# Patient Record
Sex: Male | Born: 1945
Health system: Southern US, Community
[De-identification: ages and names within clinical notes are randomized; demographics above are authoritative.]

## PROBLEM LIST (undated history)

## (undated) DIAGNOSIS — I219 Acute myocardial infarction, unspecified: Secondary | ICD-10-CM

## (undated) DIAGNOSIS — E785 Hyperlipidemia, unspecified: Secondary | ICD-10-CM

## (undated) DIAGNOSIS — E119 Type 2 diabetes mellitus without complications: Secondary | ICD-10-CM

## (undated) DIAGNOSIS — I10 Essential (primary) hypertension: Secondary | ICD-10-CM

## (undated) DIAGNOSIS — M1990 Unspecified osteoarthritis, unspecified site: Secondary | ICD-10-CM

## (undated) DIAGNOSIS — F32A Depression, unspecified: Secondary | ICD-10-CM

## (undated) DIAGNOSIS — I2510 Atherosclerotic heart disease of native coronary artery without angina pectoris: Secondary | ICD-10-CM

## (undated) DIAGNOSIS — I213 ST elevation (STEMI) myocardial infarction of unspecified site: Secondary | ICD-10-CM

## (undated) HISTORY — PX: SQUAMOUS CELL CARCINOMA EXCISION: SHX2433

## (undated) HISTORY — PX: CARDIAC CATHETERIZATION: SHX172

## (undated) HISTORY — DX: ST elevation (STEMI) myocardial infarction of unspecified site: I21.3

## (undated) HISTORY — DX: Acute myocardial infarction, unspecified: I21.9

---

## 2010-01-04 ENCOUNTER — Ambulatory Visit: Payer: Self-pay | Admitting: Cardiology

## 2011-12-22 ENCOUNTER — Ambulatory Visit: Payer: Self-pay | Admitting: Cardiology

## 2011-12-22 LAB — CBC WITH DIFFERENTIAL/PLATELET
Basophil #: 0 10*3/uL (ref 0.0–0.1)
Basophil %: 0.2 %
Eosinophil %: 3.8 %
Lymphocyte #: 1.9 10*3/uL (ref 1.0–3.6)
Lymphocyte %: 40 %
MCH: 29.2 pg (ref 26.0–34.0)
MCHC: 32.8 g/dL (ref 32.0–36.0)
MCV: 89 fL (ref 80–100)
Neutrophil %: 47.4 %
Platelet: 184 10*3/uL (ref 150–440)
RBC: 4.51 10*6/uL (ref 4.40–5.90)
WBC: 4.6 10*3/uL (ref 3.8–10.6)

## 2011-12-22 LAB — BASIC METABOLIC PANEL
Anion Gap: 4 — ABNORMAL LOW (ref 7–16)
BUN: 16 mg/dL (ref 7–18)
Calcium, Total: 8.3 mg/dL — ABNORMAL LOW (ref 8.5–10.1)
Co2: 29 mmol/L (ref 21–32)
EGFR (African American): >60
EGFR (Non-African Amer.): >60
Potassium: 4.4 mmol/L (ref 3.5–5.1)
Sodium: 142 mmol/L (ref 136–145)

## 2014-04-10 DIAGNOSIS — E782 Mixed hyperlipidemia: Secondary | ICD-10-CM | POA: Insufficient documentation

## 2014-07-22 DIAGNOSIS — L4050 Arthropathic psoriasis, unspecified: Secondary | ICD-10-CM | POA: Diagnosis not present

## 2014-07-22 DIAGNOSIS — Z79899 Other long term (current) drug therapy: Secondary | ICD-10-CM | POA: Diagnosis not present

## 2014-10-09 DIAGNOSIS — I10 Essential (primary) hypertension: Secondary | ICD-10-CM | POA: Diagnosis not present

## 2014-10-09 DIAGNOSIS — E782 Mixed hyperlipidemia: Secondary | ICD-10-CM | POA: Diagnosis not present

## 2014-10-09 DIAGNOSIS — E119 Type 2 diabetes mellitus without complications: Secondary | ICD-10-CM | POA: Diagnosis not present

## 2014-10-09 DIAGNOSIS — Z125 Encounter for screening for malignant neoplasm of prostate: Secondary | ICD-10-CM | POA: Diagnosis not present

## 2014-10-09 DIAGNOSIS — I2510 Atherosclerotic heart disease of native coronary artery without angina pectoris: Secondary | ICD-10-CM | POA: Diagnosis not present

## 2014-10-14 DIAGNOSIS — L4050 Arthropathic psoriasis, unspecified: Secondary | ICD-10-CM | POA: Diagnosis not present

## 2014-10-14 DIAGNOSIS — Z79899 Other long term (current) drug therapy: Secondary | ICD-10-CM | POA: Diagnosis not present

## 2014-10-15 DIAGNOSIS — Z0000 Encounter for general adult medical examination without abnormal findings: Secondary | ICD-10-CM | POA: Diagnosis not present

## 2014-10-15 DIAGNOSIS — E119 Type 2 diabetes mellitus without complications: Secondary | ICD-10-CM | POA: Diagnosis not present

## 2014-10-15 DIAGNOSIS — E782 Mixed hyperlipidemia: Secondary | ICD-10-CM | POA: Diagnosis not present

## 2014-10-21 DIAGNOSIS — L4050 Arthropathic psoriasis, unspecified: Secondary | ICD-10-CM | POA: Diagnosis not present

## 2014-10-21 DIAGNOSIS — Z79899 Other long term (current) drug therapy: Secondary | ICD-10-CM | POA: Diagnosis not present

## 2014-12-02 DIAGNOSIS — D2262 Melanocytic nevi of left upper limb, including shoulder: Secondary | ICD-10-CM | POA: Diagnosis not present

## 2014-12-02 DIAGNOSIS — D225 Melanocytic nevi of trunk: Secondary | ICD-10-CM | POA: Diagnosis not present

## 2014-12-02 DIAGNOSIS — L570 Actinic keratosis: Secondary | ICD-10-CM | POA: Diagnosis not present

## 2014-12-02 DIAGNOSIS — B3742 Candidal balanitis: Secondary | ICD-10-CM | POA: Diagnosis not present

## 2014-12-02 DIAGNOSIS — X32XXXA Exposure to sunlight, initial encounter: Secondary | ICD-10-CM | POA: Diagnosis not present

## 2014-12-02 DIAGNOSIS — L408 Other psoriasis: Secondary | ICD-10-CM | POA: Diagnosis not present

## 2015-02-10 DIAGNOSIS — L4050 Arthropathic psoriasis, unspecified: Secondary | ICD-10-CM | POA: Diagnosis not present

## 2015-02-10 DIAGNOSIS — M545 Low back pain: Secondary | ICD-10-CM | POA: Diagnosis not present

## 2015-03-26 DIAGNOSIS — H524 Presbyopia: Secondary | ICD-10-CM | POA: Diagnosis not present

## 2015-03-26 DIAGNOSIS — E11329 Type 2 diabetes mellitus with mild nonproliferative diabetic retinopathy without macular edema: Secondary | ICD-10-CM | POA: Diagnosis not present

## 2015-03-26 DIAGNOSIS — H5210 Myopia, unspecified eye: Secondary | ICD-10-CM | POA: Diagnosis not present

## 2015-04-03 DIAGNOSIS — Z23 Encounter for immunization: Secondary | ICD-10-CM | POA: Diagnosis not present

## 2015-04-10 DIAGNOSIS — E119 Type 2 diabetes mellitus without complications: Secondary | ICD-10-CM | POA: Diagnosis not present

## 2015-04-10 DIAGNOSIS — Z0000 Encounter for general adult medical examination without abnormal findings: Secondary | ICD-10-CM | POA: Diagnosis not present

## 2015-04-10 DIAGNOSIS — E782 Mixed hyperlipidemia: Secondary | ICD-10-CM | POA: Diagnosis not present

## 2015-04-17 DIAGNOSIS — E119 Type 2 diabetes mellitus without complications: Secondary | ICD-10-CM | POA: Diagnosis not present

## 2015-04-17 DIAGNOSIS — I2510 Atherosclerotic heart disease of native coronary artery without angina pectoris: Secondary | ICD-10-CM | POA: Diagnosis not present

## 2015-04-17 DIAGNOSIS — E782 Mixed hyperlipidemia: Secondary | ICD-10-CM | POA: Diagnosis not present

## 2015-04-17 DIAGNOSIS — R5382 Chronic fatigue, unspecified: Secondary | ICD-10-CM | POA: Diagnosis not present

## 2015-04-17 DIAGNOSIS — E538 Deficiency of other specified B group vitamins: Secondary | ICD-10-CM | POA: Diagnosis not present

## 2015-06-15 DIAGNOSIS — E538 Deficiency of other specified B group vitamins: Secondary | ICD-10-CM | POA: Diagnosis not present

## 2015-06-15 DIAGNOSIS — Z8639 Personal history of other endocrine, nutritional and metabolic disease: Secondary | ICD-10-CM | POA: Diagnosis not present

## 2015-07-23 DIAGNOSIS — Z79899 Other long term (current) drug therapy: Secondary | ICD-10-CM | POA: Diagnosis not present

## 2015-07-23 DIAGNOSIS — M25531 Pain in right wrist: Secondary | ICD-10-CM | POA: Diagnosis not present

## 2015-07-23 DIAGNOSIS — M19031 Primary osteoarthritis, right wrist: Secondary | ICD-10-CM | POA: Diagnosis not present

## 2015-07-23 DIAGNOSIS — L4050 Arthropathic psoriasis, unspecified: Secondary | ICD-10-CM | POA: Diagnosis not present

## 2015-08-20 DIAGNOSIS — Z79899 Other long term (current) drug therapy: Secondary | ICD-10-CM | POA: Diagnosis not present

## 2015-08-20 DIAGNOSIS — L4050 Arthropathic psoriasis, unspecified: Secondary | ICD-10-CM | POA: Diagnosis not present

## 2015-08-24 DIAGNOSIS — G8929 Other chronic pain: Secondary | ICD-10-CM | POA: Diagnosis not present

## 2015-08-24 DIAGNOSIS — L4050 Arthropathic psoriasis, unspecified: Secondary | ICD-10-CM | POA: Diagnosis not present

## 2015-08-24 DIAGNOSIS — M25512 Pain in left shoulder: Secondary | ICD-10-CM | POA: Diagnosis not present

## 2015-08-24 DIAGNOSIS — M79641 Pain in right hand: Secondary | ICD-10-CM | POA: Diagnosis not present

## 2015-08-24 DIAGNOSIS — Z79899 Other long term (current) drug therapy: Secondary | ICD-10-CM | POA: Diagnosis not present

## 2015-10-12 DIAGNOSIS — Z125 Encounter for screening for malignant neoplasm of prostate: Secondary | ICD-10-CM | POA: Diagnosis not present

## 2015-10-12 DIAGNOSIS — E119 Type 2 diabetes mellitus without complications: Secondary | ICD-10-CM | POA: Diagnosis not present

## 2015-10-12 DIAGNOSIS — E538 Deficiency of other specified B group vitamins: Secondary | ICD-10-CM | POA: Diagnosis not present

## 2015-10-12 DIAGNOSIS — E782 Mixed hyperlipidemia: Secondary | ICD-10-CM | POA: Diagnosis not present

## 2015-10-19 DIAGNOSIS — F3341 Major depressive disorder, recurrent, in partial remission: Secondary | ICD-10-CM | POA: Diagnosis not present

## 2015-10-19 DIAGNOSIS — E538 Deficiency of other specified B group vitamins: Secondary | ICD-10-CM | POA: Insufficient documentation

## 2015-10-19 DIAGNOSIS — E782 Mixed hyperlipidemia: Secondary | ICD-10-CM | POA: Diagnosis not present

## 2015-10-19 DIAGNOSIS — E119 Type 2 diabetes mellitus without complications: Secondary | ICD-10-CM | POA: Diagnosis not present

## 2015-10-19 DIAGNOSIS — Z0000 Encounter for general adult medical examination without abnormal findings: Secondary | ICD-10-CM | POA: Diagnosis not present

## 2015-11-16 DIAGNOSIS — Z79899 Other long term (current) drug therapy: Secondary | ICD-10-CM | POA: Diagnosis not present

## 2015-11-16 DIAGNOSIS — L4050 Arthropathic psoriasis, unspecified: Secondary | ICD-10-CM | POA: Diagnosis not present

## 2015-12-01 DIAGNOSIS — D485 Neoplasm of uncertain behavior of skin: Secondary | ICD-10-CM | POA: Diagnosis not present

## 2015-12-01 DIAGNOSIS — D225 Melanocytic nevi of trunk: Secondary | ICD-10-CM | POA: Diagnosis not present

## 2015-12-01 DIAGNOSIS — D2262 Melanocytic nevi of left upper limb, including shoulder: Secondary | ICD-10-CM | POA: Diagnosis not present

## 2015-12-01 DIAGNOSIS — D2271 Melanocytic nevi of right lower limb, including hip: Secondary | ICD-10-CM | POA: Diagnosis not present

## 2015-12-01 DIAGNOSIS — D2272 Melanocytic nevi of left lower limb, including hip: Secondary | ICD-10-CM | POA: Diagnosis not present

## 2015-12-01 DIAGNOSIS — X32XXXA Exposure to sunlight, initial encounter: Secondary | ICD-10-CM | POA: Diagnosis not present

## 2015-12-01 DIAGNOSIS — L570 Actinic keratosis: Secondary | ICD-10-CM | POA: Diagnosis not present

## 2015-12-01 DIAGNOSIS — D0421 Carcinoma in situ of skin of right ear and external auricular canal: Secondary | ICD-10-CM | POA: Diagnosis not present

## 2015-12-14 DIAGNOSIS — L570 Actinic keratosis: Secondary | ICD-10-CM | POA: Diagnosis not present

## 2015-12-14 DIAGNOSIS — D0421 Carcinoma in situ of skin of right ear and external auricular canal: Secondary | ICD-10-CM | POA: Diagnosis not present

## 2016-02-15 DIAGNOSIS — L4050 Arthropathic psoriasis, unspecified: Secondary | ICD-10-CM | POA: Diagnosis not present

## 2016-02-15 DIAGNOSIS — Z79899 Other long term (current) drug therapy: Secondary | ICD-10-CM | POA: Diagnosis not present

## 2016-02-22 DIAGNOSIS — G8929 Other chronic pain: Secondary | ICD-10-CM | POA: Diagnosis not present

## 2016-02-22 DIAGNOSIS — M545 Low back pain: Secondary | ICD-10-CM | POA: Diagnosis not present

## 2016-02-22 DIAGNOSIS — L4050 Arthropathic psoriasis, unspecified: Secondary | ICD-10-CM | POA: Diagnosis not present

## 2016-04-12 DIAGNOSIS — E119 Type 2 diabetes mellitus without complications: Secondary | ICD-10-CM | POA: Diagnosis not present

## 2016-04-12 DIAGNOSIS — E782 Mixed hyperlipidemia: Secondary | ICD-10-CM | POA: Diagnosis not present

## 2016-04-12 DIAGNOSIS — Z0000 Encounter for general adult medical examination without abnormal findings: Secondary | ICD-10-CM | POA: Diagnosis not present

## 2016-04-12 DIAGNOSIS — E538 Deficiency of other specified B group vitamins: Secondary | ICD-10-CM | POA: Diagnosis not present

## 2016-04-14 DIAGNOSIS — X32XXXA Exposure to sunlight, initial encounter: Secondary | ICD-10-CM | POA: Diagnosis not present

## 2016-04-14 DIAGNOSIS — D225 Melanocytic nevi of trunk: Secondary | ICD-10-CM | POA: Diagnosis not present

## 2016-04-14 DIAGNOSIS — L923 Foreign body granuloma of the skin and subcutaneous tissue: Secondary | ICD-10-CM | POA: Diagnosis not present

## 2016-04-14 DIAGNOSIS — Z85828 Personal history of other malignant neoplasm of skin: Secondary | ICD-10-CM | POA: Diagnosis not present

## 2016-04-14 DIAGNOSIS — D2261 Melanocytic nevi of right upper limb, including shoulder: Secondary | ICD-10-CM | POA: Diagnosis not present

## 2016-04-14 DIAGNOSIS — L570 Actinic keratosis: Secondary | ICD-10-CM | POA: Diagnosis not present

## 2016-04-19 DIAGNOSIS — E119 Type 2 diabetes mellitus without complications: Secondary | ICD-10-CM | POA: Diagnosis not present

## 2016-04-19 DIAGNOSIS — Z23 Encounter for immunization: Secondary | ICD-10-CM | POA: Diagnosis not present

## 2016-05-10 DIAGNOSIS — L4050 Arthropathic psoriasis, unspecified: Secondary | ICD-10-CM | POA: Diagnosis not present

## 2016-08-05 DIAGNOSIS — J40 Bronchitis, not specified as acute or chronic: Secondary | ICD-10-CM | POA: Diagnosis not present

## 2016-08-09 DIAGNOSIS — L4050 Arthropathic psoriasis, unspecified: Secondary | ICD-10-CM | POA: Diagnosis not present

## 2016-08-24 DIAGNOSIS — Z79899 Other long term (current) drug therapy: Secondary | ICD-10-CM | POA: Diagnosis not present

## 2016-08-24 DIAGNOSIS — G8929 Other chronic pain: Secondary | ICD-10-CM | POA: Diagnosis not present

## 2016-08-24 DIAGNOSIS — L4050 Arthropathic psoriasis, unspecified: Secondary | ICD-10-CM | POA: Diagnosis not present

## 2016-08-24 DIAGNOSIS — M545 Low back pain: Secondary | ICD-10-CM | POA: Diagnosis not present

## 2016-10-20 DIAGNOSIS — E119 Type 2 diabetes mellitus without complications: Secondary | ICD-10-CM | POA: Diagnosis not present

## 2016-10-20 DIAGNOSIS — Z125 Encounter for screening for malignant neoplasm of prostate: Secondary | ICD-10-CM | POA: Diagnosis not present

## 2016-10-21 DIAGNOSIS — D2261 Melanocytic nevi of right upper limb, including shoulder: Secondary | ICD-10-CM | POA: Diagnosis not present

## 2016-10-21 DIAGNOSIS — Z85828 Personal history of other malignant neoplasm of skin: Secondary | ICD-10-CM | POA: Diagnosis not present

## 2016-10-21 DIAGNOSIS — B079 Viral wart, unspecified: Secondary | ICD-10-CM | POA: Diagnosis not present

## 2016-10-21 DIAGNOSIS — D2272 Melanocytic nevi of left lower limb, including hip: Secondary | ICD-10-CM | POA: Diagnosis not present

## 2016-10-21 DIAGNOSIS — X32XXXA Exposure to sunlight, initial encounter: Secondary | ICD-10-CM | POA: Diagnosis not present

## 2016-10-21 DIAGNOSIS — L570 Actinic keratosis: Secondary | ICD-10-CM | POA: Diagnosis not present

## 2016-10-21 DIAGNOSIS — D485 Neoplasm of uncertain behavior of skin: Secondary | ICD-10-CM | POA: Diagnosis not present

## 2016-10-21 DIAGNOSIS — D225 Melanocytic nevi of trunk: Secondary | ICD-10-CM | POA: Diagnosis not present

## 2016-10-27 DIAGNOSIS — E538 Deficiency of other specified B group vitamins: Secondary | ICD-10-CM | POA: Diagnosis not present

## 2016-10-27 DIAGNOSIS — E782 Mixed hyperlipidemia: Secondary | ICD-10-CM | POA: Diagnosis not present

## 2016-10-27 DIAGNOSIS — F3341 Major depressive disorder, recurrent, in partial remission: Secondary | ICD-10-CM | POA: Diagnosis not present

## 2016-10-27 DIAGNOSIS — E119 Type 2 diabetes mellitus without complications: Secondary | ICD-10-CM | POA: Diagnosis not present

## 2016-10-27 DIAGNOSIS — Z0000 Encounter for general adult medical examination without abnormal findings: Secondary | ICD-10-CM | POA: Diagnosis not present

## 2016-11-30 DIAGNOSIS — Z79899 Other long term (current) drug therapy: Secondary | ICD-10-CM | POA: Diagnosis not present

## 2016-11-30 DIAGNOSIS — L4050 Arthropathic psoriasis, unspecified: Secondary | ICD-10-CM | POA: Diagnosis not present

## 2017-02-23 DIAGNOSIS — Z79899 Other long term (current) drug therapy: Secondary | ICD-10-CM | POA: Diagnosis not present

## 2017-02-23 DIAGNOSIS — L4050 Arthropathic psoriasis, unspecified: Secondary | ICD-10-CM | POA: Diagnosis not present

## 2017-04-25 DIAGNOSIS — E119 Type 2 diabetes mellitus without complications: Secondary | ICD-10-CM | POA: Diagnosis not present

## 2017-04-25 DIAGNOSIS — E538 Deficiency of other specified B group vitamins: Secondary | ICD-10-CM | POA: Diagnosis not present

## 2017-04-25 DIAGNOSIS — E782 Mixed hyperlipidemia: Secondary | ICD-10-CM | POA: Diagnosis not present

## 2017-05-02 DIAGNOSIS — E538 Deficiency of other specified B group vitamins: Secondary | ICD-10-CM | POA: Diagnosis not present

## 2017-05-02 DIAGNOSIS — Z23 Encounter for immunization: Secondary | ICD-10-CM | POA: Diagnosis not present

## 2017-05-02 DIAGNOSIS — E782 Mixed hyperlipidemia: Secondary | ICD-10-CM | POA: Diagnosis not present

## 2017-05-02 DIAGNOSIS — E113393 Type 2 diabetes mellitus with moderate nonproliferative diabetic retinopathy without macular edema, bilateral: Secondary | ICD-10-CM | POA: Diagnosis not present

## 2017-05-02 DIAGNOSIS — H524 Presbyopia: Secondary | ICD-10-CM | POA: Diagnosis not present

## 2017-05-02 DIAGNOSIS — Z125 Encounter for screening for malignant neoplasm of prostate: Secondary | ICD-10-CM | POA: Diagnosis not present

## 2017-05-02 DIAGNOSIS — E1169 Type 2 diabetes mellitus with other specified complication: Secondary | ICD-10-CM | POA: Diagnosis not present

## 2017-05-23 DIAGNOSIS — L4050 Arthropathic psoriasis, unspecified: Secondary | ICD-10-CM | POA: Diagnosis not present

## 2017-05-23 DIAGNOSIS — Z79899 Other long term (current) drug therapy: Secondary | ICD-10-CM | POA: Diagnosis not present

## 2017-05-31 DIAGNOSIS — M150 Primary generalized (osteo)arthritis: Secondary | ICD-10-CM | POA: Diagnosis not present

## 2017-05-31 DIAGNOSIS — Z79899 Other long term (current) drug therapy: Secondary | ICD-10-CM | POA: Diagnosis not present

## 2017-05-31 DIAGNOSIS — L4050 Arthropathic psoriasis, unspecified: Secondary | ICD-10-CM | POA: Diagnosis not present

## 2017-07-21 DIAGNOSIS — Z85828 Personal history of other malignant neoplasm of skin: Secondary | ICD-10-CM | POA: Diagnosis not present

## 2017-07-21 DIAGNOSIS — Z08 Encounter for follow-up examination after completed treatment for malignant neoplasm: Secondary | ICD-10-CM | POA: Diagnosis not present

## 2017-07-21 DIAGNOSIS — X32XXXA Exposure to sunlight, initial encounter: Secondary | ICD-10-CM | POA: Diagnosis not present

## 2017-07-21 DIAGNOSIS — L570 Actinic keratosis: Secondary | ICD-10-CM | POA: Diagnosis not present

## 2017-09-04 DIAGNOSIS — M150 Primary generalized (osteo)arthritis: Secondary | ICD-10-CM | POA: Diagnosis not present

## 2017-09-04 DIAGNOSIS — L4050 Arthropathic psoriasis, unspecified: Secondary | ICD-10-CM | POA: Diagnosis not present

## 2017-09-04 DIAGNOSIS — Z79899 Other long term (current) drug therapy: Secondary | ICD-10-CM | POA: Diagnosis not present

## 2017-10-24 DIAGNOSIS — E782 Mixed hyperlipidemia: Secondary | ICD-10-CM | POA: Diagnosis not present

## 2017-10-24 DIAGNOSIS — Z125 Encounter for screening for malignant neoplasm of prostate: Secondary | ICD-10-CM | POA: Diagnosis not present

## 2017-10-24 DIAGNOSIS — E538 Deficiency of other specified B group vitamins: Secondary | ICD-10-CM | POA: Diagnosis not present

## 2017-10-24 DIAGNOSIS — E1169 Type 2 diabetes mellitus with other specified complication: Secondary | ICD-10-CM | POA: Diagnosis not present

## 2017-10-31 DIAGNOSIS — F3341 Major depressive disorder, recurrent, in partial remission: Secondary | ICD-10-CM | POA: Diagnosis not present

## 2017-10-31 DIAGNOSIS — E538 Deficiency of other specified B group vitamins: Secondary | ICD-10-CM | POA: Diagnosis not present

## 2017-10-31 DIAGNOSIS — E782 Mixed hyperlipidemia: Secondary | ICD-10-CM | POA: Diagnosis not present

## 2017-10-31 DIAGNOSIS — Z0000 Encounter for general adult medical examination without abnormal findings: Secondary | ICD-10-CM | POA: Diagnosis not present

## 2017-10-31 DIAGNOSIS — L4050 Arthropathic psoriasis, unspecified: Secondary | ICD-10-CM | POA: Diagnosis not present

## 2017-10-31 DIAGNOSIS — E1169 Type 2 diabetes mellitus with other specified complication: Secondary | ICD-10-CM | POA: Diagnosis not present

## 2017-11-22 DIAGNOSIS — M150 Primary generalized (osteo)arthritis: Secondary | ICD-10-CM | POA: Diagnosis not present

## 2017-11-22 DIAGNOSIS — Z79899 Other long term (current) drug therapy: Secondary | ICD-10-CM | POA: Diagnosis not present

## 2017-11-22 DIAGNOSIS — L4050 Arthropathic psoriasis, unspecified: Secondary | ICD-10-CM | POA: Diagnosis not present

## 2017-11-29 DIAGNOSIS — M150 Primary generalized (osteo)arthritis: Secondary | ICD-10-CM | POA: Diagnosis not present

## 2017-11-29 DIAGNOSIS — L4050 Arthropathic psoriasis, unspecified: Secondary | ICD-10-CM | POA: Diagnosis not present

## 2017-11-29 DIAGNOSIS — Z79899 Other long term (current) drug therapy: Secondary | ICD-10-CM | POA: Diagnosis not present

## 2018-02-03 DIAGNOSIS — M1711 Unilateral primary osteoarthritis, right knee: Secondary | ICD-10-CM | POA: Diagnosis not present

## 2018-03-01 DIAGNOSIS — Z79899 Other long term (current) drug therapy: Secondary | ICD-10-CM | POA: Diagnosis not present

## 2018-03-01 DIAGNOSIS — L4050 Arthropathic psoriasis, unspecified: Secondary | ICD-10-CM | POA: Diagnosis not present

## 2018-04-26 DIAGNOSIS — Z23 Encounter for immunization: Secondary | ICD-10-CM | POA: Diagnosis not present

## 2018-04-26 DIAGNOSIS — E1169 Type 2 diabetes mellitus with other specified complication: Secondary | ICD-10-CM | POA: Diagnosis not present

## 2018-04-26 DIAGNOSIS — E538 Deficiency of other specified B group vitamins: Secondary | ICD-10-CM | POA: Diagnosis not present

## 2018-04-26 DIAGNOSIS — E782 Mixed hyperlipidemia: Secondary | ICD-10-CM | POA: Diagnosis not present

## 2018-05-03 DIAGNOSIS — E1151 Type 2 diabetes mellitus with diabetic peripheral angiopathy without gangrene: Secondary | ICD-10-CM | POA: Insufficient documentation

## 2018-05-03 DIAGNOSIS — Z125 Encounter for screening for malignant neoplasm of prostate: Secondary | ICD-10-CM | POA: Diagnosis not present

## 2018-05-03 DIAGNOSIS — E538 Deficiency of other specified B group vitamins: Secondary | ICD-10-CM | POA: Diagnosis not present

## 2018-05-03 DIAGNOSIS — E782 Mixed hyperlipidemia: Secondary | ICD-10-CM | POA: Diagnosis not present

## 2018-05-31 DIAGNOSIS — Z79899 Other long term (current) drug therapy: Secondary | ICD-10-CM | POA: Diagnosis not present

## 2018-05-31 DIAGNOSIS — L4050 Arthropathic psoriasis, unspecified: Secondary | ICD-10-CM | POA: Diagnosis not present

## 2018-07-20 DIAGNOSIS — D2262 Melanocytic nevi of left upper limb, including shoulder: Secondary | ICD-10-CM | POA: Diagnosis not present

## 2018-07-20 DIAGNOSIS — D485 Neoplasm of uncertain behavior of skin: Secondary | ICD-10-CM | POA: Diagnosis not present

## 2018-07-20 DIAGNOSIS — D225 Melanocytic nevi of trunk: Secondary | ICD-10-CM | POA: Diagnosis not present

## 2018-07-20 DIAGNOSIS — D2271 Melanocytic nevi of right lower limb, including hip: Secondary | ICD-10-CM | POA: Diagnosis not present

## 2018-07-20 DIAGNOSIS — Z85828 Personal history of other malignant neoplasm of skin: Secondary | ICD-10-CM | POA: Diagnosis not present

## 2018-07-20 DIAGNOSIS — D0439 Carcinoma in situ of skin of other parts of face: Secondary | ICD-10-CM | POA: Diagnosis not present

## 2018-07-20 DIAGNOSIS — Z08 Encounter for follow-up examination after completed treatment for malignant neoplasm: Secondary | ICD-10-CM | POA: Diagnosis not present

## 2018-07-20 DIAGNOSIS — D2261 Melanocytic nevi of right upper limb, including shoulder: Secondary | ICD-10-CM | POA: Diagnosis not present

## 2018-07-20 DIAGNOSIS — D2272 Melanocytic nevi of left lower limb, including hip: Secondary | ICD-10-CM | POA: Diagnosis not present

## 2018-07-20 DIAGNOSIS — L570 Actinic keratosis: Secondary | ICD-10-CM | POA: Diagnosis not present

## 2018-07-30 DIAGNOSIS — D0439 Carcinoma in situ of skin of other parts of face: Secondary | ICD-10-CM | POA: Diagnosis not present

## 2018-07-30 DIAGNOSIS — C4442 Squamous cell carcinoma of skin of scalp and neck: Secondary | ICD-10-CM | POA: Diagnosis not present

## 2018-08-30 DIAGNOSIS — Z79899 Other long term (current) drug therapy: Secondary | ICD-10-CM | POA: Diagnosis not present

## 2018-08-30 DIAGNOSIS — L4050 Arthropathic psoriasis, unspecified: Secondary | ICD-10-CM | POA: Diagnosis not present

## 2018-09-06 DIAGNOSIS — Z79899 Other long term (current) drug therapy: Secondary | ICD-10-CM | POA: Diagnosis not present

## 2018-09-06 DIAGNOSIS — M150 Primary generalized (osteo)arthritis: Secondary | ICD-10-CM | POA: Diagnosis not present

## 2018-09-06 DIAGNOSIS — L4050 Arthropathic psoriasis, unspecified: Secondary | ICD-10-CM | POA: Diagnosis not present

## 2018-10-29 DIAGNOSIS — E538 Deficiency of other specified B group vitamins: Secondary | ICD-10-CM | POA: Diagnosis not present

## 2018-10-29 DIAGNOSIS — Z125 Encounter for screening for malignant neoplasm of prostate: Secondary | ICD-10-CM | POA: Diagnosis not present

## 2018-10-29 DIAGNOSIS — E1151 Type 2 diabetes mellitus with diabetic peripheral angiopathy without gangrene: Secondary | ICD-10-CM | POA: Diagnosis not present

## 2018-10-29 DIAGNOSIS — E782 Mixed hyperlipidemia: Secondary | ICD-10-CM | POA: Diagnosis not present

## 2018-11-05 DIAGNOSIS — E538 Deficiency of other specified B group vitamins: Secondary | ICD-10-CM | POA: Diagnosis not present

## 2018-11-05 DIAGNOSIS — L4050 Arthropathic psoriasis, unspecified: Secondary | ICD-10-CM | POA: Diagnosis not present

## 2018-11-05 DIAGNOSIS — F3341 Major depressive disorder, recurrent, in partial remission: Secondary | ICD-10-CM | POA: Diagnosis not present

## 2018-11-05 DIAGNOSIS — Z0000 Encounter for general adult medical examination without abnormal findings: Secondary | ICD-10-CM | POA: Diagnosis not present

## 2018-11-05 DIAGNOSIS — E1151 Type 2 diabetes mellitus with diabetic peripheral angiopathy without gangrene: Secondary | ICD-10-CM | POA: Diagnosis not present

## 2018-11-29 DIAGNOSIS — D2261 Melanocytic nevi of right upper limb, including shoulder: Secondary | ICD-10-CM | POA: Diagnosis not present

## 2018-11-29 DIAGNOSIS — M8949 Other hypertrophic osteoarthropathy, multiple sites: Secondary | ICD-10-CM | POA: Diagnosis not present

## 2018-11-29 DIAGNOSIS — D2272 Melanocytic nevi of left lower limb, including hip: Secondary | ICD-10-CM | POA: Diagnosis not present

## 2018-11-29 DIAGNOSIS — L570 Actinic keratosis: Secondary | ICD-10-CM | POA: Diagnosis not present

## 2018-11-29 DIAGNOSIS — D485 Neoplasm of uncertain behavior of skin: Secondary | ICD-10-CM | POA: Diagnosis not present

## 2018-11-29 DIAGNOSIS — L820 Inflamed seborrheic keratosis: Secondary | ICD-10-CM | POA: Diagnosis not present

## 2018-11-29 DIAGNOSIS — L4050 Arthropathic psoriasis, unspecified: Secondary | ICD-10-CM | POA: Diagnosis not present

## 2018-11-29 DIAGNOSIS — Z79899 Other long term (current) drug therapy: Secondary | ICD-10-CM | POA: Diagnosis not present

## 2018-11-29 DIAGNOSIS — Z08 Encounter for follow-up examination after completed treatment for malignant neoplasm: Secondary | ICD-10-CM | POA: Diagnosis not present

## 2018-11-29 DIAGNOSIS — Z85828 Personal history of other malignant neoplasm of skin: Secondary | ICD-10-CM | POA: Diagnosis not present

## 2018-11-29 DIAGNOSIS — D2262 Melanocytic nevi of left upper limb, including shoulder: Secondary | ICD-10-CM | POA: Diagnosis not present

## 2019-01-10 DIAGNOSIS — L570 Actinic keratosis: Secondary | ICD-10-CM | POA: Diagnosis not present

## 2019-02-28 DIAGNOSIS — L4050 Arthropathic psoriasis, unspecified: Secondary | ICD-10-CM | POA: Diagnosis not present

## 2019-02-28 DIAGNOSIS — Z79899 Other long term (current) drug therapy: Secondary | ICD-10-CM | POA: Diagnosis not present

## 2019-02-28 DIAGNOSIS — M8949 Other hypertrophic osteoarthropathy, multiple sites: Secondary | ICD-10-CM | POA: Diagnosis not present

## 2019-03-11 DIAGNOSIS — Z79899 Other long term (current) drug therapy: Secondary | ICD-10-CM | POA: Diagnosis not present

## 2019-03-11 DIAGNOSIS — L4050 Arthropathic psoriasis, unspecified: Secondary | ICD-10-CM | POA: Diagnosis not present

## 2019-03-11 DIAGNOSIS — M8949 Other hypertrophic osteoarthropathy, multiple sites: Secondary | ICD-10-CM | POA: Diagnosis not present

## 2019-03-26 DIAGNOSIS — Z23 Encounter for immunization: Secondary | ICD-10-CM | POA: Diagnosis not present

## 2019-05-03 DIAGNOSIS — E538 Deficiency of other specified B group vitamins: Secondary | ICD-10-CM | POA: Diagnosis not present

## 2019-05-03 DIAGNOSIS — E1151 Type 2 diabetes mellitus with diabetic peripheral angiopathy without gangrene: Secondary | ICD-10-CM | POA: Diagnosis not present

## 2019-05-10 DIAGNOSIS — L4050 Arthropathic psoriasis, unspecified: Secondary | ICD-10-CM | POA: Diagnosis not present

## 2019-05-10 DIAGNOSIS — E1165 Type 2 diabetes mellitus with hyperglycemia: Secondary | ICD-10-CM | POA: Diagnosis not present

## 2019-05-10 DIAGNOSIS — Z7984 Long term (current) use of oral hypoglycemic drugs: Secondary | ICD-10-CM | POA: Diagnosis not present

## 2019-05-10 DIAGNOSIS — I2510 Atherosclerotic heart disease of native coronary artery without angina pectoris: Secondary | ICD-10-CM | POA: Diagnosis not present

## 2019-05-10 DIAGNOSIS — I10 Essential (primary) hypertension: Secondary | ICD-10-CM | POA: Diagnosis not present

## 2019-06-10 DIAGNOSIS — M8949 Other hypertrophic osteoarthropathy, multiple sites: Secondary | ICD-10-CM | POA: Diagnosis not present

## 2019-06-10 DIAGNOSIS — Z79899 Other long term (current) drug therapy: Secondary | ICD-10-CM | POA: Diagnosis not present

## 2019-06-10 DIAGNOSIS — L4050 Arthropathic psoriasis, unspecified: Secondary | ICD-10-CM | POA: Diagnosis not present

## 2019-06-14 DIAGNOSIS — E1165 Type 2 diabetes mellitus with hyperglycemia: Secondary | ICD-10-CM | POA: Diagnosis not present

## 2019-06-28 HISTORY — PX: EYE SURGERY: SHX253

## 2019-07-24 ENCOUNTER — Ambulatory Visit: Payer: Self-pay

## 2019-08-02 ENCOUNTER — Ambulatory Visit: Payer: Medicare HMO | Attending: Internal Medicine

## 2019-08-02 DIAGNOSIS — Z23 Encounter for immunization: Secondary | ICD-10-CM | POA: Insufficient documentation

## 2019-08-02 NOTE — Progress Notes (Signed)
   Covid-19 Vaccination Clinic  Name:  Shane Ford    MRN: [id number] DOB: 04-01-1946  08/02/2019  [name] was observed post Covid-19 immunization for 15 minutes without incidence. He was provided with Vaccine Information Sheet and instruction to access the V-Safe system.   [name] was instructed to call 911 with any severe reactions post vaccine: Marland Kitchen Difficulty breathing  . Swelling of your face and throat  . A fast heartbeat  . A bad rash all over your body  . Dizziness and weakness    Immunizations Administered    Name Date Dose VIS Date Route   Pfizer COVID-19 Vaccine 08/02/2019  1:31 PM 0.3 mL 06/07/2019 Intramuscular   Manufacturer: [place]   Lot: [id number]   [place]: [id number]

## 2019-08-27 ENCOUNTER — Ambulatory Visit: Payer: Medicare HMO | Attending: Internal Medicine

## 2019-08-27 DIAGNOSIS — Z23 Encounter for immunization: Secondary | ICD-10-CM | POA: Insufficient documentation

## 2019-08-27 NOTE — Progress Notes (Signed)
   Covid-19 Vaccination Clinic  Name:  Rilen Jerauld    MRN: [id number] DOB: 05/05/1946  08/27/2019  Mr. Romain was observed post Covid-19 immunization for 15 minutes without incident. He was provided with Vaccine Information Sheet and instruction to access the V-Safe system.   Mr. Sayer was instructed to call 911 with any severe reactions post vaccine: Marland Kitchen Difficulty breathing  . Swelling of face and throat  . A fast heartbeat  . A bad rash all over body  . Dizziness and weakness   Immunizations Administered    Name Date Dose VIS Date Route   Pfizer COVID-19 Vaccine 08/27/2019  1:23 PM 0.3 mL 06/07/2019 Intramuscular   Manufacturer: [place]   Lot: [id number]   [place]: [id number]

## 2019-09-03 DIAGNOSIS — Z79899 Other long term (current) drug therapy: Secondary | ICD-10-CM | POA: Diagnosis not present

## 2019-09-03 DIAGNOSIS — M8949 Other hypertrophic osteoarthropathy, multiple sites: Secondary | ICD-10-CM | POA: Diagnosis not present

## 2019-09-03 DIAGNOSIS — L4050 Arthropathic psoriasis, unspecified: Secondary | ICD-10-CM | POA: Diagnosis not present

## 2019-09-11 DIAGNOSIS — D485 Neoplasm of uncertain behavior of skin: Secondary | ICD-10-CM | POA: Diagnosis not present

## 2019-09-11 DIAGNOSIS — L538 Other specified erythematous conditions: Secondary | ICD-10-CM | POA: Diagnosis not present

## 2019-09-11 DIAGNOSIS — D225 Melanocytic nevi of trunk: Secondary | ICD-10-CM | POA: Diagnosis not present

## 2019-09-11 DIAGNOSIS — Z85828 Personal history of other malignant neoplasm of skin: Secondary | ICD-10-CM | POA: Diagnosis not present

## 2019-09-11 DIAGNOSIS — L570 Actinic keratosis: Secondary | ICD-10-CM | POA: Diagnosis not present

## 2019-09-11 DIAGNOSIS — D2261 Melanocytic nevi of right upper limb, including shoulder: Secondary | ICD-10-CM | POA: Diagnosis not present

## 2019-09-11 DIAGNOSIS — X32XXXA Exposure to sunlight, initial encounter: Secondary | ICD-10-CM | POA: Diagnosis not present

## 2019-09-11 DIAGNOSIS — L820 Inflamed seborrheic keratosis: Secondary | ICD-10-CM | POA: Diagnosis not present

## 2019-09-11 DIAGNOSIS — D2271 Melanocytic nevi of right lower limb, including hip: Secondary | ICD-10-CM | POA: Diagnosis not present

## 2019-10-09 DIAGNOSIS — L570 Actinic keratosis: Secondary | ICD-10-CM | POA: Diagnosis not present

## 2019-11-01 DIAGNOSIS — Z125 Encounter for screening for malignant neoplasm of prostate: Secondary | ICD-10-CM | POA: Diagnosis not present

## 2019-11-01 DIAGNOSIS — E1151 Type 2 diabetes mellitus with diabetic peripheral angiopathy without gangrene: Secondary | ICD-10-CM | POA: Diagnosis not present

## 2019-11-07 DIAGNOSIS — I2510 Atherosclerotic heart disease of native coronary artery without angina pectoris: Secondary | ICD-10-CM | POA: Diagnosis not present

## 2019-11-07 DIAGNOSIS — E1151 Type 2 diabetes mellitus with diabetic peripheral angiopathy without gangrene: Secondary | ICD-10-CM | POA: Diagnosis not present

## 2019-11-07 DIAGNOSIS — E119 Type 2 diabetes mellitus without complications: Secondary | ICD-10-CM | POA: Diagnosis not present

## 2019-11-07 DIAGNOSIS — I10 Essential (primary) hypertension: Secondary | ICD-10-CM | POA: Diagnosis not present

## 2019-11-07 DIAGNOSIS — L4050 Arthropathic psoriasis, unspecified: Secondary | ICD-10-CM | POA: Diagnosis not present

## 2019-11-07 DIAGNOSIS — Z79899 Other long term (current) drug therapy: Secondary | ICD-10-CM | POA: Diagnosis not present

## 2019-11-07 DIAGNOSIS — Z0000 Encounter for general adult medical examination without abnormal findings: Secondary | ICD-10-CM | POA: Diagnosis not present

## 2019-11-20 DIAGNOSIS — E113393 Type 2 diabetes mellitus with moderate nonproliferative diabetic retinopathy without macular edema, bilateral: Secondary | ICD-10-CM | POA: Diagnosis not present

## 2019-12-02 DIAGNOSIS — Z79899 Other long term (current) drug therapy: Secondary | ICD-10-CM | POA: Diagnosis not present

## 2019-12-02 DIAGNOSIS — M8949 Other hypertrophic osteoarthropathy, multiple sites: Secondary | ICD-10-CM | POA: Diagnosis not present

## 2019-12-02 DIAGNOSIS — L4050 Arthropathic psoriasis, unspecified: Secondary | ICD-10-CM | POA: Diagnosis not present

## 2019-12-09 DIAGNOSIS — M8949 Other hypertrophic osteoarthropathy, multiple sites: Secondary | ICD-10-CM | POA: Diagnosis not present

## 2019-12-09 DIAGNOSIS — Z79899 Other long term (current) drug therapy: Secondary | ICD-10-CM | POA: Diagnosis not present

## 2019-12-09 DIAGNOSIS — M545 Low back pain: Secondary | ICD-10-CM | POA: Diagnosis not present

## 2019-12-09 DIAGNOSIS — M25512 Pain in left shoulder: Secondary | ICD-10-CM | POA: Diagnosis not present

## 2019-12-09 DIAGNOSIS — M79642 Pain in left hand: Secondary | ICD-10-CM | POA: Diagnosis not present

## 2019-12-09 DIAGNOSIS — L4050 Arthropathic psoriasis, unspecified: Secondary | ICD-10-CM | POA: Diagnosis not present

## 2019-12-09 DIAGNOSIS — G8929 Other chronic pain: Secondary | ICD-10-CM | POA: Diagnosis not present

## 2020-01-07 DIAGNOSIS — H2513 Age-related nuclear cataract, bilateral: Secondary | ICD-10-CM | POA: Diagnosis not present

## 2020-01-07 DIAGNOSIS — H2511 Age-related nuclear cataract, right eye: Secondary | ICD-10-CM | POA: Diagnosis not present

## 2020-01-07 DIAGNOSIS — H25013 Cortical age-related cataract, bilateral: Secondary | ICD-10-CM | POA: Diagnosis not present

## 2020-01-07 DIAGNOSIS — H18413 Arcus senilis, bilateral: Secondary | ICD-10-CM | POA: Diagnosis not present

## 2020-01-07 DIAGNOSIS — H25043 Posterior subcapsular polar age-related cataract, bilateral: Secondary | ICD-10-CM | POA: Diagnosis not present

## 2020-03-09 DIAGNOSIS — H2511 Age-related nuclear cataract, right eye: Secondary | ICD-10-CM | POA: Diagnosis not present

## 2020-03-10 DIAGNOSIS — H2512 Age-related nuclear cataract, left eye: Secondary | ICD-10-CM | POA: Diagnosis not present

## 2020-03-12 DIAGNOSIS — Z79899 Other long term (current) drug therapy: Secondary | ICD-10-CM | POA: Diagnosis not present

## 2020-03-12 DIAGNOSIS — M8949 Other hypertrophic osteoarthropathy, multiple sites: Secondary | ICD-10-CM | POA: Diagnosis not present

## 2020-03-23 DIAGNOSIS — H2512 Age-related nuclear cataract, left eye: Secondary | ICD-10-CM | POA: Diagnosis not present

## 2020-03-31 DIAGNOSIS — Z23 Encounter for immunization: Secondary | ICD-10-CM | POA: Diagnosis not present

## 2020-05-07 DIAGNOSIS — Z79899 Other long term (current) drug therapy: Secondary | ICD-10-CM | POA: Diagnosis not present

## 2020-05-07 DIAGNOSIS — E1151 Type 2 diabetes mellitus with diabetic peripheral angiopathy without gangrene: Secondary | ICD-10-CM | POA: Diagnosis not present

## 2020-05-07 DIAGNOSIS — E538 Deficiency of other specified B group vitamins: Secondary | ICD-10-CM | POA: Diagnosis not present

## 2020-05-11 DIAGNOSIS — Z961 Presence of intraocular lens: Secondary | ICD-10-CM | POA: Diagnosis not present

## 2020-05-11 DIAGNOSIS — E113393 Type 2 diabetes mellitus with moderate nonproliferative diabetic retinopathy without macular edema, bilateral: Secondary | ICD-10-CM | POA: Diagnosis not present

## 2020-05-12 DIAGNOSIS — I69393 Ataxia following cerebral infarction: Secondary | ICD-10-CM | POA: Diagnosis not present

## 2020-05-12 DIAGNOSIS — E119 Type 2 diabetes mellitus without complications: Secondary | ICD-10-CM | POA: Diagnosis not present

## 2020-05-12 DIAGNOSIS — G309 Alzheimer's disease, unspecified: Secondary | ICD-10-CM | POA: Diagnosis not present

## 2020-05-12 DIAGNOSIS — F0280 Dementia in other diseases classified elsewhere without behavioral disturbance: Secondary | ICD-10-CM | POA: Diagnosis not present

## 2020-05-12 DIAGNOSIS — Z125 Encounter for screening for malignant neoplasm of prostate: Secondary | ICD-10-CM | POA: Diagnosis not present

## 2020-05-13 DIAGNOSIS — L821 Other seborrheic keratosis: Secondary | ICD-10-CM | POA: Diagnosis not present

## 2020-05-13 DIAGNOSIS — D0439 Carcinoma in situ of skin of other parts of face: Secondary | ICD-10-CM | POA: Diagnosis not present

## 2020-05-13 DIAGNOSIS — D2262 Melanocytic nevi of left upper limb, including shoulder: Secondary | ICD-10-CM | POA: Diagnosis not present

## 2020-05-13 DIAGNOSIS — Z85828 Personal history of other malignant neoplasm of skin: Secondary | ICD-10-CM | POA: Diagnosis not present

## 2020-05-13 DIAGNOSIS — L570 Actinic keratosis: Secondary | ICD-10-CM | POA: Diagnosis not present

## 2020-05-13 DIAGNOSIS — D485 Neoplasm of uncertain behavior of skin: Secondary | ICD-10-CM | POA: Diagnosis not present

## 2020-05-13 DIAGNOSIS — D2271 Melanocytic nevi of right lower limb, including hip: Secondary | ICD-10-CM | POA: Diagnosis not present

## 2020-05-13 DIAGNOSIS — X32XXXA Exposure to sunlight, initial encounter: Secondary | ICD-10-CM | POA: Diagnosis not present

## 2020-05-13 DIAGNOSIS — D2261 Melanocytic nevi of right upper limb, including shoulder: Secondary | ICD-10-CM | POA: Diagnosis not present

## 2020-05-15 ENCOUNTER — Other Ambulatory Visit: Payer: Self-pay | Admitting: Internal Medicine

## 2020-05-15 DIAGNOSIS — I69393 Ataxia following cerebral infarction: Secondary | ICD-10-CM

## 2020-05-15 DIAGNOSIS — I6350 Cerebral infarction due to unspecified occlusion or stenosis of unspecified cerebral artery: Secondary | ICD-10-CM

## 2020-05-25 DIAGNOSIS — R2689 Other abnormalities of gait and mobility: Secondary | ICD-10-CM | POA: Diagnosis not present

## 2020-05-25 DIAGNOSIS — E519 Thiamine deficiency, unspecified: Secondary | ICD-10-CM | POA: Diagnosis not present

## 2020-05-25 DIAGNOSIS — F3341 Major depressive disorder, recurrent, in partial remission: Secondary | ICD-10-CM | POA: Diagnosis not present

## 2020-05-25 DIAGNOSIS — G3184 Mild cognitive impairment, so stated: Secondary | ICD-10-CM | POA: Diagnosis not present

## 2020-05-30 ENCOUNTER — Ambulatory Visit: Payer: Medicare HMO

## 2020-06-01 ENCOUNTER — Other Ambulatory Visit: Payer: Self-pay

## 2020-06-01 ENCOUNTER — Ambulatory Visit
Admission: RE | Admit: 2020-06-01 | Discharge: 2020-06-01 | Disposition: A | Discharge status: Home / Self Care | Payer: Medicare HMO | Source: Ambulatory Visit | Attending: Internal Medicine | Admitting: Internal Medicine

## 2020-06-01 DIAGNOSIS — I6350 Cerebral infarction due to unspecified occlusion or stenosis of unspecified cerebral artery: Secondary | ICD-10-CM | POA: Diagnosis not present

## 2020-06-01 DIAGNOSIS — Z8673 Personal history of transient ischemic attack (TIA), and cerebral infarction without residual deficits: Secondary | ICD-10-CM | POA: Diagnosis not present

## 2020-06-01 DIAGNOSIS — R270 Ataxia, unspecified: Secondary | ICD-10-CM | POA: Diagnosis not present

## 2020-06-01 DIAGNOSIS — I69393 Ataxia following cerebral infarction: Secondary | ICD-10-CM | POA: Insufficient documentation

## 2020-06-01 MED ORDER — GADOBUTROL 1 MMOL/ML IV SOLN
7.5000 mL | Freq: Once | INTRAVENOUS | Status: AC | PRN
  Administered 2020-06-01: 7.5 mL via INTRAVENOUS

## 2020-06-09 DIAGNOSIS — G3184 Mild cognitive impairment, so stated: Secondary | ICD-10-CM | POA: Diagnosis not present

## 2020-06-09 DIAGNOSIS — Z79899 Other long term (current) drug therapy: Secondary | ICD-10-CM | POA: Diagnosis not present

## 2020-06-09 DIAGNOSIS — E519 Thiamine deficiency, unspecified: Secondary | ICD-10-CM | POA: Diagnosis not present

## 2020-06-09 DIAGNOSIS — M8949 Other hypertrophic osteoarthropathy, multiple sites: Secondary | ICD-10-CM | POA: Diagnosis not present

## 2020-06-17 DIAGNOSIS — D0439 Carcinoma in situ of skin of other parts of face: Secondary | ICD-10-CM | POA: Diagnosis not present

## 2020-06-17 DIAGNOSIS — D2339 Other benign neoplasm of skin of other parts of face: Secondary | ICD-10-CM | POA: Diagnosis not present

## 2020-06-23 DIAGNOSIS — L4050 Arthropathic psoriasis, unspecified: Secondary | ICD-10-CM | POA: Diagnosis not present

## 2020-06-23 DIAGNOSIS — M8949 Other hypertrophic osteoarthropathy, multiple sites: Secondary | ICD-10-CM | POA: Diagnosis not present

## 2020-06-23 DIAGNOSIS — Z79899 Other long term (current) drug therapy: Secondary | ICD-10-CM | POA: Diagnosis not present

## 2020-06-29 ENCOUNTER — Ambulatory Visit: Payer: Medicare HMO | Attending: Neurology

## 2020-06-29 ENCOUNTER — Other Ambulatory Visit: Payer: Self-pay

## 2020-06-29 DIAGNOSIS — R262 Difficulty in walking, not elsewhere classified: Secondary | ICD-10-CM | POA: Diagnosis not present

## 2020-06-29 DIAGNOSIS — R2689 Other abnormalities of gait and mobility: Secondary | ICD-10-CM | POA: Insufficient documentation

## 2020-06-29 DIAGNOSIS — R296 Repeated falls: Secondary | ICD-10-CM | POA: Insufficient documentation

## 2020-06-29 DIAGNOSIS — R2681 Unsteadiness on feet: Secondary | ICD-10-CM | POA: Diagnosis not present

## 2020-06-29 DIAGNOSIS — M6281 Muscle weakness (generalized): Secondary | ICD-10-CM | POA: Diagnosis not present

## 2020-06-29 NOTE — Therapy (Signed)
Shane Ford [address] [place], [place], [address] Phone: [phone]   Fax:  [phone]  Physical Therapy Evaluation  Patient Details  Name: Shane Ford MRN: [id number] Date of Birth: 08-24-1945 Referring Provider (PT): Shane Books, MD   Encounter Date: 06/29/2020   PT End of Session - 06/29/20 0945    Visit Number 1    Number of Visits 13    Date for PT Re-Evaluation 08/28/20   POC for 6 weeks, Cert for 60 days   Authorization Type [employer]    Progress Note Due on Visit 10    PT Start Time 0910    PT Stop Time 0950    PT Time Calculation (min) 40 min    Equipment Utilized During Treatment Gait belt    Activity Tolerance Patient tolerated treatment well    Behavior During Therapy [facility] for tasks assessed/performed           History reviewed. No pertinent past medical history.  History reviewed. No pertinent surgical history.  There were no vitals filed for this visit.    Subjective Assessment - 06/29/20 0914    Subjective Patient reports that over hte last year he has started to have falls. Last fall occured in the summer. Reports that he had last big fall during the summer. Reports he has become more cautious. Seems to happen on unlevel surfaces per patient reports. Patient reports that he uses trekking pole when walking outdoors for long distance. Patient reports continues to exercise with elliptical or walking outdoors. Reports noticed cognitive changes along with the balance challenges.    Pertinent History Anxiety, Hyperlipidemia, Depression, DM Type 2, GERD, HTN, Cataracts, OA, Psoriatic Arhtropathy, Mild Cognitive Impairment    Limitations Walking    Patient Stated Goals Walk a straight line; feel more comfortable with the balance.    Currently in Pain? No/denies              [facility] PT Assessment - 06/29/20 0001      Assessment   Medical Diagnosis Gait Abnormality/Imbalance     Referring Provider (PT) Shane Books, MD    Onset Date/Surgical Date 06/11/20    Hand Dominance Right      Precautions   Precautions Fall    Precaution Comments Heart Stent      Restrictions   Weight Bearing Restrictions No      Balance Screen   Has the patient fallen in the past 6 months Yes    How many times? 1    Has the patient had a decrease in activity level because of a fear of falling?  No    Is the patient reluctant to leave their home because of a fear of falling?  No      Home Environment   Living Environment Private residence    Living Arrangements Spouse/significant other    Available Help at Discharge Family    Type of [place] to enter    Entrance Stairs-Number of Steps 1    [place] One level    Additional Comments reports miss steps freq on the step outdoors      Prior Function   Level of Independence Independent    Vocation Retired    Leisure exercise (elliptical), spend time outdoors in outdoors workshop, work on Radio producer; watch basketball      Cognition   Overall Cognitive Status History of cognitive impairments -  at baseline      Sensation   Light Touch Appears Intact    Additional Comments For BLE's. repots some intermittent numbness on bottom of feet.      ROM / Strength   AROM / PROM / Strength Strength;AROM      AROM   Overall AROM  Within functional limits for tasks performed    Overall AROM Comments for BLE's      Strength   Overall Strength Deficits    Strength Assessment Site Hip;Knee;Ankle    Right/Left Hip Right;Left    Right Hip Flexion 4/5    Right Hip ABduction 4-/5    Right Hip ADduction 4-/5    Left Hip Flexion 4/5    Left Hip ABduction 4-/5    Left Hip ADduction 4-/5    Right/Left Knee Right;Left    Right Knee Flexion 4+/5    Right Knee Extension 4+/5    Left Knee Flexion 4+/5    Left Knee Extension 4+/5    Right/Left Ankle Right;Left    Right Ankle Dorsiflexion  3+/5    Left Ankle Dorsiflexion 3+/5      Transfers   Transfers Sit to Stand;Stand to Sit    Sit to Stand 6: Modified independent (Device/Increase time)    Stand to Sit 6: Modified independent (Device/Increase time)      Ambulation/Gait   Ambulation/Gait Yes    Ambulation/Gait Assistance 5: Supervision    Ambulation/Gait Assistance Details supervision for safety, mild sway noted with ambulation. intemrittent decreased toe clearance or scuffing of shoe noted.    Ambulation Distance (Feet) 100 Feet    Assistive device None    Gait Pattern Step-through pattern;Poor foot clearance - left;Poor foot clearance - right    Ambulation Surface Level;Indoor    Gait velocity 10.79 secs = 3.03 secs    Stairs Yes    Stairs Assistance 5: Supervision    Stairs Assistance Details (indicate cue type and reason) compelted ascend/descend with alternating pattern and B rails.    Stair Management Technique Two rails;Alternating pattern;Forwards    Number of Stairs 8    Height of Stairs 6      High Level Balance   High Level Balance Comments Completed M-CTSIB: able to complete situation 1 + 2 for full 30 seconds, situation 3: 25 seconds, situation 4: < 5 seconds.      Functional Gait  Assessment   Gait assessed  Yes    Gait Level Surface Walks 20 ft in less than 7 sec but greater than 5.5 sec, uses assistive device, slower speed, mild gait deviations, or deviates 6-10 in outside of the 12 in walkway width.    Change in Gait Speed Able to change speed, demonstrates mild gait deviations, deviates 6-10 in outside of the 12 in walkway width, or no gait deviations, unable to achieve a major change in velocity, or uses a change in velocity, or uses an assistive device.    Gait with Horizontal Head Turns Performs head turns smoothly with slight change in gait velocity (eg, minor disruption to smooth gait path), deviates 6-10 in outside 12 in walkway width, or uses an assistive device.    Gait with Vertical Head  Turns Performs task with slight change in gait velocity (eg, minor disruption to smooth gait path), deviates 6 - 10 in outside 12 in walkway width or uses assistive device    Gait and Pivot Turn Pivot turns safely within 3 sec and stops quickly with no loss of balance.  Step Over Obstacle Is able to step over one shoe box (4.5 in total height) without changing gait speed. No evidence of imbalance.    Gait with Narrow Base of Support Ambulates less than 4 steps heel to toe or cannot perform without assistance.    Gait with Eyes Closed Walks 20 ft, slow speed, abnormal gait pattern, evidence for imbalance, deviates 10-15 in outside 12 in walkway width. Requires more than 9 sec to ambulate 20 ft.    Ambulating Backwards Walks 20 ft, uses assistive device, slower speed, mild gait deviations, deviates 6-10 in outside 12 in walkway width.    Steps Alternating feet, must use rail.    Total Score 18    FGA comment: 18/30 = High Fall risk                      Objective measurements completed on examination: See above findings.               PT Education - 06/29/20 0950    Education Details Educated on [employer]) Educated Patient            PT Short Term Goals - 06/29/20 1001      PT SHORT TERM GOAL #1   Title Patient will be independent with initial balance HEP (All STGS Due: 07/20/20)    Baseline no HEP established    Time 3    Period Weeks    Status New    Target Date 07/20/20      PT SHORT TERM GOAL #2   Title Patient will demonstrate ability to hold situation 3 of M-CTSIB for full 30 seconds to demo improved balance.    Baseline 25 secs    Time 3    Period Weeks    Status New      PT SHORT TERM GOAL #3   Title Patient will improve FGA to >/= 20 to demonstrate improved balance and reduced fall risk    Baseline 18/30    Time 3    Period Weeks    Status New             PT Long Term Goals - 06/29/20 1003      PT LONG TERM  GOAL #1   Title Patient wil be independent with final balance HEP (all LTGS due: 08/10/20)    Baseline no HEP established    Time 6    Period Weeks    Status New    Target Date 08/10/20      PT LONG TERM GOAL #2   Title Patient will be able to hold situation 4 of M-CTSIB for >/= 20 seconds to demonstrate improved balance    Baseline < 5 seconds    Time 6    Period Weeks    Status New      PT LONG TERM GOAL #3   Title Patient will improve FGA to >/= 23/30 to demonstrate improved balance and reduced fall risk    Baseline 18/30    Time 6    Period Weeks    Status New      PT LONG TERM GOAL #4   Title Patient will demo ability to ambulate > 500 ft on unlevel outdoors surfaces Mod I with LRAD vs. no AD to demonstrate improved community mobility    Baseline TBA    Time 6    Period Weeks    Status New  PT LONG TERM GOAL #5   Title Patient will be educated on fall prevention strategies within the home to promote improved safety and reduced fall risk.    Baseline dependent    Time 6    Period Weeks    Status New                  Plan - 06/29/20 0953    Clinical Impression Statement Patient is a 75 y.o. male that was referred to Neuro OPPT services for imbalance and gait abnormalitity. Patient's PMH is significant for the following: Anxiety, Hyperlipidemia, Depression, DM Type 2, GERD, HTN, Cataracts, OA, Psoriatic Arhtropathy, Mild Cognitive Impairment. Upon evaluation patient presents with the following impairments: impaired balance, decreased strength, abnormal gait, and increased risk for falls. With completion of FGA, patient scored 18/30 demosntrating increased risk for falls. Patient demosntrating decreased balance with M-CTSIB, specifically situation 4 iwth unable to hold 5 seconds showing decreased balance. patient will benefit from skilled PT services to maximize functional mobility, address impairments listed above, and reduce fall risk.    Personal Factors and  Comorbidities Age;Comorbidity 3+    Comorbidities Anxiety, Hyperlipidemia, Depression, DM Type 2, GERD, HTN, Cataracts, OA, Psoriatic Arhtropathy, Mild Cognitive Impairment    Examination-Activity Limitations Stairs;Locomotion Level;Transfers    Examination-Participation Restrictions Yard Work;Community Activity;Driving    Stability/Clinical Decision Making Evolving/Moderate complexity    Clinical Decision Making Moderate    Rehab Potential Good    PT Frequency 2x / week    PT Duration 6 weeks    PT Treatment/Interventions ADLs/Self Care Home Management;Electrical Stimulation;Cryotherapy;Moist Heat;DME Instruction;Stair training;Functional mobility training;Therapeutic exercise;Gait training;Balance training;Neuromuscular re-education;Therapeutic activities;Patient/family education;Orthotic Fit/Training;Vestibular;Passive range of motion;Manual techniques    PT Next Visit Plan Initiate Balance HEP    Consulted and Agree with Plan of Care Patient           Patient will benefit from skilled therapeutic intervention in order to improve the following deficits and impairments:  Decreased balance,Difficulty walking,Abnormal gait,Impaired sensation,Decreased strength,Decreased activity tolerance,Dizziness  Visit Diagnosis: Other abnormalities of gait and mobility  Unsteadiness on feet  Repeated falls  Muscle weakness (generalized)     Problem List There are no problems to display for this patient.   Tempie Donning, PT, DPT 06/29/2020, 10:15 AM  [facility] [address] [place], [place], [address] Phone: [phone]   Fax:  [phone]  Name: Shane Ford MRN: [id number] Date of Birth: 1945/07/06

## 2020-07-02 ENCOUNTER — Ambulatory Visit: Payer: Medicare HMO

## 2020-07-02 ENCOUNTER — Other Ambulatory Visit: Payer: Self-pay

## 2020-07-02 DIAGNOSIS — R262 Difficulty in walking, not elsewhere classified: Secondary | ICD-10-CM | POA: Diagnosis not present

## 2020-07-02 DIAGNOSIS — M6281 Muscle weakness (generalized): Secondary | ICD-10-CM | POA: Diagnosis not present

## 2020-07-02 DIAGNOSIS — R296 Repeated falls: Secondary | ICD-10-CM

## 2020-07-02 DIAGNOSIS — R2689 Other abnormalities of gait and mobility: Secondary | ICD-10-CM

## 2020-07-02 DIAGNOSIS — R2681 Unsteadiness on feet: Secondary | ICD-10-CM | POA: Diagnosis not present

## 2020-07-02 NOTE — Therapy (Signed)
[place] [address] [place], [place], [address] Phone: [phone]   Fax:  [phone]  Physical Therapy Treatment  Patient Details  Name: Shane Ford MRN: [id number] Date of Birth: Feb 18, 1946 Referring Provider (PT): Jennings Books, MD   Encounter Date: 07/02/2020   PT End of Session - 07/02/20 0932    Visit Number 2    Number of Visits 13    Date for PT Re-Evaluation 08/28/20   POC for 6 weeks, Cert for 60 days   Authorization Type [employer]    Progress Note Due on Visit 10    PT Start Time 0932    PT Stop Time 1014    PT Time Calculation (min) 42 min    Equipment Utilized During Treatment Gait belt    Activity Tolerance Patient tolerated treatment well    Behavior During Therapy [facility] for tasks assessed/performed           History reviewed. No pertinent past medical history.  History reviewed. No pertinent surgical history.  There were no vitals filed for this visit.   Subjective Assessment - 07/02/20 0934    Subjective No changes since initial visit. No falls or pain to report.    Pertinent History Anxiety, Hyperlipidemia, Depression, DM Type 2, GERD, HTN, Cataracts, OA, Psoriatic Arhtropathy, Mild Cognitive Impairment    Limitations Walking    Patient Stated Goals Walk a straight line; feel more comfortable with the balance.    Currently in Pain? No/denies              [facility] Adult PT Treatment/Exercise - 07/02/20 0001      Ambulation/Gait   Ambulation/Gait Yes    Ambulation/Gait Assistance 5: Supervision    Ambulation/Gait Assistance Details throughout therapy gym with activities    Assistive device None    Gait Pattern Step-through pattern;Poor foot clearance - left;Poor foot clearance - right    Ambulation Surface Level;Indoor               Balance Exercises - 07/02/20 0001      Balance Exercises: Standing   Standing Eyes Opened Narrow base of support (BOS);Head  turns;Foam/compliant surface;Limitations    Standing Eyes Opened Limitations horizontal/vertical x 10 reps with narrow BOS    Standing Eyes Closed Wide (BOA);Foam/compliant surface;3 reps;30 secs    Tandem Stance Eyes open;Intermittent upper extremity support;2 reps;30 secs;Limitations    Tandem Stance Time alternating foot position, 3/4th tandem    SLS Eyes open;Solid surface;Upper extremity support 1;3 reps;15 secs   10-15 secs   Rockerboard Anterior/posterior;Lateral;Head turns;EO;Limitations    Rockerboard Limitations standing on rockerboard A/P completed vertical head turns x 10 reps and anterior /posteriro weight shift x 10 reps. With board positioned laterally completed horizontal head turns x 10 reps, and then lateral weight shift to R/L x 10 reps. Increased balance challenge once with positioned horiozntla requiring Min A from PT to maintain balance.    Tandem Gait Forward;Intermittent upper extremity support;3 reps;Limitations    Tandem Gait Limitations completed x 3 laps down and back at countertop with light UE support          Established initial balance HEP in session, completing the following exercises. Educated to use countertop and complete balance exercises in corner as well for improved safety.   Access Code: [email or web address] URL: [email or web address] Date: 07/02/2020 Prepared by: Baldomero Lamy  Exercises Standing Balance with Eyes Closed on Foam - 1 x daily - 5 x weekly - 1 sets -  3 reps - 30 hold Romberg Stance on Foam Pad with Head Rotation - 1 x daily - 5 x weekly - 3 sets - 10 reps Romberg Stance with Head Nods on Foam Pad - 1 x daily - 5 x weekly - 2 sets - 10 reps Standing Romberg to 3/4 Tandem Stance - 1 x daily - 5 x weekly - 1 sets - 3 reps - 30 hold Standing Single Leg Stance with Counter Support - 1 x daily - 7 x weekly - 3 sets - 10 reps Tandem Walking with Counter Support - 1 x daily - 5 x weekly - 1 sets - 5 reps    PT Education - 07/02/20 1002     Education Details educated on initial HEP    Person(s) Educated Patient    Methods Explanation;Demonstration;Handout    Comprehension Verbalized understanding;Returned demonstration            PT Short Term Goals - 06/29/20 1001      PT SHORT TERM GOAL #1   Title Patient will be independent with initial balance HEP (All STGS Due: 07/20/20)    Baseline no HEP established    Time 3    Period Weeks    Status New    Target Date 07/20/20      PT SHORT TERM GOAL #2   Title Patient will demonstrate ability to hold situation 3 of M-CTSIB for full 30 seconds to demo improved balance.    Baseline 25 secs    Time 3    Period Weeks    Status New      PT SHORT TERM GOAL #3   Title Patient will improve FGA to >/= 20 to demonstrate improved balance and reduced fall risk    Baseline 18/30    Time 3    Period Weeks    Status New             PT Long Term Goals - 06/29/20 1003      PT LONG TERM GOAL #1   Title Patient wil be independent with final balance HEP (all LTGS due: 08/10/20)    Baseline no HEP established    Time 6    Period Weeks    Status New    Target Date 08/10/20      PT LONG TERM GOAL #2   Title Patient will be able to hold situation 4 of M-CTSIB for >/= 20 seconds to demonstrate improved balance    Baseline < 5 seconds    Time 6    Period Weeks    Status New      PT LONG TERM GOAL #3   Title Patient will improve FGA to >/= 23/30 to demonstrate improved balance and reduced fall risk    Baseline 18/30    Time 6    Period Weeks    Status New      PT LONG TERM GOAL #4   Title Patient will demo ability to ambulate > 500 ft on unlevel outdoors surfaces Mod I with LRAD vs. no AD to demonstrate improved community mobility    Baseline TBA    Time 6    Period Weeks    Status New      PT LONG TERM GOAL #5   Title Patient will be educated on fall prevention strategies within the home to promote improved safety and reduced fall risk.    Baseline dependent     Time 6    Period Weeks  Status New                 Plan - 07/02/20 1016    Clinical Impression Statement Today's skilled PT session focused on establishing initial HEP focused on balance exercises. Increased challenge with vision removed, horizontal head turns, and on complaint surface. Patient tolerating all exercises well. Will continue to progress toward all LTGs.    Personal Factors and Comorbidities Age;Comorbidity 3+    Comorbidities Anxiety, Hyperlipidemia, Depression, DM Type 2, GERD, HTN, Cataracts, OA, Psoriatic Arhtropathy, Mild Cognitive Impairment    Examination-Activity Limitations Stairs;Locomotion Level;Transfers    Examination-Participation Restrictions Yard Work;Community Activity;Driving    Stability/Clinical Decision Making Evolving/Moderate complexity    Rehab Potential Good    PT Frequency 2x / week    PT Duration 6 weeks    PT Treatment/Interventions ADLs/Self Care Home Management;Electrical Stimulation;Cryotherapy;Moist Heat;DME Instruction;Stair training;Functional mobility training;Therapeutic exercise;Gait training;Balance training;Neuromuscular re-education;Therapeutic activities;Patient/family education;Orthotic Fit/Training;Vestibular;Passive range of motion;Manual techniques    PT Next Visit Plan how are exercises going? continue to progress balance HEP. continue high level balance. working on improved negotiation over obstacles.    Consulted and Agree with Plan of Care Patient           Patient will benefit from skilled therapeutic intervention in order to improve the following deficits and impairments:  Decreased balance,Difficulty walking,Abnormal gait,Impaired sensation,Decreased strength,Decreased activity tolerance,Dizziness  Visit Diagnosis: Other abnormalities of gait and mobility  Unsteadiness on feet  Repeated falls  Muscle weakness (generalized)     Problem List There are no problems to display for this patient.   Tempie Donning, PT, DPT 07/02/2020, 10:18 AM  [facility] [address] [place], [place], [address] Phone: [phone]   Fax:  [phone]  Name: Logon Uttech MRN: [id number] Date of Birth: [birth year]

## 2020-07-02 NOTE — Patient Instructions (Signed)
Access Code: [email or web address] URL: [email or web address] Date: 07/02/2020 Prepared by: Jethro Bastos  Exercises Standing Balance with Eyes Closed on Foam - 1 x daily - 5 x weekly - 1 sets - 3 reps - 30 hold Romberg Stance on Foam Pad with Head Rotation - 1 x daily - 5 x weekly - 3 sets - 10 reps Romberg Stance with Head Nods on Foam Pad - 1 x daily - 5 x weekly - 2 sets - 10 reps Standing Romberg to 3/4 Tandem Stance - 1 x daily - 5 x weekly - 1 sets - 3 reps - 30 hold Standing Single Leg Stance with Counter Support - 1 x daily - 7 x weekly - 3 sets - 10 reps Tandem Walking with Counter Support - 1 x daily - 5 x weekly - 1 sets - 5 reps

## 2020-07-06 ENCOUNTER — Encounter: Payer: Self-pay | Admitting: Physical Therapy

## 2020-07-06 ENCOUNTER — Ambulatory Visit: Payer: Medicare HMO | Admitting: Physical Therapy

## 2020-07-06 ENCOUNTER — Other Ambulatory Visit: Payer: Self-pay

## 2020-07-06 DIAGNOSIS — R296 Repeated falls: Secondary | ICD-10-CM

## 2020-07-06 DIAGNOSIS — R2689 Other abnormalities of gait and mobility: Secondary | ICD-10-CM | POA: Diagnosis not present

## 2020-07-06 DIAGNOSIS — M6281 Muscle weakness (generalized): Secondary | ICD-10-CM

## 2020-07-06 DIAGNOSIS — R2681 Unsteadiness on feet: Secondary | ICD-10-CM | POA: Diagnosis not present

## 2020-07-06 DIAGNOSIS — R262 Difficulty in walking, not elsewhere classified: Secondary | ICD-10-CM | POA: Diagnosis not present

## 2020-07-06 NOTE — Therapy (Signed)
[place] [address] [place], [place], [address] Phone: [phone]   Fax:  [phone]  Physical Therapy Treatment  Patient Details  Name: Shane Ford MRN: [id number] Date of Birth: 11/02/1945 Referring Provider (PT): Jennings Books, MD   Encounter Date: 07/06/2020   PT End of Session - 07/06/20 1151    Visit Number 3    Number of Visits 13    Date for PT Re-Evaluation 08/28/20   POC for 6 weeks, Cert for 60 days   Authorization Type [employer]    Progress Note Due on Visit 10    PT Start Time 1018    PT Stop Time 1058    PT Time Calculation (min) 40 min    Equipment Utilized During Treatment Gait belt    Activity Tolerance Patient tolerated treatment well    Behavior During Therapy [facility] for tasks assessed/performed           History reviewed. No pertinent past medical history.  History reviewed. No pertinent surgical history.  There were no vitals filed for this visit.   Subjective Assessment - 07/06/20 1021    Subjective Has tried the exercises at home, his foam pad didn't work like he was expecting it to. No falls.    Pertinent History Anxiety, Hyperlipidemia, Depression, DM Type 2, GERD, HTN, Cataracts, OA, Psoriatic Arhtropathy, Mild Cognitive Impairment    Limitations Walking    Patient Stated Goals Walk a straight line; feel more comfortable with the balance.    Currently in Pain? No/denies                             [facility] Adult PT Treatment/Exercise - 07/06/20 0001      High Level Balance   High Level Balance Comments push and release test: anterior 1 solid step, posterior 2 steps to regain balance               Balance Exercises - 07/06/20 0001      Balance Exercises: Standing   Standing Eyes Opened Narrow base of support (BOS);Head turns;Foam/compliant surface;Limitations    Standing Eyes Opened Limitations on air ex static 3 x 30 seconds, with feet hip  width distance 2 x 10 reps head turns, 2 x  10 reps head nods    Stepping Strategy Anterior;Posterior;Foam/compliant surface;Limitations    Stepping Strategy Limitations on blue foam beam, intermittent UE support for balance x10 reps each direction, pt at times with difficulty with foot clearance   Tandem Gait Forward;4 reps    Tandem Gait Limitations on blue mat, intermittent UE support    Partial Tandem Stance Eyes open;2 reps;Limitations    Partial Tandem Stance Limitations 2 x 5 reps head turns on blue air ex, incr difficulty with LLE posteriorly    Step Over Hurdles / Cones on blue mat: stepping over 2 smaller orange hurdles and 1 larger hurdle down and back x3 reps, intermittent UE support, cues for incr hip/knee flexion to clear    Other Standing Exercises heel toe raises on air ex x10 reps intermittent UE support   Other Standing Exercises Comments on blue mat: marching forwards x4 reps and retro gait x4 reps (intermittent cues for incr step length) on blue mat               PT Short Term Goals - 06/29/20 1001      PT SHORT TERM GOAL #1   Title  Patient will be independent with initial balance HEP (All STGS Due: 07/20/20)    Baseline no HEP established    Time 3    Period Weeks    Status New    Target Date 07/20/20      PT SHORT TERM GOAL #2   Title Patient will demonstrate ability to hold situation 3 of M-CTSIB for full 30 seconds to demo improved balance.    Baseline 25 secs    Time 3    Period Weeks    Status New      PT SHORT TERM GOAL #3   Title Patient will improve FGA to >/= 20 to demonstrate improved balance and reduced fall risk    Baseline 18/30    Time 3    Period Weeks    Status New             PT Long Term Goals - 06/29/20 1003      PT LONG TERM GOAL #1   Title Patient wil be independent with final balance HEP (all LTGS due: 08/10/20)    Baseline no HEP established    Time 6    Period Weeks    Status New    Target Date 08/10/20      PT LONG  TERM GOAL #2   Title Patient will be able to hold situation 4 of M-CTSIB for >/= 20 seconds to demonstrate improved balance    Baseline < 5 seconds    Time 6    Period Weeks    Status New      PT LONG TERM GOAL #3   Title Patient will improve FGA to >/= 23/30 to demonstrate improved balance and reduced fall risk    Baseline 18/30    Time 6    Period Weeks    Status New      PT LONG TERM GOAL #4   Title Patient will demo ability to ambulate > 500 ft on unlevel outdoors surfaces Mod I with LRAD vs. no AD to demonstrate improved community mobility    Baseline TBA    Time 6    Period Weeks    Status New      PT LONG TERM GOAL #5   Title Patient will be educated on fall prevention strategies within the home to promote improved safety and reduced fall risk.    Baseline dependent    Time 6    Period Weeks    Status New                 Plan - 07/06/20 1153    Clinical Impression Statement Today's skilled session continued to focus on balance strategies on compliant surfaces with vision removed, head motions, narrow BOS, and SLS. Pt tolerated session well. Pt with incr difficulty at times with foot clearance while performing stepping strategy on and off balance beam. Will continue to progress towards LTGs.    Personal Factors and Comorbidities Age;Comorbidity 3+    Comorbidities Anxiety, Hyperlipidemia, Depression, DM Type 2, GERD, HTN, Cataracts, OA, Psoriatic Arhtropathy, Mild Cognitive Impairment    Examination-Activity Limitations Stairs;Locomotion Level;Transfers    Examination-Participation Restrictions Yard Work;Community Activity;Driving    Stability/Clinical Decision Making Evolving/Moderate complexity    Rehab Potential Good    PT Frequency 2x / week    PT Duration 6 weeks    PT Treatment/Interventions ADLs/Self Care Home Management;Electrical Stimulation;Cryotherapy;Moist Heat;DME Instruction;Stair training;Functional mobility training;Therapeutic exercise;Gait  training;Balance training;Neuromuscular re-education;Therapeutic activities;Patient/family education;Orthotic Fit/Training;Vestibular;Passive range of motion;Manual techniques  PT Next Visit Plan how are exercises going? continue to progress balance HEP. continue high level balance. working on improved negotiation over obstacles.    Consulted and Agree with Plan of Care Patient           Patient will benefit from skilled therapeutic intervention in order to improve the following deficits and impairments:  Decreased balance,Difficulty walking,Abnormal gait,Impaired sensation,Decreased strength,Decreased activity tolerance,Dizziness  Visit Diagnosis: Unsteadiness on feet  Other abnormalities of gait and mobility  Repeated falls  Muscle weakness (generalized)     Problem List There are no problems to display for this patient.   Lillia Pauls, DPT  07/06/2020, 11:54 AM  [place] [address] [place], [place], [address] Phone: [phone]   Fax:  [phone]  Name: Moses Odoherty MRN: [id number] Date of Birth: Aug 15, 1945

## 2020-07-08 ENCOUNTER — Other Ambulatory Visit: Payer: Self-pay

## 2020-07-08 ENCOUNTER — Ambulatory Visit: Payer: Medicare HMO

## 2020-07-08 DIAGNOSIS — R262 Difficulty in walking, not elsewhere classified: Secondary | ICD-10-CM | POA: Diagnosis not present

## 2020-07-08 DIAGNOSIS — R2681 Unsteadiness on feet: Secondary | ICD-10-CM

## 2020-07-08 DIAGNOSIS — R296 Repeated falls: Secondary | ICD-10-CM | POA: Diagnosis not present

## 2020-07-08 DIAGNOSIS — R2689 Other abnormalities of gait and mobility: Secondary | ICD-10-CM | POA: Diagnosis not present

## 2020-07-08 DIAGNOSIS — M6281 Muscle weakness (generalized): Secondary | ICD-10-CM | POA: Diagnosis not present

## 2020-07-08 NOTE — Therapy (Signed)
[place] [address] [place], [place], [address] Phone: [phone]   Fax:  [phone]  Physical Therapy Treatment  Patient Details  Name: Shane Ford MRN: [id number] Date of Birth: March 16, 1946 Referring Provider (PT): Jennings Books, MD   Encounter Date: 07/08/2020   PT End of Session - 07/08/20 0959    Visit Number 4    Number of Visits 13    Date for PT Re-Evaluation 08/28/20    Authorization Type [employer]    Progress Note Due on Visit 10    PT Start Time 0845    PT Stop Time 0930    PT Time Calculation (min) 45 min    Equipment Utilized During Treatment Gait belt    Activity Tolerance Patient tolerated treatment well    Behavior During Therapy [facility] for tasks assessed/performed           History reviewed. No pertinent past medical history.  History reviewed. No pertinent surgical history.  There were no vitals filed for this visit.   Subjective Assessment - 07/08/20 1326    Subjective Patient reports no med changes or pain, has been compliant with HEP and feels PT iterventions are helpful in hm achieving his goal of walkig in a sraight line    Pertinent History Anxiety, Hyperlipidemia, Depression, DM Type 2, GERD, HTN, Cataracts, OA, Psoriatic Arhtropathy, Mild Cognitive Impairment    Limitations Walking    Patient Stated Goals Walk a straight line; feel more comfortable with the balance.    Currently in Pain? No/denies    Multiple Pain Sites No                             [facility] Adult PT Treatment/Exercise - 07/08/20 0001      Ambulation/Gait   Pre-Gait Activities 10 minutes on sci-fit. level 2-2.5   performed as warm up prior to activities              Balance Exercises - 07/08/20 0001      Balance Exercises: Standing   Standing Eyes Opened Narrow base of support (BOS);Foam/compliant surface;30 secs   30sx3 narrow BOS on airex   Standing Eyes Opened  Limitations CGA for safety    Standing Eyes Closed Narrow base of support (BOS);Head turns   10 reps of c-rot/sb/nodding 2   Stepping Strategy Anterior;Posterior;Foam/compliant surface;10 reps   6' step used for unilateral step on/off from foam to step and back usng UE uspport as needed   Stepping Strategy Limitations --    Rockerboard Lateral;EO;EC;10 reps    Rockerboard Limitations active weight shifting on rocker board, 10 reps ea.  EO/EC using UE support as needed    Tandem Gait Limitations tandem walking for 5 trips in bars UE support as needed encouraging patient to use minimal support of bars. utilized blue foam beam               PT Short Term Goals - 06/29/20 1001      PT SHORT TERM GOAL #1   Title Patient will be independent with initial balance HEP (All STGS Due: 07/20/20)    Baseline no HEP established    Time 3    Period Weeks    Status New    Target Date 07/20/20      PT SHORT TERM GOAL #2   Title Patient will demonstrate ability to hold situation 3 of M-CTSIB for full 30 seconds to  demo improved balance.    Baseline 25 secs    Time 3    Period Weeks    Status New      PT SHORT TERM GOAL #3   Title Patient will improve FGA to >/= 20 to demonstrate improved balance and reduced fall risk    Baseline 18/30    Time 3    Period Weeks    Status New             PT Long Term Goals - 06/29/20 1003      PT LONG TERM GOAL #1   Title Patient wil be independent with final balance HEP (all LTGS due: 08/10/20)    Baseline no HEP established    Time 6    Period Weeks    Status New    Target Date 08/10/20      PT LONG TERM GOAL #2   Title Patient will be able to hold situation 4 of M-CTSIB for >/= 20 seconds to demonstrate improved balance    Baseline < 5 seconds    Time 6    Period Weeks    Status New      PT LONG TERM GOAL #3   Title Patient will improve FGA to >/= 23/30 to demonstrate improved balance and reduced fall risk    Baseline 18/30    Time 6     Period Weeks    Status New      PT LONG TERM GOAL #4   Title Patient will demo ability to ambulate > 500 ft on unlevel outdoors surfaces Mod I with LRAD vs. no AD to demonstrate improved community mobility    Baseline TBA    Time 6    Period Weeks    Status New      PT LONG TERM GOAL #5   Title Patient will be educated on fall prevention strategies within the home to promote improved safety and reduced fall risk.    Baseline dependent    Time 6    Period Weeks    Status New                 Plan - 07/08/20 1050    Clinical Impression Statement patien tperformed high levele balance tasks in order to assist him in achieving his goal of walking in a staight line.  He cotinues to require varied level of asssit form SBA to CGA, little to no cuing needed for proper form and safety awareness during activity, minor LOB noted during performance of activites due to level of complexity with patient able to recognize and correct appropriately.  Patien reports therapy is helping and is compliant with appointments    Personal Factors and Comorbidities Age;Comorbidity 1    Comorbidities Anxiety, Hyperlipidemia, Depression, DM Type 2, GERD, HTN, Cataracts, OA, Psoriatic Arhtropathy, Mild Cognitive Impairment    Examination-Activity Limitations Stairs;Locomotion Level;Transfers    Examination-Participation Restrictions Yard Work;Community Activity;Driving    Stability/Clinical Decision Making Evolving/Moderate complexity    Clinical Decision Making Moderate    Rehab Potential Good    PT Frequency 2x / week    PT Duration 6 weeks    PT Treatment/Interventions ADLs/Self Care Home Management;Electrical Stimulation;Cryotherapy;Moist Heat;DME Instruction;Stair training;Functional mobility training;Therapeutic exercise;Gait training;Balance training;Neuromuscular re-education;Therapeutic activities;Patient/family education;Orthotic Fit/Training;Vestibular;Passive range of motion;Manual techniques    PT  Next Visit Plan Continue to self assess balance and progress towards personal goals and relate questions and concerns to therapist    Consulted and Agree with Plan of Care  Patient           Patient will benefit from skilled therapeutic intervention in order to improve the following deficits and impairments:  Decreased balance,Difficulty walking,Abnormal gait,Impaired sensation,Decreased strength,Decreased activity tolerance,Dizziness  Visit Diagnosis: Unsteadiness on feet  Other abnormalities of gait and mobility  Repeated falls  Muscle weakness (generalized)     Problem List There are no problems to display for this patient.   Lanice Shirts PT 07/08/2020, 1:31 PM  [facility] [address] [place], [place], [address] Phone: [phone]   Fax:  [phone]  Name: Joven Smeby MRN: [id number] Date of Birth: 05-24-1946

## 2020-07-13 ENCOUNTER — Ambulatory Visit: Payer: Medicare HMO | Admitting: Physical Therapy

## 2020-07-15 ENCOUNTER — Ambulatory Visit: Payer: Medicare HMO

## 2020-07-15 ENCOUNTER — Other Ambulatory Visit: Payer: Self-pay

## 2020-07-15 DIAGNOSIS — M6281 Muscle weakness (generalized): Secondary | ICD-10-CM | POA: Diagnosis not present

## 2020-07-15 DIAGNOSIS — R296 Repeated falls: Secondary | ICD-10-CM | POA: Diagnosis not present

## 2020-07-15 DIAGNOSIS — R262 Difficulty in walking, not elsewhere classified: Secondary | ICD-10-CM | POA: Diagnosis not present

## 2020-07-15 DIAGNOSIS — R2681 Unsteadiness on feet: Secondary | ICD-10-CM

## 2020-07-15 DIAGNOSIS — R2689 Other abnormalities of gait and mobility: Secondary | ICD-10-CM

## 2020-07-15 NOTE — Therapy (Signed)
[facility] [address] [place], [place], [address] Phone: [phone]   Fax:  [phone]  Physical Therapy Treatment  Patient Details  Name: Shane Ford MRN: [id number] Date of Birth: Nov 23, 1945 Referring Provider (PT): Jennings Books, MD   Encounter Date: 07/15/2020   PT End of Session - 07/15/20 0928    Visit Number 5    Number of Visits 13    Date for PT Re-Evaluation 08/28/20    Authorization Type [employer]    Progress Note Due on Visit 10    PT Start Time 0930    PT Stop Time 1015    PT Time Calculation (min) 45 min    Equipment Utilized During Treatment Gait belt    Activity Tolerance Patient tolerated treatment well    Behavior During Therapy [facility] for tasks assessed/performed           History reviewed. No pertinent past medical history.  History reviewed. No pertinent surgical history.  There were no vitals filed for this visit.   Subjective Assessment - 07/15/20 0928    Subjective Patient reports no med changes or pain.  Reports minor fall on ice and L wrist is sore but not limiting function    Pertinent History Anxiety, Hyperlipidemia, Depression, DM Type 2, GERD, HTN, Cataracts, OA, Psoriatic Arhtropathy, Mild Cognitive Impairment    Limitations Walking    Patient Stated Goals Walk a straight line; feel more comfortable with the balance.    Currently in Pain? Yes    Pain Score 2     Pain Location Wrist    Pain Orientation Left    Pain Descriptors / Indicators Sore    Pain Type Acute pain    Pain Onset Yesterday    Multiple Pain Sites No                             [facility] Adult PT Treatment/Exercise - 07/15/20 0001      Ambulation/Gait   Pre-Gait Activities 10 minutes on sci-fit. level 2-2.5   warm up              Balance Exercises - 07/15/20 0001      Balance Exercises: Standing   Standing Eyes Opened Narrow base of support (BOS);Foam/compliant  surface;3 reps;30 secs    Standing Eyes Opened Limitations CGA for safety   cervical rotation, nods and lateral flexion x 10 reps,   Standing Eyes Closed Narrow base of support (BOS);Head turns    Tandem Stance Eyes closed;3 reps;30 secs    Stepping Strategy Anterior;Posterior;Foam/compliant surface;10 reps    Stepping Strategy Limitations on blue foam beam using 8" step    Rockerboard Anterior/posterior;Lateral;EO;UE support   30 reps each of A/P and lateral weight shift.   Tandem Gait Limitations tandem walking for 5 trips in bars UE support as needed encouraging patient to use minimal support of bars. utilized blue foam beam          transitioned to taller foam during session to challenge patient as he was becoming proficient with airex     PT Short Term Goals - 06/29/20 1001      PT SHORT TERM GOAL #1   Title Patient will be independent with initial balance HEP (All STGS Due: 07/20/20)    Baseline no HEP established    Time 3    Period Weeks    Status New    Target Date 07/20/20  PT SHORT TERM GOAL #2   Title Patient will demonstrate ability to hold situation 3 of M-CTSIB for full 30 seconds to demo improved balance.    Baseline 25 secs    Time 3    Period Weeks    Status New      PT SHORT TERM GOAL #3   Title Patient will improve FGA to >/= 20 to demonstrate improved balance and reduced fall risk    Baseline 18/30    Time 3    Period Weeks    Status New             PT Long Term Goals - 06/29/20 1003      PT LONG TERM GOAL #1   Title Patient wil be independent with final balance HEP (all LTGS due: 08/10/20)    Baseline no HEP established    Time 6    Period Weeks    Status New    Target Date 08/10/20      PT LONG TERM GOAL #2   Title Patient will be able to hold situation 4 of M-CTSIB for >/= 20 seconds to demonstrate improved balance    Baseline < 5 seconds    Time 6    Period Weeks    Status New      PT LONG TERM GOAL #3   Title Patient will  improve FGA to >/= 23/30 to demonstrate improved balance and reduced fall risk    Baseline 18/30    Time 6    Period Weeks    Status New      PT LONG TERM GOAL #4   Title Patient will demo ability to ambulate > 500 ft on unlevel outdoors surfaces Mod I with LRAD vs. no AD to demonstrate improved community mobility    Baseline TBA    Time 6    Period Weeks    Status New      PT LONG TERM GOAL #5   Title Patient will be educated on fall prevention strategies within the home to promote improved safety and reduced fall risk.    Baseline dependent    Time 6    Period Weeks    Status New                 Plan - 07/15/20 0929    Clinical Impression Statement patient reports he is 75% improved regarding ambulation and balance but loses balance when distarcted.  He sustained a fall yesterday with some mild L wrist soreness reported, not enough to limitfunction.  He was progressed to more difficult balance tasks as noted by increased reps and more compliant foam surfaces.  Several LOB episodes noted during session with fatigue also playing a factor in his overall peformance    Personal Factors and Comorbidities Age;Comorbidity 1    Comorbidities Anxiety, Hyperlipidemia, Depression, DM Type 2, GERD, HTN, Cataracts, OA, Psoriatic Arhtropathy, Mild Cognitive Impairment    Examination-Activity Limitations Stairs;Locomotion Level;Transfers    Examination-Participation Restrictions Yard Work;Community Activity;Driving    Stability/Clinical Decision Making Evolving/Moderate complexity    Rehab Potential Good    PT Frequency 2x / week    PT Duration 6 weeks    PT Treatment/Interventions ADLs/Self Care Home Management;Electrical Stimulation;Cryotherapy;Moist Heat;DME Instruction;Stair training;Functional mobility training;Therapeutic exercise;Gait training;Balance training;Neuromuscular re-education;Therapeutic activities;Patient/family education;Orthotic Fit/Training;Vestibular;Passive range of  motion;Manual techniques    PT Next Visit Plan Check STGs. Advance balance tasks and incorporate rest breaks to prevent fatigue, add cog challenges to distract patient so as to  assist him in regaining ability to function while disracted    Consulted and Agree with Plan of Care Patient           Patient will benefit from skilled therapeutic intervention in order to improve the following deficits and impairments:  Decreased balance,Difficulty walking,Abnormal gait,Impaired sensation,Decreased strength,Decreased activity tolerance,Dizziness  Visit Diagnosis: Unsteadiness on feet  Other abnormalities of gait and mobility  Repeated falls     Problem List There are no problems to display for this patient.   Lanice Shirts  PT 07/15/2020, 10:53 AM  [place] [address] [place], [place], [address] Phone: [phone]   Fax:  [phone]  Name: Shane Ford MRN: [id number] Date of Birth: 08/07/1945

## 2020-07-20 ENCOUNTER — Other Ambulatory Visit: Payer: Self-pay

## 2020-07-20 ENCOUNTER — Ambulatory Visit: Payer: Medicare HMO

## 2020-07-20 DIAGNOSIS — R2681 Unsteadiness on feet: Secondary | ICD-10-CM

## 2020-07-20 DIAGNOSIS — R296 Repeated falls: Secondary | ICD-10-CM | POA: Diagnosis not present

## 2020-07-20 DIAGNOSIS — R262 Difficulty in walking, not elsewhere classified: Secondary | ICD-10-CM | POA: Diagnosis not present

## 2020-07-20 DIAGNOSIS — M6281 Muscle weakness (generalized): Secondary | ICD-10-CM

## 2020-07-20 DIAGNOSIS — R2689 Other abnormalities of gait and mobility: Secondary | ICD-10-CM | POA: Diagnosis not present

## 2020-07-20 NOTE — Therapy (Signed)
[facility] [address] [place], [place], [address] Phone: [phone]   Fax:  [phone]  Physical Therapy Treatment  Patient Details  Name: Shane Ford MRN: [id number] Date of Birth: 04-28-1946 Referring Provider (PT): Jennings Books, MD   Encounter Date: 07/20/2020   PT End of Session - 07/20/20 0937    Visit Number 6    Number of Visits 13    Date for PT Re-Evaluation 08/28/20    Authorization Type [employer]    Progress Note Due on Visit 10    PT Start Time [id number]   patient arriving late   PT Stop Time 1015    PT Time Calculation (min) 41 min    Equipment Utilized During Treatment Gait belt    Activity Tolerance Patient tolerated treatment well    Behavior During Therapy [facility] for tasks assessed/performed           History reviewed. No pertinent past medical history.  History reviewed. No pertinent surgical history.  There were no vitals filed for this visit.   Subjective Assessment - 07/20/20 0938    Subjective Patient reports no changes since last visit. Patient reports that wrist is feeling better. No new falls.    Pertinent History Anxiety, Hyperlipidemia, Depression, DM Type 2, GERD, HTN, Cataracts, OA, Psoriatic Arhtropathy, Mild Cognitive Impairment    Limitations Walking    Patient Stated Goals Walk a straight line; feel more comfortable with the balance.    Currently in Pain? No/denies    Pain Onset Efrain Sella PT Assessment - 07/20/20 0001      Functional Gait  Assessment   Gait assessed  Yes    Gait Level Surface Walks 20 ft in less than 7 sec but greater than 5.5 sec, uses assistive device, slower speed, mild gait deviations, or deviates 6-10 in outside of the 12 in walkway width.    Change in Gait Speed Able to smoothly change walking speed without loss of balance or gait deviation. Deviate no more than 6 in outside of the 12 in walkway width.    Gait with  Horizontal Head Turns Performs head turns smoothly with slight change in gait velocity (eg, minor disruption to smooth gait path), deviates 6-10 in outside 12 in walkway width, or uses an assistive device.    Gait with Vertical Head Turns Performs task with slight change in gait velocity (eg, minor disruption to smooth gait path), deviates 6 - 10 in outside 12 in walkway width or uses assistive device    Gait and Pivot Turn Pivot turns safely within 3 sec and stops quickly with no loss of balance.    Step Over Obstacle Is able to step over one shoe box (4.5 in total height) without changing gait speed. No evidence of imbalance.    Gait with Narrow Base of Support Ambulates 7-9 steps.    Gait with Eyes Closed Walks 20 ft, uses assistive device, slower speed, mild gait deviations, deviates 6-10 in outside 12 in walkway width. Ambulates 20 ft in less than 9 sec but greater than 7 sec.    Ambulating Backwards Walks 20 ft, uses assistive device, slower speed, mild gait deviations, deviates 6-10 in outside 12 in walkway width.    Steps Alternating feet, no rail.    Total Score 23    FGA comment: 23/30  [facility] Adult PT Treatment/Exercise - 07/20/20 0001      Neuro Re-ed    Neuro Re-ed Details  Completed M-CTSIB: patien able to hold siutation 1-3 for dull 30 seconds, situation 4: avg 26 secs.      Exercises   Exercises Knee/Hip      Knee/Hip Exercises: Aerobic   Other Aerobic Completed SciFit on level 2.5 x 8 minutes with BLE/BUE for improved strengthening/endurance as warm up prior to balance activites.               Balance Exercises - 07/20/20 0001      Balance Exercises: Standing   Standing Eyes Closed Narrow base of support (BOS);Head turns;Foam/compliant surface;3 reps;30 secs;Limitations    Standing Eyes Closed Limitations completed 3 x 30 seconds with EC, completed 1 x 10 reps with horizontal/vertical head turns with EC    Other Standing Exercises standing on incline:  completed alteranting toe taps to pebbles with eyes open x 15 reps forward, then x 15 reps cross over. no UE support. Completed marching 2 x 30 secs with eyes open, 2 x 30 seconds with eyes closed. Completed staggered stance 2 x 30 seconds each with horizontal/vertical head turns. CGA required throughout.             PT Education - 07/20/20 [phone]    Education Details progress toward STGs.    Person(s) Educated Patient    Methods Explanation    Comprehension Verbalized understanding            PT Short Term Goals - 07/20/20 0950      PT SHORT TERM GOAL #1   Title Patient will be independent with initial balance HEP (All STGS Due: 07/20/20)    Baseline reports independence and complaince    Time 3    Period Weeks    Status Achieved    Target Date 07/20/20      PT SHORT TERM GOAL #2   Title Patient will demonstrate ability to hold situation 3 of M-CTSIB for full 30 seconds to demo improved balance.    Baseline 25 secs; 30 secs    Time 3    Period Weeks    Status Achieved      PT SHORT TERM GOAL #3   Title Patient will improve FGA to >/= 20 to demonstrate improved balance and reduced fall risk    Baseline 18/30; 23/30    Time 3    Period Weeks    Status Achieved             PT Long Term Goals - 06/29/20 1003      PT LONG TERM GOAL #1   Title Patient wil be independent with final balance HEP (all LTGS due: 08/10/20)    Baseline no HEP established    Time 6    Period Weeks    Status New    Target Date 08/10/20      PT LONG TERM GOAL #2   Title Patient will be able to hold situation 4 of M-CTSIB for >/= 20 seconds to demonstrate improved balance    Baseline < 5 seconds    Time 6    Period Weeks    Status New      PT LONG TERM GOAL #3   Title Patient will improve FGA to >/= 23/30 to demonstrate improved balance and reduced fall risk    Baseline 18/30    Time 6    Period Weeks    Status New  PT LONG TERM GOAL #4   Title Patient will demo ability to  ambulate > 500 ft on unlevel outdoors surfaces Mod I with LRAD vs. no AD to demonstrate improved community mobility    Baseline TBA    Time 6    Period Weeks    Status New      PT LONG TERM GOAL #5   Title Patient will be educated on fall prevention strategies within the home to promote improved safety and reduced fall risk.    Baseline dependent    Time 6    Period Weeks    Status New                 Plan - 07/20/20 [id number]    Clinical Impression Statement Today's skilled PT session focused on assessment of patient's progress toward STGs. Patient able to meet all STGs today during session, demosntrating improved balance. Patient improved FGA to 23/30 demonstrating reduced fall risk. Patient also demonstrating improved balance with M-CTSIB able to hold situation 3 for 30 seconds, situation 4 for 26 seconds. Continued high level balance as tolerated. Will continue to progress toward all LTGs.    Personal Factors and Comorbidities Age;Comorbidity 1    Comorbidities Anxiety, Hyperlipidemia, Depression, DM Type 2, GERD, HTN, Cataracts, OA, Psoriatic Arhtropathy, Mild Cognitive Impairment    Examination-Activity Limitations Stairs;Locomotion Level;Transfers    Examination-Participation Restrictions Yard Work;Community Activity;Driving    Stability/Clinical Decision Making Evolving/Moderate complexity    Rehab Potential Good    PT Frequency 2x / week    PT Duration 6 weeks    PT Treatment/Interventions ADLs/Self Care Home Management;Electrical Stimulation;Cryotherapy;Moist Heat;DME Instruction;Stair training;Functional mobility training;Therapeutic exercise;Gait training;Balance training;Neuromuscular re-education;Therapeutic activities;Patient/family education;Orthotic Fit/Training;Vestibular;Passive range of motion;Manual techniques    PT Next Visit Plan Update HEP. Add in Ankle Strengthing (heel/toe raises). Continued high level balance (with cognitive challenge)    Consulted and Agree  with Plan of Care Patient           Patient will benefit from skilled therapeutic intervention in order to improve the following deficits and impairments:  Decreased balance,Difficulty walking,Abnormal gait,Impaired sensation,Decreased strength,Decreased activity tolerance,Dizziness  Visit Diagnosis: Unsteadiness on feet  Other abnormalities of gait and mobility  Repeated falls  Muscle weakness (generalized)     Problem List There are no problems to display for this patient.   Jones Bales, PT, DPT 07/20/2020, 11:11 AM  [facility] [address] [place], [place], [address] Phone: [phone]   Fax:  [phone]  Name: Shane Ford MRN: [id number] Date of Birth: [birth year]

## 2020-07-21 DIAGNOSIS — R413 Other amnesia: Secondary | ICD-10-CM | POA: Diagnosis not present

## 2020-07-21 DIAGNOSIS — F32A Depression, unspecified: Secondary | ICD-10-CM | POA: Diagnosis not present

## 2020-07-21 DIAGNOSIS — L4050 Arthropathic psoriasis, unspecified: Secondary | ICD-10-CM | POA: Diagnosis not present

## 2020-07-21 DIAGNOSIS — E119 Type 2 diabetes mellitus without complications: Secondary | ICD-10-CM | POA: Diagnosis not present

## 2020-07-22 ENCOUNTER — Other Ambulatory Visit: Payer: Self-pay

## 2020-07-22 ENCOUNTER — Ambulatory Visit: Payer: Medicare HMO

## 2020-07-22 DIAGNOSIS — M6281 Muscle weakness (generalized): Secondary | ICD-10-CM | POA: Diagnosis not present

## 2020-07-22 DIAGNOSIS — R296 Repeated falls: Secondary | ICD-10-CM | POA: Diagnosis not present

## 2020-07-22 DIAGNOSIS — R2689 Other abnormalities of gait and mobility: Secondary | ICD-10-CM

## 2020-07-22 DIAGNOSIS — R2681 Unsteadiness on feet: Secondary | ICD-10-CM | POA: Diagnosis not present

## 2020-07-22 DIAGNOSIS — R262 Difficulty in walking, not elsewhere classified: Secondary | ICD-10-CM | POA: Diagnosis not present

## 2020-07-22 NOTE — Patient Instructions (Signed)
Access Code: [email or web address] URL: [email or web address] Date: 07/22/2020 Prepared by: Baldomero Lamy  Exercises Standing Balance with Eyes Closed on Foam - 1 x daily - 5 x weekly - 1 sets - 3 reps - 30 hold Romberg Stance on Foam Pad with Head Rotation - 1 x daily - 5 x weekly - 3 sets - 10 reps Romberg Stance with Head Nods on Foam Pad - 1 x daily - 5 x weekly - 2 sets - 10 reps Standing Romberg to 3/4 Tandem Stance - 1 x daily - 5 x weekly - 1 sets - 3 reps - 30 hold Standing Single Leg Stance with Counter Support - 1 x daily - 7 x weekly - 3 sets - 10 reps Tandem Walking with Counter Support - 1 x daily - 5 x weekly - 1 sets - 5 reps Heel Toe Raises with Counter Support - 1 x daily - 5 x weekly - 2 sets - 10 reps Heel Walking with Countertop Support - 1 x daily - 5 x weekly - 1 sets - 4 reps Standing Toe Taps at Counter - 1 x daily - 5 x weekly - 2 sets - 10 reps

## 2020-07-22 NOTE — Therapy (Signed)
[facility] [address] [place], [place], [address] Phone: [phone]   Fax:  [phone]  Physical Therapy Treatment  Patient Details  Name: Shane Ford MRN: [id number] Date of Birth: 1946-01-19 Referring Provider (PT): Jennings Books, MD   Encounter Date: 07/22/2020   PT End of Session - 07/22/20 0934    Visit Number 7    Number of Visits 13    Date for PT Re-Evaluation 08/28/20    Authorization Type [employer]    Progress Note Due on Visit 10    PT Start Time 0932    PT Stop Time 1012    PT Time Calculation (min) 40 min    Equipment Utilized During Treatment Gait belt    Activity Tolerance Patient tolerated treatment well    Behavior During Therapy [facility] for tasks assessed/performed           History reviewed. No pertinent past medical history.  History reviewed. No pertinent surgical history.  There were no vitals filed for this visit.   Subjective Assessment - 07/22/20 0934    Subjective No falls or new changes to report.    Pertinent History Anxiety, Hyperlipidemia, Depression, DM Type 2, GERD, HTN, Cataracts, OA, Psoriatic Arhtropathy, Mild Cognitive Impairment    Limitations Walking    Patient Stated Goals Walk a straight line; feel more comfortable with the balance.    Currently in Pain? No/denies    Pain Onset Efrain Sella Adult PT Treatment/Exercise - 07/22/20 0001      Ambulation/Gait   Ambulation/Gait Yes    Ambulation/Gait Assistance 5: Supervision    Ambulation/Gait Assistance Details throughut therapy gym with activities and high level balance    Assistive device None    Gait Pattern Step-through pattern;Poor foot clearance - left;Poor foot clearance - right    Ambulation Surface Level;Indoor      High Level Balance   High Level Balance Activities Side stepping;Backward walking;Marching forwards;Negotiating over obstacles    High Level Balance Comments  In hallway: completed side stepping 2 x 30', close supervision. Completed marching forward 2 x 30', then completed marching with dual task of counting backwards by 2's and 3's. increased cahllenge with dual task. Completed walking forward/backwards with manual task of self ball toss, increased challenge withbackwards completed 2 x 25' each direction. Completed obstalce course including stepping over tall orange hurdles and alternating side tap to cone, completed x 4 laps down and back.      Exercises   Exercises Knee/Hip      Knee/Hip Exercises: Aerobic   Other Aerobic Completed SciFit on level 3.0  x 8 minutes with BLE/BUE for improved strengthening/endurance as warm up prior to balance activites.      Knee/Hip Exercises: Standing   Other Standing Knee Exercises Completed Heel/Toe Raises, x 10 reps. verbal cues for form. Added to HEP.               Balance Exercises - 07/22/20 0001      Balance Exercises: Standing   SLS with Vectors Solid surface;Foam/compliant surface;Intermittent upper extremity assist;Limitations    SLS with Vectors Limitations completed alternating toe taps to 4" step, x 10 reps on firm surface. then proressed to single pillow x 10 reps. educated on completion at home.    Other Standing Exercises completed heel walking x 3 laps down and back in // bars, light UE support  HEP Update:    Access Code: [email or web address] URL: [email or web address] Date: 07/22/2020 Prepared by: Baldomero Lamy  Exercises Standing Balance with Eyes Closed on Foam - 1 x daily - 5 x weekly - 1 sets - 3 reps - 30 hold Romberg Stance on Foam Pad with Head Rotation - 1 x daily - 5 x weekly - 3 sets - 10 reps Romberg Stance with Head Nods on Foam Pad - 1 x daily - 5 x weekly - 2 sets - 10 reps Standing Romberg to 3/4 Tandem Stance - 1 x daily - 5 x weekly - 1 sets - 3 reps - 30 hold Standing Single Leg Stance with Counter Support - 1 x daily - 7 x weekly - 3 sets - 10  reps Tandem Walking with Counter Support - 1 x daily - 5 x weekly - 1 sets - 5 reps Heel Toe Raises with Counter Support - 1 x daily - 5 x weekly - 2 sets - 10 reps Heel Walking with Countertop Support - 1 x daily - 5 x weekly - 1 sets - 4 reps Standing Toe Taps at Counter - 1 x daily - 5 x weekly - 2 sets - 10 reps    PT Education - 07/22/20 0937    Education Details HEP Update    Person(s) Educated Patient    Methods Explanation;Handout    Comprehension Verbalized understanding            PT Short Term Goals - 07/20/20 0950      PT SHORT TERM GOAL #1   Title Patient will be independent with initial balance HEP (All STGS Due: 07/20/20)    Baseline reports independence and complaince    Time 3    Period Weeks    Status Achieved    Target Date 07/20/20      PT SHORT TERM GOAL #2   Title Patient will demonstrate ability to hold situation 3 of M-CTSIB for full 30 seconds to demo improved balance.    Baseline 25 secs; 30 secs    Time 3    Period Weeks    Status Achieved      PT SHORT TERM GOAL #3   Title Patient will improve FGA to >/= 20 to demonstrate improved balance and reduced fall risk    Baseline 18/30; 23/30    Time 3    Period Weeks    Status Achieved             PT Long Term Goals - 06/29/20 1003      PT LONG TERM GOAL #1   Title Patient wil be independent with final balance HEP (all LTGS due: 08/10/20)    Baseline no HEP established    Time 6    Period Weeks    Status New    Target Date 08/10/20      PT LONG TERM GOAL #2   Title Patient will be able to hold situation 4 of M-CTSIB for >/= 20 seconds to demonstrate improved balance    Baseline < 5 seconds    Time 6    Period Weeks    Status New      PT LONG TERM GOAL #3   Title Patient will improve FGA to >/= 23/30 to demonstrate improved balance and reduced fall risk    Baseline 18/30    Time 6    Period Weeks    Status New      PT LONG TERM GOAL #4  Title Patient will demo ability to  ambulate > 500 ft on unlevel outdoors surfaces Mod I with LRAD vs. no AD to demonstrate improved community mobility    Baseline TBA    Time 6    Period Weeks    Status New      PT LONG TERM GOAL #5   Title Patient will be educated on fall prevention strategies within the home to promote improved safety and reduced fall risk.    Baseline dependent    Time 6    Period Weeks    Status New                 Plan - 07/22/20 0936    Clinical Impression Statement Today's skilled sesion focused on continued high level balance activites working on improved dual tasking. Increased challenge noted with dual task activites. Updated HEP to further include ankle strengthening to promote improved ankle strategies for balance. Will continue to progress toward all LTGs.    Personal Factors and Comorbidities Age;Comorbidity 1    Comorbidities Anxiety, Hyperlipidemia, Depression, DM Type 2, GERD, HTN, Cataracts, OA, Psoriatic Arhtropathy, Mild Cognitive Impairment    Examination-Activity Limitations Stairs;Locomotion Level;Transfers    Examination-Participation Restrictions Yard Work;Community Activity;Driving    Stability/Clinical Decision Making Evolving/Moderate complexity    Rehab Potential Good    PT Frequency 2x / week    PT Duration 6 weeks    PT Treatment/Interventions ADLs/Self Care Home Management;Electrical Stimulation;Cryotherapy;Moist Heat;DME Instruction;Stair training;Functional mobility training;Therapeutic exercise;Gait training;Balance training;Neuromuscular re-education;Therapeutic activities;Patient/family education;Orthotic Fit/Training;Vestibular;Passive range of motion;Manual techniques    PT Next Visit Plan Update HEP. Add in Ankle Strengthing (heel/toe raises). Continued high level balance (with cognitive challenge)    Consulted and Agree with Plan of Care Patient           Patient will benefit from skilled therapeutic intervention in order to improve the following  deficits and impairments:  Decreased balance,Difficulty walking,Abnormal gait,Impaired sensation,Decreased strength,Decreased activity tolerance,Dizziness  Visit Diagnosis: Unsteadiness on feet  Other abnormalities of gait and mobility  Repeated falls  Muscle weakness (generalized)     Problem List There are no problems to display for this patient.   Jones Bales, PT, DPT 07/22/2020, 10:16 AM  [facility] [address] [place], [place], [address] Phone: [phone]   Fax:  [phone]  Name: Jonas Goh MRN: [id number] Date of Birth: 1945-10-23

## 2020-07-27 ENCOUNTER — Other Ambulatory Visit: Payer: Self-pay

## 2020-07-27 ENCOUNTER — Ambulatory Visit: Payer: Medicare HMO

## 2020-07-27 DIAGNOSIS — R2681 Unsteadiness on feet: Secondary | ICD-10-CM | POA: Diagnosis not present

## 2020-07-27 DIAGNOSIS — M6281 Muscle weakness (generalized): Secondary | ICD-10-CM | POA: Diagnosis not present

## 2020-07-27 DIAGNOSIS — R296 Repeated falls: Secondary | ICD-10-CM | POA: Diagnosis not present

## 2020-07-27 DIAGNOSIS — R262 Difficulty in walking, not elsewhere classified: Secondary | ICD-10-CM | POA: Diagnosis not present

## 2020-07-27 DIAGNOSIS — R2689 Other abnormalities of gait and mobility: Secondary | ICD-10-CM | POA: Diagnosis not present

## 2020-07-27 NOTE — Therapy (Signed)
[facility] [address] [place], [place], [address] Phone: [phone]   Fax:  [phone]  Physical Therapy Treatment  Patient Details  Name: Shane Ford MRN: [id number] Date of Birth: 1946-05-04 Referring Provider (PT): Jennings Books, MD   Encounter Date: 07/27/2020   PT End of Session - 07/27/20 1025    Visit Number 8    Number of Visits 13    Date for PT Re-Evaluation 08/28/20    Authorization Type [employer]    Progress Note Due on Visit 10    PT Start Time 0930    PT Stop Time 1015    PT Time Calculation (min) 45 min    Equipment Utilized During Treatment Gait belt    Activity Tolerance Patient tolerated treatment well;Patient limited by fatigue    Behavior During Therapy [facility] for tasks assessed/performed           History reviewed. No pertinent past medical history.  History reviewed. No pertinent surgical history.  There were no vitals filed for this visit.   Subjective Assessment - 07/27/20 0939    Subjective No falls or new changes to report.  Some mild arhtritis soreness with the cold weather    Pertinent History Anxiety, Hyperlipidemia, Depression, DM Type 2, GERD, HTN, Cataracts, OA, Psoriatic Arhtropathy, Mild Cognitive Impairment    Limitations Walking    Patient Stated Goals Walk a straight line; feel more comfortable with the balance.    Currently in Pain? No/denies                             [facility] Adult PT Treatment/Exercise - 07/27/20 0001      Ambulation/Gait   Ambulation/Gait Yes    Ambulation/Gait Assistance 5: Supervision    Ambulation/Gait Assistance Details 230' w/o AD under S w/o LOB and able to keep straight line    Assistive device None    Gait Pattern Step-through pattern    Ambulation Surface Level;Indoor      High Level Balance   High Level Balance Activities Side stepping    High Level Balance Comments perfromed sidestepping from airex to  4" step 2x15, sidestep as well as front step.  CGA assist needed due to fatigue.      Knee/Hip Exercises: Aerobic   Other Aerobic Completed SciFit on level 3.0  x 10 minutes with BLE/BUE for improved strengthening/endurance as warm up prior to balance activites.      Knee/Hip Exercises: Standing   Other Standing Knee Exercises Completed Heel/Toe Raises, 2 x 15 reps. verbal cues for form. Added to HEP.               Balance Exercises - 07/27/20 0001      Balance Exercises: Standing   SLS with Vectors Solid surface   15 reps   SLS with Vectors Limitations completed alternating toe taps to ball x 15 reps on firm surface A/P and sidestep using counter for UE spport    Other Standing Exercises completed heel walking x 5 laps down and back in // bars, light UE support, added crossovers for 5 trips in // bars               PT Short Term Goals - 07/20/20 0950      PT SHORT TERM GOAL #1   Title Patient will be independent with initial balance HEP (All STGS Due: 07/20/20)    Baseline reports independence and  complaince    Time 3    Period Weeks    Status Achieved    Target Date 07/20/20      PT SHORT TERM GOAL #2   Title Patient will demonstrate ability to hold situation 3 of M-CTSIB for full 30 seconds to demo improved balance.    Baseline 25 secs; 30 secs    Time 3    Period Weeks    Status Achieved      PT SHORT TERM GOAL #3   Title Patient will improve FGA to >/= 20 to demonstrate improved balance and reduced fall risk    Baseline 18/30; 23/30    Time 3    Period Weeks    Status Achieved             PT Long Term Goals - 06/29/20 1003      PT LONG TERM GOAL #1   Title Patient wil be independent with final balance HEP (all LTGS due: 08/10/20)    Baseline no HEP established    Time 6    Period Weeks    Status New    Target Date 08/10/20      PT LONG TERM GOAL #2   Title Patient will be able to hold situation 4 of M-CTSIB for >/= 20 seconds to demonstrate  improved balance    Baseline < 5 seconds    Time 6    Period Weeks    Status New      PT LONG TERM GOAL #3   Title Patient will improve FGA to >/= 23/30 to demonstrate improved balance and reduced fall risk    Baseline 18/30    Time 6    Period Weeks    Status New      PT LONG TERM GOAL #4   Title Patient will demo ability to ambulate > 500 ft on unlevel outdoors surfaces Mod I with LRAD vs. no AD to demonstrate improved community mobility    Baseline TBA    Time 6    Period Weeks    Status New      PT LONG TERM GOAL #5   Title Patient will be educated on fall prevention strategies within the home to promote improved safety and reduced fall risk.    Baseline dependent    Time 6    Period Weeks    Status New                 Plan - 07/27/20 1025    Clinical Impression Statement Advanced to high level balance tasks with patient showing fatigue and mild loss of coordination as treatment wore on despite no c/o fatigue.  Patient would benefit from rest breaks and acknowledgement of fatigue to not allow performance to degrade.  He was able to advance tasks to no need of UE support but SBA/CGA needed for safety    Personal Factors and Comorbidities Age;Comorbidity 1    Comorbidities Anxiety, Hyperlipidemia, Depression, DM Type 2, GERD, HTN, Cataracts, OA, Psoriatic Arhtropathy, Mild Cognitive Impairment    Examination-Activity Limitations Stairs;Locomotion Level;Transfers    Examination-Participation Restrictions Yard Work;Community Activity;Driving    Stability/Clinical Decision Making Evolving/Moderate complexity    Clinical Decision Making Low    Rehab Potential Good    PT Frequency 2x / week    PT Duration 6 weeks    PT Treatment/Interventions ADLs/Self Care Home Management;Electrical Stimulation;Cryotherapy;Moist Heat;DME Instruction;Stair training;Functional mobility training;Therapeutic exercise;Gait training;Balance training;Neuromuscular re-education;Therapeutic  activities;Patient/family education;Orthotic Fit/Training;Vestibular;Passive range of motion;Manual techniques  PT Next Visit Plan Continue high level strength and balance training both staic and dynamic, crossover and braiding activity encouraged as well as incorporating cognitive challenges with balance tasks    Consulted and Agree with Plan of Care Patient           Patient will benefit from skilled therapeutic intervention in order to improve the following deficits and impairments:  Decreased balance,Difficulty walking,Abnormal gait,Impaired sensation,Decreased strength,Decreased activity tolerance,Dizziness  Visit Diagnosis: Unsteadiness on feet  Muscle weakness (generalized)     Problem List There are no problems to display for this patient.   Lanice Shirts PT 07/27/2020, 11:04 AM  [facility] [address] [place], [place], [address] Phone: [phone]   Fax:  [phone]  Name: Stony Stegmann MRN: [id number] Date of Birth: 01-Nov-1945

## 2020-07-29 ENCOUNTER — Ambulatory Visit: Payer: Medicare HMO | Attending: Neurology

## 2020-07-29 ENCOUNTER — Other Ambulatory Visit: Payer: Self-pay

## 2020-07-29 DIAGNOSIS — R2689 Other abnormalities of gait and mobility: Secondary | ICD-10-CM | POA: Diagnosis not present

## 2020-07-29 DIAGNOSIS — R2681 Unsteadiness on feet: Secondary | ICD-10-CM | POA: Diagnosis not present

## 2020-07-29 DIAGNOSIS — M6281 Muscle weakness (generalized): Secondary | ICD-10-CM | POA: Insufficient documentation

## 2020-07-29 NOTE — Therapy (Signed)
[facility] [address] [place], [place], [address] Phone: [phone]   Fax:  [phone]  Physical Therapy Treatment  Patient Details  Name: Shane Ford MRN: [id number] Date of Birth: 02-26-1946 Referring Provider (PT): Jennings Books, MD   Encounter Date: 07/29/2020   PT End of Session - 07/29/20 0936    Visit Number 9    Number of Visits 13    Date for PT Re-Evaluation 08/28/20    Authorization Type [employer]    Progress Note Due on Visit 10    PT Start Time 0930    PT Stop Time 1015    PT Time Calculation (min) 45 min    Equipment Utilized During Treatment Gait belt    Activity Tolerance Patient tolerated treatment well;Patient limited by fatigue    Behavior During Therapy [facility] for tasks assessed/performed           History reviewed. No pertinent past medical history.  History reviewed. No pertinent surgical history.  There were no vitals filed for this visit.   Subjective Assessment - 07/29/20 0933    Subjective No falls or new changes to report. Feels he isprogressing at home but feels he needs more strength and overall balance training    Pertinent History Anxiety, Hyperlipidemia, Depression, DM Type 2, GERD, HTN, Cataracts, OA, Psoriatic Arhtropathy, Mild Cognitive Impairment    Limitations Walking    Patient Stated Goals Walk a straight line; feel more comfortable with the balance. Get stronger    Currently in Pain? No/denies                             [facility] Adult PT Treatment/Exercise - 07/29/20 0001      High Level Balance   High Level Balance Activities Braiding    High Level Balance Comments peformed 5 trips in // bars light CGA as well as cuing for pacing and proper form      Knee/Hip Exercises: Aerobic   Other Aerobic Completed SciFit on level 3.0  x 10 minutes with BLE/BUE for improved strengthening/endurance as warm up prior to balance activites.       Knee/Hip Exercises: Standing   Forward Step Up 2 sets;15 reps;Hand Hold: 1    Forward Step Up Limitations performed from airex to second step on clinic steps, alternating pattern.    Functional Squat 2 sets;15 reps    Functional Squat Limitations perfromed on airex w/o need of UE support    Other Standing Knee Exercises Completed Heel/Toe Raises, 2 x 15 reps. verbal cues for form.  Performed in corner over single pillow, patient to begin HEP   advanced difficulty by having patient perform on single pillow in corner.              Balance Exercises - 07/29/20 0001      Balance Exercises: Standing   SLS with Vectors Foam/compliant surface;Upper extremity assist 1;30 secs;2 reps    SLS with Vectors Limitations completed alternating toe taps to ball x 15 reps on airex surface A/P and sidestep using counter for UE spport    Tandem Gait Forward;Retro;5 reps    Tandem Gait Limitations tandem walking for 5 trips in bars UE support as needed encouraging patient to use minimal support of bars. utilized blue foam beam    Other Standing Exercises completed heel walking x 5 laps down and back in // bars, light UE support, added crossovers for 5  trips in // bars    Other Standing Exercises Comments on blue mat: marching forwards and retro gait on blue mat               PT Short Term Goals - 07/20/20 0950      PT SHORT TERM GOAL #1   Title Patient will be independent with initial balance HEP (All STGS Due: 07/20/20)    Baseline reports independence and complaince    Time 3    Period Weeks    Status Achieved    Target Date 07/20/20      PT SHORT TERM GOAL #2   Title Patient will demonstrate ability to hold situation 3 of M-CTSIB for full 30 seconds to demo improved balance.    Baseline 25 secs; 30 secs    Time 3    Period Weeks    Status Achieved      PT SHORT TERM GOAL #3   Title Patient will improve FGA to >/= 20 to demonstrate improved balance and reduced fall risk    Baseline  18/30; 23/30    Time 3    Period Weeks    Status Achieved             PT Long Term Goals - 06/29/20 1003      PT LONG TERM GOAL #1   Title Patient wil be independent with final balance HEP (all LTGS due: 08/10/20)    Baseline no HEP established    Time 6    Period Weeks    Status New    Target Date 08/10/20      PT LONG TERM GOAL #2   Title Patient will be able to hold situation 4 of M-CTSIB for >/= 20 seconds to demonstrate improved balance    Baseline < 5 seconds    Time 6    Period Weeks    Status New      PT LONG TERM GOAL #3   Title Patient will improve FGA to >/= 23/30 to demonstrate improved balance and reduced fall risk    Baseline 18/30    Time 6    Period Weeks    Status New      PT LONG TERM GOAL #4   Title Patient will demo ability to ambulate > 500 ft on unlevel outdoors surfaces Mod I with LRAD vs. no AD to demonstrate improved community mobility    Baseline TBA    Time 6    Period Weeks    Status New      PT LONG TERM GOAL #5   Title Patient will be educated on fall prevention strategies within the home to promote improved safety and reduced fall risk.    Baseline dependent    Time 6    Period Weeks    Status New                 Plan - 07/29/20 0937    Clinical Impression Statement Incorporated more frequent rest breaks to accomodate fatigue and breakdown in performance, increased difficulty of high level balance actvities w/o any dicomfort or gross LOB, requiring less support from UEs or PT to maintain balance.  Would benefit from dual tasking activities and continue with braiding and crossover tasks as this is where patient demos most deficits    Personal Factors and Comorbidities Age;Comorbidity 1    Comorbidities Anxiety, Hyperlipidemia, Depression, DM Type 2, GERD, HTN, Cataracts, OA, Psoriatic Arhtropathy, Mild Cognitive Impairment    Examination-Activity Limitations  Stairs;Locomotion Level;Transfers    Examination-Participation  Restrictions Yard Work;Community Activity;Driving    Stability/Clinical Decision Making Evolving/Moderate complexity    Rehab Potential Good    PT Frequency 2x / week    PT Duration 6 weeks    PT Treatment/Interventions ADLs/Self Care Home Management;Electrical Stimulation;Cryotherapy;Moist Heat;DME Instruction;Stair training;Functional mobility training;Therapeutic exercise;Gait training;Balance training;Neuromuscular re-education;Therapeutic activities;Patient/family education;Orthotic Fit/Training;Vestibular;Passive range of motion;Manual techniques    PT Next Visit Plan Increase difficulty of exercises to facilitate strength, increase resstance on Scifit, assess goals?    Consulted and Agree with Plan of Care Patient           Patient will benefit from skilled therapeutic intervention in order to improve the following deficits and impairments:  Decreased balance,Difficulty walking,Abnormal gait,Impaired sensation,Decreased strength,Decreased activity tolerance,Dizziness  Visit Diagnosis: Unsteadiness on feet  Muscle weakness (generalized)     Problem List There are no problems to display for this patient.   Lanice Shirts PT 07/29/2020, 10:40 AM  [facility] [address] [place], [place], [address] Phone: [phone]   Fax:  [phone]  Name: Shane Ford MRN: [id number] Date of Birth: Sep 09, 1945

## 2020-08-03 ENCOUNTER — Ambulatory Visit: Payer: Medicare HMO

## 2020-08-05 ENCOUNTER — Other Ambulatory Visit: Payer: Self-pay

## 2020-08-05 ENCOUNTER — Ambulatory Visit: Payer: Medicare HMO

## 2020-08-05 DIAGNOSIS — M6281 Muscle weakness (generalized): Secondary | ICD-10-CM | POA: Diagnosis not present

## 2020-08-05 DIAGNOSIS — R2689 Other abnormalities of gait and mobility: Secondary | ICD-10-CM | POA: Diagnosis not present

## 2020-08-05 DIAGNOSIS — R2681 Unsteadiness on feet: Secondary | ICD-10-CM

## 2020-08-06 NOTE — Therapy (Signed)
[facility] [address] [place], [place], [address] Phone: [phone]   Fax:  [phone]  Physical Therapy Treatment/ Discharge Summary  Patient Details  Name: Shane Ford MRN: [id number] Date of Birth: 31-Jan-1946 Referring Provider (PT): Jennings Books, MD PHYSICAL THERAPY DISCHARGE SUMMARY  Visits from Start of Care: 10  Current functional level related to goals / functional outcomes: All goals met, patient has regained functional mobility and independence   Remaining deficits: None verbalized   Education / Equipment: HEP and fall prevention Plan: Patient agrees to discharge.  Patient goals were met. Patient is being discharged due to meeting the stated rehab goals.  ?????      Encounter Date: 08/05/2020   PT End of Session - 08/05/20 1345    Visit Number 10    Number of Visits 13    Date for PT Re-Evaluation 08/05/20    Authorization Type [employer]    Progress Note Due on Visit 10    PT Start Time 0930    PT Stop Time 1015    PT Time Calculation (min) 45 min    Equipment Utilized During Treatment Gait belt    Activity Tolerance Patient tolerated treatment well;Patient limited by fatigue    Behavior During Therapy WFL for tasks assessed/performed           No past medical history on file.  No past surgical history on file.  There were no vitals filed for this visit.   Subjective Assessment - 08/05/20 1344    Subjective Patient reportshe has returned to his normal routine w/o and limitations regarding strength and balance deficits, he is in agreement to DC to HEP    Pertinent History Anxiety, Hyperlipidemia, Depression, DM Type 2, GERD, HTN, Cataracts, OA, Psoriatic Arhtropathy, Mild Cognitive Impairment    Limitations Walking    Patient Stated Goals Walk a straight line; feel more comfortable with the balance. Get stronger    Currently in Pain? No/denies             08/05/20  0001  Ambulation/Gait  Ambulation/Gait Yes  Ambulation/Gait Assistance 5: Supervision  Ambulation/Gait Assistance Details no LOB noted across all surfaces with outside ambulation  Ambulation Distance (Feet) 500 Feet  Assistive device None  Gait Pattern WFL  Ambulation Surface Level;Unlevel;Outdoor;Indoor;Grass;Paved  High Level Balance  High Level Balance Comments patient able to hold all positions of CTSIB for30s w/o LOB  High Level Balance  High Level Balance Comments modified CTSIB  Functional Gait  Assessment  Gait assessed  Yes  Gait Level Surface 3  Change in Gait Speed 3  Gait with Horizontal Head Turns 3  Gait with Vertical Head Turns 2  Gait and Pivot Turn 2  Step Over Obstacle 3  Gait with Narrow Base of Support 2  Gait with Eyes Closed 2  Ambulating Backwards 2  Steps 2  Total Score 24  FGA comment: 24/30        PT Short Term Goals - 08/05/20 1348      PT SHORT TERM GOAL #1   Title Patient will be independent with initial balance HEP (All STGS Due: 07/20/20)    Baseline reports independence and complaince    Time 3    Period Weeks    Status Achieved    Target Date 07/20/20      PT SHORT TERM GOAL #2   Title Patient will demonstrate ability to hold situation 3 of M-CTSIB for full 30 seconds to demo  improved balance.    Baseline 25 secs; 30 secs    Time 3    Period Weeks    Status Achieved      PT SHORT TERM GOAL #3   Title Patient will improve FGA to >/= 20 to demonstrate improved balance and reduced fall risk    Baseline 18/30; 23/30    Time 3    Period Weeks    Status Achieved             PT Long Term Goals - 08/05/20 1349      PT LONG TERM GOAL #1   Title Patient wil be independent with final balance HEP (all LTGS due: 08/10/20)    Baseline no HEP established    Time 6    Period Weeks    Status Achieved      PT LONG TERM GOAL #2   Title Patient will be able to hold situation 4 of M-CTSIB for >/= 20 seconds to demonstrate improved  balance    Baseline < 5 seconds: able to maintain all 4 positions for 30s w/o LOB at DC    Time 6    Period Weeks    Status Achieved      PT LONG TERM GOAL #3   Title Patient will improve FGA to >/= 23/30 to demonstrate improved balance and reduced fall risk    Baseline 18/30: 24/30 at DC    Time 6    Period Weeks    Status Achieved      PT LONG TERM GOAL #4   Title Patient will demo ability to ambulate > 500 ft on unlevel outdoors surfaces Mod I with LRAD vs. no AD to demonstrate improved community mobility    Baseline TBA: able to safely ambulate 534f+ w/o AD across outdoor surfaces I    Time 6    Period Weeks    Status Achieved      PT LONG TERM GOAL #5   Title Patient will be educated on fall prevention strategies within the home to promote improved safety and reduced fall risk.    Baseline dependent; patient able to recall fall prevention strategies at DC    Time 6    Period Weeks    Status Achieved                 Plan - 08/05/20 1346    Clinical Impression Statement Goals reviewed as noted, all STG/LTGs met, patient in agareement with DC to HEP as he does not note any strength or balance deficits limiting his activities    Personal Factors and Comorbidities Age;Comorbidity 1    Comorbidities Anxiety, Hyperlipidemia, Depression, DM Type 2, GERD, HTN, Cataracts, OA, Psoriatic Arhtropathy, Mild Cognitive Impairment    Examination-Activity Limitations Stairs;Locomotion Level;Transfers    Examination-Participation Restrictions Yard Work;Community Activity;Driving    Stability/Clinical Decision Making Evolving/Moderate complexity    Rehab Potential Good    PT Frequency 2x / week    PT Duration 6 weeks    PT Treatment/Interventions ADLs/Self Care Home Management;Electrical Stimulation;Cryotherapy;Moist Heat;DME Instruction;Stair training;Functional mobility training;Therapeutic exercise;Gait training;Balance training;Neuromuscular re-education;Therapeutic  activities;Patient/family education;Orthotic Fit/Training;Vestibular;Passive range of motion;Manual techniques    PT Next Visit Plan Patient DCed to HEP    Consulted and Agree with Plan of Care Patient           Patient will benefit from skilled therapeutic intervention in order to improve the following deficits and impairments:  Decreased balance,Difficulty walking,Abnormal gait,Impaired sensation,Decreased strength,Decreased activity tolerance,Dizziness  Visit Diagnosis: Unsteadiness  on feet  Muscle weakness (generalized)  Other abnormalities of gait and mobility     Problem List There are no problems to display for this patient.   Lanice Shirts 08/06/2020, 1:46 PM  [facility] [address] [place], [place], [address] Phone: [phone]   Fax:  [phone]  Name: Shane Ford MRN: [id number] Date of Birth: 07-31-1945

## 2020-08-19 ENCOUNTER — Ambulatory Visit: Payer: Medicare HMO | Admitting: Diagnostic Neuroimaging

## 2020-09-22 DIAGNOSIS — G3184 Mild cognitive impairment, so stated: Secondary | ICD-10-CM | POA: Diagnosis not present

## 2020-09-22 DIAGNOSIS — F3341 Major depressive disorder, recurrent, in partial remission: Secondary | ICD-10-CM | POA: Diagnosis not present

## 2020-10-26 DIAGNOSIS — Z85828 Personal history of other malignant neoplasm of skin: Secondary | ICD-10-CM | POA: Diagnosis not present

## 2020-10-26 DIAGNOSIS — D2271 Melanocytic nevi of right lower limb, including hip: Secondary | ICD-10-CM | POA: Diagnosis not present

## 2020-10-26 DIAGNOSIS — D2262 Melanocytic nevi of left upper limb, including shoulder: Secondary | ICD-10-CM | POA: Diagnosis not present

## 2020-10-26 DIAGNOSIS — X32XXXA Exposure to sunlight, initial encounter: Secondary | ICD-10-CM | POA: Diagnosis not present

## 2020-10-26 DIAGNOSIS — L570 Actinic keratosis: Secondary | ICD-10-CM | POA: Diagnosis not present

## 2020-10-26 DIAGNOSIS — D2261 Melanocytic nevi of right upper limb, including shoulder: Secondary | ICD-10-CM | POA: Diagnosis not present

## 2020-11-03 DIAGNOSIS — E538 Deficiency of other specified B group vitamins: Secondary | ICD-10-CM | POA: Diagnosis not present

## 2020-11-03 DIAGNOSIS — Z125 Encounter for screening for malignant neoplasm of prostate: Secondary | ICD-10-CM | POA: Diagnosis not present

## 2020-11-03 DIAGNOSIS — E1151 Type 2 diabetes mellitus with diabetic peripheral angiopathy without gangrene: Secondary | ICD-10-CM | POA: Diagnosis not present

## 2020-11-10 DIAGNOSIS — E119 Type 2 diabetes mellitus without complications: Secondary | ICD-10-CM | POA: Diagnosis not present

## 2020-11-10 DIAGNOSIS — E1151 Type 2 diabetes mellitus with diabetic peripheral angiopathy without gangrene: Secondary | ICD-10-CM | POA: Diagnosis not present

## 2020-11-10 DIAGNOSIS — L4050 Arthropathic psoriasis, unspecified: Secondary | ICD-10-CM | POA: Diagnosis not present

## 2020-11-10 DIAGNOSIS — F329 Major depressive disorder, single episode, unspecified: Secondary | ICD-10-CM | POA: Diagnosis not present

## 2020-11-10 DIAGNOSIS — R972 Elevated prostate specific antigen [PSA]: Secondary | ICD-10-CM | POA: Diagnosis not present

## 2020-11-10 DIAGNOSIS — Z0000 Encounter for general adult medical examination without abnormal findings: Secondary | ICD-10-CM | POA: Diagnosis not present

## 2020-11-10 DIAGNOSIS — E538 Deficiency of other specified B group vitamins: Secondary | ICD-10-CM | POA: Diagnosis not present

## 2020-12-23 DIAGNOSIS — R251 Tremor, unspecified: Secondary | ICD-10-CM | POA: Diagnosis not present

## 2020-12-23 DIAGNOSIS — G3184 Mild cognitive impairment, so stated: Secondary | ICD-10-CM | POA: Diagnosis not present

## 2020-12-23 DIAGNOSIS — W19XXXA Unspecified fall, initial encounter: Secondary | ICD-10-CM | POA: Diagnosis not present

## 2020-12-23 DIAGNOSIS — I10 Essential (primary) hypertension: Secondary | ICD-10-CM | POA: Diagnosis not present

## 2020-12-23 DIAGNOSIS — E538 Deficiency of other specified B group vitamins: Secondary | ICD-10-CM | POA: Diagnosis not present

## 2020-12-25 DIAGNOSIS — M4722 Other spondylosis with radiculopathy, cervical region: Secondary | ICD-10-CM | POA: Diagnosis not present

## 2020-12-31 DIAGNOSIS — L4050 Arthropathic psoriasis, unspecified: Secondary | ICD-10-CM | POA: Diagnosis not present

## 2020-12-31 DIAGNOSIS — Z79899 Other long term (current) drug therapy: Secondary | ICD-10-CM | POA: Diagnosis not present

## 2021-01-05 DIAGNOSIS — M5412 Radiculopathy, cervical region: Secondary | ICD-10-CM | POA: Diagnosis not present

## 2021-01-05 DIAGNOSIS — M6281 Muscle weakness (generalized): Secondary | ICD-10-CM | POA: Diagnosis not present

## 2021-01-06 ENCOUNTER — Emergency Department (HOSPITAL_COMMUNITY)
Admission: EM | Admit: 2021-01-06 | Discharge: 2021-01-06 | Disposition: A | Discharge status: Home / Self Care | Payer: Medicare HMO | Attending: Emergency Medicine | Admitting: Emergency Medicine

## 2021-01-06 ENCOUNTER — Encounter (HOSPITAL_COMMUNITY): Payer: Self-pay

## 2021-01-06 ENCOUNTER — Emergency Department (HOSPITAL_COMMUNITY): Payer: Medicare HMO

## 2021-01-06 ENCOUNTER — Other Ambulatory Visit: Payer: Self-pay

## 2021-01-06 DIAGNOSIS — R0789 Other chest pain: Secondary | ICD-10-CM | POA: Diagnosis not present

## 2021-01-06 DIAGNOSIS — M25512 Pain in left shoulder: Secondary | ICD-10-CM | POA: Insufficient documentation

## 2021-01-06 DIAGNOSIS — R079 Chest pain, unspecified: Secondary | ICD-10-CM | POA: Diagnosis not present

## 2021-01-06 LAB — BASIC METABOLIC PANEL
Anion gap: 7 (ref 5–15)
BUN: 13 mg/dL (ref 8–23)
CO2: 28 mmol/L (ref 22–32)
Calcium: 9.9 mg/dL (ref 8.9–10.3)
Chloride: 104 mmol/L (ref 98–111)
Creatinine, Ser: 1.11 mg/dL (ref 0.61–1.24)
GFR, Estimated: >60 mL/min (ref >60)
Glucose, Bld: 181 mg/dL — ABNORMAL HIGH (ref 70–99)
Potassium: 4.2 mmol/L (ref 3.5–5.1)
Sodium: 139 mmol/L (ref 135–145)

## 2021-01-06 LAB — CBC
HCT: 44.3 % (ref 39.0–52.0)
Hemoglobin: 14.7 g/dL (ref 13.0–17.0)
MCH: 30.3 pg (ref 26.0–34.0)
MCHC: 33.2 g/dL (ref 30.0–36.0)
MCV: 91.3 fL (ref 80.0–100.0)
Platelets: 211 10*3/uL (ref 150–400)
RBC: 4.85 MIL/uL (ref 4.22–5.81)
RDW: 13.1 % (ref 11.5–15.5)
WBC: 6.8 10*3/uL (ref 4.0–10.5)
nRBC: 0 % (ref 0.0–0.2)

## 2021-01-06 LAB — TROPONIN I (HIGH SENSITIVITY)
Troponin I (High Sensitivity): 11 ng/L (ref <18)
Troponin I (High Sensitivity): 13 ng/L (ref <18)

## 2021-01-06 MED ORDER — ACETAMINOPHEN 325 MG PO TABS
650.0000 mg | ORAL_TABLET | Freq: Once | ORAL | Status: AC
  Administered 2021-01-06: 650 mg via ORAL
  Filled 2021-01-06: qty 2

## 2021-01-06 MED ORDER — LISINOPRIL 10 MG PO TABS
5.0000 mg | ORAL_TABLET | Freq: Once | ORAL | Status: AC
  Administered 2021-01-06: 5 mg via ORAL
  Filled 2021-01-06: qty 1

## 2021-01-06 MED ORDER — METHOCARBAMOL 500 MG PO TABS
500.0000 mg | ORAL_TABLET | Freq: Once | ORAL | Status: AC
  Administered 2021-01-06: 500 mg via ORAL
  Filled 2021-01-06: qty 1

## 2021-01-06 MED ORDER — FENTANYL CITRATE (PF) 100 MCG/2ML IJ SOLN
25.0000 ug | Freq: Once | INTRAMUSCULAR | Status: AC
Start: 2021-01-06 — End: 2021-01-06
  Administered 2021-01-06: 25 ug via INTRAVENOUS
  Filled 2021-01-06: qty 2

## 2021-01-06 NOTE — ED Provider Notes (Signed)
Emergency Medicine Provider Triage Evaluation Note  Jefry Lesinski [facility] , a 75 y.o. male  was evaluated in triage.  Pt complains of presents with left-sided chest/shoulder pain.  Been going on for last 2 weeks, worsening over the last couple days, states she has pain that radiates down from his left shoulder down to his left arm, he has occasional paresthesias in use that arm, pain is worsened with arm movement, does not have associated shortness of breath, becoming diaphoretic, nausea or vomiting, denies dyspnea on exertion.  He does have a cardiac stent placed in 1998, has no other significant history..  Review of Systems  Positive: Left upper chest and left arm pain Negative: Shortness of breath diaphoretic  Physical Exam  BP (!) 171/89 (BP Location: Right Arm)   Pulse 76   Temp 97.7 F (36.5 C) (Oral)   Resp 15   SpO2 97%  Gen:   Awake, no distress   Resp:  Normal effort  MSK:   Moves extremities without difficulty  Other:    Medical Decision Making  Medically screening exam initiated at 4:11 PM.  Appropriate orders placed.  Reinhart Saulters [place] was informed that the remainder of the evaluation will be completed by another provider, this initial triage assessment does not replace that evaluation, and the importance of remaining in the ED until their evaluation is complete.  Patient presents with left upper chest and arm pain, labwork imaging have been ordered, patient need further work-up.   Marcello Fennel, PA-C 01/06/21 [place], Ankit, MD 01/06/21 1724

## 2021-01-06 NOTE — ED Provider Notes (Signed)
[facility] EMERGENCY DEPARTMENT Provider Note   CSN: [id number] Arrival date & time: 01/06/21  1537     History No chief complaint on file.   Shane Ford is a 75 y.o. male with a past medical history of diabetes, hypertension presenting to the ED with a chief complaint of left shoulder pain.  For the past 2 weeks has noticed pain in his left shoulder radiating down his left arm.  He reports associated paresthesias in his left hand.  He went to the orthopedist to have this evaluated with negative x-rays.  He has been prescribed gabapentin, prednisone, hydrocodone but continues to have pain.  Pain was worse last night and he was unable to sleep.  He has noticed intermittent left-sided chest pain for the past 2 days that he is unsure is related to the shoulder pain.  No shortness of breath.  No injuries or falls or any trigger for the shoulder pain 2 weeks ago.  Denies any neck pain, fever, shortness of breath, cough, vomiting.  HPI     History reviewed. No pertinent past medical history.  There are no problems to display for this patient.   History reviewed. No pertinent surgical history.     No family history on file.  Social History   Tobacco Use   Smoking status: Unknown    Home Medications Prior to Admission medications   Medication Sig Start Date End Date Taking? Authorizing Provider  aspirin EC 81 MG tablet Take 81 mg by mouth daily. Swallow whole.    [provider]  donepezil (ARICEPT) 5 MG tablet Take 5 mg by mouth at bedtime.    [provider]  fenofibrate 160 MG tablet Take 160 mg by mouth daily.    [provider]  folic acid (FOLVITE) 1 MG tablet Take 1 mg by mouth daily.    [provider]  glimepiride (AMARYL) 1 MG tablet Take 1 mg by mouth daily with breakfast.    [provider]  lisinopril (ZESTRIL) 5 MG tablet Take 5 mg by mouth daily.    [provider]  magnesium oxide  (MAG-OX) 400 MG tablet Take 400 mg by mouth daily.    [provider]  metFORMIN (GLUCOPHAGE) 500 MG tablet Take by mouth 2 (two) times daily with a meal.    [provider]  methotrexate (RHEUMATREX) 2.5 MG tablet Take 2.5 mg by mouth once a week. Caution:Chemotherapy. Protect from light.    [provider]  omeprazole (PRILOSEC OTC) 20 MG tablet Take 20 mg by mouth daily.    [provider]  simvastatin (ZOCOR) 40 MG tablet Take 40 mg by mouth daily.    [provider]  venlafaxine XR (EFFEXOR-XR) 37.5 MG 24 hr capsule Take 37.5 mg by mouth daily with breakfast.    [provider]  vitamin B-12 (CYANOCOBALAMIN) 1000 MCG tablet Take 1,000 mcg by mouth daily.    [provider]    Allergies    Patient has no allergy information on record.  Review of Systems   Review of Systems  Constitutional:  Negative for appetite change, chills and fever.  HENT:  Negative for ear pain, rhinorrhea, sneezing and sore throat.   Eyes:  Negative for photophobia and visual disturbance.  Respiratory:  Negative for cough, chest tightness, shortness of breath and wheezing.   Cardiovascular:  Positive for chest pain. Negative for palpitations.  Gastrointestinal:  Negative for abdominal pain, blood in stool, constipation, diarrhea, nausea  and vomiting.  Genitourinary:  Negative for dysuria, hematuria and urgency.  Musculoskeletal:  Positive for arthralgias. Negative for myalgias.  Skin:  Negative for rash.  Neurological:  Negative for dizziness, weakness and light-headedness.   Physical Exam Updated Vital Signs BP (!) 192/72   Pulse 61   Temp 97.7 F (36.5 C) (Oral)   Resp 13   SpO2 98%   Physical Exam Vitals and nursing note reviewed.  Constitutional:      General: He is not in acute distress.    Appearance: He is well-developed.  HENT:     Head: Normocephalic and atraumatic.     Nose: Nose normal.  Eyes:     General: No scleral  icterus.       Left eye: No discharge.     Conjunctiva/sclera: Conjunctivae normal.  Cardiovascular:     Rate and Rhythm: Normal rate and regular rhythm.     Heart sounds: Normal heart sounds. No murmur heard.   No friction rub. No gallop.  Pulmonary:     Effort: Pulmonary effort is normal. No respiratory distress.     Breath sounds: Normal breath sounds.  Abdominal:     General: Bowel sounds are normal. There is no distension.     Palpations: Abdomen is soft.     Tenderness: There is no abdominal tenderness. There is no guarding.  Musculoskeletal:        General: Normal range of motion.     Cervical back: Normal range of motion and neck supple.     Right lower leg: No edema.     Left lower leg: No edema.     Comments: No deformities noted of the left shoulder.  Normal range of motion.  No overlying skin changes.  2+ radial pulse palpated bilaterally.  Normal sensation to light touch of bilateral upper extremities.  Equal grip strength.  Skin:    General: Skin is warm and dry.     Findings: No rash.  Neurological:     Mental Status: He is alert.     Motor: No abnormal muscle tone.     Coordination: Coordination normal.    ED Results / Procedures / Treatments   Labs (all labs ordered are listed, but only abnormal results are displayed) Labs Reviewed  BASIC METABOLIC PANEL - Abnormal; Notable for the following components:      Result Value   Glucose, Bld 181 (*)    All other components within normal limits  CBC  TROPONIN I (HIGH SENSITIVITY)  TROPONIN I (HIGH SENSITIVITY)    EKG EKG Interpretation  Date/Time:  Wednesday January 06 2021 16:02:39 EDT Ventricular Rate:  76 PR Interval:  224 QRS Duration: 92 QT Interval:  370 QTC Calculation: 416 R Axis:   -13 Text Interpretation: Sinus rhythm with 1st degree A-V block Incomplete right bundle branch block Inferior infarct , age undetermined Possible Anterior infarct , age undetermined Abnormal ECG Confirmed by Madalyn Rob [phone]) on 01/06/2021 10:14:58 PM  Radiology DG Chest 2 View  Result Date: 01/06/2021 CLINICAL DATA:  Chest pain for 2 weeks. EXAM: CHEST - 2 VIEW COMPARISON:  None. FINDINGS: Normal heart size. No pleural effusion or edema. No airspace opacities identified. Visualized osseous structures are unremarkable. IMPRESSION: No acute cardiopulmonary abnormalities. Electronically Signed   By: Kerby Moors M.D.   On: 01/06/2021 16:47    Procedures Procedures   Medications Ordered in ED Medications  fentaNYL (SUBLIMAZE) injection 25 mcg (25 mcg Intravenous Given 01/06/21 2210)  lisinopril (ZESTRIL)  tablet 5 mg (5 mg Oral Given 01/06/21 2211)  methocarbamol (ROBAXIN) tablet 500 mg (500 mg Oral Given 01/06/21 2239)  acetaminophen (TYLENOL) tablet 650 mg (650 mg Oral Given 01/06/21 2239)    ED Course  I have reviewed the triage vital signs and the nursing notes.  Pertinent labs & imaging results that were available during my care of the patient were reviewed by me and considered in my medical decision making (see chart for details).    MDM Rules/Calculators/A&P                          75yo M with a past medical history of diabetes, hypertension presenting to the ED with a chief complaint of left shoulder pain.  Symptoms began about 2 weeks ago.  Noticed pain that is radiating down his left arm with paresthesias in his left hand.  He has seen an orthopedist for this last week, has been prescribed gabapentin and then prednisone and then hydrocodone but continues to have pain.  He is also had intermittent left-sided chest pain for the past 2 days.  No specific aggravating or alleviating factor.  No shortness of breath.  No neck pain, fever, injury or trauma.  On exam patient without any deformities of the shoulder.  No objective signs of numbness present noted.  No weakness noted.  Bilateral upper extremities are neurovascularly intact there are some tenderness of the chest wall on the left side.  He  is not hypoxic or tachycardic.  No lower extremity edema here.  He is hypertensive but I feel that this is due to not taking his antihypertensive tonight. Suspect that his symptoms are musculoskeletal in nature.  I feel that he will benefit from spine specialist follow-up as well as orthopedic follow-up.  In the meantime we will have him continue his medications.  His symptoms controlled here with muscle relaxer, fentanyl, Tylenol.  Doubt infectious or vascular cause of his symptoms.  Will give strict return precautions for any worsening symptoms   Patient is hemodynamically stable, in NAD, and able to ambulate in the ED. Evaluation does not show pathology that would require ongoing emergent intervention or inpatient treatment. I explained the diagnosis to the patient. Pain has been managed and has no complaints prior to discharge. Patient is comfortable with above plan and is stable for discharge at this time. All questions were answered prior to disposition. Strict return precautions for returning to the ED were discussed. Encouraged follow up with PCP.   An After Visit Summary was printed and given to the patient.   Portions of this note were generated with [employer]. Dictation errors may occur despite best attempts at proofreading.  Final Clinical Impression(s) / ED Diagnoses Final diagnoses:  Chest wall pain  Acute pain of left shoulder    Rx / DC Orders ED Discharge Orders     None        Delia Heady, PA-C 01/06/21 2323    Lucrezia Starch, MD 01/08/21 1136

## 2021-01-06 NOTE — Discharge Instructions (Addendum)
Call your orthopedist for follow up. Continue your medications as previously prescribed. Return to the ER if you start to experience worsening pain, trouble breathing, numbness or weakness

## 2021-01-06 NOTE — ED Triage Notes (Signed)
Patient complains of left shoulder pain radiating down left arm to 2 weeks. Has been taking gabapentin as prescribed and prednisone with no relief. Patient denies trauma. Tried hydrocodone with no relief.

## 2021-01-14 DIAGNOSIS — M542 Cervicalgia: Secondary | ICD-10-CM | POA: Diagnosis not present

## 2021-01-15 DIAGNOSIS — M5412 Radiculopathy, cervical region: Secondary | ICD-10-CM | POA: Diagnosis not present

## 2021-01-17 ENCOUNTER — Other Ambulatory Visit: Payer: Self-pay

## 2021-01-17 ENCOUNTER — Encounter (HOSPITAL_COMMUNITY)
Admission: EM | Disposition: A | Discharge status: Home / Self Care | Payer: Self-pay | Source: Home / Self Care | Attending: Thoracic Surgery (Cardiothoracic Vascular Surgery)

## 2021-01-17 ENCOUNTER — Emergency Department (HOSPITAL_COMMUNITY): Payer: Medicare HMO

## 2021-01-17 ENCOUNTER — Inpatient Hospital Stay (HOSPITAL_COMMUNITY)
Admission: EM | Admit: 2021-01-17 | Discharge: 2021-01-23 | DRG: 234 | Disposition: A | Discharge status: Home / Self Care | Payer: Medicare HMO | Attending: Thoracic Surgery (Cardiothoracic Vascular Surgery) | Admitting: Thoracic Surgery (Cardiothoracic Vascular Surgery)

## 2021-01-17 ENCOUNTER — Encounter (HOSPITAL_COMMUNITY): Payer: Self-pay | Admitting: Interventional Cardiology

## 2021-01-17 DIAGNOSIS — G8929 Other chronic pain: Secondary | ICD-10-CM | POA: Diagnosis present

## 2021-01-17 DIAGNOSIS — R739 Hyperglycemia, unspecified: Secondary | ICD-10-CM | POA: Diagnosis not present

## 2021-01-17 DIAGNOSIS — Z955 Presence of coronary angioplasty implant and graft: Secondary | ICD-10-CM | POA: Diagnosis not present

## 2021-01-17 DIAGNOSIS — Z79899 Other long term (current) drug therapy: Secondary | ICD-10-CM | POA: Diagnosis not present

## 2021-01-17 DIAGNOSIS — D62 Acute posthemorrhagic anemia: Secondary | ICD-10-CM | POA: Diagnosis not present

## 2021-01-17 DIAGNOSIS — Z7984 Long term (current) use of oral hypoglycemic drugs: Secondary | ICD-10-CM

## 2021-01-17 DIAGNOSIS — J90 Pleural effusion, not elsewhere classified: Secondary | ICD-10-CM | POA: Diagnosis not present

## 2021-01-17 DIAGNOSIS — Z7982 Long term (current) use of aspirin: Secondary | ICD-10-CM | POA: Diagnosis not present

## 2021-01-17 DIAGNOSIS — I517 Cardiomegaly: Secondary | ICD-10-CM | POA: Diagnosis not present

## 2021-01-17 DIAGNOSIS — M79602 Pain in left arm: Secondary | ICD-10-CM | POA: Diagnosis present

## 2021-01-17 DIAGNOSIS — I248 Other forms of acute ischemic heart disease: Secondary | ICD-10-CM | POA: Diagnosis not present

## 2021-01-17 DIAGNOSIS — E785 Hyperlipidemia, unspecified: Secondary | ICD-10-CM | POA: Diagnosis present

## 2021-01-17 DIAGNOSIS — Z01810 Encounter for preprocedural cardiovascular examination: Secondary | ICD-10-CM | POA: Diagnosis not present

## 2021-01-17 DIAGNOSIS — I2510 Atherosclerotic heart disease of native coronary artery without angina pectoris: Secondary | ICD-10-CM | POA: Diagnosis not present

## 2021-01-17 DIAGNOSIS — Z4682 Encounter for fitting and adjustment of non-vascular catheter: Secondary | ICD-10-CM | POA: Diagnosis not present

## 2021-01-17 DIAGNOSIS — E118 Type 2 diabetes mellitus with unspecified complications: Secondary | ICD-10-CM | POA: Diagnosis not present

## 2021-01-17 DIAGNOSIS — I2111 ST elevation (STEMI) myocardial infarction involving right coronary artery: Secondary | ICD-10-CM | POA: Diagnosis not present

## 2021-01-17 DIAGNOSIS — R079 Chest pain, unspecified: Secondary | ICD-10-CM

## 2021-01-17 DIAGNOSIS — I340 Nonrheumatic mitral (valve) insufficiency: Secondary | ICD-10-CM | POA: Diagnosis not present

## 2021-01-17 DIAGNOSIS — R0789 Other chest pain: Secondary | ICD-10-CM | POA: Diagnosis not present

## 2021-01-17 DIAGNOSIS — E119 Type 2 diabetes mellitus without complications: Secondary | ICD-10-CM | POA: Diagnosis not present

## 2021-01-17 DIAGNOSIS — I213 ST elevation (STEMI) myocardial infarction of unspecified site: Principal | ICD-10-CM

## 2021-01-17 DIAGNOSIS — Z20822 Contact with and (suspected) exposure to covid-19: Secondary | ICD-10-CM | POA: Diagnosis present

## 2021-01-17 DIAGNOSIS — Z951 Presence of aortocoronary bypass graft: Secondary | ICD-10-CM

## 2021-01-17 DIAGNOSIS — I10 Essential (primary) hypertension: Secondary | ICD-10-CM | POA: Diagnosis present

## 2021-01-17 DIAGNOSIS — R918 Other nonspecific abnormal finding of lung field: Secondary | ICD-10-CM | POA: Diagnosis not present

## 2021-01-17 DIAGNOSIS — M542 Cervicalgia: Secondary | ICD-10-CM | POA: Diagnosis present

## 2021-01-17 DIAGNOSIS — F0390 Unspecified dementia without behavioral disturbance: Secondary | ICD-10-CM | POA: Diagnosis not present

## 2021-01-17 DIAGNOSIS — K5900 Constipation, unspecified: Secondary | ICD-10-CM | POA: Diagnosis not present

## 2021-01-17 DIAGNOSIS — Z9889 Other specified postprocedural states: Secondary | ICD-10-CM | POA: Diagnosis not present

## 2021-01-17 DIAGNOSIS — J939 Pneumothorax, unspecified: Secondary | ICD-10-CM

## 2021-01-17 DIAGNOSIS — J9811 Atelectasis: Secondary | ICD-10-CM | POA: Diagnosis not present

## 2021-01-17 DIAGNOSIS — I441 Atrioventricular block, second degree: Secondary | ICD-10-CM | POA: Diagnosis present

## 2021-01-17 DIAGNOSIS — Z09 Encounter for follow-up examination after completed treatment for conditions other than malignant neoplasm: Secondary | ICD-10-CM

## 2021-01-17 DIAGNOSIS — Z794 Long term (current) use of insulin: Secondary | ICD-10-CM | POA: Diagnosis not present

## 2021-01-17 DIAGNOSIS — I4430 Unspecified atrioventricular block: Secondary | ICD-10-CM | POA: Diagnosis not present

## 2021-01-17 DIAGNOSIS — I499 Cardiac arrhythmia, unspecified: Secondary | ICD-10-CM

## 2021-01-17 DIAGNOSIS — I25110 Atherosclerotic heart disease of native coronary artery with unstable angina pectoris: Secondary | ICD-10-CM | POA: Diagnosis not present

## 2021-01-17 DIAGNOSIS — R0602 Shortness of breath: Secondary | ICD-10-CM | POA: Diagnosis not present

## 2021-01-17 DIAGNOSIS — I219 Acute myocardial infarction, unspecified: Secondary | ICD-10-CM

## 2021-01-17 HISTORY — DX: Chest pain, unspecified: R07.9

## 2021-01-17 HISTORY — DX: Hyperlipidemia, unspecified: E78.5

## 2021-01-17 HISTORY — PX: LEFT HEART CATH AND CORONARY ANGIOGRAPHY: CATH118249

## 2021-01-17 HISTORY — DX: Essential (primary) hypertension: I10

## 2021-01-17 HISTORY — DX: Type 2 diabetes mellitus without complications: E11.9

## 2021-01-17 HISTORY — DX: Cardiac arrhythmia, unspecified: I49.9

## 2021-01-17 LAB — CBC
HCT: 42.7 % (ref 39.0–52.0)
HCT: 39.7 % (ref 39.0–52.0)
Hemoglobin: 14.6 g/dL (ref 13.0–17.0)
Hemoglobin: 13.5 g/dL (ref 13.0–17.0)
MCH: 30.5 pg (ref 26.0–34.0)
MCH: 30.4 pg (ref 26.0–34.0)
MCHC: 34.2 g/dL (ref 30.0–36.0)
MCHC: 34 g/dL (ref 30.0–36.0)
MCV: 89.1 fL (ref 80.0–100.0)
MCV: 89.4 fL (ref 80.0–100.0)
Platelets: 230 10*3/uL (ref 150–400)
Platelets: 214 10*3/uL (ref 150–400)
RBC: 4.79 MIL/uL (ref 4.22–5.81)
RBC: 4.44 MIL/uL (ref 4.22–5.81)
RDW: 12.7 % (ref 11.5–15.5)
RDW: 12.6 % (ref 11.5–15.5)
WBC: 7.9 10*3/uL (ref 4.0–10.5)
WBC: 6.1 10*3/uL (ref 4.0–10.5)
nRBC: 0 % (ref 0.0–0.2)
nRBC: 0 % (ref 0.0–0.2)

## 2021-01-17 LAB — BASIC METABOLIC PANEL
Anion gap: 8 (ref 5–15)
BUN: 15 mg/dL (ref 8–23)
CO2: 23 mmol/L (ref 22–32)
Calcium: 9.3 mg/dL (ref 8.9–10.3)
Chloride: 101 mmol/L (ref 98–111)
Creatinine, Ser: 1.28 mg/dL — ABNORMAL HIGH (ref 0.61–1.24)
GFR, Estimated: 58 mL/min — ABNORMAL LOW (ref >60)
Glucose, Bld: 343 mg/dL — ABNORMAL HIGH (ref 70–99)
Potassium: 4.7 mmol/L (ref 3.5–5.1)
Sodium: 132 mmol/L — ABNORMAL LOW (ref 135–145)

## 2021-01-17 LAB — TROPONIN I (HIGH SENSITIVITY)
Troponin I (High Sensitivity): 394 ng/L (ref <18)
Troponin I (High Sensitivity): 381 ng/L (ref <18)

## 2021-01-17 LAB — GLUCOSE, CAPILLARY: Glucose-Capillary: 236 mg/dL — ABNORMAL HIGH (ref 70–99)

## 2021-01-17 LAB — SARS CORONAVIRUS 2 (TAT 6-24 HRS): SARS Coronavirus 2: NEGATIVE

## 2021-01-17 SURGERY — LEFT HEART CATH AND CORONARY ANGIOGRAPHY
Anesthesia: LOCAL

## 2021-01-17 MED ORDER — SODIUM CHLORIDE 0.9 % IV SOLN: 250.0000 mL | INTRAVENOUS | Status: DC | PRN

## 2021-01-17 MED ORDER — LABETALOL HCL 5 MG/ML IV SOLN: 10.0000 mg | INTRAVENOUS | Status: DC | PRN

## 2021-01-17 MED ORDER — IOHEXOL 350 MG/ML SOLN
INTRAVENOUS | Status: DC | PRN
  Administered 2021-01-17: 75 mL

## 2021-01-17 MED ORDER — OMEPRAZOLE 20 MG PO CPDR
20.0000 mg | DELAYED_RELEASE_CAPSULE | Freq: Every day | ORAL | Status: DC
  Administered 2021-01-18: 20 mg via ORAL
  Filled 2021-01-17 (×2): qty 1

## 2021-01-17 MED ORDER — LIDOCAINE HCL (PF) 1 % IJ SOLN
INTRAMUSCULAR | Status: AC
  Filled 2021-01-17: qty 30

## 2021-01-17 MED ORDER — HYDRALAZINE HCL 20 MG/ML IJ SOLN: 10.0000 mg | INTRAMUSCULAR | Status: AC | PRN

## 2021-01-17 MED ORDER — NITROGLYCERIN 0.4 MG SL SUBL
0.4000 mg | SUBLINGUAL_TABLET | SUBLINGUAL | Status: DC | PRN
Start: 2021-01-17 — End: 2021-01-19

## 2021-01-17 MED ORDER — VENLAFAXINE HCL ER 37.5 MG PO CP24
37.5000 mg | ORAL_CAPSULE | Freq: Every day | ORAL | Status: DC
  Administered 2021-01-18 – 2021-01-23 (×5): 37.5 mg via ORAL
  Filled 2021-01-17 (×6): qty 1

## 2021-01-17 MED ORDER — SODIUM CHLORIDE 0.9 % IV SOLN: INTRAVENOUS | Status: AC

## 2021-01-17 MED ORDER — FENOFIBRATE 160 MG PO TABS
160.0000 mg | ORAL_TABLET | Freq: Every day | ORAL | Status: DC
  Administered 2021-01-18: 160 mg via ORAL
  Filled 2021-01-17: qty 1

## 2021-01-17 MED ORDER — ACETAMINOPHEN 325 MG PO TABS: 650.0000 mg | ORAL_TABLET | ORAL | Status: DC | PRN

## 2021-01-17 MED ORDER — NITROGLYCERIN IN D5W 200-5 MCG/ML-% IV SOLN
5.0000 ug/min | INTRAVENOUS | Status: DC
  Administered 2021-01-17: 5 ug/min via INTRAVENOUS
  Administered 2021-01-19: 30 ug/min via INTRAVENOUS
  Filled 2021-01-17: qty 250

## 2021-01-17 MED ORDER — ACETAMINOPHEN 325 MG PO TABS
650.0000 mg | ORAL_TABLET | ORAL | Status: DC | PRN
Start: 2021-01-17 — End: 2021-01-19
  Administered 2021-01-18 (×2): 650 mg via ORAL
  Filled 2021-01-17 (×2): qty 2

## 2021-01-17 MED ORDER — HEPARIN (PORCINE) IN NACL 1000-0.9 UT/500ML-% IV SOLN
INTRAVENOUS | Status: DC | PRN
  Administered 2021-01-17 (×2): 500 mL

## 2021-01-17 MED ORDER — ASPIRIN 325 MG PO TABS
325.0000 mg | ORAL_TABLET | Freq: Every day | ORAL | Status: DC
  Administered 2021-01-17: 325 mg via ORAL

## 2021-01-17 MED ORDER — VERAPAMIL HCL 2.5 MG/ML IV SOLN
INTRAVENOUS | Status: AC
  Filled 2021-01-17: qty 2

## 2021-01-17 MED ORDER — LIDOCAINE HCL (PF) 1 % IJ SOLN
INTRAMUSCULAR | Status: DC | PRN
  Administered 2021-01-17: 2 mL
  Administered 2021-01-17: 10 mL

## 2021-01-17 MED ORDER — SODIUM CHLORIDE 0.9% FLUSH: 3.0000 mL | INTRAVENOUS | Status: DC | PRN

## 2021-01-17 MED ORDER — HEPARIN (PORCINE) IN NACL 1000-0.9 UT/500ML-% IV SOLN
INTRAVENOUS | Status: AC
  Filled 2021-01-17: qty 1000

## 2021-01-17 MED ORDER — MAGNESIUM OXIDE -MG SUPPLEMENT 400 (240 MG) MG PO TABS
400.0000 mg | ORAL_TABLET | Freq: Every day | ORAL | Status: DC
  Administered 2021-01-18: 400 mg via ORAL
  Filled 2021-01-17: qty 1

## 2021-01-17 MED ORDER — HEPARIN SODIUM (PORCINE) 5000 UNIT/ML IJ SOLN
4000.0000 [IU] | Freq: Once | INTRAMUSCULAR | Status: AC
  Administered 2021-01-17: 4000 [IU] via INTRAVENOUS

## 2021-01-17 MED ORDER — ATORVASTATIN CALCIUM 80 MG PO TABS
80.0000 mg | ORAL_TABLET | Freq: Every day | ORAL | Status: DC
  Administered 2021-01-18 – 2021-01-23 (×5): 80 mg via ORAL
  Filled 2021-01-17 (×5): qty 1

## 2021-01-17 MED ORDER — ASPIRIN EC 325 MG PO TBEC
325.0000 mg | DELAYED_RELEASE_TABLET | Freq: Every day | ORAL | Status: DC
  Administered 2021-01-18: 325 mg via ORAL
  Filled 2021-01-17: qty 1

## 2021-01-17 MED ORDER — ASPIRIN EC 81 MG PO TBEC: 81.0000 mg | DELAYED_RELEASE_TABLET | Freq: Every day | ORAL | Status: DC

## 2021-01-17 MED ORDER — SODIUM CHLORIDE 0.9% FLUSH
3.0000 mL | Freq: Two times a day (BID) | INTRAVENOUS | Status: DC
  Administered 2021-01-18: 3 mL via INTRAVENOUS

## 2021-01-17 MED ORDER — ONDANSETRON HCL 4 MG/2ML IJ SOLN
4.0000 mg | Freq: Four times a day (QID) | INTRAMUSCULAR | Status: DC | PRN
  Administered 2021-01-18: 4 mg via INTRAVENOUS
  Filled 2021-01-17: qty 2

## 2021-01-17 MED ORDER — DONEPEZIL HCL 5 MG PO TABS
5.0000 mg | ORAL_TABLET | Freq: Every day | ORAL | Status: DC
  Administered 2021-01-17 – 2021-01-22 (×6): 5 mg via ORAL
  Filled 2021-01-17 (×7): qty 1

## 2021-01-17 MED ORDER — ONDANSETRON HCL 4 MG/2ML IJ SOLN: 4.0000 mg | Freq: Four times a day (QID) | INTRAMUSCULAR | Status: DC | PRN

## 2021-01-17 MED ORDER — VERAPAMIL HCL 2.5 MG/ML IV SOLN
INTRAVENOUS | Status: DC | PRN
  Administered 2021-01-17: 10 mL via INTRA_ARTERIAL

## 2021-01-17 MED ORDER — HEPARIN (PORCINE) 25000 UT/250ML-% IV SOLN
1350.0000 [IU]/h | INTRAVENOUS | Status: DC
  Administered 2021-01-18: 1100 [IU]/h via INTRAVENOUS
  Administered 2021-01-19: 1350 [IU]/h via INTRAVENOUS
  Filled 2021-01-17 (×2): qty 250

## 2021-01-17 MED ORDER — SODIUM CHLORIDE 0.9 % IV SOLN
INTRAVENOUS | Status: AC | PRN
  Administered 2021-01-17: 89 mL/h via INTRAVENOUS

## 2021-01-17 MED ORDER — LISINOPRIL 5 MG PO TABS
5.0000 mg | ORAL_TABLET | Freq: Every day | ORAL | Status: DC
  Administered 2021-01-18: 5 mg via ORAL
  Filled 2021-01-17: qty 1

## 2021-01-17 SURGICAL SUPPLY — 15 items
CATH 5FR JL3.5 JR4 ANG PIG MP (CATHETERS) ×2 IMPLANT
CATH INFINITI 5FR JL4 (CATHETERS) ×2 IMPLANT
CLOSURE MYNX CONTROL 6F/7F (Vascular Products) ×2 IMPLANT
DEVICE RAD COMP TR BAND LRG (VASCULAR PRODUCTS) ×2 IMPLANT
GLIDESHEATH SLEND A-KIT 6F 22G (SHEATH) ×2 IMPLANT
GUIDEWIRE INQWIRE 1.5J.035X260 (WIRE) ×1 IMPLANT
INQWIRE 1.5J .035X260CM (WIRE) ×2
KIT HEART LEFT (KITS) ×2 IMPLANT
PACK CARDIAC CATHETERIZATION (CUSTOM PROCEDURE TRAY) ×2 IMPLANT
SHEATH PINNACLE 6F 10CM (SHEATH) ×2 IMPLANT
SHEATH PROBE COVER 6X72 (BAG) ×2 IMPLANT
TRANSDUCER W/STOPCOCK (MISCELLANEOUS) ×2 IMPLANT
TUBING CIL FLEX 10 FLL-RA (TUBING) ×2 IMPLANT
WIRE EMERALD 3MM-J .035X150CM (WIRE) ×2 IMPLANT
WIRE HI TORQ VERSACORE-J 145CM (WIRE) ×2 IMPLANT

## 2021-01-17 NOTE — H&P (Signed)
Interventional Cardiology Critical Note  The patient has been seen in conjunction with Billey Chang, MD. All aspects of care have been considered and discussed. The patient has been personally interviewed, examined, and all clinical data has been reviewed.  The patient was brought to the emergency room by EMS after having significant chest discomfort that started at 8:30 AM.  He arrived in the emergency room at around 6 PM after his son encouraged him to come to the ER.  His wife has been encouraging him since early in the morning but he refused to come.  The story was somewhat confusing when taken from the patient.  He did mention severe chest discomfort at 8:30 AM.  He also spoke of left shoulder and arm pain that would be aggravated by moving the arm.  He had an emergency room visit 9 days ago for similar symptoms.  According to the emergency room notes a single nitroglycerin tablet relieved his chest discomfort.  Upon direct questioning of the patient in the emergency room, at times he was he had no discomfort and other times he would say the chest pain was present. New on the patient's EKG is Mobitz 1 second-degree heart block.  He has inferior Q waves on the EKG which not significantly different than 9 days ago.  EKG changes present are not diagnostic for STEMI. The patient seem to have some difficulty with memory.  He has some difficulty with maintaining focus doing questioning.  He does not have any focal neurological deficits.  Vital signs include a normal blood pressure 136/70 and heart rate varying between 48 and 70 bpm.  Monitor demonstrates intermittent Mobitz 1 second-degree heart block.  No carotid bruits are heard.  Cardiac exam is normal with exception of heart rate variability.  Lungs are clear.  Radial and posterior tibial pulses are 2+ and symmetric bilaterally. IMPRESSION: Relatively late presenting probable inferior infarction although was difficult to tell based upon clinical  findings.  I am suspicious that Mobitz 1 second-degree heart block could be a function of inferior wall ischemia/injury.  After discussing with the patient and wife, I felt that urgent cardiac catheterization was indicated to determine the status of his coronary anatomy. PLAN: Urgent catheterization to define anatomy and help guide therapy.  This was discussed with the patient's wife and the patient.  They are in agreement with moving forward.  CRITICAL CARE TIME: 40 minutes

## 2021-01-17 NOTE — Progress Notes (Signed)
ANTICOAGULATION CONSULT NOTE - Initial Consult  Pharmacy Consult for Heparin Indication: 3vCAD, awaiting CVTS consult  No Known Allergies  Patient Measurements: Height: '6\' 3"'$  (190.5 cm) Weight: 88.9 kg (196 lb) IBW/kg (Calculated) : 84.5 Heparin Dosing Weight: 88 kg  Vital Signs: Temp: 97.5 F (36.4 C) (07/24 1751) Temp Source: Oral (07/24 1751) BP: 139/72 (07/24 1954) Pulse Rate: 89 (07/24 2004)  Labs: Recent Labs    01/17/21 1801  HGB 14.6  HCT 42.7  PLT 230  CREATININE 1.28*  TROPONINIHS 394*    Estimated Creatinine Clearance: 59.6 mL/min (A) (by C-G formula based on SCr of 1.28 mg/dL (H)).   Medical History: No past medical history on file.  Medications:  Infusions:   Assessment: 15 YOM who presented on 7/24 with CP/STEMI with emergent cath revealing 3vCAD. Pharmacy consulted to resume Heparin 8 hours after sheath removal.  Sheaths removed around ~2000. No AC PTA, Hep wt 88 kg  Goal of Therapy:  Heparin level 0.3-0.7 units/ml Monitor platelets by anticoagulation protocol: Yes   Plan:  - No bolus - Start Heparin at a rate of 1100 units/hr (11 ml/hr) - Will continue to monitor for any signs/symptoms of bleeding and will follow up with heparin level in 8 hours after starting  Thank you for allowing pharmacy to be a part of this patient's care.  Alycia Rossetti, PharmD, BCPS Clinical Pharmacist Clinical phone for 01/17/2021: [phone] 01/17/2021 8:13 PM   **Pharmacist phone directory can now be found on [email or web address] ([email or web address]).  Listed under [facility].

## 2021-01-17 NOTE — ED Notes (Signed)
Increase pain in left arm

## 2021-01-17 NOTE — H&P (Signed)
Cardiology Admission History and Physical:   Patient ID: Shane Ford MRN: [id number]; DOB: 31-Jan-1946   Admission date: 01/17/2021  PCP:  Rusty Aus, MD   [facility] Providers Cardiologist:  None        Chief Complaint:  chest pain / neck and arm pain  Patient Profile:   Shane Ford is a 75 y.o. male with HTN, HLD, T2DM, who is being seen 01/17/2021 for the evaluation of chest pain.  History of Present Illness:   Shane Ford is a 75 yo male with htn, HLD, t2dm who began having chest pain, neck pain, and left arm pain several days ago. This am, the pain was progressive with severe substernal chest pressure and diaphoresis. Came to the ED 9 days ago for similar symptoms however workup was unremarkable at the time. Has known CAD with a prior stent in 1999 to the mid RCA.    Past Medical History:  Diagnosis Date   Diabetes mellitus ([place])    Hyperlipidemia    Hypertension     History reviewed. No pertinent surgical history.   Medications Prior to Admission: Prior to Admission medications   Medication Sig Start Date End Date Taking? Authorizing Provider  aspirin EC 81 MG tablet Take 81 mg by mouth daily. Swallow whole.    [provider]  donepezil (ARICEPT) 5 MG tablet Take 5 mg by mouth at bedtime.    [provider]  fenofibrate 160 MG tablet Take 160 mg by mouth daily.    [provider]  folic acid (FOLVITE) 1 MG tablet Take 1 mg by mouth daily.    [provider]  glimepiride (AMARYL) 1 MG tablet Take 1 mg by mouth daily with breakfast.    [provider]  lisinopril (ZESTRIL) 5 MG tablet Take 5 mg by mouth daily.    [provider]  magnesium oxide (MAG-OX) 400 MG tablet Take 400 mg by mouth daily.    [provider]  metFORMIN (GLUCOPHAGE) 500 MG tablet Take by mouth 2 (two) times daily with a meal.    [provider]  methotrexate (RHEUMATREX) 2.5 MG tablet Take 2.5 mg by mouth  once a week. Caution:Chemotherapy. Protect from light.    [provider]  omeprazole (PRILOSEC OTC) 20 MG tablet Take 20 mg by mouth daily.    [provider]  simvastatin (ZOCOR) 40 MG tablet Take 40 mg by mouth daily.    [provider]  venlafaxine XR (EFFEXOR-XR) 37.5 MG 24 hr capsule Take 37.5 mg by mouth daily with breakfast.    [provider]  vitamin B-12 (CYANOCOBALAMIN) 1000 MCG tablet Take 1,000 mcg by mouth daily.    [provider]     Allergies:   No Known Allergies  Social History:   Social History   Socioeconomic History   Marital status: Married    Spouse name: Not on file   Number of children: Not on file   Years of education: Not on file   Highest education level: Not on file  Occupational History   Not on file  Tobacco Use   Smoking status: Unknown   Smokeless tobacco: Not on file  Substance and Sexual Activity   Alcohol use: Not on file   Drug use: Not on file   Sexual activity: Not on file  Other Topics Concern   Not on file  Social History Narrative   Not on file   Social Determinants of Health  Financial Resource Strain: Not on file  Food Insecurity: Not on file  Transportation Needs: Not on file  Physical Activity: Not on file  Stress: Not on file  Social Connections: Not on file  Intimate Partner Violence: Not on file    Family History:  noncontributory The patient's family history is not on file.    ROS:  Please see the history of present illness.  All other ROS reviewed and negative.     Physical Exam/Data:   Vitals:   01/17/21 1954 01/17/21 1959 01/17/21 2004 01/17/21 2017  BP: 139/72     Pulse: 63 67 89   Resp: 15 17 (!) 23   Temp:    97.9 F (36.6 C)  TempSrc:    Oral  SpO2: 96% 98% (!) 0%   Weight:      Height:        Intake/Output Summary (Last 24 hours) at 01/17/2021 2035 Last data filed at 01/17/2021 2000 Gross per 24 hour  Intake --  Output 150 ml  Net -150 ml    Last 3 Weights 01/17/2021  Weight (lbs) 196 lb  Weight (kg) 88.905 kg     Body mass index is 24.5 kg/m.  General:  Well nourished, well developed, in no acute distress HEENT: normal Lymph: no adenopathy Neck: no JVD Endocrine:  No thryomegaly Vascular: No carotid bruits; FA pulses 2+ bilaterally without bruits  Cardiac:  normal S1, S2; RRR; no murmur  Lungs:  clear to auscultation bilaterally, no wheezing, rhonchi or rales  Abd: soft, nontender, no hepatomegaly  Ext: no edema Musculoskeletal:  No deformities, BUE and BLE strength normal and equal Skin: warm and dry  Neuro:  CNs 2-12 intact, no focal abnormalities noted Psych:  Normal affect    EKG:  The ECG that was done in the emrgency department was personally reviewed and demonstrates inferior q waves with J point elevation in II,III, aVF  Relevant CV Studies: none  Laboratory Data:  High Sensitivity Troponin:   Recent Labs  Lab 01/06/21 1601 01/06/21 1801 01/17/21 1801  TROPONINIHS 11 13 394*      Chemistry Recent Labs  Lab 01/17/21 1801  NA 132*  K 4.7  CL 101  CO2 23  GLUCOSE 343*  BUN 15  CREATININE 1.28*  CALCIUM 9.3  GFRNONAA 58*  ANIONGAP 8    No results for input(s): PROT, ALBUMIN, AST, ALT, ALKPHOS, BILITOT in the last 168 hours. Hematology Recent Labs  Lab 01/17/21 1801  WBC 7.9  RBC 4.79  HGB 14.6  HCT 42.7  MCV 89.1  MCH 30.5  MCHC 34.2  RDW 12.7  PLT 230   BNPNo results for input(s): BNP, PROBNP in the last 168 hours.  DDimer No results for input(s): DDIMER in the last 168 hours.   Radiology/Studies:  CARDIAC CATHETERIZATION  Result Date: 01/17/2021 80% distal left main Ostial to proximal 85% LAD, 80% first diagonal, 80% second diagonal, and apical LAD 75%. 99% proximal circumflex.  Distal circumflex fills faintly by right to left collaterals. Dominant right coronary with patent distal RCA stent, 90% ostial PDA, total occlusion of the first LV branch, and 95% stenosis of the  second LV branch. Overall normal LV function with EF 55%.  Focal mid inferior wall akinesis.  LVEDP 8 mmHg. RECOMMENDATIONS: Anatomy suggest that surgical revascularization would be the best treatment for this patient assuming he has no contraindications to surgery.  He is relatively stable at this time.  Plan to consult T CTS in a.m.  IV heparin will be resumed in 6 to 8 hours IV nitroglycerin will be started With relatively low LV filling pressures, IV saline is started   [facility] Chest Port 1 View  Result Date: 01/17/2021 CLINICAL DATA:  Chest pain. EXAM: PORTABLE CHEST 1 VIEW COMPARISON:  01/06/2021. FINDINGS: Cardiac silhouette is normal in size. No mediastinal or hilar masses. Prominent markings noted in the lung bases, stable. Lungs otherwise clear. No pleural effusion or pneumothorax. Skeletal structures are grossly intact. IMPRESSION: No active disease. Electronically Signed   By: Lajean Manes M.D.   On: 01/17/2021 18:32     Assessment and Plan:   Severe 3vCAD, STEMI. Given abnormal ECG findings and an ongoing chest pain, with significant risk factors putting him at higher risk. So decision made to take to cath lab. Found to have severe 3vCAD involving LM as well. Will consult CT surgery tomorrow for evaluation.  Continue asa/statin, heparin for anticoagulation No BB due to mobitz type 1 bradycardia Echo ordered - function looked okay on Vgram in cath, but will get formal echo to assess Lipids, A1c Continue lisinopril for now Nitro PRN for pain  T2DM - hold orals for now. Sliding scale insulin ac/hs checks   Risk Assessment/Risk Scores:    TIMI Risk Score for ST  Elevation MI:   The patient's TIMI risk score is 4, which indicates a 7.3% risk of all cause mortality at 30 days.        Severity of Illness: The appropriate patient status for this patient is OBSERVATION. Observation status is judged to be reasonable and necessary in order to provide the required intensity of service to  ensure the patient's safety. The patient's presenting symptoms, physical exam findings, and initial radiographic and laboratory data in the context of their medical condition is felt to place them at decreased risk for further clinical deterioration. Furthermore, it is anticipated that the patient will be medically stable for discharge from the hospital within 2 midnights of admission. The following factors support the patient status of observation.   " The patient's presenting symptoms include chest pressure. " The physical exam findings include diaphoresis, chest pain. " The initial radiographic and laboratory data are elevated troponin, abnormal ECG .   For questions or updates, please contact [place] Please consult [email or web address] for contact info under     Signed, Doyne Keel, MD  01/17/2021 8:35 PM

## 2021-01-17 NOTE — ED Notes (Signed)
Cards attending at bedside

## 2021-01-17 NOTE — ED Provider Notes (Signed)
[facility] EMERGENCY DEPARTMENT Provider Note   CSN: [id number] Arrival date & time: 01/17/21  1742     History Chief Complaint  Patient presents with   Chest Pain    Shane Ford is a 75 y.o. male.  HPI Patient is a 75 year old male with a history of hypertension, diabetes mellitus, type I AV block, who presents to the emergency department due to left-sided chest pain that started around 8:30 AM this morning.  Patient states he was sitting and eating breakfast when his pain started.  Rates it as 2/10 and describes it as a squeezing pain.  Also reports left shoulder pain that radiates down the left arm and states this pain is chronic and is due to chronic neck pain.  Early this afternoon he was still experiencing a squeezing pain and went to have a bowel movement.  He has been constipated for the past 3 days.  He was straining more than normal and became short of breath and diaphoretic.  His pain, shortness of breath, diaphoresis persisted this afternoon and he called EMS.  He was given a single dose of NTG and denies any current pain or shortness of breath.  Does report some intermittent nausea today but no vomiting.    No past medical history on file.  There are no problems to display for this patient.   No past surgical history on file.     No family history on file.  Social History   Tobacco Use   Smoking status: Unknown    Home Medications Prior to Admission medications   Medication Sig Start Date End Date Taking? Authorizing Provider  aspirin EC 81 MG tablet Take 81 mg by mouth daily. Swallow whole.    [provider]  donepezil (ARICEPT) 5 MG tablet Take 5 mg by mouth at bedtime.    [provider]  fenofibrate 160 MG tablet Take 160 mg by mouth daily.    [provider]  folic acid (FOLVITE) 1 MG tablet Take 1 mg by mouth daily.    [provider]  glimepiride (AMARYL) 1 MG tablet Take 1 mg by mouth daily  with breakfast.    [provider]  lisinopril (ZESTRIL) 5 MG tablet Take 5 mg by mouth daily.    [provider]  magnesium oxide (MAG-OX) 400 MG tablet Take 400 mg by mouth daily.    [provider]  metFORMIN (GLUCOPHAGE) 500 MG tablet Take by mouth 2 (two) times daily with a meal.    [provider]  methotrexate (RHEUMATREX) 2.5 MG tablet Take 2.5 mg by mouth once a week. Caution:Chemotherapy. Protect from light.    [provider]  omeprazole (PRILOSEC OTC) 20 MG tablet Take 20 mg by mouth daily.    [provider]  simvastatin (ZOCOR) 40 MG tablet Take 40 mg by mouth daily.    [provider]  venlafaxine XR (EFFEXOR-XR) 37.5 MG 24 hr capsule Take 37.5 mg by mouth daily with breakfast.    [provider]  vitamin B-12 (CYANOCOBALAMIN) 1000 MCG tablet Take 1,000 mcg by mouth daily.    [provider]    Allergies    Patient has no known allergies.  Review of Systems   Review of Systems  All other systems reviewed and are negative. Ten systems reviewed and are negative for acute change, except as noted in the HPI.   Physical Exam Updated Vital Signs BP 126/69   Pulse 80  Temp (!) 97.5 F (36.4 C) (Oral)   Resp 14   Ht '6\' 3"'$  (1.905 m)   Wt 88.9 kg   SpO2 98%   BMI 24.50 kg/m   Physical Exam Vitals and nursing note reviewed.  Constitutional:      General: He is not in acute distress.    Appearance: Normal appearance. He is not ill-appearing, toxic-appearing or diaphoretic.  HENT:     Head: Normocephalic and atraumatic.     Right Ear: External ear normal.     Left Ear: External ear normal.     Nose: Nose normal.     Mouth/Throat:     Mouth: Mucous membranes are moist.     Pharynx: Oropharynx is clear. No oropharyngeal exudate or posterior oropharyngeal erythema.  Eyes:     Extraocular Movements: Extraocular movements intact.  Cardiovascular:     Rate and Rhythm: Regular rhythm.  Bradycardia present.     Pulses: Normal pulses.          Radial pulses are 2+ on the right side and 2+ on the left side.       Dorsalis pedis pulses are 2+ on the right side and 2+ on the left side.     Heart sounds: Normal heart sounds. No murmur heard.   No friction rub. No gallop.  Pulmonary:     Effort: Pulmonary effort is normal. No tachypnea, accessory muscle usage or respiratory distress.     Breath sounds: Normal breath sounds. No stridor. No decreased breath sounds, wheezing, rhonchi or rales.  Abdominal:     General: Abdomen is flat.     Palpations: Abdomen is soft.     Tenderness: There is no abdominal tenderness.     Comments: Protuberant abdomen that is soft and nontender.  Musculoskeletal:        General: Normal range of motion.     Cervical back: Normal range of motion and neck supple. No tenderness.     Right lower leg: No tenderness. No edema.     Left lower leg: No tenderness. No edema.     Comments: No pedal edema.  Skin:    General: Skin is warm and dry.  Neurological:     General: No focal deficit present.     Mental Status: He is alert and oriented to person, place, and time.  Psychiatric:        Mood and Affect: Mood normal.        Behavior: Behavior normal.   ED Results / Procedures / Treatments   Labs (all labs ordered are listed, but only abnormal results are displayed) Labs Reviewed  SARS CORONAVIRUS 2 (TAT 6-24 HRS)  CBC  BASIC METABOLIC PANEL  TROPONIN I (HIGH SENSITIVITY)   EKG None  Radiology DG Chest Port 1 View  Result Date: 01/17/2021 CLINICAL DATA:  Chest pain. EXAM: PORTABLE CHEST 1 VIEW COMPARISON:  01/06/2021. FINDINGS: Cardiac silhouette is normal in size. No mediastinal or hilar masses. Prominent markings noted in the lung bases, stable. Lungs otherwise clear. No pleural effusion or pneumothorax. Skeletal structures are grossly intact. IMPRESSION: No active disease. Electronically Signed   By: Lajean Manes M.D.   On: 01/17/2021  18:32    Procedures .Critical Care  Date/Time: 01/17/2021 6:48 PM Performed by: Rayna Sexton, PA-C Authorized by: Rayna Sexton, PA-C   Critical care provider statement:    Critical care time (minutes):  45   Critical care was necessary to treat or prevent imminent or life-threatening deterioration of the  following conditions: STEMI.   Critical care was time spent personally by me on the following activities:  Discussions with consultants, evaluation of patient's response to treatment, examination of patient, ordering and performing treatments and interventions, ordering and review of laboratory studies, ordering and review of radiographic studies, pulse oximetry, re-evaluation of patient's condition, obtaining history from patient or surrogate and review of old charts   Medications Ordered in ED Medications  aspirin tablet 325 mg (325 mg Oral Given 01/17/21 1826)  heparin injection 4,000 Units (4,000 Units Intravenous Given 01/17/21 1833)   ED Course  I have reviewed the triage vital signs and the nursing notes.  Pertinent labs & imaging results that were available during my care of the patient were reviewed by me and considered in my medical decision making (see chart for details).  Clinical Course as of 01/17/21 1906  Nancy Fetter Jan 17, 2021  1845 Cardiology evaluated the patient.  They will take him to the Cath Lab. [LJ]    Clinical Course User Index [LJ] Rayna Sexton, PA-C   MDM Rules/Calculators/A&P                          Pt is a 75 y.o. male who presents to the emergency department due to chest tightness that started this morning.  EMS was called and he was given 1 nitroglycerin with relief of his symptoms.  Code STEMI initiated upon arrival.  Confirmed with repeat ECG.  Troponin is currently pending.  Evaluated at bedside by cardiology staff who will be taking patient to the Cath Lab.  This was discussed with the patient and he was amenable.  Started on heparin as  well as aspirin.  Patient will require admission for further evaluation.  Note: Portions of this report may have been transcribed using voice recognition software. Every effort was made to ensure accuracy; however, inadvertent computerized transcription errors may be present.   Final Clinical Impression(s) / ED Diagnoses Final diagnoses:  ST elevation myocardial infarction (STEMI), unspecified artery [facility])    Rx / DC Orders ED Discharge Orders     None        Rayna Sexton, PA-C 01/17/21 1906    Isla Pence, MD 01/25/21 [phone]

## 2021-01-17 NOTE — Progress Notes (Signed)
Pt arrived from cath lab at about 2015. Placed on monitor in room.  Pt in mobitz I upon arrival. VSS. 2045- Dr. Tamala Julian at bedside to update patient and pt's wife about findings and plan of care. Around 2300, pt alarming bradycardia.  Rhythm changed noted. SBP dropped 30 points with change. Pt asymptomatic. RN paged cardiology fellow to update oh patient status at about 2340. Repeat 12 lead obtained. No new orders at this time.

## 2021-01-17 NOTE — ED Notes (Signed)
4000 heparin given per cards at bedside

## 2021-01-17 NOTE — ED Notes (Signed)
Cards at bedside

## 2021-01-17 NOTE — ED Triage Notes (Signed)
Pt with L sided chest pain onset 0830 while eating breakfast. Pt also endorses constipation, attempted to have a bowel movement and when bearing down, pt became shob and diaphoretic. Pt reports this has been going on all day. Recently dx with issues at c5-c6. Extensive cardiac hx. Type 1 av block. Given 1 nitro en route with resolution of his pain. Pt refused asa due to possible surgery soon.  148/101 HR 35-96 95% RA

## 2021-01-17 NOTE — CV Procedure (Addendum)
Acutely occluded first LV branch of RCA.  PDA 95% ostial and continuation of second LV branch with ostial 90%.  Distal right coronary collateralizes the distal circumflex. 75% distal left main 70 to 85% ostial to proximal LAD Ostial 80% followed by proximal 85% LAD proximal and distal to the first diagonal which contains proximal 70% narrowing.  The second diagonal also contains tight ostial disease. Proximal circumflex 99% with TIMI grade 2.5 flow and right to left collaterals LVEDP 8 mmHg.  EF 55%.  Severe three-vessel disease including left main with high-grade ostial and proximal LAD, proximal circumflex, and 90% ostial PDA, acute occlusion of first left ventricular branch, and 95% stenosis of the second left ventricular branch.  Mobitz 1 second-degree heart block.  Late presentation and if the first left ventricular branch is the culprit as I suspect, Mobitz 1 may be related.  He is stable with Mobitz 1 intermittent heart block.  I did not feel pacemaking was clinically indicated.  The patient will need to be considered for multivessel coronary artery bypass grafting.

## 2021-01-18 ENCOUNTER — Inpatient Hospital Stay (HOSPITAL_COMMUNITY): Payer: Medicare HMO

## 2021-01-18 ENCOUNTER — Encounter (HOSPITAL_COMMUNITY): Payer: Self-pay | Admitting: Interventional Cardiology

## 2021-01-18 DIAGNOSIS — I441 Atrioventricular block, second degree: Secondary | ICD-10-CM

## 2021-01-18 DIAGNOSIS — I2111 ST elevation (STEMI) myocardial infarction involving right coronary artery: Secondary | ICD-10-CM | POA: Diagnosis not present

## 2021-01-18 DIAGNOSIS — Z794 Long term (current) use of insulin: Secondary | ICD-10-CM

## 2021-01-18 DIAGNOSIS — I25110 Atherosclerotic heart disease of native coronary artery with unstable angina pectoris: Secondary | ICD-10-CM

## 2021-01-18 DIAGNOSIS — E785 Hyperlipidemia, unspecified: Secondary | ICD-10-CM

## 2021-01-18 DIAGNOSIS — F0390 Unspecified dementia without behavioral disturbance: Secondary | ICD-10-CM

## 2021-01-18 DIAGNOSIS — I10 Essential (primary) hypertension: Secondary | ICD-10-CM

## 2021-01-18 DIAGNOSIS — I213 ST elevation (STEMI) myocardial infarction of unspecified site: Secondary | ICD-10-CM

## 2021-01-18 DIAGNOSIS — Z01810 Encounter for preprocedural cardiovascular examination: Secondary | ICD-10-CM

## 2021-01-18 DIAGNOSIS — I248 Other forms of acute ischemic heart disease: Secondary | ICD-10-CM | POA: Diagnosis not present

## 2021-01-18 DIAGNOSIS — E118 Type 2 diabetes mellitus with unspecified complications: Secondary | ICD-10-CM | POA: Diagnosis not present

## 2021-01-18 LAB — ECHOCARDIOGRAM COMPLETE
Area-P 1/2: 3.58 cm2
Height: 75 in
S' Lateral: 2.9 cm
Weight: 3136 oz

## 2021-01-18 LAB — BASIC METABOLIC PANEL
Anion gap: 6 (ref 5–15)
BUN: 15 mg/dL (ref 8–23)
CO2: 25 mmol/L (ref 22–32)
Calcium: 8.5 mg/dL — ABNORMAL LOW (ref 8.9–10.3)
Chloride: 103 mmol/L (ref 98–111)
Creatinine, Ser: 1.09 mg/dL (ref 0.61–1.24)
GFR, Estimated: >60 mL/min (ref >60)
Glucose, Bld: 224 mg/dL — ABNORMAL HIGH (ref 70–99)
Potassium: 4.1 mmol/L (ref 3.5–5.1)
Sodium: 134 mmol/L — ABNORMAL LOW (ref 135–145)

## 2021-01-18 LAB — PREPARE RBC (CROSSMATCH)

## 2021-01-18 LAB — LIPID PANEL
Cholesterol: 156 mg/dL (ref 0–200)
HDL: 45 mg/dL (ref >40)
LDL Cholesterol: 71 mg/dL (ref 0–99)
Total CHOL/HDL Ratio: 3.5 RATIO
Triglycerides: 198 mg/dL — ABNORMAL HIGH (ref <150)
VLDL: 40 mg/dL (ref 0–40)

## 2021-01-18 LAB — SURGICAL PCR SCREEN
MRSA, PCR: NEGATIVE
Staphylococcus aureus: NEGATIVE

## 2021-01-18 LAB — MRSA NEXT GEN BY PCR, NASAL: MRSA by PCR Next Gen: NOT DETECTED

## 2021-01-18 LAB — TROPONIN I (HIGH SENSITIVITY): Troponin I (High Sensitivity): 2549 ng/L (ref <18)

## 2021-01-18 LAB — GLUCOSE, CAPILLARY
Glucose-Capillary: 175 mg/dL — ABNORMAL HIGH (ref 70–99)
Glucose-Capillary: 316 mg/dL — ABNORMAL HIGH (ref 70–99)
Glucose-Capillary: 206 mg/dL — ABNORMAL HIGH (ref 70–99)
Glucose-Capillary: 289 mg/dL — ABNORMAL HIGH (ref 70–99)

## 2021-01-18 LAB — PROTIME-INR
INR: 1 (ref 0.8–1.2)
Prothrombin Time: 13.2 seconds (ref 11.4–15.2)

## 2021-01-18 LAB — HEPARIN LEVEL (UNFRACTIONATED)
Heparin Unfractionated: 0.16 IU/mL — ABNORMAL LOW (ref 0.30–0.70)
Heparin Unfractionated: 0.42 IU/mL (ref 0.30–0.70)

## 2021-01-18 LAB — ABO/RH: ABO/RH(D): AB POS

## 2021-01-18 MED ORDER — INSULIN ASPART 100 UNIT/ML IJ SOLN
0.0000 [IU] | Freq: Three times a day (TID) | INTRAMUSCULAR | Status: DC
Start: 2021-01-18 — End: 2021-01-19
  Administered 2021-01-18: 11 [IU] via SUBCUTANEOUS
  Administered 2021-01-18: 5 [IU] via SUBCUTANEOUS

## 2021-01-18 MED ORDER — TRANEXAMIC ACID (OHS) BOLUS VIA INFUSION
15.0000 mg/kg | INTRAVENOUS | Status: AC
  Administered 2021-01-19: 1333.5 mg via INTRAVENOUS
  Filled 2021-01-18: qty 1334

## 2021-01-18 MED ORDER — SODIUM CHLORIDE 0.9 % IV SOLN
INTRAVENOUS | Status: DC
  Filled 2021-01-18: qty 30

## 2021-01-18 MED ORDER — NOREPINEPHRINE 4 MG/250ML-% IV SOLN
0.0000 ug/min | INTRAVENOUS | Status: DC
  Filled 2021-01-18: qty 250

## 2021-01-18 MED ORDER — PHENYLEPHRINE HCL-NACL 20-0.9 MG/250ML-% IV SOLN
30.0000 ug/min | INTRAVENOUS | Status: DC
  Filled 2021-01-18: qty 250

## 2021-01-18 MED ORDER — DEXMEDETOMIDINE HCL IN NACL 400 MCG/100ML IV SOLN
0.1000 ug/kg/h | INTRAVENOUS | Status: DC
  Filled 2021-01-18: qty 100

## 2021-01-18 MED ORDER — CHLORHEXIDINE GLUCONATE CLOTH 2 % EX PADS
6.0000 | MEDICATED_PAD | Freq: Every day | CUTANEOUS | Status: DC
  Administered 2021-01-18: 6 via TOPICAL

## 2021-01-18 MED ORDER — TEMAZEPAM 15 MG PO CAPS
15.0000 mg | ORAL_CAPSULE | Freq: Once | ORAL | Status: AC | PRN
  Administered 2021-01-18: 15 mg via ORAL
  Filled 2021-01-18: qty 1

## 2021-01-18 MED ORDER — EPINEPHRINE HCL 5 MG/250ML IV SOLN IN NS
0.0000 ug/min | INTRAVENOUS | Status: DC
Start: 2021-01-19 — End: 2021-01-19
  Filled 2021-01-18: qty 250

## 2021-01-18 MED ORDER — CEFAZOLIN SODIUM-DEXTROSE 2-4 GM/100ML-% IV SOLN
2.0000 g | INTRAVENOUS | Status: AC
  Administered 2021-01-19: 2 g via INTRAVENOUS
  Filled 2021-01-18 (×2): qty 100

## 2021-01-18 MED ORDER — MILRINONE LACTATE IN DEXTROSE 20-5 MG/100ML-% IV SOLN
0.3000 ug/kg/min | INTRAVENOUS | Status: DC
  Filled 2021-01-18: qty 100

## 2021-01-18 MED ORDER — CHLORHEXIDINE GLUCONATE CLOTH 2 % EX PADS
6.0000 | MEDICATED_PAD | Freq: Once | CUTANEOUS | Status: AC
  Administered 2021-01-19: 6 via TOPICAL

## 2021-01-18 MED ORDER — PLASMA-LYTE A IV SOLN
INTRAVENOUS | Status: DC
  Filled 2021-01-18: qty 5

## 2021-01-18 MED ORDER — POTASSIUM CHLORIDE 2 MEQ/ML IV SOLN
80.0000 meq | INTRAVENOUS | Status: DC
  Filled 2021-01-18: qty 40

## 2021-01-18 MED ORDER — METOPROLOL TARTRATE 12.5 MG HALF TABLET
12.5000 mg | ORAL_TABLET | Freq: Once | ORAL | Status: AC
  Administered 2021-01-19: 12.5 mg via ORAL
  Filled 2021-01-18: qty 1

## 2021-01-18 MED ORDER — TRANEXAMIC ACID 1000 MG/10ML IV SOLN
1.5000 mg/kg/h | INTRAVENOUS | Status: DC
  Filled 2021-01-18: qty 25

## 2021-01-18 MED ORDER — BISACODYL 5 MG PO TBEC
5.0000 mg | DELAYED_RELEASE_TABLET | Freq: Once | ORAL | Status: AC
  Administered 2021-01-18: 5 mg via ORAL
  Filled 2021-01-18: qty 1

## 2021-01-18 MED ORDER — NITROGLYCERIN IN D5W 200-5 MCG/ML-% IV SOLN
2.0000 ug/min | INTRAVENOUS | Status: AC
Start: 2021-01-19 — End: 2021-01-19
  Administered 2021-01-19: 20 ug/min via INTRAVENOUS
  Filled 2021-01-18 (×2): qty 250

## 2021-01-18 MED ORDER — CHLORHEXIDINE GLUCONATE CLOTH 2 % EX PADS
6.0000 | MEDICATED_PAD | Freq: Once | CUTANEOUS | Status: AC
  Administered 2021-01-18: 6 via TOPICAL

## 2021-01-18 MED ORDER — MUPIROCIN 2 % EX OINT
1.0000 | TOPICAL_OINTMENT | Freq: Two times a day (BID) | CUTANEOUS | Status: DC
Start: 2021-01-18 — End: 2021-01-19
  Administered 2021-01-18: 1 via NASAL
  Filled 2021-01-18: qty 22

## 2021-01-18 MED ORDER — VANCOMYCIN HCL 1500 MG/300ML IV SOLN
1500.0000 mg | INTRAVENOUS | Status: AC
  Administered 2021-01-19: 1500 mg via INTRAVENOUS
  Filled 2021-01-18 (×2): qty 300

## 2021-01-18 MED ORDER — CHLORHEXIDINE GLUCONATE 0.12 % MT SOLN
15.0000 mL | Freq: Once | OROMUCOSAL | Status: AC
  Administered 2021-01-19: 15 mL via OROMUCOSAL
  Filled 2021-01-18: qty 15

## 2021-01-18 MED ORDER — TRANEXAMIC ACID (OHS) PUMP PRIME SOLUTION
2.0000 mg/kg | INTRAVENOUS | Status: DC
  Filled 2021-01-18: qty 1.78

## 2021-01-18 MED ORDER — HYDROCODONE-ACETAMINOPHEN 5-325 MG PO TABS
1.0000 | ORAL_TABLET | Freq: Four times a day (QID) | ORAL | Status: DC | PRN
  Administered 2021-01-18: 1 via ORAL
  Filled 2021-01-18: qty 1

## 2021-01-18 MED ORDER — MANNITOL 20 % IV SOLN
Freq: Once | INTRAVENOUS | Status: DC
  Filled 2021-01-18: qty 13

## 2021-01-18 MED ORDER — GABAPENTIN 600 MG PO TABS
300.0000 mg | ORAL_TABLET | Freq: Three times a day (TID) | ORAL | Status: DC
  Administered 2021-01-18 (×2): 300 mg via ORAL
  Filled 2021-01-18 (×2): qty 1

## 2021-01-18 MED ORDER — INSULIN REGULAR(HUMAN) IN NACL 100-0.9 UT/100ML-% IV SOLN
INTRAVENOUS | Status: AC
  Administered 2021-01-19: 12 [IU]/h via INTRAVENOUS
  Filled 2021-01-18: qty 100

## 2021-01-18 NOTE — Progress Notes (Signed)
Inpatient Diabetes Program Recommendations  AACE/ADA: New Consensus Statement on Inpatient Glycemic Control   Target Ranges:  Prepandial:   less than 140 mg/dL      Peak postprandial:   less than 180 mg/dL (1-2 hours)      Critically ill patients:  140 - 180 mg/dL   Results for WIL, BLEE (MRN [id number]) as of 01/18/2021 11:21  Ref. Range 01/17/2021 21:41 01/18/2021 07:13 01/18/2021 11:10  Glucose-Capillary Latest Ref Range: 70 - 99 mg/dL 236 (H) 175 (H) 316 (H)    Review of Glycemic Control  Diabetes history: DM2 Outpatient Diabetes medications: Amaryl 1 mg QAM, Metformin 500 mg BID Current orders for Inpatient glycemic control: Novolog 0-15 units TID with meals  Inpatient Diabetes Program Recommendations:    Insulin: Please consider ordering Novolog 3 units TID with meals for meal coverage if patient eats at least 50% of meals.  Thanks, Barnie Alderman, RN, MSN, CDE Diabetes Coordinator Inpatient Diabetes Program [phone] (Team Pager from 8am to 5pm)

## 2021-01-18 NOTE — Progress Notes (Signed)
Pre-CABG Dopplers completed. Refer to "CV Proc" under chart review to view preliminary results.  01/18/2021 2:48 PM Kelby Aline., MHA, RVT, RDCS, RDMS

## 2021-01-18 NOTE — Progress Notes (Signed)
Paged by nurse regarding severe L shoulder pain radiating down the left arm not alleviated by current medication. Talking with the patient, his chest pain is gone in the past hour. He says he has significant cervical desk bulging disc that cause some nerve pain as well. The left arm pain is worse with head movement and arm movement. Suspect his current symptom is more associated with compressed cervical nerve rather than angina. He does have severe vessel CAD, pending CT surgery eval. Will restart home gabapentin and order PRN hydrocodone. Order EKG to make sure the patient does not have any ST elevation.

## 2021-01-18 NOTE — Consult Note (Addendum)
[address]       [place] [address]             [phone]        Bashir Lynn Guyton Melvin Medical Record [id number] Date of Birth: 10-May-1946  Referring: Tamala Julian Primary Care: Rusty Aus, MD Primary Cardiologist:Henry Carlye Grippe, MD  Chief Complaint:    Chief Complaint  Patient presents with   Chest Pain   History of Present Illness:      Mr. Beldin is a 75 yo male with known history of HTN, Hyperlipidemia, DM Type 2, Dementia, and known CAD with stent to the RCA placed in 1999.  He developed a several day complaint of chest, neck and, arm pain.  He initially presented to the ED about 9 days ago at which time workup was unremarkable.  However on 01/17/2021 the patients chest pain persisted and progressed and was substernal in nature.  He presented to the ED for evaluation with EKG which showed inferior q waves with elevation in II, III, and aVF.  Troponin level was elevated and he was ruled in for STEMI.  He was taken directly to the catheterization lab and found to have severe 3V CAD.  He was started on Heparin and NTG drip.  He was admitted and Cardiothoracic surgery consultation was requested for possible coronary bypass.  Currently the patient is chest pain free.  Overall he is active.  In regards to his Dementia it is in the early stages.  His wife and son state that he mainly has mild forgetfulness to this point.  His diabetes is well controlled with most recent A1c being 7.1.  He doesn't smoke.  He is agreeable to proceed with coronary bypass grafting.  Current Activity/ Functional Status: Patient is independent with mobility/ambulation, transfers, ADL's, IADL's.   Zubrod Score: At the time of surgery this patient's most appropriate activity status/level should be described as: '[]'$     0    Normal activity, no symptoms '[x]'$     1    Restricted in physical strenuous activity but ambulatory, able to do out light work '[]'$     2    Ambulatory and capable  of self care, unable to do work activities, up and about                 more than 50%  Of the time                            '[]'$     3    Only limited self care, in bed greater than 50% of waking hours '[]'$     4    Completely disabled, no self care, confined to bed or chair '[]'$     5    Moribund  Past Medical History:  Diagnosis Date   Diabetes mellitus ([place])    Hyperlipidemia    Hypertension     Past Surgical History:  Procedure Laterality Date   LEFT HEART CATH AND CORONARY ANGIOGRAPHY N/A 01/17/2021   Procedure: LEFT HEART CATH AND CORONARY ANGIOGRAPHY;  Surgeon: Belva Crome, MD;  Location: [facility];  Service: Cardiovascular;  Laterality: N/A;    Social History   Tobacco Use  Smoking Status Never  Smokeless Tobacco Never    Social History   Substance and Sexual Activity  Alcohol Use Never     No Known Allergies  Current Facility-Administered Medications  Medication Dose Route Frequency Provider Last Rate Last Admin   0.9 %  sodium chloride infusion  250 mL Intravenous PRN Belva Crome, MD       acetaminophen (TYLENOL) tablet 650 mg  650 mg Oral Q4H PRN Belva Crome, MD   650 mg at 01/18/21 [id number]   aspirin EC tablet 325 mg  325 mg Oral Daily Belva Crome, MD       atorvastatin (LIPITOR) tablet 80 mg  80 mg Oral Daily Doyne Keel., MD       Chlorhexidine Gluconate Cloth 2 % PADS 6 each  6 each Topical Daily Belva Crome, MD       donepezil (ARICEPT) tablet 5 mg  5 mg Oral QHS Doyne Keel., MD   5 mg at 01/17/21 2122   fenofibrate tablet 160 mg  160 mg Oral Daily Doyne Keel., MD       heparin ADULT infusion 100 units/mL (25000 units/286m)  1,100 Units/hr Intravenous Continuous MRolla Flatten RPH 11 mL/hr at 01/18/21 0900 1,100 Units/hr at 01/18/21 0900   lisinopril (ZESTRIL) tablet 5 mg  5 mg Oral Daily NDoyne Keel, MD       magnesium oxide (MAG-OX) tablet 400 mg  400 mg Oral Daily NDoyne Keel, MD        nitroGLYCERIN (NITROSTAT) SL tablet 0.4 mg  0.4 mg Sublingual Q5 Min x 3 PRN NDoyne Keel, MD       nitroGLYCERIN 50 mg in dextrose 5 % 250 mL (0.2 mg/mL) infusion  5-50 mcg/min Intravenous Titrated NDoyne Keel, MD 10.5 mL/hr at 01/18/21 0900 35 mcg/min at 01/18/21 0900   omeprazole (PRILOSEC) capsule 20 mg  20 mg Oral Daily NDoyne Keel, MD       ondansetron ([facility] injection 4 mg  4 mg Intravenous Q6H PRN SBelva Crome MD       sodium chloride flush (NS) 0.9 % injection 3 mL  3 mL Intravenous Q12H SBelva Crome MD       sodium chloride flush (NS) 0.9 % injection 3 mL  3 mL Intravenous PRN SBelva Crome MD       venlafaxine XR (EFFEXOR-XR) 24 hr capsule 37.5 mg  37.5 mg Oral Q breakfast NDoyne Keel, MD   37.5 mg at 01/18/21 0846    Medications Prior to Admission  Medication Sig Dispense Refill Last Dose   aspirin EC 81 MG tablet Take 81 mg by mouth daily. Swallow whole.   01/16/2021 at 2300   donepezil (ARICEPT) 5 MG tablet Take 5 mg by mouth at bedtime.   01/16/2021 at 2300   fenofibrate 160 MG tablet Take 160 mg by mouth daily.   [id number]  folic acid (FOLVITE) 1 MG tablet Take 1 mg by mouth daily.   01/17/2021 at 0800   glimepiride (AMARYL) 1 MG tablet Take 1 mg by mouth daily with breakfast.   01/17/2021 at 0800   lisinopril (ZESTRIL) 5 MG tablet Take 5 mg by mouth daily.   01/16/2021 at 2300   metFORMIN (GLUCOPHAGE) 500 MG tablet Take by mouth 2 (two) times daily with a meal.   01/17/2021 at 0800   methotrexate (RHEUMATREX) 2.5 MG tablet Take 2.5 mg by mouth once a week. Caution:Chemotherapy. Protect from light.   01/13/2021 at 0800   simvastatin (ZOCOR) 40 MG tablet Take 40 mg by mouth daily.   01/16/2021 at 2300   venlafaxine XR (EFFEXOR-XR) 37.5  MG 24 hr capsule Take 37.5 mg by mouth daily with breakfast.   01/17/2021 at 0800   vitamin B-12 (CYANOCOBALAMIN) 1000 MCG tablet Take 1,000 mcg by mouth daily.   01/17/2021 at 0800    HYDROcodone-acetaminophen (NORCO) 7.5-325 MG tablet Take 1 tablet by mouth every 4 (four) hours as needed for pain.      magnesium oxide (MAG-OX) 400 MG tablet Take 400 mg by mouth daily.   Unknown   omeprazole (PRILOSEC OTC) 20 MG tablet Take 20 mg by mouth daily.   Unknown    History reviewed. No pertinent family history.   Review of Systems:   ROS    Cardiac Review of Systems: Y or  [    ]= no  Chest Pain [ Y   ]  Resting SOB [   ] Exertional SOB  [ N ]  Orthopnea [  ]   Pedal Edema [   ]    Palpitations [  ] Syncope  [  ]   Presyncope [   ]  General Review of Systems: [Y] = yes [  ]=no Constitional: recent weight change [  ]; anorexia [  ]; fatigue [ Y ]; nausea [ N ]; night sweats [  ]; fever [  ]; or chills [  ]                                                               Dental: Last Dentist visit:   Eye : blurred vision [  ]; diplopia [   ]; vision changes [ N ];  Amaurosis fugax[  ]; Resp: cough [  ];  wheezing[  ];  hemoptysis[  ]; shortness of breath[  ]; paroxysmal nocturnal dyspnea[  ]; dyspnea on exertion[  ]; or orthopnea[  ];  GI:  gallstones[  ], vomiting[ N ];  dysphagia[  ]; melena[  ];  hematochezia [  ]; heartburn[  ];   Hx of  Colonoscopy[  ]; GU: kidney stones [  ]; hematuria[  ];   dysuria [  ];  nocturia[  ];  history of     obstruction [  ]; urinary frequency [  ]             Skin: rash, swelling[ N ];, hair loss[  ];  peripheral edema[N  ];  or itching[  ]; Musculosketetal: myalgias[  ];  joint swelling[  ];  joint erythema[  ];  joint pain[  ];  back pain[  ];  Heme/Lymph: bruising[  ];  bleeding[  ];  anemia[  ];  Neuro: TIA[  ];  headaches[  ];  stroke[ N ];  vertigo[  ];  seizures[  ];   paresthesias[  ];  difficulty walking[N  ]; + early dementia  Psych:depression[  ]; anxiety[  ];  Endocrine: diabetes[ x ];  thyroid dysfunction[  ];             Patient has routine dental cleanings every 6 months , needs to have crown for cracked tooth    Physical  Exam: BP 133/84   Pulse 80   Temp 97.8 F (36.6 C) (Oral)   Resp (!) 21   Ht '6\' 3"'$  (1.905 m)   Wt 88.9 kg   SpO2  92%   BMI 24.50 kg/m   General appearance: alert, cooperative, and no distress Head: Normocephalic, without obvious abnormality, atraumatic Neck: no adenopathy, no carotid bruit, no JVD, supple, symmetrical, trachea midline, and thyroid not enlarged, symmetric, no tenderness/mass/nodules Resp: clear to auscultation bilaterally Cardio: regular rate and rhythm GI: soft, non-tender; bowel sounds normal; no masses,  no organomegaly Extremities: extremities normal, atraumatic, no cyanosis or edema Neurologic: Grossly normal  Diagnostic Studies & Laboratory data:     Recent Radiology Findings:   CARDIAC CATHETERIZATION  Result Date: 01/17/2021 80% distal left main Ostial to proximal 85% LAD, 80% first diagonal, 80% second diagonal, and apical LAD 75%. 99% proximal circumflex.  Distal circumflex fills faintly by right to left collaterals. Dominant right coronary with patent distal RCA stent, 90% ostial PDA, total occlusion of the first LV branch, and 95% stenosis of the second LV branch. Overall normal LV function with EF 55%.  Focal mid inferior wall akinesis.  LVEDP 8 mmHg. RECOMMENDATIONS: Anatomy suggest that surgical revascularization would be the best treatment for this patient assuming he has no contraindications to surgery.  He is relatively stable at this time.  Plan to consult T CTS in a.m. IV heparin will be resumed in 6 to 8 hours IV nitroglycerin will be started With relatively low LV filling pressures, IV saline is started   [facility] Chest Port 1 View  Result Date: 01/17/2021 CLINICAL DATA:  Chest pain. EXAM: PORTABLE CHEST 1 VIEW COMPARISON:  01/06/2021. FINDINGS: Cardiac silhouette is normal in size. No mediastinal or hilar masses. Prominent markings noted in the lung bases, stable. Lungs otherwise clear. No pleural effusion or pneumothorax. Skeletal structures are grossly  intact. IMPRESSION: No active disease. Electronically Signed   By: Lajean Manes M.D.   On: 01/17/2021 18:32     I have independently reviewed the above radiologic studies and discussed with the patient   Recent Lab Findings: Lab Results  Component Value Date   WBC 6.1 01/17/2021   HGB 13.5 01/17/2021   HCT 39.7 01/17/2021   PLT 214 01/17/2021   GLUCOSE 224 (H) 01/17/2021   CHOL 156 01/17/2021   TRIG 198 (H) 01/17/2021   HDL 45 01/17/2021   LDLCALC 71 01/17/2021   NA 134 (L) 01/17/2021   K 4.1 01/17/2021   CL 103 01/17/2021   CREATININE 1.09 01/17/2021   BUN 15 01/17/2021   CO2 25 01/17/2021   INR 1.0 01/17/2021   Assessment / Plan:      CV- severe 3V CAD, requesting CABG consult- currently on Heparin and NTG drip with  HTN HLD DM- well controlled on oral medications, last A1c was 7.1 per patient's wife Early Dementia- on Aricept, patient with mild forgetfulness Dispo- patient currently chest pain free, patient appears to be good candidate for bypass surgery.  Dr. Kipp Brood will evaluate, for possible bypass surgery tomorrow morning.  I  spent 55 minutes counseling the patient face to face.  Ellwood Handler, PA-C 01/18/2021 9:41 AM  Agree with above. This is a 75 year old gentleman is admitted with left main/three-vessel coronary artery disease.  He originally presented with anginal symptoms and was ruled in for STEMI.  He does have a history of coronary artery disease and has had stent placement in the right systems in the past.  Review of his images he does have good distal targets in the LAD, and circumflex distribution.  The stent on the right coronary appears patent but he does have some distal disease in the PDA and posterolateral  branch.  His echocardiogram shows preserved RV and LV function and no significant valvular disease.  We will review of his EKG, he does have a new second-degree block.  We discussed the risks and benefits of surgical revascularization and he is  agreeable to proceed.  He is scheduled for 01/19/2021.  Braniyah Besse Bary Leriche

## 2021-01-18 NOTE — Progress Notes (Signed)
Progress Note  Patient Name: [facility] Date of Encounter: 01/18/2021  [facility] Cardiologist: Belva Crome III, MD   Subjective   No chest pain this morning.  Wife is not present.  Seems understand that he has severe coronary disease and needs surgery.  Wants to know when he will see the surgeon.  He was informed that it would be later today or tomorrow.  Inpatient Medications    Scheduled Meds:  aspirin EC  325 mg Oral Daily   atorvastatin  80 mg Oral Daily   Chlorhexidine Gluconate Cloth  6 each Topical Daily   donepezil  5 mg Oral QHS   fenofibrate  160 mg Oral Daily   insulin aspart  0-15 Units Subcutaneous TID WC   lisinopril  5 mg Oral Daily   magnesium oxide  400 mg Oral Daily   omeprazole  20 mg Oral Daily   sodium chloride flush  3 mL Intravenous Q12H   venlafaxine XR  37.5 mg Oral Q breakfast   Continuous Infusions:  sodium chloride     heparin 1,100 Units/hr (01/18/21 1000)   nitroGLYCERIN 35 mcg/min (01/18/21 1000)   PRN Meds: sodium chloride, acetaminophen, nitroGLYCERIN, ondansetron (ZOFRAN) IV, sodium chloride flush   Vital Signs    Vitals:   01/18/21 1000 01/18/21 1015 01/18/21 1017 01/18/21 1030  BP: 122/84 137/78 137/78 133/82  Pulse: 98 (!) 151  90  Resp: (!) 21 (!) 25  (!) 29  Temp:      TempSrc:      SpO2: 96% 95%  96%  Weight:      Height:        Intake/Output Summary (Last 24 hours) at 01/18/2021 1057 Last data filed at 01/18/2021 1000 Gross per 24 hour  Intake 945.71 ml  Output 975 ml  Net -29.29 ml   Last 3 Weights 01/17/2021  Weight (lbs) 196 lb  Weight (kg) 88.905 kg      Telemetry    Sinus rhythm with first-degree AV block and intermittent Mobitz 1 second-degree heart block- Personally Reviewed  ECG    Inferior Q waves, sinus rhythm with 2-1 second-degree heart block, likely Mobitz 1.- Personally Reviewed  Physical Exam  Lying flat, comfortable. GEN: No acute distress.   Neck: No JVD Cardiac: RRR, no  murmurs, rubs, or gallops.  Right femoral cath site is unremarkable.  No hematoma. Respiratory: Clear to auscultation bilaterally. GI: Soft, nontender, non-distended  MS: No edema; No deformity. Neuro:  Nonfocal.  Decreased memory. Psych: Normal affect   Labs    High Sensitivity Troponin:   Recent Labs  Lab 01/06/21 1601 01/06/21 1801 01/17/21 1801 01/17/21 2106 01/17/21 2325  TROPONINIHS 11 13 394* 381* 2,549*      Chemistry Recent Labs  Lab 01/17/21 1801 01/17/21 2325  NA 132* 134*  K 4.7 4.1  CL 101 103  CO2 23 25  GLUCOSE 343* 224*  BUN 15 15  CREATININE 1.28* 1.09  CALCIUM 9.3 8.5*  GFRNONAA 58* >60  ANIONGAP 8 6     Hematology Recent Labs  Lab 01/17/21 1801 01/17/21 2325  WBC 7.9 6.1  RBC 4.79 4.44  HGB 14.6 13.5  HCT 42.7 39.7  MCV 89.1 89.4  MCH 30.5 30.4  MCHC 34.2 34.0  RDW 12.7 12.6  PLT 230 214    BNPNo results for input(s): BNP, PROBNP in the last 168 hours.   DDimer No results for input(s): DDIMER in the last 168 hours.   Radiology  CARDIAC CATHETERIZATION  Result Date: 01/17/2021 80% distal left main Ostial to proximal 85% LAD, 80% first diagonal, 80% second diagonal, and apical LAD 75%. 99% proximal circumflex.  Distal circumflex fills faintly by right to left collaterals. Dominant right coronary with patent distal RCA stent, 90% ostial PDA, total occlusion of the first LV branch, and 95% stenosis of the second LV branch. Overall normal LV function with EF 55%.  Focal mid inferior wall akinesis.  LVEDP 8 mmHg. RECOMMENDATIONS: Anatomy suggest that surgical revascularization would be the best treatment for this patient assuming he has no contraindications to surgery.  He is relatively stable at this time.  Plan to consult T CTS in a.m. IV heparin will be resumed in 6 to 8 hours IV nitroglycerin will be started With relatively low LV filling pressures, IV saline is started   [facility] Chest Port 1 View  Result Date: 01/17/2021 CLINICAL DATA:   Chest pain. EXAM: PORTABLE CHEST 1 VIEW COMPARISON:  01/06/2021. FINDINGS: Cardiac silhouette is normal in size. No mediastinal or hilar masses. Prominent markings noted in the lung bases, stable. Lungs otherwise clear. No pleural effusion or pneumothorax. Skeletal structures are grossly intact. IMPRESSION: No active disease. Electronically Signed   By: Lajean Manes M.D.   On: 01/17/2021 18:32    Cardiac Studies   Echocardiogram preliminary shows mid inferior wall hypokinesis.  Overall EF greater than 55%. Coronary angiography demonstrated severe three-vessel CAD including left main. 01/17/2021 Diagnostic Dominance: Right   Patient Profile     75 y.o. male hypertension, early dementia, primary hypertension, hyperlipidemia, diabetes mellitus type 2, and ST elevation MI related to occlusion of LV branch of RCA that was too far distal and too small to treat interventionally.  Assessment & Plan    ST elevation MI: Total occlusion of first left ventricular branch which was too far distal and complex to intervene upon.  Presentation was late. Coronary artery disease: The patient has severe left main and three-vessel CAD most appropriately treated with coronary artery bypass grafting (survival benefits). Cognitive impairment: This may come into play relative to treatment options. Hyperlipidemia: Target LDL less than 70 Diabetes mellitus type 2: Check hemoglobin A1c Primary hypertension: Target less than 140/80 mmHg.  Continue lisinopril.  Avoiding beta-blocker therapy for now due to problem #7 below. AV node conduction system disease: Prior history of first-degree AV block.  Mobitz 1 second-degree heart block is intermittent.  Would be prudent to hold beta-blocker therapy.  For questions or updates, please contact [place] Please consult [email or web address] for contact info under        Signed, Sinclair Grooms, MD  01/18/2021, 10:57 AM

## 2021-01-18 NOTE — Anesthesia Preprocedure Evaluation (Addendum)
Anesthesia Evaluation  Patient identified by MRN, date of birth, ID band Patient awake    Reviewed: Allergy & Precautions, NPO status , Patient's Chart, lab work & pertinent test results  History of Anesthesia Complications Negative for: history of anesthetic complications  Airway Mallampati: II  TM Distance: >3 FB Neck ROM: Full    Dental  (+) Dental Advisory Given, Chipped, Caps,    Pulmonary neg pulmonary ROS,    Pulmonary exam normal        Cardiovascular hypertension, Pt. on medications + CAD and + Past MI   Rhythm:Regular Rate:Bradycardia   '22 TTE - EF 55 to 60%. Left ventricular endocardial border not optimally defined to evaluate regional wall motion. Moderate left ventricular hypertrophy. Grade I diastolic dysfunction (impaired relaxation). No valvulopathy.  '22 Cath - 80% distal left main Ostial to proximal 85% LAD, 80% first diagonal, 80% second diagonal, and apical LAD 75%. 99% proximal circumflex.  Distal circumflex fills faintly by right to left collaterals. Dominant right coronary with patent distal RCA stent, 90% ostial PDA, total occlusion of the first LV branch, and 95% stenosis of the second LV branch. Overall normal LV function with EF 55%.  Focal mid inferior wall akinesis.  LVEDP 8 mmHg.  '22 Carotid US - 1-39% b/l ICAS    Neuro/Psych negative neurological ROS  negative psych ROS   GI/Hepatic negative GI ROS, Neg liver ROS,   Endo/Other  diabetes, Type 2, Oral Hypoglycemic Agents  Renal/GU negative Renal ROS     Musculoskeletal negative musculoskeletal ROS (+)   Abdominal   Peds  Hematology negative hematology ROS (+)   Anesthesia Other Findings Covid test negative   Reproductive/Obstetrics                            Anesthesia Physical Anesthesia Plan  ASA: 4  Anesthesia Plan: General   Post-op Pain Management:    Induction: Intravenous  PONV Risk  Score and Plan: 2 and Treatment may vary due to age or medical condition  Airway Management Planned: Oral ETT  Additional Equipment: Arterial line, CVP, TEE and Ultrasound Guidance Line Placement  Intra-op Plan:   Post-operative Plan: Post-operative intubation/ventilation  Informed Consent: I have reviewed the patients History and Physical, chart, labs and discussed the procedure including the risks, benefits and alternatives for the proposed anesthesia with the patient or authorized representative who has indicated his/her understanding and acceptance.     Dental advisory given  Plan Discussed with: CRNA and Anesthesiologist  Anesthesia Plan Comments: (FloTrac per surgeon request)       Anesthesia Quick Evaluation

## 2021-01-18 NOTE — Progress Notes (Signed)
0210- Pt called RN to bedside. Complaining of chest pain 3-4/10, but stating that pain is now radiating down L arm. Nitro gtt infusing. 0220- paged Dr. Marcelle Smiling about new pain and for clarification of nitro gtt orders. OK to titrate gtt if pt tolerated per MD.

## 2021-01-18 NOTE — Progress Notes (Signed)
ANTICOAGULATION CONSULT NOTE   Pharmacy Consult for Heparin Indication: 3vCAD  No Known Allergies  Patient Measurements: Height: '6\' 3"'$  (190.5 cm) Weight: 88.9 kg (196 lb) IBW/kg (Calculated) : 84.5 Heparin Dosing Weight: 88 kg  Vital Signs: Temp: 98.2 F (36.8 C) (07/25 1112) Temp Source: Oral (07/25 1112) BP: 133/85 (07/25 1135) Pulse Rate: 93 (07/25 1135)  Labs: Recent Labs    01/17/21 1801 01/17/21 2106 01/17/21 2325 01/18/21 1147  HGB 14.6  --  13.5  --   HCT 42.7  --  39.7  --   PLT 230  --  214  --   LABPROT  --   --  13.2  --   INR  --   --  1.0  --   HEPARINUNFRC  --   --   --  0.16*  CREATININE 1.28*  --  1.09  --   TROPONINIHS 394* 381* 2,549*  --      Estimated Creatinine Clearance: 70 mL/min (by C-G formula based on SCr of 1.09 mg/dL).   Medical History: Past Medical History:  Diagnosis Date   Diabetes mellitus ([place])    Hyperlipidemia    Hypertension     Medications:  Infusions:   sodium chloride     heparin 1,100 Units/hr (01/18/21 1100)   nitroGLYCERIN 35 mcg/min (01/18/21 1100)    Assessment: 75 YOM who presented on 7/24 with CP/STEMI with emergent cath on 7/24 revealing 3vCAD. Pharmacy consulted to resume Heparin post cath. Possible CABG 7/26 -heparin level= 0.16 on 1100 units/hr   Goal of Therapy:  Heparin level 0.3-0.7 units/ml Monitor platelets by anticoagulation protocol: Yes   Plan:  -increase heparin to 1350 units/hr -Heparin level in 8 hours and daily wth CBC daily  Hildred Laser, PharmD Clinical Pharmacist **Pharmacist phone directory can now be found on [email or web address] ([email or web address]).  Listed under [place].

## 2021-01-18 NOTE — Progress Notes (Signed)
  Echocardiogram 2D Echocardiogram has been performed.  Darlina Sicilian M 01/18/2021, 9:12 AM

## 2021-01-18 NOTE — Progress Notes (Signed)
ANTICOAGULATION CONSULT NOTE - Follow Up Consult  Pharmacy Consult for heparin Indication:  CAD awaiting CABG   Labs: Recent Labs    01/17/21 1801 01/17/21 2106 01/17/21 2325 01/18/21 1147 01/18/21 2207  HGB 14.6  --  13.5  --   --   HCT 42.7  --  39.7  --   --   PLT 230  --  214  --   --   LABPROT  --   --  13.2  --   --   INR  --   --  1.0  --   --   HEPARINUNFRC  --   --   --  0.16* 0.42  CREATININE 1.28*  --  1.09  --   --   TROPONINIHS 394* 381* 2,549*  --   --     Assessment/Plan:  75yo male therapeutic on heparin after rate change. Will continue infusion at current rate of 1350 units/hr until off for OR.   Wynona Neat, PharmD, BCPS  01/18/2021,10:49 PM

## 2021-01-19 ENCOUNTER — Inpatient Hospital Stay (HOSPITAL_COMMUNITY): Payer: Medicare HMO

## 2021-01-19 ENCOUNTER — Inpatient Hospital Stay (HOSPITAL_COMMUNITY): Payer: Medicare HMO | Admitting: Registered Nurse

## 2021-01-19 ENCOUNTER — Inpatient Hospital Stay (HOSPITAL_COMMUNITY)
Admission: EM | Disposition: A | Discharge status: Home / Self Care | Payer: Self-pay | Source: Home / Self Care | Attending: Thoracic Surgery (Cardiothoracic Vascular Surgery)

## 2021-01-19 DIAGNOSIS — Z951 Presence of aortocoronary bypass graft: Secondary | ICD-10-CM

## 2021-01-19 DIAGNOSIS — I25110 Atherosclerotic heart disease of native coronary artery with unstable angina pectoris: Secondary | ICD-10-CM

## 2021-01-19 HISTORY — PX: ENDOVEIN HARVEST OF GREATER SAPHENOUS VEIN: SHX5059

## 2021-01-19 HISTORY — PX: CORONARY ARTERY BYPASS GRAFT: SHX141

## 2021-01-19 HISTORY — PX: TEE WITHOUT CARDIOVERSION: SHX5443

## 2021-01-19 LAB — CBC
HCT: 40.8 % (ref 39.0–52.0)
HCT: 31.9 % — ABNORMAL LOW (ref 39.0–52.0)
HCT: 31.9 % — ABNORMAL LOW (ref 39.0–52.0)
Hemoglobin: 13.6 g/dL (ref 13.0–17.0)
Hemoglobin: 11 g/dL — ABNORMAL LOW (ref 13.0–17.0)
Hemoglobin: 11 g/dL — ABNORMAL LOW (ref 13.0–17.0)
MCH: 30.1 pg (ref 26.0–34.0)
MCH: 30.8 pg (ref 26.0–34.0)
MCH: 30.9 pg (ref 26.0–34.0)
MCHC: 33.3 g/dL (ref 30.0–36.0)
MCHC: 34.5 g/dL (ref 30.0–36.0)
MCHC: 34.5 g/dL (ref 30.0–36.0)
MCV: 90.3 fL (ref 80.0–100.0)
MCV: 89.4 fL (ref 80.0–100.0)
MCV: 89.6 fL (ref 80.0–100.0)
Platelets: 213 10*3/uL (ref 150–400)
Platelets: 122 10*3/uL — ABNORMAL LOW (ref 150–400)
Platelets: 136 10*3/uL — ABNORMAL LOW (ref 150–400)
RBC: 4.52 MIL/uL (ref 4.22–5.81)
RBC: 3.57 MIL/uL — ABNORMAL LOW (ref 4.22–5.81)
RBC: 3.56 MIL/uL — ABNORMAL LOW (ref 4.22–5.81)
RDW: 12.9 % (ref 11.5–15.5)
RDW: 12.8 % (ref 11.5–15.5)
RDW: 13 % (ref 11.5–15.5)
WBC: 8.2 10*3/uL (ref 4.0–10.5)
WBC: 8.7 10*3/uL (ref 4.0–10.5)
WBC: 7.8 10*3/uL (ref 4.0–10.5)
nRBC: 0 % (ref 0.0–0.2)
nRBC: 0 % (ref 0.0–0.2)
nRBC: 0 % (ref 0.0–0.2)

## 2021-01-19 LAB — POCT I-STAT, CHEM 8
BUN: 12 mg/dL (ref 8–23)
BUN: 9 mg/dL (ref 8–23)
BUN: 12 mg/dL (ref 8–23)
BUN: 11 mg/dL (ref 8–23)
BUN: 9 mg/dL (ref 8–23)
Calcium, Ion: 1.32 mmol/L (ref 1.15–1.40)
Calcium, Ion: 1.12 mmol/L — ABNORMAL LOW (ref 1.15–1.40)
Calcium, Ion: 1.14 mmol/L — ABNORMAL LOW (ref 1.15–1.40)
Calcium, Ion: 1.15 mmol/L (ref 1.15–1.40)
Calcium, Ion: 1.13 mmol/L — ABNORMAL LOW (ref 1.15–1.40)
Chloride: 103 mmol/L (ref 98–111)
Chloride: 109 mmol/L (ref 98–111)
Chloride: 103 mmol/L (ref 98–111)
Chloride: 104 mmol/L (ref 98–111)
Chloride: 111 mmol/L (ref 98–111)
Creatinine, Ser: 0.7 mg/dL (ref 0.61–1.24)
Creatinine, Ser: 0.5 mg/dL — ABNORMAL LOW (ref 0.61–1.24)
Creatinine, Ser: 0.7 mg/dL (ref 0.61–1.24)
Creatinine, Ser: 0.7 mg/dL (ref 0.61–1.24)
Creatinine, Ser: 0.4 mg/dL — ABNORMAL LOW (ref 0.61–1.24)
Glucose, Bld: 241 mg/dL — ABNORMAL HIGH (ref 70–99)
Glucose, Bld: 160 mg/dL — ABNORMAL HIGH (ref 70–99)
Glucose, Bld: 142 mg/dL — ABNORMAL HIGH (ref 70–99)
Glucose, Bld: 132 mg/dL — ABNORMAL HIGH (ref 70–99)
Glucose, Bld: 118 mg/dL — ABNORMAL HIGH (ref 70–99)
HCT: 38 % — ABNORMAL LOW (ref 39.0–52.0)
HCT: 29 % — ABNORMAL LOW (ref 39.0–52.0)
HCT: 26 % — ABNORMAL LOW (ref 39.0–52.0)
HCT: 26 % — ABNORMAL LOW (ref 39.0–52.0)
HCT: 21 % — ABNORMAL LOW (ref 39.0–52.0)
Hemoglobin: 12.9 g/dL — ABNORMAL LOW (ref 13.0–17.0)
Hemoglobin: 9.9 g/dL — ABNORMAL LOW (ref 13.0–17.0)
Hemoglobin: 8.8 g/dL — ABNORMAL LOW (ref 13.0–17.0)
Hemoglobin: 8.8 g/dL — ABNORMAL LOW (ref 13.0–17.0)
Hemoglobin: 7.1 g/dL — ABNORMAL LOW (ref 13.0–17.0)
Potassium: 3.9 mmol/L (ref 3.5–5.1)
Potassium: 2.8 mmol/L — ABNORMAL LOW (ref 3.5–5.1)
Potassium: 4.3 mmol/L (ref 3.5–5.1)
Potassium: 4.1 mmol/L (ref 3.5–5.1)
Potassium: 3.2 mmol/L — ABNORMAL LOW (ref 3.5–5.1)
Sodium: 139 mmol/L (ref 135–145)
Sodium: 143 mmol/L (ref 135–145)
Sodium: 138 mmol/L (ref 135–145)
Sodium: 139 mmol/L (ref 135–145)
Sodium: 143 mmol/L (ref 135–145)
TCO2: 28 mmol/L (ref 22–32)
TCO2: 20 mmol/L — ABNORMAL LOW (ref 22–32)
TCO2: 26 mmol/L (ref 22–32)
TCO2: 27 mmol/L (ref 22–32)
TCO2: 21 mmol/L — ABNORMAL LOW (ref 22–32)

## 2021-01-19 LAB — POCT I-STAT 7, (LYTES, BLD GAS, ICA,H+H)
Acid-Base Excess: 1 mmol/L (ref 0.0–2.0)
Acid-Base Excess: 3 mmol/L — ABNORMAL HIGH (ref 0.0–2.0)
Acid-Base Excess: 3 mmol/L — ABNORMAL HIGH (ref 0.0–2.0)
Acid-base deficit: 3 mmol/L — ABNORMAL HIGH (ref 0.0–2.0)
Bicarbonate: 27 mmol/L (ref 20.0–28.0)
Bicarbonate: 28.3 mmol/L — ABNORMAL HIGH (ref 20.0–28.0)
Bicarbonate: 26.8 mmol/L (ref 20.0–28.0)
Bicarbonate: 22 mmol/L (ref 20.0–28.0)
Calcium, Ion: 1.3 mmol/L (ref 1.15–1.40)
Calcium, Ion: 1.1 mmol/L — ABNORMAL LOW (ref 1.15–1.40)
Calcium, Ion: 1.11 mmol/L — ABNORMAL LOW (ref 1.15–1.40)
Calcium, Ion: 1.11 mmol/L — ABNORMAL LOW (ref 1.15–1.40)
HCT: 39 % (ref 39.0–52.0)
HCT: 27 % — ABNORMAL LOW (ref 39.0–52.0)
HCT: 26 % — ABNORMAL LOW (ref 39.0–52.0)
HCT: 24 % — ABNORMAL LOW (ref 39.0–52.0)
Hemoglobin: 13.3 g/dL (ref 13.0–17.0)
Hemoglobin: 9.2 g/dL — ABNORMAL LOW (ref 13.0–17.0)
Hemoglobin: 8.8 g/dL — ABNORMAL LOW (ref 13.0–17.0)
Hemoglobin: 8.2 g/dL — ABNORMAL LOW (ref 13.0–17.0)
O2 Saturation: 100 %
O2 Saturation: 100 %
O2 Saturation: 100 %
O2 Saturation: 100 %
Patient temperature: 37
Patient temperature: 37
Potassium: 3.9 mmol/L (ref 3.5–5.1)
Potassium: 3.8 mmol/L (ref 3.5–5.1)
Potassium: 4.2 mmol/L (ref 3.5–5.1)
Potassium: 3 mmol/L — ABNORMAL LOW (ref 3.5–5.1)
Sodium: 140 mmol/L (ref 135–145)
Sodium: 139 mmol/L (ref 135–145)
Sodium: 139 mmol/L (ref 135–145)
Sodium: 145 mmol/L (ref 135–145)
TCO2: 28 mmol/L (ref 22–32)
TCO2: 30 mmol/L (ref 22–32)
TCO2: 28 mmol/L (ref 22–32)
TCO2: 23 mmol/L (ref 22–32)
pCO2 arterial: 48.8 mmHg — ABNORMAL HIGH (ref 32.0–48.0)
pCO2 arterial: 48.4 mmHg — ABNORMAL HIGH (ref 32.0–48.0)
pCO2 arterial: 38 mmHg (ref 32.0–48.0)
pCO2 arterial: 38.1 mmHg (ref 32.0–48.0)
pH, Arterial: 7.35 (ref 7.350–7.450)
pH, Arterial: 7.375 (ref 7.350–7.450)
pH, Arterial: 7.455 — ABNORMAL HIGH (ref 7.350–7.450)
pH, Arterial: 7.37 (ref 7.350–7.450)
pO2, Arterial: 400 mmHg — ABNORMAL HIGH (ref 83.0–108.0)
pO2, Arterial: 309 mmHg — ABNORMAL HIGH (ref 83.0–108.0)
pO2, Arterial: 288 mmHg — ABNORMAL HIGH (ref 83.0–108.0)
pO2, Arterial: 450 mmHg — ABNORMAL HIGH (ref 83.0–108.0)

## 2021-01-19 LAB — BASIC METABOLIC PANEL
Anion gap: 8 (ref 5–15)
BUN: 13 mg/dL (ref 8–23)
CO2: 24 mmol/L (ref 22–32)
Calcium: 9.2 mg/dL (ref 8.9–10.3)
Chloride: 105 mmol/L (ref 98–111)
Creatinine, Ser: 1.04 mg/dL (ref 0.61–1.24)
GFR, Estimated: >60 mL/min (ref >60)
Glucose, Bld: 226 mg/dL — ABNORMAL HIGH (ref 70–99)
Potassium: 4 mmol/L (ref 3.5–5.1)
Sodium: 137 mmol/L (ref 135–145)

## 2021-01-19 LAB — ECHO INTRAOPERATIVE TEE
Height: 75 in
Weight: 3125.24 oz

## 2021-01-19 LAB — COMPREHENSIVE METABOLIC PANEL
ALT: 19 U/L (ref 0–44)
AST: 57 U/L — ABNORMAL HIGH (ref 15–41)
Albumin: 3.4 g/dL — ABNORMAL LOW (ref 3.5–5.0)
Alkaline Phosphatase: 24 U/L — ABNORMAL LOW (ref 38–126)
Anion gap: 5 (ref 5–15)
BUN: 9 mg/dL (ref 8–23)
CO2: 22 mmol/L (ref 22–32)
Calcium: 8 mg/dL — ABNORMAL LOW (ref 8.9–10.3)
Chloride: 111 mmol/L (ref 98–111)
Creatinine, Ser: 0.87 mg/dL (ref 0.61–1.24)
GFR, Estimated: >60 mL/min (ref >60)
Glucose, Bld: 105 mg/dL — ABNORMAL HIGH (ref 70–99)
Potassium: 4.1 mmol/L (ref 3.5–5.1)
Sodium: 138 mmol/L (ref 135–145)
Total Bilirubin: 1.1 mg/dL (ref 0.3–1.2)
Total Protein: 4.9 g/dL — ABNORMAL LOW (ref 6.5–8.1)

## 2021-01-19 LAB — GLUCOSE, CAPILLARY
Glucose-Capillary: 110 mg/dL — ABNORMAL HIGH (ref 70–99)
Glucose-Capillary: 99 mg/dL (ref 70–99)
Glucose-Capillary: 114 mg/dL — ABNORMAL HIGH (ref 70–99)
Glucose-Capillary: 113 mg/dL — ABNORMAL HIGH (ref 70–99)
Glucose-Capillary: 116 mg/dL — ABNORMAL HIGH (ref 70–99)
Glucose-Capillary: 113 mg/dL — ABNORMAL HIGH (ref 70–99)
Glucose-Capillary: 112 mg/dL — ABNORMAL HIGH (ref 70–99)
Glucose-Capillary: 99 mg/dL (ref 70–99)
Glucose-Capillary: 160 mg/dL — ABNORMAL HIGH (ref 70–99)
Glucose-Capillary: 141 mg/dL — ABNORMAL HIGH (ref 70–99)
Glucose-Capillary: 138 mg/dL — ABNORMAL HIGH (ref 70–99)

## 2021-01-19 LAB — POCT I-STAT EG7
Acid-Base Excess: 0 mmol/L (ref 0.0–2.0)
Bicarbonate: 26.4 mmol/L (ref 20.0–28.0)
Calcium, Ion: 1.14 mmol/L — ABNORMAL LOW (ref 1.15–1.40)
HCT: 31 % — ABNORMAL LOW (ref 39.0–52.0)
Hemoglobin: 10.5 g/dL — ABNORMAL LOW (ref 13.0–17.0)
O2 Saturation: 85 %
Patient temperature: 37
Potassium: 3.7 mmol/L (ref 3.5–5.1)
Sodium: 141 mmol/L (ref 135–145)
TCO2: 28 mmol/L (ref 22–32)
pCO2, Ven: 48.9 mmHg (ref 44.0–60.0)
pH, Ven: 7.34 (ref 7.250–7.430)
pO2, Ven: 54 mmHg — ABNORMAL HIGH (ref 32.0–45.0)

## 2021-01-19 LAB — HEMOGLOBIN AND HEMATOCRIT, BLOOD
HCT: 28.1 % — ABNORMAL LOW (ref 39.0–52.0)
Hemoglobin: 9.5 g/dL — ABNORMAL LOW (ref 13.0–17.0)

## 2021-01-19 LAB — PROTIME-INR
INR: 1.4 — ABNORMAL HIGH (ref 0.8–1.2)
Prothrombin Time: 16.7 seconds — ABNORMAL HIGH (ref 11.4–15.2)

## 2021-01-19 LAB — APTT: aPTT: 40 seconds — ABNORMAL HIGH (ref 24–36)

## 2021-01-19 LAB — HEMOGLOBIN A1C
Hgb A1c MFr Bld: 8.5 % — ABNORMAL HIGH (ref 4.8–5.6)
Mean Plasma Glucose: 197 mg/dL

## 2021-01-19 LAB — PLATELET COUNT: Platelets: 163 10*3/uL (ref 150–400)

## 2021-01-19 LAB — PHOSPHORUS: Phosphorus: 1.9 mg/dL — ABNORMAL LOW (ref 2.5–4.6)

## 2021-01-19 LAB — HEPARIN LEVEL (UNFRACTIONATED): Heparin Unfractionated: 0.59 IU/mL (ref 0.30–0.70)

## 2021-01-19 LAB — MAGNESIUM: Magnesium: 2.9 mg/dL — ABNORMAL HIGH (ref 1.7–2.4)

## 2021-01-19 SURGERY — CORONARY ARTERY BYPASS GRAFTING (CABG)
Anesthesia: General | Site: Chest | Laterality: Right

## 2021-01-19 MED ORDER — ROCURONIUM BROMIDE 10 MG/ML (PF) SYRINGE
PREFILLED_SYRINGE | INTRAVENOUS | Status: AC
  Filled 2021-01-19: qty 10

## 2021-01-19 MED ORDER — PLASMA-LYTE A IV SOLN
INTRAVENOUS | Status: DC | PRN
  Administered 2021-01-19: 1000 mL via INTRAVASCULAR

## 2021-01-19 MED ORDER — MIDAZOLAM HCL 5 MG/5ML IJ SOLN
INTRAMUSCULAR | Status: DC | PRN
  Administered 2021-01-19: 4 mg via INTRAVENOUS
  Administered 2021-01-19: 2 mg via INTRAVENOUS
  Administered 2021-01-19: 4 mg via INTRAVENOUS

## 2021-01-19 MED ORDER — ALBUMIN HUMAN 5 % IV SOLN: INTRAVENOUS | Status: DC | PRN

## 2021-01-19 MED ORDER — 0.9 % SODIUM CHLORIDE (POUR BTL) OPTIME
TOPICAL | Status: DC | PRN
  Administered 2021-01-19: 5000 mL

## 2021-01-19 MED ORDER — VANCOMYCIN HCL IN DEXTROSE 1-5 GM/200ML-% IV SOLN
1000.0000 mg | Freq: Once | INTRAVENOUS | Status: AC
  Administered 2021-01-19: 1000 mg via INTRAVENOUS
  Filled 2021-01-19: qty 200

## 2021-01-19 MED ORDER — INSULIN REGULAR(HUMAN) IN NACL 100-0.9 UT/100ML-% IV SOLN: INTRAVENOUS | Status: DC

## 2021-01-19 MED ORDER — ARTIFICIAL TEARS OPHTHALMIC OINT
TOPICAL_OINTMENT | OPHTHALMIC | Status: DC | PRN
  Administered 2021-01-19: 1 via OPHTHALMIC

## 2021-01-19 MED ORDER — METOPROLOL TARTRATE 12.5 MG HALF TABLET: 12.5000 mg | ORAL_TABLET | Freq: Two times a day (BID) | ORAL | Status: DC

## 2021-01-19 MED ORDER — LACTATED RINGERS IV SOLN: 500.0000 mL | Freq: Once | INTRAVENOUS | Status: DC | PRN

## 2021-01-19 MED ORDER — LACTATED RINGERS IV SOLN: INTRAVENOUS | Status: DC

## 2021-01-19 MED ORDER — PROTAMINE SULFATE 10 MG/ML IV SOLN
INTRAVENOUS | Status: AC
  Filled 2021-01-19: qty 10

## 2021-01-19 MED ORDER — CHLORHEXIDINE GLUCONATE 0.12 % MT SOLN
15.0000 mL | OROMUCOSAL | Status: AC
  Administered 2021-01-19: 15 mL via OROMUCOSAL
  Filled 2021-01-19: qty 15

## 2021-01-19 MED ORDER — SUCCINYLCHOLINE CHLORIDE 200 MG/10ML IV SOSY
PREFILLED_SYRINGE | INTRAVENOUS | Status: AC
  Filled 2021-01-19: qty 10

## 2021-01-19 MED ORDER — FENTANYL CITRATE (PF) 100 MCG/2ML IJ SOLN
INTRAMUSCULAR | Status: DC | PRN
  Administered 2021-01-19 (×2): 100 ug via INTRAVENOUS
  Administered 2021-01-19: 50 ug via INTRAVENOUS
  Administered 2021-01-19: 150 ug via INTRAVENOUS
  Administered 2021-01-19 (×2): 25 ug via INTRAVENOUS
  Administered 2021-01-19: 200 ug via INTRAVENOUS
  Administered 2021-01-19 (×2): 50 ug via INTRAVENOUS

## 2021-01-19 MED ORDER — OXYCODONE HCL 5 MG PO TABS
5.0000 mg | ORAL_TABLET | ORAL | Status: DC | PRN
  Administered 2021-01-20 – 2021-01-22 (×10): 10 mg via ORAL
  Filled 2021-01-19 (×10): qty 2

## 2021-01-19 MED ORDER — SODIUM CHLORIDE (PF) 0.9 % IJ SOLN
INTRAMUSCULAR | Status: AC
  Filled 2021-01-19: qty 10

## 2021-01-19 MED ORDER — ACETAMINOPHEN 650 MG RE SUPP
650.0000 mg | Freq: Once | RECTAL | Status: AC
  Administered 2021-01-19: 650 mg via RECTAL

## 2021-01-19 MED ORDER — ACETAMINOPHEN 160 MG/5ML PO SOLN: 650.0000 mg | Freq: Once | ORAL | Status: AC

## 2021-01-19 MED ORDER — LIDOCAINE 2% (20 MG/ML) 5 ML SYRINGE
INTRAMUSCULAR | Status: AC
  Filled 2021-01-19: qty 5

## 2021-01-19 MED ORDER — POTASSIUM CHLORIDE 10 MEQ/50ML IV SOLN
10.0000 meq | INTRAVENOUS | Status: AC
  Administered 2021-01-19 (×3): 10 meq via INTRAVENOUS

## 2021-01-19 MED ORDER — DEXTROSE 50 % IV SOLN: 0.0000 mL | INTRAVENOUS | Status: DC | PRN

## 2021-01-19 MED ORDER — TRANEXAMIC ACID 1000 MG/10ML IV SOLN
INTRAVENOUS | Status: DC | PRN
  Administered 2021-01-19: 1.5 mg/kg/h via INTRAVENOUS

## 2021-01-19 MED ORDER — MORPHINE SULFATE (PF) 2 MG/ML IV SOLN
1.0000 mg | INTRAVENOUS | Status: DC | PRN
  Administered 2021-01-19: 4 mg via INTRAVENOUS
  Administered 2021-01-20: 2 mg via INTRAVENOUS
  Filled 2021-01-19: qty 1
  Filled 2021-01-19: qty 2

## 2021-01-19 MED ORDER — LACTATED RINGERS IV SOLN: INTRAVENOUS | Status: DC | PRN

## 2021-01-19 MED ORDER — ARTIFICIAL TEARS OPHTHALMIC OINT
TOPICAL_OINTMENT | OPHTHALMIC | Status: AC
  Filled 2021-01-19: qty 3.5

## 2021-01-19 MED ORDER — ASPIRIN 81 MG PO CHEW
324.0000 mg | CHEWABLE_TABLET | Freq: Every day | ORAL | Status: DC
  Filled 2021-01-19: qty 4

## 2021-01-19 MED ORDER — MIDAZOLAM HCL (PF) 10 MG/2ML IJ SOLN
INTRAMUSCULAR | Status: AC
  Filled 2021-01-19: qty 2

## 2021-01-19 MED ORDER — PROPOFOL 10 MG/ML IV BOLUS
INTRAVENOUS | Status: AC
  Filled 2021-01-19: qty 20

## 2021-01-19 MED ORDER — METOPROLOL TARTRATE 25 MG/10 ML ORAL SUSPENSION: 12.5000 mg | Freq: Two times a day (BID) | ORAL | Status: DC

## 2021-01-19 MED ORDER — DOCUSATE SODIUM 100 MG PO CAPS
200.0000 mg | ORAL_CAPSULE | Freq: Every day | ORAL | Status: DC
  Administered 2021-01-20 – 2021-01-23 (×4): 200 mg via ORAL
  Filled 2021-01-19 (×4): qty 2

## 2021-01-19 MED ORDER — SODIUM CHLORIDE 0.45 % IV SOLN: INTRAVENOUS | Status: DC | PRN

## 2021-01-19 MED ORDER — METOPROLOL TARTRATE 5 MG/5ML IV SOLN: 2.5000 mg | INTRAVENOUS | Status: DC | PRN

## 2021-01-19 MED ORDER — PROTAMINE SULFATE 10 MG/ML IV SOLN
INTRAVENOUS | Status: DC | PRN
  Administered 2021-01-19: 310 mg via INTRAVENOUS

## 2021-01-19 MED ORDER — TRAMADOL HCL 50 MG PO TABS
50.0000 mg | ORAL_TABLET | ORAL | Status: DC | PRN
  Administered 2021-01-20 (×2): 100 mg via ORAL
  Administered 2021-01-20: 50 mg via ORAL
  Administered 2021-01-21: 100 mg via ORAL
  Administered 2021-01-23: 50 mg via ORAL
  Filled 2021-01-19 (×5): qty 2

## 2021-01-19 MED ORDER — SODIUM CHLORIDE 0.9% FLUSH
3.0000 mL | Freq: Two times a day (BID) | INTRAVENOUS | Status: DC
  Administered 2021-01-20 (×2): 3 mL via INTRAVENOUS

## 2021-01-19 MED ORDER — DEXMEDETOMIDINE HCL IN NACL 400 MCG/100ML IV SOLN
0.0000 ug/kg/h | INTRAVENOUS | Status: DC
  Filled 2021-01-19 (×2): qty 100

## 2021-01-19 MED ORDER — PROPOFOL 10 MG/ML IV BOLUS
INTRAVENOUS | Status: DC | PRN
  Administered 2021-01-19: 40 mg via INTRAVENOUS

## 2021-01-19 MED ORDER — MIDAZOLAM HCL 2 MG/2ML IJ SOLN
2.0000 mg | INTRAMUSCULAR | Status: DC | PRN
  Administered 2021-01-19 (×3): 2 mg via INTRAVENOUS
  Filled 2021-01-19 (×4): qty 2

## 2021-01-19 MED ORDER — SODIUM CHLORIDE 0.9% FLUSH: 3.0000 mL | INTRAVENOUS | Status: DC | PRN

## 2021-01-19 MED ORDER — FENTANYL CITRATE (PF) 250 MCG/5ML IJ SOLN
INTRAMUSCULAR | Status: AC
  Filled 2021-01-19: qty 25

## 2021-01-19 MED ORDER — BISACODYL 5 MG PO TBEC
10.0000 mg | DELAYED_RELEASE_TABLET | Freq: Every day | ORAL | Status: DC
  Administered 2021-01-20 – 2021-01-23 (×4): 10 mg via ORAL
  Filled 2021-01-19 (×4): qty 2

## 2021-01-19 MED ORDER — NITROGLYCERIN IN D5W 200-5 MCG/ML-% IV SOLN: 0.0000 ug/min | INTRAVENOUS | Status: DC

## 2021-01-19 MED ORDER — ACETAMINOPHEN 160 MG/5ML PO SOLN: 1000.0000 mg | Freq: Four times a day (QID) | ORAL | Status: DC

## 2021-01-19 MED ORDER — SODIUM CHLORIDE (PF) 0.9 % IJ SOLN
OROMUCOSAL | Status: DC | PRN
  Administered 2021-01-19 (×2): 4 mL via TOPICAL

## 2021-01-19 MED ORDER — ALBUMIN HUMAN 5 % IV SOLN
250.0000 mL | INTRAVENOUS | Status: AC | PRN
  Administered 2021-01-19 – 2021-01-20 (×3): 12.5 g via INTRAVENOUS
  Filled 2021-01-19: qty 250

## 2021-01-19 MED ORDER — PROTAMINE SULFATE 10 MG/ML IV SOLN
INTRAVENOUS | Status: AC
  Filled 2021-01-19: qty 25

## 2021-01-19 MED ORDER — ORAL CARE MOUTH RINSE
15.0000 mL | OROMUCOSAL | Status: DC
  Administered 2021-01-19 – 2021-01-20 (×7): 15 mL via OROMUCOSAL

## 2021-01-19 MED ORDER — PHENYLEPHRINE HCL-NACL 20-0.9 MG/250ML-% IV SOLN
INTRAVENOUS | Status: DC | PRN
  Administered 2021-01-19: 50 ug/min via INTRAVENOUS

## 2021-01-19 MED ORDER — ROCURONIUM BROMIDE 10 MG/ML (PF) SYRINGE
PREFILLED_SYRINGE | INTRAVENOUS | Status: DC | PRN
  Administered 2021-01-19: 20 mg via INTRAVENOUS
  Administered 2021-01-19: 30 mg via INTRAVENOUS
  Administered 2021-01-19: 100 mg via INTRAVENOUS
  Administered 2021-01-19: 50 mg via INTRAVENOUS

## 2021-01-19 MED ORDER — LIDOCAINE 2% (20 MG/ML) 5 ML SYRINGE
INTRAMUSCULAR | Status: DC | PRN
  Administered 2021-01-19: 40 mg via INTRAVENOUS

## 2021-01-19 MED ORDER — FAMOTIDINE IN NACL 20-0.9 MG/50ML-% IV SOLN
20.0000 mg | Freq: Two times a day (BID) | INTRAVENOUS | Status: AC
  Administered 2021-01-19 (×2): 20 mg via INTRAVENOUS
  Filled 2021-01-19 (×2): qty 50

## 2021-01-19 MED ORDER — SODIUM CHLORIDE 0.9 % IV SOLN: 250.0000 mL | INTRAVENOUS | Status: DC

## 2021-01-19 MED ORDER — SODIUM CHLORIDE 0.9% FLUSH: 10.0000 mL | INTRAVENOUS | Status: DC | PRN

## 2021-01-19 MED ORDER — CEFAZOLIN SODIUM-DEXTROSE 2-4 GM/100ML-% IV SOLN
2.0000 g | Freq: Three times a day (TID) | INTRAVENOUS | Status: AC
  Administered 2021-01-19 – 2021-01-21 (×6): 2 g via INTRAVENOUS
  Filled 2021-01-19 (×7): qty 100

## 2021-01-19 MED ORDER — ONDANSETRON HCL 4 MG/2ML IJ SOLN
4.0000 mg | Freq: Four times a day (QID) | INTRAMUSCULAR | Status: DC | PRN
  Administered 2021-01-20: 4 mg via INTRAVENOUS
  Filled 2021-01-19: qty 2

## 2021-01-19 MED ORDER — NOREPINEPHRINE 4 MG/250ML-% IV SOLN
0.0000 ug/min | INTRAVENOUS | Status: DC
  Administered 2021-01-19: 5 ug/min via INTRAVENOUS
  Filled 2021-01-19: qty 250

## 2021-01-19 MED ORDER — ASPIRIN EC 325 MG PO TBEC
325.0000 mg | DELAYED_RELEASE_TABLET | Freq: Every day | ORAL | Status: DC
  Administered 2021-01-20 – 2021-01-23 (×4): 325 mg via ORAL
  Filled 2021-01-19 (×4): qty 1

## 2021-01-19 MED ORDER — ACETAMINOPHEN 500 MG PO TABS
1000.0000 mg | ORAL_TABLET | Freq: Four times a day (QID) | ORAL | Status: DC
  Administered 2021-01-20 – 2021-01-23 (×13): 1000 mg via ORAL
  Filled 2021-01-19 (×13): qty 2

## 2021-01-19 MED ORDER — CHLORHEXIDINE GLUCONATE CLOTH 2 % EX PADS
6.0000 | MEDICATED_PAD | Freq: Every day | CUTANEOUS | Status: DC
  Administered 2021-01-19 – 2021-01-21 (×3): 6 via TOPICAL

## 2021-01-19 MED ORDER — PANTOPRAZOLE SODIUM 40 MG PO TBEC
40.0000 mg | DELAYED_RELEASE_TABLET | Freq: Every day | ORAL | Status: DC
  Administered 2021-01-21 – 2021-01-23 (×3): 40 mg via ORAL
  Filled 2021-01-19 (×3): qty 1

## 2021-01-19 MED ORDER — NICARDIPINE HCL IN NACL 20-0.86 MG/200ML-% IV SOLN
0.0000 mg/h | INTRAVENOUS | Status: DC
Start: 2021-01-19 — End: 2021-01-20
  Administered 2021-01-19 (×2): 5 mg/h via INTRAVENOUS
  Filled 2021-01-19: qty 200

## 2021-01-19 MED ORDER — DOBUTAMINE IN D5W 4-5 MG/ML-% IV SOLN
0.0000 ug/kg/min | INTRAVENOUS | Status: DC
  Filled 2021-01-19: qty 250

## 2021-01-19 MED ORDER — MAGNESIUM SULFATE 4 GM/100ML IV SOLN
4.0000 g | Freq: Once | INTRAVENOUS | Status: AC
  Administered 2021-01-19: 4 g via INTRAVENOUS
  Filled 2021-01-19: qty 100

## 2021-01-19 MED ORDER — DEXMEDETOMIDINE HCL IN NACL 400 MCG/100ML IV SOLN
INTRAVENOUS | Status: DC | PRN
  Administered 2021-01-19: .4 ug/kg/h via INTRAVENOUS

## 2021-01-19 MED ORDER — HEPARIN SODIUM (PORCINE) 1000 UNIT/ML IJ SOLN
INTRAMUSCULAR | Status: DC | PRN
  Administered 2021-01-19: 31000 [IU] via INTRAVENOUS

## 2021-01-19 MED ORDER — SODIUM CHLORIDE 0.9 % IV SOLN: INTRAVENOUS | Status: DC

## 2021-01-19 MED ORDER — DOBUTAMINE IN D5W 4-5 MG/ML-% IV SOLN
2.5000 ug/kg/min | INTRAVENOUS | Status: DC
Start: 2021-01-19 — End: 2021-01-20
  Administered 2021-01-19: 2.5 ug/kg/min via INTRAVENOUS

## 2021-01-19 MED ORDER — BISACODYL 10 MG RE SUPP: 10.0000 mg | Freq: Every day | RECTAL | Status: DC

## 2021-01-19 MED ORDER — HEPARIN SODIUM (PORCINE) 1000 UNIT/ML IJ SOLN
INTRAMUSCULAR | Status: AC
  Filled 2021-01-19: qty 1

## 2021-01-19 SURGICAL SUPPLY — 89 items
BAG DECANTER FOR FLEXI CONT (MISCELLANEOUS) ×5 IMPLANT
BLADE CLIPPER SURG (BLADE) ×5 IMPLANT
BLADE STERNUM SYSTEM 6 (BLADE) ×5 IMPLANT
BLADE SURG 11 STRL SS (BLADE) ×5 IMPLANT
BNDG ELASTIC 4X5.8 VLCR STR LF (GAUZE/BANDAGES/DRESSINGS) ×5 IMPLANT
BNDG ELASTIC 6X5.8 VLCR STR LF (GAUZE/BANDAGES/DRESSINGS) ×5 IMPLANT
BNDG GAUZE ELAST 4 BULKY (GAUZE/BANDAGES/DRESSINGS) ×5 IMPLANT
CABLE SURGICAL S-101-97-12 (CABLE) IMPLANT
CANISTER SUCT 3000ML PPV (MISCELLANEOUS) ×5 IMPLANT
CANNULA MC2 2 STG 29/37 NON-V (CANNULA) ×4 IMPLANT
CANNULA MC2 TWO STAGE (CANNULA) ×1
CANNULA NON VENT 20FR 12 (CANNULA) ×5 IMPLANT
CATH ROBINSON RED A/P 18FR (CATHETERS) ×10 IMPLANT
CLIP FOGARTY SPRING 6M (CLIP) ×5 IMPLANT
CLIP RETRACTION 3.0MM CORONARY (MISCELLANEOUS) IMPLANT
CLIP VESOCCLUDE MED 24/CT (CLIP) IMPLANT
CLIP VESOCCLUDE SM WIDE 24/CT (CLIP) IMPLANT
CONN ST 1/2X1/2  BEN (MISCELLANEOUS) ×1
CONN ST 1/2X1/2 BEN (MISCELLANEOUS) ×4 IMPLANT
CONNECTOR BLAKE 2:1 CARIO BLK (MISCELLANEOUS) ×5 IMPLANT
CONTAINER PROTECT SURGISLUSH (MISCELLANEOUS) ×10 IMPLANT
DEFOGGER ANTIFOG KIT (MISCELLANEOUS) ×5 IMPLANT
DERMABOND ADVANCED (GAUZE/BANDAGES/DRESSINGS) ×1
DERMABOND ADVANCED .7 DNX12 (GAUZE/BANDAGES/DRESSINGS) ×4 IMPLANT
DRAIN CHANNEL 19F RND (DRAIN) ×10 IMPLANT
DRAIN CONNECTOR BLAKE 1:1 (MISCELLANEOUS) ×5 IMPLANT
DRAPE CARDIOVASCULAR INCISE (DRAPES) ×1
DRAPE INCISE IOBAN 66X45 STRL (DRAPES) IMPLANT
DRAPE SRG 135X102X78XABS (DRAPES) ×4 IMPLANT
DRAPE WARM FLUID 44X44 (DRAPES) ×5 IMPLANT
DRSG AQUACEL AG ADV 3.5X10 (GAUZE/BANDAGES/DRESSINGS) ×5 IMPLANT
DRSG COVADERM 4X14 (GAUZE/BANDAGES/DRESSINGS) ×5 IMPLANT
ELECT BLADE 4.0 EZ CLEAN MEGAD (MISCELLANEOUS) ×5
ELECT REM PT RETURN 9FT ADLT (ELECTROSURGICAL) ×10
ELECTRODE BLDE 4.0 EZ CLN MEGD (MISCELLANEOUS) ×4 IMPLANT
ELECTRODE REM PT RTRN 9FT ADLT (ELECTROSURGICAL) ×8 IMPLANT
FELT TEFLON 1X6 (MISCELLANEOUS) ×5 IMPLANT
GAUZE 4X4 16PLY ~~LOC~~+RFID DBL (SPONGE) ×5 IMPLANT
GAUZE SPONGE 4X4 12PLY STRL (GAUZE/BANDAGES/DRESSINGS) ×10 IMPLANT
GLOVE SURG ENC MOIS LTX SZ7 (GLOVE) ×10 IMPLANT
GLOVE SURG ENC TEXT LTX SZ7.5 (GLOVE) ×10 IMPLANT
GOWN STRL REUS W/ TWL LRG LVL3 (GOWN DISPOSABLE) ×16 IMPLANT
GOWN STRL REUS W/ TWL XL LVL3 (GOWN DISPOSABLE) ×8 IMPLANT
GOWN STRL REUS W/TWL LRG LVL3 (GOWN DISPOSABLE) ×4
GOWN STRL REUS W/TWL XL LVL3 (GOWN DISPOSABLE) ×2
HEMOSTAT POWDER SURGIFOAM 1G (HEMOSTASIS) ×10 IMPLANT
INSERT SUTURE HOLDER (MISCELLANEOUS) ×5 IMPLANT
K-WIRE .035X5.75  STT K (WIRE) ×5 IMPLANT
KIT BASIN OR (CUSTOM PROCEDURE TRAY) ×5 IMPLANT
KIT SUCTION CATH 14FR (SUCTIONS) ×5 IMPLANT
KIT TURNOVER KIT B (KITS) ×5 IMPLANT
KIT VASOVIEW HEMOPRO 2 VH 4000 (KITS) ×5 IMPLANT
LEAD PACING MYOCARDI (MISCELLANEOUS) ×5 IMPLANT
MARKER GRAFT CORONARY BYPASS (MISCELLANEOUS) ×15 IMPLANT
NS IRRIG 1000ML POUR BTL (IV SOLUTION) ×25 IMPLANT
PACK ACCESSORY CANNULA KIT (KITS) ×5 IMPLANT
PACK E OPEN HEART (SUTURE) ×5 IMPLANT
PACK OPEN HEART (CUSTOM PROCEDURE TRAY) ×5 IMPLANT
PAD ARMBOARD 7.5X6 YLW CONV (MISCELLANEOUS) ×10 IMPLANT
PAD ELECT DEFIB RADIOL ZOLL (MISCELLANEOUS) ×5 IMPLANT
PENCIL BUTTON HOLSTER BLD 10FT (ELECTRODE) ×5 IMPLANT
POSITIONER HEAD DONUT 9IN (MISCELLANEOUS) ×5 IMPLANT
PUNCH AORTIC ROTATE 4.0MM (MISCELLANEOUS) ×5 IMPLANT
SET MPS 3-ND DEL (MISCELLANEOUS) ×5 IMPLANT
SPONGE T-LAP 18X18 ~~LOC~~+RFID (SPONGE) ×20 IMPLANT
SPONGE T-LAP 4X18 ~~LOC~~+RFID (SPONGE) ×5 IMPLANT
SUPPORT HEART JANKE-BARRON (MISCELLANEOUS) ×5 IMPLANT
SUT BONE WAX W31G (SUTURE) ×5 IMPLANT
SUT ETHIBOND X763 2 0 SH 1 (SUTURE) ×10 IMPLANT
SUT MNCRL AB 3-0 PS2 18 (SUTURE) ×10 IMPLANT
SUT PDS AB 1 CTX 36 (SUTURE) ×10 IMPLANT
SUT PROLENE 4 0 SH DA (SUTURE) ×5 IMPLANT
SUT PROLENE 5 0 C 1 36 (SUTURE) ×20 IMPLANT
SUT PROLENE 7 0 BV1 MDA (SUTURE) ×10 IMPLANT
SUT STEEL 6MS V (SUTURE) ×10 IMPLANT
SUT VIC AB 1 CTX 36 (SUTURE) ×1
SUT VIC AB 1 CTX36XBRD ANBCTR (SUTURE) ×4 IMPLANT
SUT VIC AB 2-0 CT1 27 (SUTURE) ×1
SUT VIC AB 2-0 CT1 TAPERPNT 27 (SUTURE) ×4 IMPLANT
SUT VICRYL 3-0 27 CRC X-1 (SUTURE) ×5 IMPLANT
SYSTEM SAHARA CHEST DRAIN ATS (WOUND CARE) ×5 IMPLANT
TAPE CLOTH SURG 4X10 WHT LF (GAUZE/BANDAGES/DRESSINGS) ×5 IMPLANT
TAPE PAPER 2X10 WHT MICROPORE (GAUZE/BANDAGES/DRESSINGS) ×5 IMPLANT
TOWEL GREEN STERILE (TOWEL DISPOSABLE) ×5 IMPLANT
TOWEL GREEN STERILE FF (TOWEL DISPOSABLE) ×5 IMPLANT
TRAY FOLEY SLVR 16FR TEMP STAT (SET/KITS/TRAYS/PACK) ×5 IMPLANT
TUBING LAP HI FLOW INSUFFLATIO (TUBING) ×5 IMPLANT
UNDERPAD 30X36 HEAVY ABSORB (UNDERPADS AND DIAPERS) ×5 IMPLANT
WATER STERILE IRR 1000ML POUR (IV SOLUTION) ×10 IMPLANT

## 2021-01-19 NOTE — Brief Op Note (Signed)
01/17/2021 - 01/19/2021  2:20 PM  PATIENT:  Gerarda Gunther Heater  75 y.o. male  PRE-OPERATIVE DIAGNOSIS:  Coronary Artery Disease  POST-OPERATIVE DIAGNOSIS:  Coronary Artery Disease  PROCEDURE:  Procedure(s) with comments: CORONARY ARTERY BYPASS GRAFTING (CABG) TIMES FOUR, ON PUMP, USING LEFT INTERNAL MAMMARY ARTERY AND ENDOSCOPICALLY HARVESTED GREATER SAPHENOUS VEIN (N/A) - flow trac TRANSESOPHAGEAL ECHOCARDIOGRAM (TEE) (N/A) APPLICATION OF CELL SAVER ENDOVEIN HARVEST OF GREATER SAPHENOUS VEIN (Right) LIMA-LAD SVG-DIAG SVG-OM SVG-PD EVH 60 MINUTES  SURGEON:  Surgeon(s) and Role:    * [name], Lucile Crater, MD - Primary  PHYSICIAN ASSISTANT: Tina Temme PA-C  ASSISTANTS: STAFF RNFA   ANESTHESIA:   general  EBL:  530 mL   BLOOD ADMINISTERED:none  DRAINS:  LEFT PLEURAL AND MEDIASINAL CHEST TUBES    LOCAL MEDICATIONS USED:  NONE  SPECIMEN:  No Specimen  DISPOSITION OF SPECIMEN:  N/A  COUNTS:  YES  TOURNIQUET:  * No tourniquets in log *  DICTATION: .Dragon Dictation  PLAN OF CARE: Admit to inpatient   PATIENT DISPOSITION:  ICU - intubated and hemodynamically stable.   Delay start of Pharmacological VTE agent (>24hrs) due to surgical blood loss or risk of bleeding: yes  COMPLICATIONS: NO KNOWN

## 2021-01-19 NOTE — Anesthesia Postprocedure Evaluation (Signed)
Anesthesia Post Note  Patient: Shane Ford  Procedure(s) Performed: CORONARY ARTERY BYPASS GRAFTING (CABG) TIMES FOUR, ON PUMP, USING LEFT INTERNAL MAMMARY ARTERY AND ENDOSCOPICALLY HARVESTED GREATER SAPHENOUS VEIN (Chest) TRANSESOPHAGEAL ECHOCARDIOGRAM (TEE) APPLICATION OF CELL SAVER ENDOVEIN HARVEST OF GREATER SAPHENOUS VEIN (Right)     Patient location during evaluation: ICU Anesthesia Type: General Level of consciousness: sedated and patient remains intubated per anesthesia plan Pain management: pain level controlled Vital Signs Assessment: post-procedure vital signs reviewed and stable Respiratory status: patient remains intubated per anesthesia plan Cardiovascular status: stable Postop Assessment: no apparent nausea or vomiting Anesthetic complications: no   No notable events documented.  Last Vitals:  Vitals:   01/19/21 0600 01/19/21 1258  BP: 125/75   Pulse: (!) 59   Resp: 16   Temp:    SpO2: 96% 100%    Last Pain:  Vitals:   01/19/21 0400  TempSrc:   PainSc: Shane Ford

## 2021-01-19 NOTE — Progress Notes (Signed)
RT NOTE: Per Dr. Kipp Brood, wean patient straight to PS 10/CPAP 5. Due to copious oral secretions can bypass the 40%, RR 4 protocol. Vitals are stable. RT will continue to monitor.

## 2021-01-19 NOTE — Anesthesia Procedure Notes (Signed)
Central Venous Catheter Insertion Performed by: Audry Pili, MD, anesthesiologist Start/End7/26/2022 7:09 AM, 01/19/2021 7:18 AM Patient location: Pre-op. Preanesthetic checklist: patient identified, IV checked, risks and benefits discussed, surgical consent, monitors and equipment checked, pre-op evaluation, timeout performed and anesthesia consent Position: Trendelenburg Lidocaine 1% used for infiltration and patient sedated Hand hygiene performed , maximum sterile barriers used  and Seldinger technique used Catheter size: 8.5 Fr Central line was placed.Sheath introducer Procedure performed using ultrasound guided technique. Ultrasound Notes:anatomy identified, needle tip was noted to be adjacent to the nerve/plexus identified, no ultrasound evidence of intravascular and/or intraneural injection and image(s) printed for medical record Attempts: 1 Following insertion, line sutured, dressing applied and Biopatch. Post procedure assessment: blood return through all ports, free fluid flow and no air  Patient tolerated the procedure well with no immediate complications. Additional procedure comments: Triple Lumen SLIC placed via port .

## 2021-01-19 NOTE — Procedures (Signed)
Extubation Procedure Note  Patient Details:   Name: Shane Ford [facility] DOB: 1945/11/01 MRN: [id number]   Airway Documentation:    Vent end date: 01/19/21 Vent end time: 1810   Evaluation  O2 sats: stable throughout Complications: No apparent complications Patient did tolerate procedure well. Bilateral Breath Sounds: Clear, Diminished   Yes  Patient extubated per protocol to 4L [place] with no apparent complications. Positive cuff leak was noted prior to extubation. Patient achieved NIF of -26 and VC of .82L with moderate patient effort. Patient is able to weakly speak and has a strong cough. Vitals are stable. RT will continue to monitor.   [name] [name] 01/19/2021, 6:32 PM

## 2021-01-19 NOTE — Anesthesia Procedure Notes (Signed)
Procedure Name: Intubation Date/Time: 01/19/2021 8:42 AM Performed by: Kathryne Hitch, CRNA Pre-anesthesia Checklist: Patient identified, Emergency Drugs available, Suction available and Patient being monitored Patient Re-evaluated:Patient Re-evaluated prior to induction Oxygen Delivery Method: Circle system utilized Preoxygenation: Pre-oxygenation with 100% oxygen Induction Type: IV induction Ventilation: Mask ventilation without difficulty Laryngoscope Size: Miller and 3 Grade View: Grade I Tube type: Oral Number of attempts: 1 Airway Equipment and Method: Stylet and Oral airway Placement Confirmation: ETT inserted through vocal cords under direct vision, positive ETCO2 and breath sounds checked- equal and bilateral Secured at: 25 cm Tube secured with: Tape Dental Injury: Teeth and Oropharynx as per pre-operative assessment

## 2021-01-19 NOTE — Transfer of Care (Signed)
Immediate Anesthesia Transfer of Care Note  Patient: Shane Ford [facility]  Procedure(s) Performed: CORONARY ARTERY BYPASS GRAFTING (CABG) TIMES FOUR, ON PUMP, USING LEFT INTERNAL MAMMARY ARTERY AND ENDOSCOPICALLY HARVESTED GREATER SAPHENOUS VEIN (Chest) TRANSESOPHAGEAL ECHOCARDIOGRAM (TEE) APPLICATION OF CELL SAVER ENDOVEIN HARVEST OF GREATER SAPHENOUS VEIN (Right)  Patient Location: ICU  Anesthesia Type:General  Level of Consciousness: Patient remains intubated per anesthesia plan  Airway & Oxygen Therapy: Patient remains intubated per anesthesia plan and Patient placed on Ventilator (see vital sign flow sheet for setting)  Post-op Assessment: Report given to RN and Post -op Vital signs reviewed and stable  Post vital signs: Reviewed and stable  Last Vitals:  Vitals Value Taken Time  BP 142/71   Temp    Pulse 80 01/19/21 1305  Resp 15 01/19/21 1305  SpO2 100 % 01/19/21 1305  Vitals shown include unvalidated device data.  Last Pain:  Vitals:   01/19/21 0400  TempSrc:   PainSc: Asleep      Patients Stated Pain Goal: 0 ([id number])  Complications: No notable events documented.

## 2021-01-19 NOTE — Op Note (Signed)
[address]       ,[place] [address]             [phone]                                          01/19/2021 Patient:  Shane Ford [facility] Pre-Op Dx: Left main/three-vessel coronary artery disease.   NSTEMI   Diabetes mellitus   Hypertension Post-op Dx: Same Procedure: CABG X 4, LIMA LAD, reverse saphenous vein graft to posterior lateral branch, OM1, first diagonal. Endoscopic greater saphenous vein harvest on the right   Surgeon and Role:      * Voris Tigert, Lucile Crater, MD - Primary    Evonnie Pat, PA-C- assisting  Anesthesia  general EBL: 500 ml Blood Administration: None Xclamp Time: 64 min Pump Time: 116 min  Drains: 25F blake drain: L, mediastinal  Wires: Ventricular Counts: correct   Indications: This is a 75 year old gentleman is admitted with left main/three-vessel coronary artery disease.  He originally presented with anginal symptoms and was ruled in for STEMI.  He does have a history of coronary artery disease and has had stent placement in the right systems in the past.  Review of his images he does have good distal targets in the LAD, and circumflex distribution.  The stent on the right coronary appears patent but he does have some distal disease in the PDA and posterolateral branch.  His echocardiogram shows preserved RV and LV function and no significant valvular disease.  We will review of his EKG, he does have a new second-degree block.  We discussed the risks and benefits of surgical revascularization and he is agreeable to proceed.  He is scheduled for 01/19/2021.  Findings: Good LIMA, good vein conduit.  Heavily calcified LAD.  We implanted the LIMA in the midportion to cover the largest territory.  The apex had a tight stenosis that we were unable to probe, but this was a very small distribution.  The PLV was a larger vessel compared to the PDA.  OM1 was a good quality vessel.  First diagonal was also good quality vessel.  Operative  Technique: All invasive lines were placed in pre-op holding.  After the risks, benefits and alternatives were thoroughly discussed, the patient was brought to the operative theatre.  Anesthesia was induced, and the patient was prepped and draped in normal sterile fashion.  An appropriate surgical pause was performed, and pre-operative antibiotics were dosed accordingly.  We began with simultaneous incisions along the right leg for harvesting of the greater saphenous vein and the chest for the sternotomy.  In regards to the sternotomy, this was carried down with bovie cautery, and the sternum was divided with a reciprocating saw.  Meticulous hemostasis was obtained.  The left internal thoracic artery was exposed and harvested in in pedicled fashion.  The patient was systemically heparinized, and the artery was divided distally, and placed in a papaverine sponge.    The sternal elevator was removed, and a retractor was placed.  The pericardium was divided in the midline and fashioned into a cradle with pericardial stitches.   After we confirmed an appropriate ACT, the ascending aorta was cannulated in standard fashion.  The right atrial appendage was used for venous cannulation site.  Cardiopulmonary bypass was initiated, and the heart retractor was placed. The cross clamp was applied, and a  dose of anterograde cardioplegia was given with good arrest of the heart.  We moved to the posterior wall of the heart, and found a good target on the posterior lateral branch.  An arteriotomy was made, and the vein graft was anastomosed to it in an end to side fashion.  Next we exposed the lateral wall, and found a good target on the OM1.  An end to side anastomosis with the vein graft was then created.  Next, we exposed the anterior wall of the heart and identified a good target on first diagonal.   An arteriotomy was created.  The vein was anastomosed in an end to side fashion.  Finally, we exposed a good target on the   LAD, and fashioned an end to side anastomosis between it and the LITA.  We began to re-warm, and a re-animation dose of cardioplegia was given.  The heart was de-aired, and the cross clamp was removed.  Meticulous hemostasis was obtained.    A partial occludding clamp was then placed on the ascending aorta, and we created an end to side anastomosis between it and the proximal vein grafts.  The proximal sites were marked with rings.  Hemostasis was obtained, and we separated from cardiopulmonary bypass without event.  The heparin was reversed with protamine.  Chest tubes and wires were placed, and the sternum was re-approximated with sternal wires.  The soft tissue and skin were re-approximated wth absorbable suture.    The patient tolerated the procedure without any immediate complications, and was transferred to the ICU in guarded condition.  Emett Ford Shane Ford

## 2021-01-19 NOTE — Anesthesia Procedure Notes (Signed)
Central Venous Catheter Insertion Performed by: Audry Pili, MD, anesthesiologist Start/End7/26/2022 7:17 AM, 01/19/2021 7:18 AM Patient location: Pre-op. Preanesthetic checklist: patient identified, IV checked, risks and benefits discussed, surgical consent, monitors and equipment checked, pre-op evaluation, timeout performed and anesthesia consent Position: Trendelenburg Hand hygiene performed  and maximum sterile barriers used  Central line was placed.Triple lumen Procedure performed without using ultrasound guided technique. Patient tolerated the procedure well with no immediate complications. Additional procedure comments: Triple Lumen SLIC placed via CVC port.

## 2021-01-19 NOTE — Anesthesia Procedure Notes (Signed)
Arterial Line Insertion Performed by: Kathryne Hitch, CRNA, CRNA  Patient location: Pre-op. Preanesthetic checklist: patient identified, IV checked, site marked, risks and benefits discussed, surgical consent, monitors and equipment checked, pre-op evaluation, timeout performed and anesthesia consent Lidocaine 1% used for infiltration Left, radial was placed Catheter size: 20 Fr Hand hygiene performed  and maximum sterile barriers used   Attempts: 1 Procedure performed without using ultrasound guided technique. Following insertion, dressing applied. Post procedure assessment: normal and unchanged  Patient tolerated the procedure well with no immediate complications.

## 2021-01-19 NOTE — Progress Notes (Signed)
  Echocardiogram Echocardiogram Transesophageal has been performed.  Shane Ford 01/19/2021, 8:22 AM

## 2021-01-19 NOTE — Progress Notes (Signed)
     [address]       [place] [address]             [phone]       No events   Vitals:   01/19/21 0530 01/19/21 0600  BP: 124/78 125/75  Pulse: 91 (!) 59  Resp: 19 16  Temp:    SpO2: 95% 96%    Alert NAD AV block EWOB  OR today for CABG [place]

## 2021-01-20 ENCOUNTER — Encounter (HOSPITAL_COMMUNITY): Payer: Self-pay | Admitting: Thoracic Surgery (Cardiothoracic Vascular Surgery)

## 2021-01-20 ENCOUNTER — Inpatient Hospital Stay (HOSPITAL_COMMUNITY): Payer: Medicare HMO

## 2021-01-20 DIAGNOSIS — I2111 ST elevation (STEMI) myocardial infarction involving right coronary artery: Secondary | ICD-10-CM | POA: Diagnosis not present

## 2021-01-20 DIAGNOSIS — Z951 Presence of aortocoronary bypass graft: Secondary | ICD-10-CM

## 2021-01-20 DIAGNOSIS — I441 Atrioventricular block, second degree: Secondary | ICD-10-CM | POA: Diagnosis not present

## 2021-01-20 DIAGNOSIS — I2510 Atherosclerotic heart disease of native coronary artery without angina pectoris: Secondary | ICD-10-CM | POA: Diagnosis not present

## 2021-01-20 LAB — CBC
HCT: 30.6 % — ABNORMAL LOW (ref 39.0–52.0)
HCT: 33.2 % — ABNORMAL LOW (ref 39.0–52.0)
Hemoglobin: 10.2 g/dL — ABNORMAL LOW (ref 13.0–17.0)
Hemoglobin: 11.1 g/dL — ABNORMAL LOW (ref 13.0–17.0)
MCH: 30.3 pg (ref 26.0–34.0)
MCH: 30.7 pg (ref 26.0–34.0)
MCHC: 33.3 g/dL (ref 30.0–36.0)
MCHC: 33.4 g/dL (ref 30.0–36.0)
MCV: 90.8 fL (ref 80.0–100.0)
MCV: 91.7 fL (ref 80.0–100.0)
Platelets: 145 10*3/uL — ABNORMAL LOW (ref 150–400)
Platelets: 167 10*3/uL (ref 150–400)
RBC: 3.37 MIL/uL — ABNORMAL LOW (ref 4.22–5.81)
RBC: 3.62 MIL/uL — ABNORMAL LOW (ref 4.22–5.81)
RDW: 13.2 % (ref 11.5–15.5)
RDW: 13.5 % (ref 11.5–15.5)
WBC: 7.7 10*3/uL (ref 4.0–10.5)
WBC: 9.3 10*3/uL (ref 4.0–10.5)
nRBC: 0 % (ref 0.0–0.2)
nRBC: 0 % (ref 0.0–0.2)

## 2021-01-20 LAB — BASIC METABOLIC PANEL
Anion gap: 7 (ref 5–15)
Anion gap: 9 (ref 5–15)
BUN: 10 mg/dL (ref 8–23)
BUN: 12 mg/dL (ref 8–23)
CO2: 23 mmol/L (ref 22–32)
CO2: 22 mmol/L (ref 22–32)
Calcium: 8.2 mg/dL — ABNORMAL LOW (ref 8.9–10.3)
Calcium: 8.5 mg/dL — ABNORMAL LOW (ref 8.9–10.3)
Chloride: 107 mmol/L (ref 98–111)
Chloride: 104 mmol/L (ref 98–111)
Creatinine, Ser: 1 mg/dL (ref 0.61–1.24)
Creatinine, Ser: 1.13 mg/dL (ref 0.61–1.24)
GFR, Estimated: >60 mL/min (ref >60)
GFR, Estimated: >60 mL/min (ref >60)
Glucose, Bld: 123 mg/dL — ABNORMAL HIGH (ref 70–99)
Glucose, Bld: 283 mg/dL — ABNORMAL HIGH (ref 70–99)
Potassium: 3.7 mmol/L (ref 3.5–5.1)
Potassium: 4 mmol/L (ref 3.5–5.1)
Sodium: 137 mmol/L (ref 135–145)
Sodium: 135 mmol/L (ref 135–145)

## 2021-01-20 LAB — POCT I-STAT 7, (LYTES, BLD GAS, ICA,H+H)
Acid-base deficit: 3 mmol/L — ABNORMAL HIGH (ref 0.0–2.0)
Acid-base deficit: 3 mmol/L — ABNORMAL HIGH (ref 0.0–2.0)
Bicarbonate: 22.7 mmol/L (ref 20.0–28.0)
Bicarbonate: 21.9 mmol/L (ref 20.0–28.0)
Calcium, Ion: 1.27 mmol/L (ref 1.15–1.40)
Calcium, Ion: 1.19 mmol/L (ref 1.15–1.40)
HCT: 28 % — ABNORMAL LOW (ref 39.0–52.0)
HCT: 29 % — ABNORMAL LOW (ref 39.0–52.0)
Hemoglobin: 9.5 g/dL — ABNORMAL LOW (ref 13.0–17.0)
Hemoglobin: 9.9 g/dL — ABNORMAL LOW (ref 13.0–17.0)
O2 Saturation: 99 %
O2 Saturation: 98 %
Patient temperature: 36.9
Patient temperature: 37.7
Potassium: 3.8 mmol/L (ref 3.5–5.1)
Potassium: 4.2 mmol/L (ref 3.5–5.1)
Sodium: 140 mmol/L (ref 135–145)
Sodium: 142 mmol/L (ref 135–145)
TCO2: 24 mmol/L (ref 22–32)
TCO2: 23 mmol/L (ref 22–32)
pCO2 arterial: 42.6 mmHg (ref 32.0–48.0)
pCO2 arterial: 38.2 mmHg (ref 32.0–48.0)
pH, Arterial: 7.335 — ABNORMAL LOW (ref 7.350–7.450)
pH, Arterial: 7.369 (ref 7.350–7.450)
pO2, Arterial: 177 mmHg — ABNORMAL HIGH (ref 83.0–108.0)
pO2, Arterial: 117 mmHg — ABNORMAL HIGH (ref 83.0–108.0)

## 2021-01-20 LAB — GLUCOSE, CAPILLARY
Glucose-Capillary: 114 mg/dL — ABNORMAL HIGH (ref 70–99)
Glucose-Capillary: 121 mg/dL — ABNORMAL HIGH (ref 70–99)
Glucose-Capillary: 122 mg/dL — ABNORMAL HIGH (ref 70–99)
Glucose-Capillary: 133 mg/dL — ABNORMAL HIGH (ref 70–99)
Glucose-Capillary: 134 mg/dL — ABNORMAL HIGH (ref 70–99)
Glucose-Capillary: 137 mg/dL — ABNORMAL HIGH (ref 70–99)
Glucose-Capillary: 151 mg/dL — ABNORMAL HIGH (ref 70–99)
Glucose-Capillary: 236 mg/dL — ABNORMAL HIGH (ref 70–99)
Glucose-Capillary: 253 mg/dL — ABNORMAL HIGH (ref 70–99)
Glucose-Capillary: 277 mg/dL — ABNORMAL HIGH (ref 70–99)
Glucose-Capillary: 95 mg/dL (ref 70–99)

## 2021-01-20 LAB — MAGNESIUM
Magnesium: 2.5 mg/dL — ABNORMAL HIGH (ref 1.7–2.4)
Magnesium: 2.2 mg/dL (ref 1.7–2.4)

## 2021-01-20 MED ORDER — FUROSEMIDE 10 MG/ML IJ SOLN
40.0000 mg | Freq: Once | INTRAMUSCULAR | Status: AC
  Administered 2021-01-20: 40 mg via INTRAVENOUS
  Filled 2021-01-20: qty 4

## 2021-01-20 MED ORDER — POTASSIUM CHLORIDE CRYS ER 20 MEQ PO TBCR
20.0000 meq | EXTENDED_RELEASE_TABLET | ORAL | Status: AC
  Administered 2021-01-20 (×3): 20 meq via ORAL
  Filled 2021-01-20 (×3): qty 1

## 2021-01-20 MED ORDER — ENOXAPARIN SODIUM 30 MG/0.3ML IJ SOSY
30.0000 mg | PREFILLED_SYRINGE | Freq: Every day | INTRAMUSCULAR | Status: DC
  Administered 2021-01-20 – 2021-01-22 (×3): 30 mg via SUBCUTANEOUS
  Filled 2021-01-20 (×3): qty 0.3

## 2021-01-20 MED ORDER — ORAL CARE MOUTH RINSE
15.0000 mL | Freq: Two times a day (BID) | OROMUCOSAL | Status: DC
  Administered 2021-01-20 (×2): 15 mL via OROMUCOSAL

## 2021-01-20 MED ORDER — INSULIN ASPART 100 UNIT/ML IJ SOLN
0.0000 [IU] | INTRAMUSCULAR | Status: DC
  Administered 2021-01-20: 2 [IU] via SUBCUTANEOUS
  Administered 2021-01-20: 8 [IU] via SUBCUTANEOUS
  Administered 2021-01-20: 12 [IU] via SUBCUTANEOUS
  Administered 2021-01-20: 8 [IU] via SUBCUTANEOUS
  Administered 2021-01-21: 4 [IU] via SUBCUTANEOUS
  Administered 2021-01-21: 12 [IU] via SUBCUTANEOUS
  Administered 2021-01-21: 8 [IU] via SUBCUTANEOUS
  Administered 2021-01-21: 12 [IU] via SUBCUTANEOUS
  Administered 2021-01-21: 2 [IU] via SUBCUTANEOUS
  Administered 2021-01-22: 8 [IU] via SUBCUTANEOUS

## 2021-01-20 MED ORDER — CHLORHEXIDINE GLUCONATE 0.12 % MT SOLN
15.0000 mL | Freq: Two times a day (BID) | OROMUCOSAL | Status: DC
  Administered 2021-01-20: 15 mL via OROMUCOSAL
  Filled 2021-01-20: qty 15

## 2021-01-20 NOTE — Progress Notes (Signed)
Patient ID: Shane Ford, male   DOB: 04/09/1946, 75 y.o.   MRN: [id number] [facility] Evening Rounds:  Hemodynamically stable in sinus rhythm.  Good urine output  CT output low.  Ambulated today.  BMET    Component Value Date/Time   NA 135 01/20/2021 1716   NA 142 12/22/2011 1342   K 4.0 01/20/2021 1716   K 4.4 12/22/2011 1342   CL 104 01/20/2021 1716   CL 109 (H) 12/22/2011 1342   CO2 22 01/20/2021 1716   CO2 29 12/22/2011 1342   GLUCOSE 283 (H) 01/20/2021 1716   GLUCOSE 141 (H) 12/22/2011 1342   BUN 12 01/20/2021 1716   BUN 16 12/22/2011 1342   CREATININE 1.13 01/20/2021 1716   CREATININE 1.18 12/22/2011 1342   CALCIUM 8.5 (L) 01/20/2021 1716   CALCIUM 8.3 (L) 12/22/2011 1342   GFRNONAA >60 01/20/2021 1716   GFRNONAA >60 12/22/2011 1342   GFRAA >60 12/22/2011 1342   CBC    Component Value Date/Time   WBC 9.3 01/20/2021 1716   RBC 3.62 (L) 01/20/2021 1716   HGB 11.1 (L) 01/20/2021 1716   HGB 13.2 12/22/2011 1342   HCT 33.2 (L) 01/20/2021 1716   HCT 40.2 12/22/2011 1342   PLT 167 01/20/2021 1716   PLT 184 12/22/2011 1342   MCV 91.7 01/20/2021 1716   MCV 89 12/22/2011 1342   MCH 30.7 01/20/2021 1716   MCHC 33.4 01/20/2021 1716   RDW 13.5 01/20/2021 1716   RDW 13.6 12/22/2011 1342   LYMPHSABS 1.9 12/22/2011 1342   MONOABS 0.4 12/22/2011 1342   EOSABS 0.2 12/22/2011 1342   BASOSABS 0.0 12/22/2011 1342   Doing well. Continue present care.

## 2021-01-20 NOTE — Discharge Instructions (Addendum)

## 2021-01-20 NOTE — Progress Notes (Signed)
      [address]       [place] [address]             [phone]                 1 Day Post-Op Procedure(s) (LRB): CORONARY ARTERY BYPASS GRAFTING (CABG) TIMES FOUR, ON PUMP, USING LEFT INTERNAL MAMMARY ARTERY AND ENDOSCOPICALLY HARVESTED GREATER SAPHENOUS VEIN (N/A) TRANSESOPHAGEAL ECHOCARDIOGRAM (TEE) (N/A) APPLICATION OF CELL SAVER ENDOVEIN HARVEST OF GREATER SAPHENOUS VEIN (Right)   Events: No events extubated _______________________________________________________________ Vitals: BP 105/69   Pulse 82   Temp 98.2 F (36.8 C) (Oral)   Resp (!) 22   Ht '6\' 3"'$  (1.905 m)   Wt 93.5 kg   SpO2 97%   BMI 25.76 kg/m   - Neuro: alert NAD   - Cardiovascular: sinus.  2nd degee block  Drips: none.   CVP:  [5 mmHg-12 mmHg] 10 mmHg  - Pulm: EWOB   ABG    Component Value Date/Time   PHART 7.370 01/19/2021 1207   PCO2ART 38.1 01/19/2021 1207   PO2ART 450 (H) 01/19/2021 1207   HCO3 22.0 01/19/2021 1207   TCO2 23 01/19/2021 1207   ACIDBASEDEF 3.0 (H) 01/19/2021 1207   O2SAT 100.0 01/19/2021 1207    - Abd: soft - Extremity: warm  .Intake/Output      07/26 0701 07/27 0700 07/27 0701 07/28 0700   I.V. (mL/kg) 2233.3 (23.9) 41.7 (0.4)   Blood 340    IV Piggyback 1738.8    Total Intake(mL/kg) 4312.1 (46.1) 41.7 (0.4)   Urine (mL/kg/hr) 2435 (1.1)    Stool     Blood 530    Chest Tube 770    Total Output 3735    Net +577.1 +41.7           _______________________________________________________________ Labs: CBC Latest Ref Rng & Units 01/20/2021 01/19/2021 01/19/2021  WBC 4.0 - 10.5 K/uL 7.7 7.8 8.7  Hemoglobin 13.0 - 17.0 g/dL 10.2(L) 11.0(L) 11.0(L)  Hematocrit 39.0 - 52.0 % 30.6(L) 31.9(L) 31.9(L)  Platelets 150 - 400 K/uL 145(L) 136(L) 122(L)   CMP Latest Ref Rng & Units 01/20/2021 01/19/2021 01/19/2021  Glucose 70 - 99 mg/dL 123(H) 105(H) -  BUN 8 - 23 mg/dL 10 9 -  Creatinine 0.61 - 1.24 mg/dL 1.00 0.87 -  Sodium 135 - 145 mmol/L 137  138 145  Potassium 3.5 - 5.1 mmol/L 3.7 4.1 3.0(L)  Chloride 98 - 111 mmol/L 107 111 -  CO2 22 - 32 mmol/L 23 22 -  Calcium 8.9 - 10.3 mg/dL 8.2(L) 8.0(L) -  Total Protein 6.5 - 8.1 g/dL - 4.9(L) -  Total Bilirubin 0.3 - 1.2 mg/dL - 1.1 -  Alkaline Phos 38 - 126 U/L - 24(L) -  AST 15 - 41 U/L - 57(H) -  ALT 0 - 44 U/L - 19 -    CXR: PV COngestion  _______________________________________________________________  Assessment and Plan: POD 1 s/p CABG, 2nd AV block  Neuro: pain controlled CV: off gtts.  Will hold BB for now.  Will remove A line Pulm: pulm hygiene Renal: creat stable, diurese today GI: advancing diet Heme: stable ID: [email or web address] Endo: SSI  Dispo: continue ICU care   Shane Ford 01/20/2021 8:24 AM

## 2021-01-20 NOTE — Progress Notes (Signed)
Progress Note  Patient Name: Shane Ford [facility] Date of Encounter: 01/20/2021  [facility] Cardiologist: [place], MD   Subjective   Awake, alert, and only complaining of pain though he does not appear to be uncomfortable.  All very minimal intravenous therapy.  Inpatient Medications    Scheduled Meds:  acetaminophen  1,000 mg Oral Q6H   Or   acetaminophen (TYLENOL) oral liquid 160 mg/5 mL  1,000 mg Per Tube Q6H   aspirin EC  325 mg Oral Daily   Or   aspirin  324 mg Per Tube Daily   atorvastatin  80 mg Oral Daily   bisacodyl  10 mg Oral Daily   Or   bisacodyl  10 mg Rectal Daily   Chlorhexidine Gluconate Cloth  6 each Topical Daily   docusate sodium  200 mg Oral Daily   donepezil  5 mg Oral QHS   mouth rinse  15 mL Mouth Rinse 10 times per day   metoprolol tartrate  12.5 mg Oral BID   Or   metoprolol tartrate  12.5 mg Per Tube BID   [START ON 01/21/2021] pantoprazole  40 mg Oral Daily   potassium chloride  20 mEq Oral Q4H   sodium chloride flush  3 mL Intravenous Q12H   venlafaxine XR  37.5 mg Oral Q breakfast   Continuous Infusions:  sodium chloride 20 mL/hr at 01/20/21 0800   sodium chloride     sodium chloride Stopped (01/19/21 1351)   albumin human 12.5 g (01/20/21 0234)    ceFAZolin (ANCEF) IV Stopped (01/20/21 0550)   dexmedetomidine (PRECEDEX) IV infusion Stopped (01/20/21 0351)   DOBUTamine Stopped (01/20/21 0516)   insulin 0.9 Units/hr (01/20/21 0800)   lactated ringers     lactated ringers     lactated ringers 20 mL/hr at 01/20/21 0800   niCARDipine Stopped (01/19/21 1616)   nitroGLYCERIN     norepinephrine (LEVOPHED) Adult infusion Stopped (01/20/21 0339)   PRN Meds: sodium chloride, albumin human, dextrose, lactated ringers, metoprolol tartrate, midazolam, morphine injection, ondansetron (ZOFRAN) IV, oxyCODONE, sodium chloride flush, sodium chloride flush, traMADol   Vital Signs    Vitals:   01/20/21 0500 01/20/21 0600 01/20/21 0700  01/20/21 0748  BP: 117/63 123/68 105/69   Pulse: 87 80 82   Resp: 18 20 (!) 22   Temp:    98.2 F (36.8 C)  TempSrc:    Oral  SpO2: 98% 97% 97%   Weight:      Height:        Intake/Output Summary (Last 24 hours) at 01/20/2021 0812 Last data filed at 01/20/2021 0800 Gross per 24 hour  Intake 3953.74 ml  Output 3735 ml  Net 218.74 ml   Last 3 Weights 01/20/2021 01/19/2021 01/17/2021  Weight (lbs) 206 lb 2.1 oz 195 lb 5.2 oz 196 lb  Weight (kg) 93.5 kg 88.6 kg 88.905 kg      Telemetry    Normal sinus rhythm with occasional Mobitz 1.- Personally Reviewed  ECG    Immediate postop EKGs revealed second-degree heart block, Mobitz 1.  No acute ST-T wave changes (01/19/2021.  Time 1316).  This a.m. there is sinus rhythm with predominantly first-degree AV block and rare periods of second-degree AV block Mobitz 1.  No acute ST-T wave change.- Personally Reviewed  Physical Exam  Appears comfortable GEN: No acute distress.   Neck: No JVD Cardiac: Sternal incision appears dry.  RRR, no murmurs, rub, or gallops.  Respiratory: Clear to auscultation  bilaterally. GI: Soft, nontender, non-distended  MS: No edema; No deformity. Neuro:  Nonfocal  Psych: Normal affect   Labs    High Sensitivity Troponin:   Recent Labs  Lab 01/06/21 1601 01/06/21 1801 01/17/21 1801 01/17/21 2106 01/17/21 2325  TROPONINIHS 11 13 394* 381* 2,549*      Chemistry Recent Labs  Lab 01/19/21 0228 01/19/21 0810 01/19/21 1151 01/19/21 1207 01/19/21 1901 01/20/21 0321  NA 137   < > 143 145 138 137  K 4.0   < > 3.2* 3.0* 4.1 3.7  CL 105   < > 111  --  111 107  CO2 24  --   --   --  22 23  GLUCOSE 226*   < > 118*  --  105* 123*  BUN 13   < > 9  --  9 10  CREATININE 1.04   < > 0.40*  --  0.87 1.00  CALCIUM 9.2  --   --   --  8.0* 8.2*  PROT  --   --   --   --  4.9*  --   ALBUMIN  --   --   --   --  3.4*  --   AST  --   --   --   --  57*  --   ALT  --   --   --   --  19  --   ALKPHOS  --   --   --    --  24*  --   BILITOT  --   --   --   --  1.1  --   GFRNONAA >60  --   --   --  >60 >60  ANIONGAP 8  --   --   --  5 7   < > = values in this interval not displayed.     Hematology Recent Labs  Lab 01/19/21 1316 01/19/21 1901 01/20/21 0321  WBC 8.7 7.8 7.7  RBC 3.57* 3.56* 3.37*  HGB 11.0* 11.0* 10.2*  HCT 31.9* 31.9* 30.6*  MCV 89.4 89.6 90.8  MCH 30.8 30.9 30.3  MCHC 34.5 34.5 33.3  RDW 12.8 13.0 13.2  PLT 122* 136* 145*    BNPNo results for input(s): BNP, PROBNP in the last 168 hours.   DDimer No results for input(s): DDIMER in the last 168 hours.   Radiology    DG Chest Port 1 View  Result Date: 01/19/2021 CLINICAL DATA:  75 year old male status post CABG EXAM: PORTABLE CHEST 1 VIEW COMPARISON:  01/17/2021 FINDINGS: Interval surgical changes of median sternotomy and CABG. Endotracheal tube terminates 5.7 cm above the carina, suitable E position. Partially visualized right IJ sheath. Sheath terminates in the superior vena cava. Mediastinal and left-sided pleural drains in place. Epicardial pacing leads in place. Low lung volumes with linear opacities bilateral. No pneumothorax or pleural effusion. No significant confluent airspace disease. IMPRESSION: Early postsurgical changes of CABG, with no pneumothorax or pleural effusion. Surgical apparatus as above. Electronically Signed   By: Corrie Mckusick D.O.   On: 01/19/2021 13:55   ECHOCARDIOGRAM COMPLETE  Result Date: 01/18/2021    ECHOCARDIOGRAM REPORT   Patient Name:   Shane Ford [facility] Date of Exam: 01/18/2021 Medical Rec #:  [id number]          Height:       75.0 in Accession #:    [id number]         Weight:  196.0 lb Date of Birth:  [birth year]          BSA:          2.176 m Patient Age:    75 years           BP:           135/69 mmHg Patient Gender: M                  HR:           75 bpm. Exam Location:  Inpatient Procedure: 2D Echo, Color Doppler, Cardiac Doppler and Strain Analysis Indications:    Acute ischemic heart  disease, unspecified I24.9  History:        Patient has no prior history of Echocardiogram examinations.                 Acute MI and CAD, Arrythmias:Type I AV block,                 Signs/Symptoms:Chest discomfort; Risk Factors:Hypertension,                 Diabetes and Dyslipidemia.  Sonographer:    Darlina Sicilian RDCS Referring Phys: [id number] [place]  1. Left ventricular ejection fraction, by estimation, is 55 to 60%. The left ventricle has normal function. Left ventricular endocardial border not optimally defined to evaluate regional wall motion. There is moderate left ventricular hypertrophy. Left ventricular diastolic parameters are consistent with Grade I diastolic dysfunction (impaired relaxation).  2. Right ventricular systolic function is normal. The right ventricular size is normal. Tricuspid regurgitation signal is inadequate for assessing PA pressure.  3. The mitral valve is normal in structure. No evidence of mitral valve regurgitation. No evidence of mitral stenosis.  4. The aortic valve is tricuspid. Aortic valve regurgitation is not visualized. No aortic stenosis is present.  5. The inferior vena cava is normal in size with greater than 50% respiratory variability, suggesting right atrial pressure of 3 mmHg.  6. Technically difficult study with poor acoustic windows. FINDINGS  Left Ventricle: Left ventricular ejection fraction, by estimation, is 55 to 60%. The left ventricle has normal function. Left ventricular endocardial border not optimally defined to evaluate regional wall motion. The left ventricular internal cavity size was normal in size. There is moderate left ventricular hypertrophy. Left ventricular diastolic parameters are consistent with Grade I diastolic dysfunction (impaired relaxation). Right Ventricle: The right ventricular size is normal. No increase in right ventricular wall thickness. Right ventricular systolic function is normal. Tricuspid regurgitation  signal is inadequate for assessing PA pressure. Left Atrium: Left atrial size was normal in size. Right Atrium: Right atrial size was normal in size. Pericardium: There is no evidence of pericardial effusion. Mitral Valve: The mitral valve is normal in structure. No evidence of mitral valve regurgitation. No evidence of mitral valve stenosis. Tricuspid Valve: The tricuspid valve is normal in structure. Tricuspid valve regurgitation is not demonstrated. Aortic Valve: The aortic valve is tricuspid. Aortic valve regurgitation is not visualized. No aortic stenosis is present. Pulmonic Valve: The pulmonic valve was normal in structure. Pulmonic valve regurgitation is not visualized. Aorta: The aortic root is normal in size and structure. Venous: The inferior vena cava is normal in size with greater than 50% respiratory variability, suggesting right atrial pressure of 3 mmHg. IAS/Shunts: No atrial level shunt detected by color flow Doppler.  LEFT VENTRICLE PLAX 2D LVIDd:         3.90 cm  Diastology  LVIDs:         2.90 cm  LV e' medial:    3.79 cm/s LV PW:         1.30 cm  LV E/e' medial:  10.8 LV IVS:        1.60 cm  LV e' lateral:   8.28 cm/s LVOT diam:     1.90 cm  LV E/e' lateral: 5.0 LV SV:         27 LV SV Index:   12 LVOT Area:     2.84 cm  RIGHT VENTRICLE TAPSE (M-mode): 1.7 cm LEFT ATRIUM             Index LA diam:        2.80 cm 1.29 cm/m LA Vol (A2C):   29.1 ml 13.38 ml/m LA Vol (A4C):   36.2 ml 16.64 ml/m LA Biplane Vol: 33.9 ml 15.58 ml/m  AORTIC VALVE LVOT Vmax:   78.30 cm/s LVOT Vmean:  51.700 cm/s LVOT VTI:    0.096 m  AORTA Ao Root diam: 3.20 cm Ao Asc diam:  3.80 cm MITRAL VALVE MV Area (PHT): 3.58 cm    SHUNTS MV Decel Time: 212 msec    Systemic VTI:  0.10 m MV E velocity: 41.00 cm/s  Systemic Diam: 1.90 cm MV A velocity: 53.20 cm/s MV E/A ratio:  0.77 Loralie Champagne MD Electronically signed by Loralie Champagne MD Signature Date/Time: 01/18/2021/3:14:47 PM    Final    ECHO INTRAOPERATIVE TEE  Result  Date: 01/19/2021  *INTRAOPERATIVE TRANSESOPHAGEAL REPORT *  Patient Name:   Shane Ford [facility] Date of Exam: 01/19/2021 Medical Rec #:  [id number]          Height:       75.0 in Accession #:    [id number]         Weight:       195.3 lb Date of Birth:  [birth year]          BSA:          2.17 m Patient Age:    89 years           BP:           159/70 mmHg Patient Gender: M                  HR:           56 bpm. Exam Location:  Inpatient Transesophogeal exam was perform intraoperatively during surgical procedure. Patient was closely monitored under general anesthesia during the entirety of examination. Indications:     I25.110 Atherosclerotic heart disease of native coronary artery                  with unstable angina pectoris Performing Phys: Renold Don MD Diagnosing Phys: Renold Don MD Complications: No known complications during this procedure. POST-OP IMPRESSIONS - Left Ventricle: The left ventricle is unchanged from pre-bypass. - Right Ventricle: The right ventricle appears unchanged from pre-bypass. - Aorta: The aorta appears unchanged from pre-bypass. - Left Atrium: The left atrium appears unchanged from pre-bypass. - Left Atrial Appendage: The left atrial appendage appears unchanged from pre-bypass. - Aortic Valve: The aortic valve appears unchanged from pre-bypass. - Mitral Valve: The mitral valve appears unchanged from pre-bypass. - Tricuspid Valve: The tricuspid valve appears unchanged from pre-bypass. - Pulmonic Valve: The pulmonic valve appears unchanged from pre-bypass. - Interatrial Septum: The interatrial septum appears unchanged from pre-bypass. - Interventricular Septum: The interventricular septum appears unchanged from pre-bypass. -  Pericardium: The pericardium appears unchanged from pre-bypass. PRE-OP FINDINGS  Left Ventricle: The left ventricle has normal systolic function, with an ejection fraction of 55-60%. The cavity size was normal. There is moderate concentric left ventricular hypertrophy.  There is moderate concentric left ventricular hypertrophy. Right Ventricle: The right ventricle has normal systolic function. The cavity was normal. There is no increase in right ventricular wall thickness. Left Atrium: Left atrial size was normal in size. No left atrial/left atrial appendage thrombus was detected. Right Atrium: Right atrial size was normal in size. Interatrial Septum: No atrial level shunt detected by color flow Doppler. Pericardium: There is no evidence of pericardial effusion. Mitral Valve: The mitral valve is normal in structure. Mitral valve regurgitation is mild by color flow Doppler. There is No evidence of mitral stenosis. Tricuspid Valve: The tricuspid valve was normal in structure. Tricuspid valve regurgitation was not visualized by color flow Doppler. Aortic Valve: The aortic valve is tricuspid Aortic valve regurgitation was not visualized by color flow Doppler. There is no stenosis of the aortic valve. Pulmonic Valve: The pulmonic valve was normal in structure. Pulmonic valve regurgitation is not visualized by color flow Doppler.  Renold Don MD Electronically signed by Renold Don MD Signature Date/Time: 01/19/2021/12:26:57 PM    Final    VAS US DOPPLER PRE CABG  Result Date: 01/18/2021 PREOPERATIVE VASCULAR EVALUATION Patient Name:  Shane Ford [facility]  Date of Exam:   01/18/2021 Medical Rec #: [id number]           Accession #:    [id number] Date of Birth: [birth year]           Patient Gender: M Patient Age:   56Y Exam Location:  [facility] Procedure:      VAS US DOPPLER PRE CABG Referring Phys: [id number] HARRELL O [name] --------------------------------------------------------------------------------  Indications:      Pre-CABG. Risk Factors:     Hypertension, hyperlipidemia, Diabetes. Comparison Study: No prior study Performing Technologist: Maudry Mayhew MHA, RDMS, RVT, RDCS  Examination Guidelines: A complete evaluation includes B-mode imaging, spectral Doppler,  color Doppler, and power Doppler as needed of all accessible portions of each vessel. Bilateral testing is considered an integral part of a complete examination. Limited examinations for reoccurring indications may be performed as noted.  Right Carotid Findings: +----------+--------+--------+--------+-------------------------------+--------+           PSV cm/sEDV cm/sStenosisDescribe                       Comments +----------+--------+--------+--------+-------------------------------+--------+ CCA Prox  93      9                                                       +----------+--------+--------+--------+-------------------------------+--------+ CCA Distal98      15                                                      +----------+--------+--------+--------+-------------------------------+--------+ ICA Prox  54      11              heterogenous, irregular and  hyperechoic                             +----------+--------+--------+--------+-------------------------------+--------+ ICA Mid   123     27                                                      +----------+--------+--------+--------+-------------------------------+--------+ ICA Distal83      19                                                      +----------+--------+--------+--------+-------------------------------+--------+ ECA       202     12              heterogenous and irregular              +----------+--------+--------+--------+-------------------------------+--------+ Portions of this table do not appear on this page. +----------+--------+-------+----------------+------------+           PSV cm/sEDV cmsDescribe        Arm Pressure +----------+--------+-------+----------------+------------+ Subclavian97             Multiphasic, WNL             +----------+--------+-------+----------------+------------+  +---------+--------+--+--------+--+---------+ VertebralPSV cm/s47EDV cm/s12Antegrade +---------+--------+--+--------+--+---------+ Left Carotid Findings: +----------+--------+-------+--------+--------------------------------+--------+           PSV cm/sEDV    StenosisDescribe                        Comments                   cm/s                                                    +----------+--------+-------+--------+--------------------------------+--------+ CCA Prox  130     23                                                      +----------+--------+-------+--------+--------------------------------+--------+ CCA Distal97      17                                                      +----------+--------+-------+--------+--------------------------------+--------+ ICA Prox  82      17             smooth, heterogenous and                                                  calcific                                 +----------+--------+-------+--------+--------------------------------+--------+  ICA Distal65      20                                                      +----------+--------+-------+--------+--------------------------------+--------+ ECA       140                                                             +----------+--------+-------+--------+--------------------------------+--------+ +----------+--------+--------+----------------+------------+ SubclavianPSV cm/sEDV cm/sDescribe        Arm Pressure +----------+--------+--------+----------------+------------+           123             Multiphasic, WNL             +----------+--------+--------+----------------+------------+ +---------+--------+--+--------+--+---------+ VertebralPSV cm/s36EDV cm/s11Antegrade +---------+--------+--+--------+--+---------+  ABI Findings: +--------+------------------+-----+---------+--------+ Right   Rt Pressure (mmHg)IndexWaveform Comment   +--------+------------------+-----+---------+--------+ [id number]                    triphasic         +--------+------------------+-----+---------+--------+ PTA                            triphasic         +--------+------------------+-----+---------+--------+ DP                             triphasic         +--------+------------------+-----+---------+--------+ +--------+------------------+-----+---------+---------------------------------+ Left    Lt Pressure (mmHg)IndexWaveform Comment                           +--------+------------------+-----+---------+---------------------------------+ Brachial                       triphasicUnable to obtain pressure due to                                          IV                                +--------+------------------+-----+---------+---------------------------------+ PTA                            triphasic                                  +--------+------------------+-----+---------+---------------------------------+ DP                             triphasic                                  +--------+------------------+-----+---------+---------------------------------+  Right Doppler Findings: +-----------+--------+-----+---------+-----------------------------------------+ Site       PressureIndexDoppler  Comments                                  +-----------+--------+-----+---------+-----------------------------------------+  Brachial   118          triphasic                                          +-----------+--------+-----+---------+-----------------------------------------+ Radial                  triphasic                                          +-----------+--------+-----+---------+-----------------------------------------+ Ulnar                   triphasic                                          +-----------+--------+-----+---------+-----------------------------------------+ Palmar  Arch                      Signal is unaffected with radial                                           compression, obliterates with ulnar                                        compression.                              +-----------+--------+-----+---------+-----------------------------------------+  Left Doppler Findings: +-----------+--------+-----+---------+-----------------------------------------+ Site       PressureIndexDoppler  Comments                                  +-----------+--------+-----+---------+-----------------------------------------+ Brachial                triphasicUnable to obtain pressure due to IV       +-----------+--------+-----+---------+-----------------------------------------+ Radial                  triphasic                                          +-----------+--------+-----+---------+-----------------------------------------+ Ulnar                   triphasic                                          +-----------+--------+-----+---------+-----------------------------------------+ Palmar Arch                      Signal is unaffected with radial                                           compression, obliterates with ulnar  compression.                              +-----------+--------+-----+---------+-----------------------------------------+  Summary: Right Carotid: Velocities in the right ICA are consistent with a 1-39% stenosis. Left Carotid: Velocities in the left ICA are consistent with a 1-39% stenosis. Vertebrals:  Bilateral vertebral arteries demonstrate antegrade flow. Subclavians: Normal flow hemodynamics were seen in bilateral subclavian              arteries. Bilateral Pedal Waveforms: Pedal waveforms are within normal limits at rest. Right Upper Extremity: Doppler waveforms remain within normal limits with right radial compression. Doppler waveform obliterate with right ulnar  compression. Left Upper Extremity: Doppler waveforms remain within normal limits with left radial compression. Doppler waveform obliterate with left ulnar compression.  Electronically signed by Ruta Hinds MD on 01/18/2021 at 4:31:18 PM.    Final     Cardiac Studies   No new studies  Patient Profile     75 y.o. male  hypertension, early dementia, primary hypertension, hyperlipidemia, diabetes mellitus type 2, and ST elevation MI related to occlusion of LV branch of RCA that was too far distal and too small to treat interventionally.  Successful four-vessel bypass with LIMA to LAD, SVG to PL, SVG to OM, SVG to diagonal 1 ([name]).  Assessment & Plan    ST elevation MI: Small infarct due to occlusion of small left ventricular branch of RCA  Complex coronary artery disease: He is now status post successful CABG x4 with LIMA to LAD and also saphenous vein grafts to diagonal, OM, and PL.   Cognitive impairment: No significant issues at this point. Hyperlipidemia: Target LDL less than 70 Diabetes mellitus type 2: Check hemoglobin A1c was 8.5 on admission.  Consider SGLT2 therapy long-term for optimal protection against CV events Primary hypertension: Target less than 140/80 mmHg.  Will need to be careful with beta-blocker therapy due to first-degree AV block and instances of Mobitz 1 second-degree block.   AV node conduction system disease: Probably not a good candidate for beta-blocker therapy to avoid high-grade AV block.  Hopefully he will not have atrial fibrillation and require AV node blocking agents.  Suspect if he develops atrial fibrillation rate would not be difficult to control.  Amiodarone would probably also not be a good medication to use since it will hang around and may complicate AV block for a longer period of time.  For questions or updates, please contact [place] Please consult [email or web address] for contact info under        Signed, Sinclair Grooms, MD  01/20/2021,  8:12 AM

## 2021-01-20 NOTE — Plan of Care (Signed)

## 2021-01-20 NOTE — Hospital Course (Addendum)
Referring: Tamala Julian Primary Care: Rusty Aus, MD Primary Cardiologist:Henry Nicholes Stairs III, MD   History of Present Illness:   at time of CT surgery consultation    [name] is a 75 yo male with known history of HTN, Hyperlipidemia, DM Type 2, Dementia, and known CAD with stent to the RCA placed in 1999.  He developed a several day complaint of chest, neck and, arm pain.  He initially presented to the ED about 9 days ago at which time workup was unremarkable.  However on 01/17/2021 the patients chest pain persisted and progressed and was substernal in nature.  He presented to the ED for evaluation with EKG which showed inferior q waves with elevation in II, III, and aVF.  Troponin level was elevated and he was ruled in for STEMI.  He was taken directly to the catheterization lab and found to have severe 3V CAD.  He was started on Heparin and NTG drip.  He was admitted and Cardiothoracic surgery consultation was requested for possible coronary bypass.  Currently the patient is chest pain free.  Overall he is active.  In regards to his Dementia it is in the early stages.  His wife and son state that he mainly has mild forgetfulness to this point.  His diabetes is well controlled with most recent A1c being 7.1.  He doesn't smoke.  The patient and all relevant studies were reviewed by Dr. Kipp Brood he was agreeable to proceed with coronary artery surgical revascularization.  Hospital course:  Following medical stabilization and preoperative testing the patient was ready to proceed with surgery and on 01/19/2021 he was taken the operating room where he underwent the following procedure: Procedure: CABG X 4, LIMA LAD, reverse saphenous vein graft to posterior lateral branch, OM1, first diagonal. Endoscopic greater saphenous vein harvest on the right  Patient tolerated procedure well and was taken to the surgical intensive care unit in stable condition.  Postoperative hospital course:  The patient was  extubated without difficulty using standard post cardiac surgical protocols.  He has remained neurologically intact with good pain control.  He was weaned off intravenous support at all routine lines, monitors and drainage devices have been discontinued in the standard fashion.  He was started on a course of routine pulmonary hygiene.  He did show evidence of postoperative volume overload and was started on a course of diuretics.  He is noted to have a expected acute blood loss anemia and values have stabilized. We were advancing his diet as tolerated. On the floor he continued to progress. We continued his diuretic regimen for fluid overload and encouraged ambulation. He was hypertensive and he was restarted on his home regimen of Lisinopril which was titrated as needed.  His pacing wires were removed without difficulty.  He remains in NSR with 1st AV Block.  He was not started on Lopressor.  This can hopefully be started later at hospital follow up.  He is ambulating without difficulty.  His surgical incisions are healing without evidence of infection.  He is medically stable for discharge home today.

## 2021-01-21 ENCOUNTER — Inpatient Hospital Stay (HOSPITAL_COMMUNITY): Payer: Medicare HMO

## 2021-01-21 DIAGNOSIS — R079 Chest pain, unspecified: Secondary | ICD-10-CM

## 2021-01-21 LAB — GLUCOSE, CAPILLARY
Glucose-Capillary: 159 mg/dL — ABNORMAL HIGH (ref 70–99)
Glucose-Capillary: 180 mg/dL — ABNORMAL HIGH (ref 70–99)
Glucose-Capillary: 220 mg/dL — ABNORMAL HIGH (ref 70–99)
Glucose-Capillary: 276 mg/dL — ABNORMAL HIGH (ref 70–99)
Glucose-Capillary: 266 mg/dL — ABNORMAL HIGH (ref 70–99)

## 2021-01-21 LAB — BASIC METABOLIC PANEL
Anion gap: 6 (ref 5–15)
BUN: 14 mg/dL (ref 8–23)
CO2: 27 mmol/L (ref 22–32)
Calcium: 8.8 mg/dL — ABNORMAL LOW (ref 8.9–10.3)
Chloride: 101 mmol/L (ref 98–111)
Creatinine, Ser: 1.02 mg/dL (ref 0.61–1.24)
GFR, Estimated: >60 mL/min (ref >60)
Glucose, Bld: 151 mg/dL — ABNORMAL HIGH (ref 70–99)
Potassium: 4 mmol/L (ref 3.5–5.1)
Sodium: 134 mmol/L — ABNORMAL LOW (ref 135–145)

## 2021-01-21 LAB — CBC
HCT: 32.2 % — ABNORMAL LOW (ref 39.0–52.0)
Hemoglobin: 10.5 g/dL — ABNORMAL LOW (ref 13.0–17.0)
MCH: 30.3 pg (ref 26.0–34.0)
MCHC: 32.6 g/dL (ref 30.0–36.0)
MCV: 92.8 fL (ref 80.0–100.0)
Platelets: 179 10*3/uL (ref 150–400)
RBC: 3.47 MIL/uL — ABNORMAL LOW (ref 4.22–5.81)
RDW: 13.5 % (ref 11.5–15.5)
WBC: 8 10*3/uL (ref 4.0–10.5)
nRBC: 0 % (ref 0.0–0.2)

## 2021-01-21 MED ORDER — FUROSEMIDE 40 MG PO TABS
40.0000 mg | ORAL_TABLET | Freq: Every day | ORAL | Status: DC
  Administered 2021-01-21 – 2021-01-23 (×3): 40 mg via ORAL
  Filled 2021-01-21 (×3): qty 1

## 2021-01-21 MED ORDER — SODIUM CHLORIDE 0.9% FLUSH: 3.0000 mL | INTRAVENOUS | Status: DC | PRN

## 2021-01-21 MED ORDER — LISINOPRIL 5 MG PO TABS
5.0000 mg | ORAL_TABLET | Freq: Every day | ORAL | Status: DC
  Administered 2021-01-21 – 2021-01-22 (×2): 5 mg via ORAL
  Filled 2021-01-21 (×2): qty 1

## 2021-01-21 MED ORDER — SODIUM CHLORIDE 0.9 % IV SOLN: 250.0000 mL | INTRAVENOUS | Status: DC | PRN

## 2021-01-21 MED ORDER — ~~LOC~~ CARDIAC SURGERY, PATIENT & FAMILY EDUCATION: Freq: Once | Status: AC

## 2021-01-21 MED ORDER — SODIUM CHLORIDE 0.9% FLUSH
3.0000 mL | Freq: Two times a day (BID) | INTRAVENOUS | Status: DC
  Administered 2021-01-21 – 2021-01-22 (×4): 3 mL via INTRAVENOUS

## 2021-01-21 MED ORDER — POTASSIUM CHLORIDE CRYS ER 20 MEQ PO TBCR
40.0000 meq | EXTENDED_RELEASE_TABLET | Freq: Every day | ORAL | Status: DC
  Administered 2021-01-21 – 2021-01-23 (×3): 40 meq via ORAL
  Filled 2021-01-21 (×3): qty 2

## 2021-01-21 MED FILL — Heparin Sodium (Porcine) Inj 1000 Unit/ML: INTRAMUSCULAR | Qty: 50 | Status: AC

## 2021-01-21 MED FILL — Heparin Sodium (Porcine) Inj 1000 Unit/ML: INTRAMUSCULAR | Qty: 30 | Status: AC

## 2021-01-21 MED FILL — Calcium Chloride Inj 10%: INTRAVENOUS | Qty: 10 | Status: AC

## 2021-01-21 MED FILL — Sodium Chloride IV Soln 0.9%: INTRAVENOUS | Qty: 2000 | Status: AC

## 2021-01-21 MED FILL — Potassium Chloride Inj 2 mEq/ML: INTRAVENOUS | Qty: 40 | Status: AC

## 2021-01-21 MED FILL — Sodium Bicarbonate IV Soln 8.4%: INTRAVENOUS | Qty: 50 | Status: AC

## 2021-01-21 MED FILL — Electrolyte-R (PH 7.4) Solution: INTRAVENOUS | Qty: 4000 | Status: AC

## 2021-01-21 MED FILL — Lidocaine HCl Local Preservative Free (PF) Inj 2%: INTRAMUSCULAR | Qty: 15 | Status: AC

## 2021-01-21 MED FILL — Mannitol IV Soln 20%: INTRAVENOUS | Qty: 500 | Status: AC

## 2021-01-21 NOTE — Progress Notes (Signed)
CARDIAC REHAB PHASE I   PRE:  Rate/Rhythm: 91 SR  BP:  Supine: 125/77  Sitting:   Standing:    SaO2: 95%RA  MODE:  Ambulation: 370 ft   POST:  Rate/Rhythm: 108 ST  BP:  Supine:   Sitting: 149/74  Standing:    SaO2: 99%RA 1419-1500 Pt walked 370 ft on RA with asst x 1 and rolling walker for third walk today. Has tendency to go to left side which he stated is normal for him. Encouraged pt to stay in the middle of hall. Reinforced sternal precautions. Having a little difficulty remembering things he stated. To wheelchair for transfer to step down.    [name], RN BSN  01/21/2021 2:55 PM

## 2021-01-21 NOTE — Progress Notes (Signed)
      [address]       [place] [address]             [phone]                 2 Days Post-Op Procedure(s) (LRB): CORONARY ARTERY BYPASS GRAFTING (CABG) TIMES FOUR, ON PUMP, USING LEFT INTERNAL MAMMARY ARTERY AND ENDOSCOPICALLY HARVESTED GREATER SAPHENOUS VEIN (N/A) TRANSESOPHAGEAL ECHOCARDIOGRAM (TEE) (N/A) APPLICATION OF CELL SAVER ENDOVEIN HARVEST OF GREATER SAPHENOUS VEIN (Right)   Events: No events  _______________________________________________________________ Vitals: BP (!) 145/84   Pulse 88   Temp 97.8 F (36.6 C) (Oral)   Resp (!) 24   Ht '6\' 3"'$  (1.905 m)   Wt 93.1 kg   SpO2 92%   BMI 25.65 kg/m   - Neuro: alert NAD   - Cardiovascular: sinus.  2nd degee block  Drips: none.      - Pulm: EWOB   ABG    Component Value Date/Time   PHART 7.369 01/19/2021 1802   PCO2ART 38.2 01/19/2021 1802   PO2ART 117 (H) 01/19/2021 1802   HCO3 21.9 01/19/2021 1802   TCO2 23 01/19/2021 1802   ACIDBASEDEF 3.0 (H) 01/19/2021 1802   O2SAT 98.0 01/19/2021 1802    - Abd: soft - Extremity: warm  .Intake/Output      07/27 0701 07/28 0700 07/28 0701 07/29 0700   P.O. 720    I.V. (mL/kg) 115.4 (1.2)    Blood     IV Piggyback 300.1    Total Intake(mL/kg) 1135.5 (12.2)    Urine (mL/kg/hr) 1875 (0.8)    Blood     Chest Tube 70    Total Output 1945    Net -809.5         Urine Occurrence 2 x       _______________________________________________________________ Labs: CBC Latest Ref Rng & Units 01/21/2021 01/20/2021 01/20/2021  WBC 4.0 - 10.5 K/uL 8.0 9.3 7.7  Hemoglobin 13.0 - 17.0 g/dL 10.5(L) 11.1(L) 10.2(L)  Hematocrit 39.0 - 52.0 % 32.2(L) 33.2(L) 30.6(L)  Platelets 150 - 400 K/uL 179 167 145(L)   CMP Latest Ref Rng & Units 01/21/2021 01/20/2021 01/20/2021  Glucose 70 - 99 mg/dL 151(H) 283(H) 123(H)  BUN 8 - 23 mg/dL '14 12 10  '$ Creatinine 0.61 - 1.24 mg/dL 1.02 1.13 1.00  Sodium 135 - 145 mmol/L 134(L) 135 137  Potassium 3.5 - 5.1 mmol/L  4.0 4.0 3.7  Chloride 98 - 111 mmol/L 101 104 107  CO2 22 - 32 mmol/L '27 22 23  '$ Calcium 8.9 - 10.3 mg/dL 8.8(L) 8.5(L) 8.2(L)  Total Protein 6.5 - 8.1 g/dL - - -  Total Bilirubin 0.3 - 1.2 mg/dL - - -  Alkaline Phos 38 - 126 U/L - - -  AST 15 - 41 U/L - - -  ALT 0 - 44 U/L - - -    CXR: PV COngestion  _______________________________________________________________  Assessment and Plan: POD 2 s/p CABG, 2nd AV block  Neuro: pain controlled CV: off gtts.  Will hold BB for now.  Adding lisinopril for BP control Pulm: pulm hygiene Renal: creat stable, diurese today GI: on diet Heme: stable ID: [email or web address] Endo: SSI  Dispo: floor   Kitana Gage O Jacere Pangborn 01/21/2021 8:09 AM

## 2021-01-21 NOTE — Progress Notes (Signed)
Pt arrived to unit from 2 heart VSS, A/O x 4,  CCMD called ,CHG given, pt oriented to unit,Will continue to monitor. Midline incision and right leg harvest site.  Albin Felling Nuchem Grattan, RN    01/21/21 1512  Vitals  Temp 97.7 F (36.5 C)  Temp Source Oral  BP 135/71  MAP (mmHg) 88  BP Location Left Arm  BP Method Automatic  Patient Position (if appropriate) Lying  Pulse Rate 90  Pulse Rate Source Monitor  ECG Heart Rate 93  Resp 18  Level of Consciousness  Level of Consciousness Alert  Oxygen Therapy  SpO2 94 %  O2 Device Room Air  O2 Flow Rate (L/min) 0 L/min  Pain Assessment  Pain Scale 0-10  Pain Score 0  MEWS Score  MEWS Temp 0  MEWS Systolic 0  MEWS Pulse 0  MEWS RR 0  MEWS LOC 0  MEWS Score 0  MEWS Score Color Nyoka Cowden

## 2021-01-21 NOTE — Progress Notes (Signed)
Pacing wires pulled earlier. Normal sinus rhythm with first-degree AV block. Feels well.

## 2021-01-22 LAB — GLUCOSE, CAPILLARY
Glucose-Capillary: 117 mg/dL — ABNORMAL HIGH (ref 70–99)
Glucose-Capillary: 215 mg/dL — ABNORMAL HIGH (ref 70–99)
Glucose-Capillary: 226 mg/dL — ABNORMAL HIGH (ref 70–99)
Glucose-Capillary: 230 mg/dL — ABNORMAL HIGH (ref 70–99)
Glucose-Capillary: 250 mg/dL — ABNORMAL HIGH (ref 70–99)
Glucose-Capillary: 312 mg/dL — ABNORMAL HIGH (ref 70–99)

## 2021-01-22 LAB — CBC
HCT: 29.8 % — ABNORMAL LOW (ref 39.0–52.0)
Hemoglobin: 10 g/dL — ABNORMAL LOW (ref 13.0–17.0)
MCH: 30.5 pg (ref 26.0–34.0)
MCHC: 33.6 g/dL (ref 30.0–36.0)
MCV: 90.9 fL (ref 80.0–100.0)
Platelets: 186 10*3/uL (ref 150–400)
RBC: 3.28 MIL/uL — ABNORMAL LOW (ref 4.22–5.81)
RDW: 13.2 % (ref 11.5–15.5)
WBC: 7 10*3/uL (ref 4.0–10.5)
nRBC: 0 % (ref 0.0–0.2)

## 2021-01-22 LAB — BASIC METABOLIC PANEL
Anion gap: 3 — ABNORMAL LOW (ref 5–15)
BUN: 17 mg/dL (ref 8–23)
CO2: 28 mmol/L (ref 22–32)
Calcium: 8.6 mg/dL — ABNORMAL LOW (ref 8.9–10.3)
Chloride: 103 mmol/L (ref 98–111)
Creatinine, Ser: 0.99 mg/dL (ref 0.61–1.24)
GFR, Estimated: >60 mL/min (ref >60)
Glucose, Bld: 183 mg/dL — ABNORMAL HIGH (ref 70–99)
Potassium: 4.3 mmol/L (ref 3.5–5.1)
Sodium: 134 mmol/L — ABNORMAL LOW (ref 135–145)

## 2021-01-22 LAB — BPAM RBC
Blood Product Expiration Date: 202208232359
Blood Product Expiration Date: 202208232359
Unit Type and Rh: 6200
Unit Type and Rh: 6200

## 2021-01-22 LAB — TYPE AND SCREEN
ABO/RH(D): AB POS
Antibody Screen: NEGATIVE
Unit division: 0
Unit division: 0

## 2021-01-22 MED ORDER — INSULIN ASPART 100 UNIT/ML IJ SOLN
0.0000 [IU] | Freq: Three times a day (TID) | INTRAMUSCULAR | Status: DC
  Administered 2021-01-22: 16 [IU] via SUBCUTANEOUS
  Administered 2021-01-22 (×2): 8 [IU] via SUBCUTANEOUS
  Administered 2021-01-23: 4 [IU] via SUBCUTANEOUS

## 2021-01-22 MED ORDER — INSULIN ASPART 100 UNIT/ML IJ SOLN: 0.0000 [IU] | INTRAMUSCULAR | Status: DC

## 2021-01-22 NOTE — Progress Notes (Signed)
3 Days Post-Op Procedure(s) (LRB): CORONARY ARTERY BYPASS GRAFTING (CABG) TIMES FOUR, ON PUMP, USING LEFT INTERNAL MAMMARY ARTERY AND ENDOSCOPICALLY HARVESTED GREATER SAPHENOUS VEIN (N/A) TRANSESOPHAGEAL ECHOCARDIOGRAM (TEE) (N/A) APPLICATION OF CELL SAVER ENDOVEIN HARVEST OF GREATER SAPHENOUS VEIN (Right) Subjective: Feels okay this morning, no complaints  Objective: Vital signs in last 24 hours: Temp:  [97.7 F (36.5 C)-98.7 F (37.1 C)] 97.7 F (36.5 C) (07/29 0414) Pulse Rate:  [84-103] 86 (07/29 0414) Cardiac Rhythm: Normal sinus rhythm (07/29 0001) Resp:  [15-27] 20 (07/29 0414) BP: (122-162)/(67-89) 139/68 (07/29 0414) SpO2:  [90 %-97 %] 96 % (07/29 0414) Weight:  [92.5 kg] 92.5 kg (07/29 0414)     Intake/Output from previous day: 07/28 0701 - 07/29 0700 In: 120 [P.O.:120] Out: 925 [Urine:925] Intake/Output this shift: No intake/output data recorded.  General appearance: alert, cooperative, and no distress Heart: regular rate and rhythm, S1, S2 normal, no murmur, click, rub or gallop Lungs: clear to auscultation bilaterally Abdomen: soft, non-tender; bowel sounds normal; no masses,  no organomegaly Extremities: extremities normal, atraumatic, no cyanosis or edema Wound: clean and dry  Lab Results: Recent Labs    01/21/21 0430 01/22/21 0055  WBC 8.0 7.0  HGB 10.5* 10.0*  HCT 32.2* 29.8*  PLT 179 186   BMET:  Recent Labs    01/21/21 0430 01/22/21 0055  NA 134* 134*  K 4.0 4.3  CL 101 103  CO2 27 28  GLUCOSE 151* 183*  BUN 14 17  CREATININE 1.02 0.99  CALCIUM 8.8* 8.6*    PT/INR:  Recent Labs    01/19/21 1316  LABPROT 16.7*  INR 1.4*   ABG    Component Value Date/Time   PHART 7.369 01/19/2021 1802   HCO3 21.9 01/19/2021 1802   TCO2 23 01/19/2021 1802   ACIDBASEDEF 3.0 (H) 01/19/2021 1802   O2SAT 98.0 01/19/2021 1802   CBG (last 3)  Recent Labs    01/21/21 2051 01/22/21 0015 01/22/21 0440  GLUCAP 266* 215* 117*     Assessment/Plan: S/P Procedure(s) (LRB): CORONARY ARTERY BYPASS GRAFTING (CABG) TIMES FOUR, ON PUMP, USING LEFT INTERNAL MAMMARY ARTERY AND ENDOSCOPICALLY HARVESTED GREATER SAPHENOUS VEIN (N/A) TRANSESOPHAGEAL ECHOCARDIOGRAM (TEE) (N/A) APPLICATION OF CELL SAVER ENDOVEIN HARVEST OF GREATER SAPHENOUS VEIN (Right) POD 3 s/p CABG, 2nd AV block  CV-NSR in the 80s-100s, BP well controlled. Added lisinopril for BP control.  Pulm-tolerating room air with good oxygen saturation Creatinine 0.99, electrolytes okay, continue diuretics remains about 3.5kg over baseline weight.  H and H 10.0/29.8, expected acute blood loss anemia Endo-blood glucose with moderate control  Plan: Incision healing well. Encouraged ambulation and use of incentive spirometer. I suspect he will be ready for discharge Sunday home with his wife.     LOS: 5 days    Elgie Collard 01/22/2021

## 2021-01-22 NOTE — TOC Benefit Eligibility Note (Signed)
Transition of Care [facility]) Benefit Eligibility Note    Patient Details  Name: Shane Ford MRN: [id number] Date of Birth: 1945-07-13   Medication/Dose: JARDIANCE  10 MG DAILY  Covered?: Yes  Tier: 3 Drug  Prescription Coverage Preferred Pharmacy: [facility] with Person/Company/Phone Number:: JOSHUA  @ [employer] MF #  [phone]  Co-Pay: $45.00  Prior Approval: No  Deductible: Met (OUT-OF-POCKET:UNMET)       Memory Argue Phone Number: 01/22/2021, 11:52 AM

## 2021-01-22 NOTE — TOC Benefit Eligibility Note (Signed)
Transition of Care [facility]) Benefit Eligibility Note    Patient Details  Name: Shane Ford MRN: [id number] Date of Birth: [birth year]   Medication/Dose: [name]  10 MG  DAILY  Covered?: Yes  Tier:  (TIER- 4 DRUG)  Prescription Coverage Preferred Pharmacy: [facility] with Person/Company/Phone Number:: JOSHUA  @ [employer] PO #  [phone]  CO-PAY: $ 95.00  Prior Approval: No  Deductible: Met (OUT-OF-POCKET:UNMET)       Memory Argue Phone Number: 01/22/2021, 3:18 PM

## 2021-01-22 NOTE — Discharge Summary (Signed)
Physician Discharge Summary  Patient ID: Shane Ford MRN: [id number] DOB/AGE: 1945-07-24 75 y.o.  Admit date: 01/17/2021 Discharge date: 01/23/2021  Admission Diagnoses: Patient Active Problem List   Diagnosis Date Noted   Chest pain at rest 01/17/2021   ST elevation myocardial infarction (STEMI) [facility])    Acute myocardial infarction [facility])      Discharge Diagnoses:  Active Problems:   Chest pain at rest   ST elevation myocardial infarction (STEMI) ([place])   Acute myocardial infarction ([place])   S/P CABG x 4   Discharged Condition: good  Hospital Course:   Referring: Tamala Julian Primary Care: Rusty Aus, MD Primary Cardiologist:Henry Nicholes Stairs III, MD   History of Present Illness:   at time of CT surgery consultation    Shane Ford is a 75 yo male with known history of HTN, Hyperlipidemia, DM Type 2, Dementia, and known CAD with stent to the RCA placed in 1999.  He developed a several day complaint of chest, neck and, arm pain.  He initially presented to the ED about 9 days ago at which time workup was unremarkable.  However on 01/17/2021 the patients chest pain persisted and progressed and was substernal in nature.  He presented to the ED for evaluation with EKG which showed inferior q waves with elevation in II, III, and aVF.  Troponin level was elevated and he was ruled in for STEMI.  He was taken directly to the catheterization lab and found to have severe 3V CAD.  He was started on Heparin and NTG drip.  He was admitted and Cardiothoracic surgery consultation was requested for possible coronary bypass.  Currently the patient is chest pain free.  Overall he is active.  In regards to his Dementia it is in the early stages.  His wife and son state that he mainly has mild forgetfulness to this point.  His diabetes is well controlled with most recent A1c being 7.1.  He doesn't smoke.  The patient and all relevant studies were reviewed by Dr. Kipp Brood he was agreeable to proceed with  coronary artery surgical revascularization.  Hospital course:  Following medical stabilization and preoperative testing the patient was ready to proceed with surgery and on 01/19/2021 he was taken the operating room where he underwent the following procedure: Procedure: CABG X 4, LIMA LAD, reverse saphenous vein graft to posterior lateral branch, OM1, first diagonal. Endoscopic greater saphenous vein harvest on the right  Patient tolerated procedure well and was taken to the surgical intensive care unit in stable condition.  Postoperative hospital course:  The patient was extubated without difficulty using standard post cardiac surgical protocols.  He has remained neurologically intact with good pain control.  He was weaned off intravenous support at all routine lines, monitors and drainage devices have been discontinued in the standard fashion.  He was started on a course of routine pulmonary hygiene.  He did show evidence of postoperative volume overload and was started on a course of diuretics.  He is noted to have a expected acute blood loss anemia and values have stabilized. We were advancing his diet as tolerated. On the floor he continued to progress. We continued his diuretic regimen for fluid overload and encouraged ambulation. He was hypertensive and he was restarted on his home regimen of Lisinopril which was titrated as needed.  His pacing wires were removed without difficulty.  He remains in NSR with 1st AV Block.  He was not started on Lopressor.  This can hopefully be started  later at hospital follow up.  He is ambulating without difficulty.  His surgical incisions are healing without evidence of infection.  He is medically stable for discharge home today.   Significant Diagnostic Studies:   CLINICAL DATA:  Chest tube.  History of pneumothorax.   EXAM: PORTABLE CHEST 1 VIEW   COMPARISON:  01/20/2021.   FINDINGS: Right IJ line in stable position. Left chest tube in  stable position. No pneumothorax. Prior CABG. Stable cardiomegaly. Infiltrate right upper lung cannot be excluded on today's exam. Persistent bibasilar atelectasis/infiltrates, most prominent on the left. Small left pleural effusion.   IMPRESSION: 1. Right IJ line left chest tube in stable position. No pneumothorax.   2. Mild infiltrate right upper lung cannot be excluded on today's exam. Persistent bibasilar atelectasis/infiltrates, most prominent on left. Small left pleural effusion.   3.  Prior CABG.  Stable cardiomegaly.     Electronically Signed   By: Marcello Moores  Register   On: 01/21/2021 07:12    Treatments:   01/19/2021 Patient:  Shane Ford Pre-Op Dx: Left main/three-vessel coronary artery disease.                         NSTEMI                         Diabetes mellitus                         Hypertension Post-op Dx: Same Procedure: CABG X 4, LIMA LAD, reverse saphenous vein graft to posterior lateral branch, OM1, first diagonal. Endoscopic greater saphenous vein harvest on the right     Surgeon and Role:      * [name], Lucile Crater, MD - Primary    Evonnie Pat, PA-C- assisting   Anesthesia  general EBL: 500 ml Blood Administration: None Xclamp Time: 64 min Pump Time: 116 min   Drains: 4F blake drain: L, mediastinal Wires: Ventricular Counts: correct     Indications: This is a 75 year old gentleman is admitted with left main/three-vessel coronary artery disease.  He originally presented with anginal symptoms and was ruled in for STEMI.  He does have a history of coronary artery disease and has had stent placement in the right systems in the past.  Review of his images he does have good distal targets in the LAD, and circumflex distribution.  The stent on the right coronary appears patent but he does have some distal disease in the PDA and posterolateral branch.  His echocardiogram shows preserved RV and LV function and no significant valvular disease.  We  will review of his EKG, he does have a new second-degree block.  We discussed the risks and benefits of surgical revascularization and he is agreeable to proceed.  He is scheduled for 01/19/2021.   Findings: Good LIMA, good vein conduit.  Heavily calcified LAD.  We implanted the LIMA in the midportion to cover the largest territory.  The apex had a tight stenosis that we were unable to probe, but this was a very small distribution.  The PLV was a larger vessel compared to the PDA.  OM1 was a good quality vessel.  First diagonal was also good quality vessel.    Discharge Exam: Blood pressure (!) 156/82, pulse 80, temperature 98.4 F (36.9 C), temperature source Oral, resp. rate 20, height '6\' 3"'$  (1.905 m), weight 92.2 kg, SpO2 98 %.  General appearance: alert, cooperative,  and no distress Heart: regular rate and rhythm Lungs: clear to auscultation bilaterally Abdomen: soft, non-tender; bowel sounds normal; no masses,  no organomegaly Extremities: edema trace Wound: clean and dry  Discharge disposition: 01-Home or Self Care   Allergies as of 01/23/2021   No Known Allergies      Medication List     STOP taking these medications    ibuprofen 200 MG tablet Commonly known as: ADVIL   magnesium oxide 400 MG tablet Commonly known as: MAG-OX   omeprazole 20 MG tablet Commonly known as: PRILOSEC OTC       TAKE these medications    acetaminophen 500 MG tablet Commonly known as: TYLENOL Take 2 tablets (1,000 mg total) by mouth every 6 (six) hours as needed.   aspirin EC 81 MG tablet Take 81 mg by mouth daily. Swallow whole.   cetirizine 10 MG tablet Commonly known as: ZYRTEC Take 10 mg by mouth daily.   donepezil 5 MG tablet Commonly known as: ARICEPT Take 5 mg by mouth at bedtime.   empagliflozin 10 MG Tabs tablet Commonly known as: Jardiance Take 1 tablet (10 mg total) by mouth daily before breakfast.   fenofibrate 160 MG tablet Take 160 mg by mouth daily.   folic  acid 1 MG tablet Commonly known as: FOLVITE Take 1 mg by mouth daily.   furosemide 40 MG tablet Commonly known as: LASIX Take 1 tablet (40 mg total) by mouth daily.   gabapentin 300 MG capsule Commonly known as: NEURONTIN Take 300 mg by mouth 3 (three) times daily.   glimepiride 1 MG tablet Commonly known as: AMARYL Take 2 mg by mouth daily with breakfast.   HYDROcodone-acetaminophen 7.5-325 MG tablet Commonly known as: NORCO Take 1 tablet by mouth every 4 (four) hours.   lisinopril 10 MG tablet Commonly known as: ZESTRIL Take 1 tablet (10 mg total) by mouth daily. What changed:  medication strength how much to take   metFORMIN 500 MG tablet Commonly known as: GLUCOPHAGE Take 500 mg by mouth 2 (two) times daily with a meal.   methotrexate 2.5 MG tablet Commonly known as: RHEUMATREX Take 10 mg by mouth once a week. Caution:Chemotherapy. Protect from light.   polyvinyl alcohol 1.4 % ophthalmic solution Commonly known as: LIQUIFILM TEARS Place 1 drop into both eyes as needed for dry eyes.   potassium chloride SA 20 MEQ tablet Commonly known as: KLOR-CON Take 1 tablet (20 mEq total) by mouth daily.   simvastatin 40 MG tablet Commonly known as: ZOCOR Take 40 mg by mouth daily.   venlafaxine XR 37.5 MG 24 hr capsule Commonly known as: EFFEXOR-XR Take 75 mg by mouth daily with breakfast.   vitamin B-12 1000 MCG tablet Commonly known as: CYANOCOBALAMIN Take 1,000 mcg by mouth daily.        Follow-up Information     [name], Lucile Crater, MD Follow up.   Specialty: Cardiothoracic Surgery Why: Your VIRTUAL appointment is on 8/5 at 3:10pm. Please do not come to the office during this appointment. Contact information: [address] [place]  [address] [phone]         Belva Crome, MD Follow up on 02/02/2021.   Specialty: Cardiology Why: Appointment is at 1:30 Contact information: [address] [place]  [address] [phone]         Rusty Aus, MD. Call today.   Specialty: Internal Medicine Contact information: [place]  [place] [address] [phone]                 The patient has been discharged on:   1.Beta Blocker:  Yes [   ]                              No   [  x ]                              If No, reason: 1st degree block  2.Ace Inhibitor/ARB: Yes [  x ]                                     No  [    ]                                     If No, reason:  3.Statin:   Yes [ x  ]                  No  [   ]                  If No, reason:  4.Ecasa:  Yes  [  x ]                  No   [   ]                  If No, reason:  Patient had ACS upon admission:  Plavix/P2Y12 inhibitor: Yes [   ]                                      No  [ x  ]   Signed:  Nicholes Rough PA-C  Update by: Ellwood Handler, PA-C  01/23/2021, 7:55 AM

## 2021-01-22 NOTE — Progress Notes (Signed)
CARDIAC REHAB PHASE I   PRE:  Rate/Rhythm: 99 SR  BP:  Supine:   Sitting: 160/86  Standing:    SaO2: 95%RA  MODE:  Ambulation: 470 ft   POST:  Rate/Rhythm: 111 ST  BP:  Supine:   Sitting: 154/79  Standing:    SaO2: 90-91%RA 0952-1030 Pt walked 470 ft on RA with rolling walker, gait belt use, rolling walker and asst x 1. Pt did better staying in the middle of hallway and did not go to left as much. Reinforced sternal precautions. To recliner with call bell.  Tolerated well. Pt stated he has rollator and rolling walker at home.   [name], RN BSN  01/22/2021 10:59 AM

## 2021-01-23 LAB — GLUCOSE, CAPILLARY: Glucose-Capillary: 178 mg/dL — ABNORMAL HIGH (ref 70–99)

## 2021-01-23 MED ORDER — LISINOPRIL 10 MG PO TABS: 10.0000 mg | ORAL_TABLET | Freq: Every day | ORAL | 3 refills | Status: DC

## 2021-01-23 MED ORDER — LISINOPRIL 10 MG PO TABS
10.0000 mg | ORAL_TABLET | Freq: Every day | ORAL | Status: DC
  Administered 2021-01-23: 10 mg via ORAL
  Filled 2021-01-23: qty 1

## 2021-01-23 MED ORDER — FUROSEMIDE 40 MG PO TABS: 40.0000 mg | ORAL_TABLET | Freq: Every day | ORAL | 0 refills | Status: DC

## 2021-01-23 MED ORDER — ACETAMINOPHEN 500 MG PO TABS: 1000.0000 mg | ORAL_TABLET | Freq: Four times a day (QID) | ORAL | 0 refills | Status: DC | PRN

## 2021-01-23 MED ORDER — POTASSIUM CHLORIDE CRYS ER 20 MEQ PO TBCR: 20.0000 meq | EXTENDED_RELEASE_TABLET | Freq: Every day | ORAL | 0 refills | Status: DC

## 2021-01-23 MED ORDER — EMPAGLIFLOZIN 10 MG PO TABS: 10.0000 mg | ORAL_TABLET | Freq: Every day | ORAL | 3 refills | Status: DC

## 2021-01-23 NOTE — Progress Notes (Signed)
      [address]       [place] [address]             [phone]      4 Days Post-Op Procedure(s) (LRB): CORONARY ARTERY BYPASS GRAFTING (CABG) TIMES FOUR, ON PUMP, USING LEFT INTERNAL MAMMARY ARTERY AND ENDOSCOPICALLY HARVESTED GREATER SAPHENOUS VEIN (N/A) TRANSESOPHAGEAL ECHOCARDIOGRAM (TEE) (N/A) APPLICATION OF CELL SAVER ENDOVEIN HARVEST OF GREATER SAPHENOUS VEIN (Right)  Subjective:  Sitting up in bed eating breakfast.  Continues to feel well.  Denies pain, shortness of breath.  He feels ready to go home.  + BM  Objective: Vital signs in last 24 hours: Temp:  [97.6 F (36.4 C)-98.4 F (36.9 C)] 98.4 F (36.9 C) (07/30 0321) Pulse Rate:  [80-108] 80 (07/30 0322) Cardiac Rhythm: Heart block;Normal sinus rhythm (07/30 0700) Resp:  [18-20] 20 (07/30 0400) BP: (135-162)/(62-92) 156/82 (07/30 0321) SpO2:  [95 %-99 %] 98 % (07/30 0321) Weight:  [91.1 kg-92.2 kg] 92.2 kg (07/30 0321)  Intake/Output from previous day: 07/29 0701 - 07/30 0700 In: 640 [P.O.:640] Out: 1525 [Urine:1525]   General appearance: alert, cooperative, and no distress Heart: regular rate and rhythm Lungs: clear to auscultation bilaterally Abdomen: soft, non-tender; bowel sounds normal; no masses,  no organomegaly Extremities: edema trace Wound: clean and dry  Lab Results: Recent Labs    01/21/21 0430 01/22/21 0055  WBC 8.0 7.0  HGB 10.5* 10.0*  HCT 32.2* 29.8*  PLT 179 186   BMET:  Recent Labs    01/21/21 0430 01/22/21 0055  NA 134* 134*  K 4.0 4.3  CL 101 103  CO2 27 28  GLUCOSE 151* 183*  BUN 14 17  CREATININE 1.02 0.99  CALCIUM 8.8* 8.6*    PT/INR: No results for input(s): LABPROT, INR in the last 72 hours. ABG    Component Value Date/Time   PHART 7.369 01/19/2021 1802   HCO3 21.9 01/19/2021 1802   TCO2 23 01/19/2021 1802   ACIDBASEDEF 3.0 (H) 01/19/2021 1802   O2SAT 98.0 01/19/2021 1802   CBG (last 3)  Recent Labs    01/22/21 1612 01/22/21 2130  01/23/21 0616  GLUCAP 312* 250* 178*    Assessment/Plan: S/P Procedure(s) (LRB): CORONARY ARTERY BYPASS GRAFTING (CABG) TIMES FOUR, ON PUMP, USING LEFT INTERNAL MAMMARY ARTERY AND ENDOSCOPICALLY HARVESTED GREATER SAPHENOUS VEIN (N/A) TRANSESOPHAGEAL ECHOCARDIOGRAM (TEE) (N/A) APPLICATION OF CELL SAVER ENDOVEIN HARVEST OF GREATER SAPHENOUS VEIN (Right)  CV- NSR with 1st degree AV Block, remains hypertensive- will increase Lisinopril to 10 mg daily Pulm- no acute issues, off oxygen, continue IS Renal- no new labs today, creatinine, K has been stable previously, weight is trending down, continue Lasix DM- A1c was 8.5, sugars have been elevated during hospitalization will continue home agents and add Jardiance  Dispo- patient stable, increase Lisinopril for better control of BP, continue lasix, potassium, start Jardiance for additional DM control, will d/c patient home today   LOS: 6 days   Ellwood Handler, PA-C 01/23/2021

## 2021-01-23 NOTE — Progress Notes (Signed)
Cardiac Rehab [id number] Pt was discharged prior to my arrival. Called pt at home and reached his wife. She wanted me to discuss discharge instructions with her since pt has memory issues. We discussed sternal precautions,heart healthy diabetic diet, exercise guidelines,endpoints for exercise and temperature limits,showers,incision care and Outpt. CRP. Will send referral to [facility] program. She voices understanding. I am mailing this information to pt's home.

## 2021-01-27 ENCOUNTER — Telehealth: Payer: Self-pay

## 2021-01-27 NOTE — Telephone Encounter (Signed)
-----   Message from Elgie Collard, [place] sent at 01/27/2021 11:04 AM EDT ----- Regarding: RE: diarrhea/weakness Contact: [phone] Stop lasix   I would have him call PCP. We can't do anything for him in the office. He might need tested for CDIFF. I would do this first and if CDIFF neg then can take Imodium. Also, would refer to prescribing MD on the gabapentin. If he can't tolerate liquids he needs admitted.   Otherwise he seems stable and there are no surgical issues to address. ----- Message ----- From: Marylen Ponto, LPN Sent: [id number]  [id number] AM EDT To: Elgie Collard, PA-C, Lajuana Matte, MD Subject: diarrhea/weakness                              Mr Shane Ford's wife, Enid Derry) called stating that her husband has had diarrhea x 2 days. He denies any N/V, no fevers, He is eating good, all incision sites look good. His BP today was 116/65 HR 96. No shortness of breath. Only the diarrhea and increased weakness. He might be dehydrated.  I did recommend that he start Imodium and Gatorade and to increase his fluids. He is currently taking lasix for 7 days. His weight was 195. Today it's 184. He is only taking the Tylenol to manage his pain. He would like to increase the Neurontin 300 mg  to TID, his hospital discharge dropped him back to once in the AM. He takes this for his vertebra in neck. And really "needs it" He does have a Virtual with you Friday. But was needing some advise on the diarrhea and weakness and Neurontin. Thanks SW/ please advise

## 2021-01-28 DIAGNOSIS — E1165 Type 2 diabetes mellitus with hyperglycemia: Secondary | ICD-10-CM | POA: Diagnosis not present

## 2021-01-28 DIAGNOSIS — Z951 Presence of aortocoronary bypass graft: Secondary | ICD-10-CM | POA: Diagnosis not present

## 2021-01-28 DIAGNOSIS — I214 Non-ST elevation (NSTEMI) myocardial infarction: Secondary | ICD-10-CM | POA: Diagnosis not present

## 2021-01-29 ENCOUNTER — Other Ambulatory Visit: Payer: Self-pay

## 2021-01-29 ENCOUNTER — Telehealth (INDEPENDENT_AMBULATORY_CARE_PROVIDER_SITE_OTHER): Payer: Self-pay | Admitting: Thoracic Surgery (Cardiothoracic Vascular Surgery)

## 2021-01-29 DIAGNOSIS — Z951 Presence of aortocoronary bypass graft: Secondary | ICD-10-CM

## 2021-01-29 NOTE — Progress Notes (Signed)
     [address]       [place] [address]             [phone]       Patient: Home Provider: Office Consent for Telemedicine visit obtained.  Today's visit was completed via a real-time telehealth (see specific modality noted below). The patient/authorized person provided oral consent at the time of the visit to engage in a telemedicine encounter with the present provider at [facility]. The patient/authorized person was informed of the potential benefits, limitations, and risks of telemedicine. The patient/authorized person expressed understanding that the laws that protect confidentiality also apply to telemedicine. The patient/authorized person acknowledged understanding that telemedicine does not provide emergency services and that he or she would need to call 911 or proceed to the nearest hospital for help if such a need arose.   Total time spent in the clinical discussion 10 minutes.  Telehealth Modality: Phone visit (audio only)  I had a telephone visit with Shane Ford.  He is doing well.  He denies any pain.  He has a poor appetite, but is making adjusted.  He is walking and taking 4 walks daily 6 minutes.

## 2021-02-01 ENCOUNTER — Telehealth (HOSPITAL_COMMUNITY): Payer: Self-pay

## 2021-02-01 NOTE — Telephone Encounter (Signed)
Pt insurance is active and benefits verified through [facility]. Co-pay $10.00, DED $0.00/$0.00 met, out of pocket $3,900.00/$870.00 met, co-insurance 0%. No pre-authorization required. Passport, 02/01/21 @ 2:51PM, JFH#[id number]   Will contact patient to see if he is interested in the Cardiac Rehab Program. If interested, patient will need to complete follow up appt. Once completed, patient will be contacted for scheduling upon review by the RN Navigator.

## 2021-02-01 NOTE — Telephone Encounter (Signed)
Called patient to see if he is interested in the Cardiac Rehab Program. Patient expressed interest. Explained scheduling process and went over insurance, patient verbalized understanding. Will contact patient for scheduling once f/u has been completed.  °

## 2021-02-01 NOTE — Progress Notes (Signed)
Cardiology Office Note:    Date:  02/02/2021   ID:  Shane Ford, DOB 1946/02/13, MRN [id number]  PCP:  Shane Aus, MD  Cardiologist:  Shane Grooms, MD   Referring MD: Shane Aus, MD   Chief Complaint  Patient presents with   Coronary Artery Disease    CABG x4 with LIMA to LAD, SVG to OM, SVG to PL, and SVG to diagonal #1    History of Present Illness:    Shane Ford is a 75 y.o. male with a hx of hypertension, early dementia, primary hypertension, hyperlipidemia, diabetes mellitus type 2, and ST elevation MI related to occlusion of LV branch of RCA that was too far distal and too small to treat interventionally.  Successful four-vessel bypass with LIMA to LAD, SVG to PL, SVG to OM, SVG to diagonal 1 (Shane Ford)- 01/19/2021.   Shane Ford is accompanied by his wife.  He presented with acute inferior ST elevation MI related to complete occlusion of the first left ventricular branch of the right coronary.  There is too far distal and relatively small to intervene upon.  He otherwise has significant three-vessel coronary disease including significant left main and underwent coronary bypass grafting by Dr. Kipp Ford on 01/19/2021.  The patient was discharged from the hospital along with fifth day post bypass.  He has done relatively well.  He has some lightheadedness and dizziness when he stands.  Furosemide that was given at discharge was discontinued because of orthostatic dizziness.  He has been off that medication now for approximately 3 days.  He feels a little stronger but still has orthostatic dizziness.  Appetite has been adequate.  No bleeding or oozing from incision sites including the sternum being dry and without disproportionate pain.  Past Medical History:  Diagnosis Date   Diabetes mellitus ([place])    Hyperlipidemia    Hypertension     Past Surgical History:  Procedure Laterality Date   CORONARY ARTERY BYPASS GRAFT N/A 01/19/2021   Procedure: CORONARY  ARTERY BYPASS GRAFTING (CABG) TIMES FOUR, ON PUMP, USING LEFT INTERNAL MAMMARY ARTERY AND ENDOSCOPICALLY HARVESTED GREATER SAPHENOUS VEIN;  Surgeon: Shane Matte, MD;  Location: [facility];  Service: Open Heart Surgery;  Laterality: N/A;  flow trac   ENDOVEIN HARVEST OF GREATER SAPHENOUS VEIN Right 01/19/2021   Procedure: ENDOVEIN HARVEST OF GREATER SAPHENOUS VEIN;  Surgeon: Shane Matte, MD;  Location: [facility];  Service: Open Heart Surgery;  Laterality: Right;   LEFT HEART CATH AND CORONARY ANGIOGRAPHY N/A 01/17/2021   Procedure: LEFT HEART CATH AND CORONARY ANGIOGRAPHY;  Surgeon: Shane Crome, MD;  Location: [facility];  Service: Cardiovascular;  Laterality: N/A;   TEE WITHOUT CARDIOVERSION N/A 01/19/2021   Procedure: TRANSESOPHAGEAL ECHOCARDIOGRAM (TEE);  Surgeon: Shane Matte, MD;  Location: [facility];  Service: Open Heart Surgery;  Laterality: N/A;    Current Medications: Current Meds  Medication Sig   acetaminophen (TYLENOL) 500 MG tablet Take 2 tablets (1,000 mg total) by mouth every 6 (six) hours as needed.   aspirin EC 81 MG tablet Take 81 mg by mouth daily. Swallow whole.   donepezil (ARICEPT) 5 MG tablet Take 5 mg by mouth at bedtime.   empagliflozin (JARDIANCE) 10 MG TABS tablet Take 1 tablet (10 mg total) by mouth daily before breakfast.   fenofibrate 160 MG tablet Take 160 mg by mouth daily.   folic acid (FOLVITE) 1 MG tablet Take 1 mg by mouth daily.  gabapentin (NEURONTIN) 300 MG capsule Take 300 mg by mouth 3 (three) times daily.   glimepiride (AMARYL) 1 MG tablet Take 2 mg by mouth daily with breakfast.   HYDROcodone-acetaminophen (NORCO) 7.5-325 MG tablet Take 1 tablet by mouth as needed.   lisinopril (ZESTRIL) 5 MG tablet Take 1 tablet (5 mg total) by mouth daily.   metFORMIN (GLUCOPHAGE) 500 MG tablet Take 500 mg by mouth 2 (two) times daily with a meal.   methotrexate (RHEUMATREX) 2.5 MG tablet Take 10 mg by mouth once a week. Caution:Chemotherapy.  Protect from light.   polyvinyl alcohol (LIQUIFILM TEARS) 1.4 % ophthalmic solution Place 1 drop into both eyes as needed for dry eyes.   simvastatin (ZOCOR) 40 MG tablet Take 40 mg by mouth daily.   venlafaxine XR (EFFEXOR-XR) 37.5 MG 24 hr capsule Take 75 mg by mouth daily with breakfast.   vitamin B-12 (CYANOCOBALAMIN) 1000 MCG tablet Take 1,000 mcg by mouth daily.   [DISCONTINUED] lisinopril (ZESTRIL) 10 MG tablet Take 1 tablet (10 mg total) by mouth daily.     Allergies:   Patient has no known allergies.   Social History   Socioeconomic History   Marital status: Married    Spouse name: Not on file   Number of children: Not on file   Years of education: Not on file   Highest education level: Not on file  Occupational History   Not on file  Tobacco Use   Smoking status: Never   Smokeless tobacco: Never  Substance and Sexual Activity   Alcohol use: Never   Drug use: Never   Sexual activity: Not on file  Other Topics Concern   Not on file  Social History Narrative   Not on file   Social Determinants of Health   Financial Resource Strain: Not on file  Food Insecurity: Not on file  Transportation Needs: Not on file  Physical Activity: Not on file  Stress: Not on file  Social Connections: Not on file     Family History: The patient's family history is not on file.  ROS:   Please see the history of present illness.    Some difficulty with his memory.  Having difficulty with pain in his neck related to cervical disc disease.  He wants to have a steroid injection but I have advised against that until he is at least 6 weeks out from surgery.  Sinus tachycardia 105 bpm, biatrial abnormality, inferior Q waves, with left anterior hemiblock.  All other systems reviewed and are negative.  EKGs/Labs/Other Studies Reviewed:    The following studies were reviewed today: 2D Doppler echocardiogram 01/18/2021: IMPRESSIONS     1. Left ventricular ejection fraction, by estimation,  is 55 to 60%. The  left ventricle has normal function. Left ventricular endocardial border  not optimally defined to evaluate regional wall motion. There is moderate  left ventricular hypertrophy. Left  ventricular diastolic parameters are consistent with Grade I diastolic  dysfunction (impaired relaxation).   2. Right ventricular systolic function is normal. The right ventricular  size is normal. Tricuspid regurgitation signal is inadequate for assessing  PA pressure.   3. The mitral valve is normal in structure. No evidence of mitral valve  regurgitation. No evidence of mitral stenosis.   4. The aortic valve is tricuspid. Aortic valve regurgitation is not  visualized. No aortic stenosis is present.   5. The inferior vena cava is normal in size with greater than 50%  respiratory variability, suggesting right atrial pressure  of 3 mmHg.   6. Technically difficult study with poor acoustic windows.   EKG:  EKG is sinus tachycardia 105 bpm, left axis deviation, old inferior infarct.  Normal PR interval.  Recent Labs: 01/19/2021: ALT 19 01/20/2021: Magnesium 2.2 01/22/2021: BUN 17; Creatinine, Ser 0.99; Hemoglobin 10.0; Platelets 186; Potassium 4.3; Sodium 134  Recent Lipid Panel    Component Value Date/Time   CHOL 156 01/17/2021 2325   TRIG 198 (H) 01/17/2021 2325   HDL 45 01/17/2021 2325   CHOLHDL 3.5 01/17/2021 2325   VLDL 40 01/17/2021 2325   LDLCALC 71 01/17/2021 2325    Physical Exam:    VS:  BP 98/72   Pulse (!) 102   Ht '6\' 3"'$  (1.905 m)   Wt 185 lb 8 oz (84.1 kg)   SpO2 98%   BMI 23.19 kg/m     Wt Readings from Last 3 Encounters:  02/02/21 185 lb 8 oz (84.1 kg)  01/23/21 203 lb 4.2 oz (92.2 kg)    Blood pressure sitting 120/64 heart rate 100 increasing to heart rate of 110 with standing and blood pressure 100/70 mmHg. GEN: Slender, pale but not frail.. No acute distress HEENT: Normal NECK: No JVD. LYMPHATICS: No lymphadenopathy CARDIAC: No murmur. RRR S4 gallop, or  edema. VASCULAR:  Normal Pulses. No bruits. RESPIRATORY:  Clear to auscultation without rales, wheezing or rhonchi  ABDOMEN: Soft, non-tender, non-distended, No pulsatile mass, MUSCULOSKELETAL: No deformity  SKIN: Warm and dry NEUROLOGIC:  Alert and oriented x 3 PSYCHIATRIC:  Normal affect   ASSESSMENT:    1. Orthostasis   2. S/P CABG x 4   3. ST elevation myocardial infarction (STEMI), unspecified artery ([place])   4. Type 2 diabetes mellitus with complication, without long-term current use of insulin ([place])   5. SOB (shortness of breath)   6. Chest pain at rest   7. Primary hypertension    PLAN:    In order of problems listed above:  Decrease lisinopril to 5 mg/day.  Basic metabolic panel was obtained to exclude acute kidney injury and dehydration. Status post four-vessel coronary bypass surgery as outlined above.  No recurrence of angina. Will need to assess residual LV function perhaps after the next visit in 1 month.  Beta-blocker therapy seems like a good idea but until his blood pressure becomes more stable we will simply observe and let him heal a little more. Consider SGLT2 therapy BNP is obtained Musculoskeletal related to sternotomy. Continue to monitor blood pressures at home.   Medication Adjustments/Labs and Tests Ordered: Current medicines are reviewed at length with the patient today.  Concerns regarding medicines are outlined above.  Orders Placed This Encounter  Procedures   Basic metabolic panel   Pro b natriuretic peptide   EKG 12-Lead   Meds ordered this encounter  Medications   lisinopril (ZESTRIL) 5 MG tablet    Sig: Take 1 tablet (5 mg total) by mouth daily.    Dispense:  90 tablet    Refill:  3    Dose change    Patient Instructions  Medication Instructions:  1) DECREASE Lisinopril to '5mg'$  once daily  *If you need a refill on your cardiac medications before your next appointment, please call your pharmacy*   Lab Work: BMET and Pro BNP  today  If you have labs (blood work) drawn today and your tests are completely normal, you will receive your results only by: [place] (if you have MyChart) OR A paper copy in the mail  If you have any lab test that is abnormal or we need to change your treatment, we will call you to review the results.   Testing/Procedures: None   Follow-Up: At [facility], you and your health needs are our priority.  As part of our continuing mission to provide you with exceptional heart care, we have created designated Provider Care Teams.  These Care Teams include your primary Cardiologist (physician) and Advanced Practice Providers (APPs -  Physician Assistants and Nurse Practitioners) who all work together to provide you with the care you need, when you need it.  We recommend signing up for the patient portal called "MyChart".  Sign up information is provided on this After Visit Summary.  MyChart is used to connect with patients for Virtual Visits (Telemedicine).  Patients are able to view lab/test results, encounter notes, upcoming appointments, etc.  Non-urgent messages can be sent to your provider as well.   To learn more about what you can do with MyChart, go to [email or web address].    Your next appointment:   4 week(s)  The format for your next appointment:   In Person  Provider:   You may see Shane Grooms, MD or one of the following Advanced Practice Providers on your designated Care Team:   Cecilie Kicks, NP   Other Instructions     Signed, Shane Grooms, MD  02/02/2021 2:11 PM    [place]

## 2021-02-02 ENCOUNTER — Ambulatory Visit: Payer: Medicare HMO | Admitting: Interventional Cardiology

## 2021-02-02 ENCOUNTER — Encounter: Payer: Self-pay | Admitting: Interventional Cardiology

## 2021-02-02 ENCOUNTER — Other Ambulatory Visit: Payer: Self-pay

## 2021-02-02 VITALS — BP 98/72 | HR 102 | Ht 75.0 in | Wt 185.5 lb

## 2021-02-02 DIAGNOSIS — E118 Type 2 diabetes mellitus with unspecified complications: Secondary | ICD-10-CM | POA: Diagnosis not present

## 2021-02-02 DIAGNOSIS — Z951 Presence of aortocoronary bypass graft: Secondary | ICD-10-CM | POA: Diagnosis not present

## 2021-02-02 DIAGNOSIS — I213 ST elevation (STEMI) myocardial infarction of unspecified site: Secondary | ICD-10-CM

## 2021-02-02 DIAGNOSIS — I10 Essential (primary) hypertension: Secondary | ICD-10-CM | POA: Diagnosis not present

## 2021-02-02 DIAGNOSIS — R0602 Shortness of breath: Secondary | ICD-10-CM | POA: Diagnosis not present

## 2021-02-02 DIAGNOSIS — R079 Chest pain, unspecified: Secondary | ICD-10-CM

## 2021-02-02 DIAGNOSIS — I951 Orthostatic hypotension: Secondary | ICD-10-CM | POA: Diagnosis not present

## 2021-02-02 MED ORDER — LISINOPRIL 5 MG PO TABS: 5.0000 mg | ORAL_TABLET | Freq: Every day | ORAL | 3 refills | Status: DC

## 2021-02-02 NOTE — Patient Instructions (Signed)
Medication Instructions:  1) DECREASE Lisinopril to '5mg'$  once daily  *If you need a refill on your cardiac medications before your next appointment, please call your pharmacy*   Lab Work: BMET and Pro BNP today  If you have labs (blood work) drawn today and your tests are completely normal, you will receive your results only by: [place] (if you have MyChart) OR A paper copy in the mail If you have any lab test that is abnormal or we need to change your treatment, we will call you to review the results.   Testing/Procedures: None   Follow-Up: At [facility], you and your health needs are our priority.  As part of our continuing mission to provide you with exceptional heart care, we have created designated Provider Care Teams.  These Care Teams include your primary Cardiologist (physician) and Advanced Practice Providers (APPs -  Physician Assistants and Nurse Practitioners) who all work together to provide you with the care you need, when you need it.  We recommend signing up for the patient portal called "MyChart".  Sign up information is provided on this After Visit Summary.  MyChart is used to connect with patients for Virtual Visits (Telemedicine).  Patients are able to view lab/test results, encounter notes, upcoming appointments, etc.  Non-urgent messages can be sent to your provider as well.   To learn more about what you can do with MyChart, go to [email or web address].    Your next appointment:   4 week(s)  The format for your next appointment:   In Person  Provider:   You may see Sinclair Grooms, MD or one of the following Advanced Practice Providers on your designated Care Team:   Cecilie Kicks, NP   Other Instructions

## 2021-02-03 ENCOUNTER — Telehealth: Payer: Self-pay | Admitting: *Deleted

## 2021-02-03 DIAGNOSIS — R0602 Shortness of breath: Secondary | ICD-10-CM

## 2021-02-03 LAB — BASIC METABOLIC PANEL
BUN/Creatinine Ratio: 19 (ref 10–24)
BUN: 20 mg/dL (ref 8–27)
CO2: 22 mmol/L (ref 20–29)
Calcium: 10.7 mg/dL — ABNORMAL HIGH (ref 8.6–10.2)
Chloride: 101 mmol/L (ref 96–106)
Creatinine, Ser: 1.04 mg/dL (ref 0.76–1.27)
Glucose: 200 mg/dL — ABNORMAL HIGH (ref 65–99)
Potassium: 5.4 mmol/L — ABNORMAL HIGH (ref 3.5–5.2)
Sodium: 135 mmol/L (ref 134–144)
eGFR: 75 mL/min/{1.73_m2} (ref >59)

## 2021-02-03 LAB — PRO B NATRIURETIC PEPTIDE: NT-Pro BNP: 888 pg/mL — ABNORMAL HIGH (ref 0–486)

## 2021-02-03 MED ORDER — HYDROCHLOROTHIAZIDE 12.5 MG PO CAPS: 12.5000 mg | ORAL_CAPSULE | Freq: Every day | ORAL | 3 refills | Status: DC

## 2021-02-03 NOTE — Telephone Encounter (Signed)
Spoke with pt and reviewed results and recommendations per Dr. Tamala Julian.  Pt agreeable to plan.  He will come for labs on 8/19.

## 2021-02-03 NOTE — Telephone Encounter (Signed)
-----   Message from Belva Crome, MD sent at 02/03/2021  8:46 AM EDT ----- Let the patient know that BNP is elevated suggesting that some of the shortness of breath may be from volume overload.  Start HCTZ 12.5 mg/day. A copy will be sent to Rusty Aus, MD

## 2021-02-12 ENCOUNTER — Other Ambulatory Visit: Payer: Medicare HMO | Admitting: *Deleted

## 2021-02-12 ENCOUNTER — Other Ambulatory Visit: Payer: Self-pay

## 2021-02-12 DIAGNOSIS — R0602 Shortness of breath: Secondary | ICD-10-CM | POA: Diagnosis not present

## 2021-02-12 LAB — BASIC METABOLIC PANEL
BUN/Creatinine Ratio: 13 (ref 10–24)
BUN: 15 mg/dL (ref 8–27)
CO2: 24 mmol/L (ref 20–29)
Calcium: 9.6 mg/dL (ref 8.6–10.2)
Chloride: 99 mmol/L (ref 96–106)
Creatinine, Ser: 1.12 mg/dL (ref 0.76–1.27)
Glucose: 200 mg/dL — ABNORMAL HIGH (ref 65–99)
Potassium: 4.2 mmol/L (ref 3.5–5.2)
Sodium: 138 mmol/L (ref 134–144)
eGFR: 69 mL/min/{1.73_m2} (ref >59)

## 2021-02-19 ENCOUNTER — Encounter: Payer: Medicare HMO | Admitting: Thoracic Surgery (Cardiothoracic Vascular Surgery)

## 2021-02-25 ENCOUNTER — Other Ambulatory Visit: Payer: Self-pay | Admitting: Thoracic Surgery (Cardiothoracic Vascular Surgery)

## 2021-02-25 DIAGNOSIS — Z951 Presence of aortocoronary bypass graft: Secondary | ICD-10-CM

## 2021-02-26 ENCOUNTER — Encounter: Payer: Self-pay | Admitting: Thoracic Surgery (Cardiothoracic Vascular Surgery)

## 2021-02-26 ENCOUNTER — Ambulatory Visit (INDEPENDENT_AMBULATORY_CARE_PROVIDER_SITE_OTHER): Payer: Self-pay | Admitting: Thoracic Surgery (Cardiothoracic Vascular Surgery)

## 2021-02-26 ENCOUNTER — Ambulatory Visit
Admission: RE | Admit: 2021-02-26 | Discharge: 2021-02-26 | Disposition: A | Discharge status: Home / Self Care | Payer: Medicare HMO | Source: Ambulatory Visit | Attending: Thoracic Surgery (Cardiothoracic Vascular Surgery) | Admitting: Thoracic Surgery (Cardiothoracic Vascular Surgery)

## 2021-02-26 ENCOUNTER — Other Ambulatory Visit: Payer: Self-pay

## 2021-02-26 VITALS — BP 137/88 | HR 90 | Resp 20 | Ht 75.0 in | Wt 183.0 lb

## 2021-02-26 DIAGNOSIS — Z951 Presence of aortocoronary bypass graft: Secondary | ICD-10-CM | POA: Diagnosis not present

## 2021-02-26 NOTE — Progress Notes (Signed)
      [address]       [place] [address]             [phone]        Shane Ford [place] Medical Record [id number] Date of Birth: [birth year]  Referring: Doyne Keel., MD Primary Care: Rusty Aus, MD Primary Cardiologist:Henry Carlye Grippe, MD  Reason for visit:   follow-up  History of Present Illness:     [name] comes in for his 1 month follow-up 1.  Overall he is doing well.  He denies any chest pain or shortness of breath.  He is ambulating 15 minutes 3 times daily.  Physical Exam: BP 137/88   Pulse 90   Resp 20   Ht '6\' 3"'$  (1.905 m)   Wt 183 lb (83 kg)   SpO2 98% Comment: RA  BMI 22.87 kg/m   Alert NAD Incision: there is a small area where the skin is, part.  It measures less than a centimeter.  There is no erythema or pain to the area..  Sternum stable Abdomen soft, ND No peripheral edema   Diagnostic Studies & Laboratory data: CXR: Clear     Assessment / Plan:   75 year old male status post CABG.  Currently doing well.  He is cleared from cardiac rehab.  In regards to his incision no concern for infection.  The skin is discomfort slightly.  He is taking good care of it.  He states that it opened up after the scab was removed.  Patient will follow up as needed   Shane Ford 02/26/2021 12:23 PM

## 2021-03-08 NOTE — Progress Notes (Signed)
Cardiology Office Note:    Date:  03/09/2021   ID:  Shane Ford, DOB 02-22-1946, MRN [id number]  PCP:  Rusty Aus, MD  Cardiologist:  Sinclair Grooms, MD   Referring MD: Rusty Aus, MD   Chief Complaint  Patient presents with   Coronary Artery Disease    History of Present Illness:    Shane Ford is a 75 y.o. male with a hx of  hypertension, early dementia, primary hypertension, hyperlipidemia, diabetes mellitus type 2, and ST elevation MI related to occlusion of LV branch of RCA that was too far distal and too small to treat interventionally.  Successful four-vessel bypass with LIMA to LAD, SVG to PL, SVG to OM, SVG to diagonal 1 ([name])- 01/19/2021.  On most recent visit was orthostatic and medication adjustments were made.  He is doing better.  Orthostatic dizziness is improved.  His strength is improving.  Appetite is back to normal and food tastes normal.  He is not having chest pain.  He denies orthopnea, PND, and edema.  Past Medical History:  Diagnosis Date   Diabetes mellitus ([place])    Hyperlipidemia    Hypertension     Past Surgical History:  Procedure Laterality Date   CORONARY ARTERY BYPASS GRAFT N/A 01/19/2021   Procedure: CORONARY ARTERY BYPASS GRAFTING (CABG) TIMES FOUR, ON PUMP, USING LEFT INTERNAL MAMMARY ARTERY AND ENDOSCOPICALLY HARVESTED GREATER SAPHENOUS VEIN;  Surgeon: Lajuana Matte, MD;  Location: [place];  Service: Open Heart Surgery;  Laterality: N/A;  flow trac   ENDOVEIN HARVEST OF GREATER SAPHENOUS VEIN Right 01/19/2021   Procedure: ENDOVEIN HARVEST OF GREATER SAPHENOUS VEIN;  Surgeon: Lajuana Matte, MD;  Location: [place];  Service: Open Heart Surgery;  Laterality: Right;   LEFT HEART CATH AND CORONARY ANGIOGRAPHY N/A 01/17/2021   Procedure: LEFT HEART CATH AND CORONARY ANGIOGRAPHY;  Surgeon: Belva Crome, MD;  Location: [place] [facility];  Service: Cardiovascular;  Laterality: N/A;   TEE WITHOUT CARDIOVERSION N/A  01/19/2021   Procedure: TRANSESOPHAGEAL ECHOCARDIOGRAM (TEE);  Surgeon: Lajuana Matte, MD;  Location: [place];  Service: Open Heart Surgery;  Laterality: N/A;    Current Medications: Current Meds  Medication Sig   acetaminophen (TYLENOL) 500 MG tablet Take 2 tablets (1,000 mg total) by mouth every 6 (six) hours as needed.   aspirin EC 81 MG tablet Take 81 mg by mouth daily. Swallow whole.   Cholecalciferol 50 MCG (2000 UT) CAPS Take 2,000 Units by mouth daily.   donepezil (ARICEPT) 5 MG tablet Take 5 mg by mouth at bedtime.   empagliflozin (JARDIANCE) 10 MG TABS tablet Take 1 tablet (10 mg total) by mouth daily before breakfast.   fenofibrate 160 MG tablet Take 160 mg by mouth daily.   folic acid (FOLVITE) 1 MG tablet Take 1 mg by mouth daily.   gabapentin (NEURONTIN) 300 MG capsule Take 300 mg by mouth 3 (three) times daily.   glimepiride (AMARYL) 1 MG tablet Take 2 mg by mouth daily with breakfast.   hydrochlorothiazide (MICROZIDE) 12.5 MG capsule Take 1 capsule (12.5 mg total) by mouth daily.   metFORMIN (GLUCOPHAGE) 500 MG tablet Take 500 mg by mouth 2 (two) times daily with a meal.   methotrexate (RHEUMATREX) 2.5 MG tablet Take 10 mg by mouth once a week. Caution:Chemotherapy. Protect from light.   simvastatin (ZOCOR) 40 MG tablet Take 40 mg by mouth daily.   venlafaxine XR (EFFEXOR-XR) 37.5 MG 24 hr capsule Take  75 mg by mouth daily with breakfast.   vitamin B-12 (CYANOCOBALAMIN) 1000 MCG tablet Take 1,000 mcg by mouth daily.     Allergies:   Patient has no known allergies.   Social History   Socioeconomic History   Marital status: Married    Spouse name: Not on file   Number of children: Not on file   Years of education: Not on file   Highest education level: Not on file  Occupational History   Not on file  Tobacco Use   Smoking status: Never   Smokeless tobacco: Never  Substance and Sexual Activity   Alcohol use: Never   Drug use: Never   Sexual activity: Not on  file  Other Topics Concern   Not on file  Social History Narrative   Not on file   Social Determinants of Health   Financial Resource Strain: Not on file  Food Insecurity: Not on file  Transportation Needs: Not on file  Physical Activity: Not on file  Stress: Not on file  Social Connections: Not on file     Family History: The patient's family history is not on file.  ROS:   Please see the history of present illness.    Having difficulty with his neck, left shoulder, and left arm and hand related to cervical disc disease.  May need to have a two-level fusion.  At this point he will be cleared from the standpoint of his heart if surgery on his neck is needed.  All other systems reviewed and are negative.  EKGs/Labs/Other Studies Reviewed:    The following studies were reviewed today: Coronary angiography January 17, 2021: Diagnostic Dominance: Right Intervention  EKG:  EKG not repeated  Recent Labs: 01/19/2021: ALT 19 01/20/2021: Magnesium 2.2 01/22/2021: Hemoglobin 10.0; Platelets 186 02/02/2021: NT-Pro BNP 888 02/12/2021: BUN 15; Creatinine, Ser 1.12; Potassium 4.2; Sodium 138  Recent Lipid Panel    Component Value Date/Time   CHOL 156 01/17/2021 2325   TRIG 198 (H) 01/17/2021 2325   HDL 45 01/17/2021 2325   CHOLHDL 3.5 01/17/2021 2325   VLDL 40 01/17/2021 2325   LDLCALC 71 01/17/2021 2325    Physical Exam:    VS:  BP 118/78   Pulse (!) 50   Ht '6\' 2"'$  (1.88 m)   Wt 184 lb 12.8 oz (83.8 kg)   SpO2 95%   BMI 23.73 kg/m     Wt Readings from Last 3 Encounters:  03/09/21 184 lb 12.8 oz (83.8 kg)  02/26/21 183 lb (83 kg)  02/02/21 185 lb 8 oz (84.1 kg)     GEN: Slender. No acute distress HEENT: Normal NECK: No JVD. LYMPHATICS: No lymphadenopathy CARDIAC: No murmur. RRR S4 but no S3 gallop, or edema. VASCULAR:  Normal Pulses. No bruits. RESPIRATORY:  Clear to auscultation without rales, wheezing or rhonchi  ABDOMEN: Soft, non-tender, non-distended, No  pulsatile mass, MUSCULOSKELETAL: No deformity  SKIN: Warm and dry NEUROLOGIC:  Alert and oriented x 3 PSYCHIATRIC:  Normal affect   ASSESSMENT:    1. S/P CABG x 4   2. Primary hypertension   3. Type 2 diabetes mellitus with complication, without long-term current use of insulin ([place])   4. Orthostasis   5. Hyperlipidemia LDL goal <70    PLAN:    In order of problems listed above:  Now doing quite well post bypass surgery.  Good discussion concerning secondary prevention as noted below.  Major target now is getting hemoglobin A1c less than 7.  [name]  was started during his hospital stay.  We discussed diet. Continue Microzide 12.5 mg/day. Continue empagliflozin, Glucophage, Amaryl, and diet. Resolved off lisinopril.  Once he is more stable, we need to reinstitute ACE or ARB for renal protection given his diabetes Continue Zocor 40 mg/day.   Overall education and awareness concerning primary/secondary risk prevention was discussed in detail: LDL less than 70, hemoglobin A1c less than 7, blood pressure target less than 130/80 mmHg, >150 minutes of moderate aerobic activity per week, avoidance of smoking, weight control (via diet and exercise), and continued surveillance/management of/for obstructive sleep apnea.  If cervical disc surgery is needed, he is now at a point where he would be cleared from cardiac standpoint   Medication Adjustments/Labs and Tests Ordered: Current medicines are reviewed at length with the patient today.  Concerns regarding medicines are outlined above.  No orders of the defined types were placed in this encounter.  No orders of the defined types were placed in this encounter.   Patient Instructions  Medication Instructions:  Your physician recommends that you continue on your current medications as directed. Please refer to the Current Medication list given to you today.  *If you need a refill on your cardiac medications before your next appointment,  please call your pharmacy*   Lab Work: None If you have labs (blood work) drawn today and your tests are completely normal, you will receive your results only by: [place] (if you have MyChart) OR A paper copy in the mail If you have any lab test that is abnormal or we need to change your treatment, we will call you to review the results.   Testing/Procedures: None   Follow-Up: At [facility], you and your health needs are our priority.  As part of our continuing mission to provide you with exceptional heart care, we have created designated Provider Care Teams.  These Care Teams include your primary Cardiologist (physician) and Advanced Practice Providers (APPs -  Physician Assistants and Nurse Practitioners) who all work together to provide you with the care you need, when you need it.  We recommend signing up for the patient portal called "MyChart".  Sign up information is provided on this After Visit Summary.  MyChart is used to connect with patients for Virtual Visits (Telemedicine).  Patients are able to view lab/test results, encounter notes, upcoming appointments, etc.  Non-urgent messages can be sent to your provider as well.   To learn more about what you can do with MyChart, go to [email or web address].    Your next appointment:   3-4 month(s)  The format for your next appointment:   In Person  Provider:   You may see Sinclair Grooms, MD or one of the following Advanced Practice Providers on your designated Care Team:   Cecilie Kicks, NP   Other Instructions     Signed, Sinclair Grooms, MD  03/09/2021 2:49 PM    [place]

## 2021-03-09 ENCOUNTER — Ambulatory Visit: Payer: Medicare HMO | Admitting: Interventional Cardiology

## 2021-03-09 ENCOUNTER — Other Ambulatory Visit: Payer: Self-pay

## 2021-03-09 ENCOUNTER — Encounter: Payer: Self-pay | Admitting: Interventional Cardiology

## 2021-03-09 VITALS — BP 118/78 | HR 50 | Ht 74.0 in | Wt 184.8 lb

## 2021-03-09 DIAGNOSIS — E118 Type 2 diabetes mellitus with unspecified complications: Secondary | ICD-10-CM | POA: Diagnosis not present

## 2021-03-09 DIAGNOSIS — E785 Hyperlipidemia, unspecified: Secondary | ICD-10-CM | POA: Diagnosis not present

## 2021-03-09 DIAGNOSIS — I10 Essential (primary) hypertension: Secondary | ICD-10-CM

## 2021-03-09 DIAGNOSIS — I951 Orthostatic hypotension: Secondary | ICD-10-CM

## 2021-03-09 DIAGNOSIS — Z951 Presence of aortocoronary bypass graft: Secondary | ICD-10-CM | POA: Diagnosis not present

## 2021-03-09 NOTE — Patient Instructions (Signed)
Medication Instructions:  Your physician recommends that you continue on your current medications as directed. Please refer to the Current Medication list given to you today.  *If you need a refill on your cardiac medications before your next appointment, please call your pharmacy*   Lab Work: None If you have labs (blood work) drawn today and your tests are completely normal, you will receive your results only by: [place] (if you have MyChart) OR A paper copy in the mail If you have any lab test that is abnormal or we need to change your treatment, we will call you to review the results.   Testing/Procedures: None   Follow-Up: At [facility], you and your health needs are our priority.  As part of our continuing mission to provide you with exceptional heart care, we have created designated Provider Care Teams.  These Care Teams include your primary Cardiologist (physician) and Advanced Practice Providers (APPs -  Physician Assistants and Nurse Practitioners) who all work together to provide you with the care you need, when you need it.  We recommend signing up for the patient portal called "MyChart".  Sign up information is provided on this After Visit Summary.  MyChart is used to connect with patients for Virtual Visits (Telemedicine).  Patients are able to view lab/test results, encounter notes, upcoming appointments, etc.  Non-urgent messages can be sent to your provider as well.   To learn more about what you can do with MyChart, go to [email or web address].    Your next appointment:   3-4 month(s)  The format for your next appointment:   In Person  Provider:   You may see Sinclair Grooms, MD or one of the following Advanced Practice Providers on your designated Care Team:   Cecilie Kicks, NP   Other Instructions

## 2021-03-25 ENCOUNTER — Telehealth (HOSPITAL_COMMUNITY): Payer: Self-pay

## 2021-03-25 NOTE — Telephone Encounter (Signed)
Called patient to see if he was interested in participating in the Cardiac Rehab Program. Patient stated yes. Patient will come in for orientation on 04/13/21 @ 1:15PM and will attend the 1:45PM exercise class.   Tourist information centre manager.

## 2021-03-25 NOTE — Telephone Encounter (Signed)
Called patient to see if he was interested in participating in the Cardiac Rehab Program. Patient stated yes. Patient will come in for orientation on 04/19/21 @ 1:15PM and will attend the 1:45PM exercise class.   Tourist information centre manager.

## 2021-04-05 DIAGNOSIS — Z23 Encounter for immunization: Secondary | ICD-10-CM | POA: Diagnosis not present

## 2021-04-08 ENCOUNTER — Telehealth (HOSPITAL_COMMUNITY): Payer: Self-pay | Admitting: *Deleted

## 2021-04-08 NOTE — Telephone Encounter (Signed)
Called pt to remind him of his PR orientation appointment and to complete his health history. I spoke with pt's wife as that was the instructions left to speak with her.She confirms that they will be coming. Pt has some memory issues. We discussed Covid precautions, wearing mask proper shoes,directions to the department and our contact number.She voices understanding.

## 2021-04-13 ENCOUNTER — Other Ambulatory Visit: Payer: Self-pay

## 2021-04-13 ENCOUNTER — Telehealth: Payer: Self-pay | Admitting: Student

## 2021-04-13 ENCOUNTER — Encounter (HOSPITAL_COMMUNITY): Payer: Self-pay

## 2021-04-13 ENCOUNTER — Encounter (HOSPITAL_COMMUNITY)
Admission: RE | Admit: 2021-04-13 | Discharge: 2021-04-13 | Disposition: A | Discharge status: Home / Self Care | Payer: Medicare HMO | Source: Ambulatory Visit | Attending: Interventional Cardiology | Admitting: Interventional Cardiology

## 2021-04-13 VITALS — BP 124/72 | HR 74 | Ht 73.5 in | Wt 187.6 lb

## 2021-04-13 DIAGNOSIS — I214 Non-ST elevation (NSTEMI) myocardial infarction: Secondary | ICD-10-CM | POA: Insufficient documentation

## 2021-04-13 DIAGNOSIS — Z951 Presence of aortocoronary bypass graft: Secondary | ICD-10-CM | POA: Insufficient documentation

## 2021-04-13 HISTORY — DX: Atherosclerotic heart disease of native coronary artery without angina pectoris: I25.10

## 2021-04-13 NOTE — Progress Notes (Signed)
Cardiac Individual Treatment Plan  Patient Details  Name: Shane Ford MRN: [id number] Date of Birth: September 21, 1945 Referring Provider:   Flowsheet Row CARDIAC REHAB PHASE II ORIENTATION from 04/13/2021 in [facility]  Referring Provider Belva Crome, MD       Initial Encounter Date:  [place] PHASE II ORIENTATION from 04/13/2021 in [facility]  Date 04/13/21       Visit Diagnosis: 01/17/21 NSTEMI  7/26 CABG x 4  Patient's Home Medications on Admission:  Current Outpatient Medications:    aspirin EC 81 MG tablet, Take 81 mg by mouth daily. Swallow whole., Disp: , Rfl:    Cholecalciferol 50 MCG (2000 UT) CAPS, Take 2,000 Units by mouth daily., Disp: , Rfl:    docusate sodium (COLACE) 100 MG capsule, Take 200 mg by mouth 2 (two) times daily., Disp: , Rfl:    donepezil (ARICEPT) 5 MG tablet, Take 5 mg by mouth at bedtime., Disp: , Rfl:    empagliflozin (JARDIANCE) 10 MG TABS tablet, Take 1 tablet (10 mg total) by mouth daily before breakfast., Disp: 30 tablet, Rfl: 3   fenofibrate 160 MG tablet, Take 160 mg by mouth daily., Disp: , Rfl:    folic acid (FOLVITE) 361 MCG tablet, Take 800 mcg by mouth daily., Disp: , Rfl:    gabapentin (NEURONTIN) 300 MG capsule, Take 300 mg by mouth 2 (two) times daily., Disp: , Rfl:    glimepiride (AMARYL) 1 MG tablet, Take 2 mg by mouth 2 (two) times daily with a meal., Disp: , Rfl:    hydrochlorothiazide (MICROZIDE) 12.5 MG capsule, Take 1 capsule (12.5 mg total) by mouth daily., Disp: 90 capsule, Rfl: 3   metFORMIN (GLUCOPHAGE) 500 MG tablet, Take 500 mg by mouth 2 (two) times daily with a meal., Disp: , Rfl:    methotrexate (RHEUMATREX) 2.5 MG tablet, Take 12.5 mg by mouth once a week. Take 5 tablets (12.5mg ) once a week on Wednesdays. Caution:Chemotherapy. Protect from light., Disp: , Rfl:    polyvinyl alcohol (LIQUIFILM TEARS) 1.4 % ophthalmic solution, Place 1  drop into both eyes as needed for dry eyes., Disp: , Rfl:    simvastatin (ZOCOR) 40 MG tablet, Take 40 mg by mouth daily., Disp: , Rfl:    venlafaxine XR (EFFEXOR-XR) 37.5 MG 24 hr capsule, Take 37.5 mg by mouth daily with breakfast., Disp: , Rfl:    vitamin B-12 (CYANOCOBALAMIN) 1000 MCG tablet, Take 1,000 mcg by mouth daily., Disp: , Rfl:    lisinopril (ZESTRIL) 5 MG tablet, Take 1 tablet (5 mg total) by mouth daily. (Patient not taking: No sig reported), Disp: 90 tablet, Rfl: 3  Past Medical History: Past Medical History:  Diagnosis Date   Coronary artery disease    Diabetes mellitus ([place])    Hyperlipidemia    Hypertension     Tobacco Use: Social History   Tobacco Use  Smoking Status Never  Smokeless Tobacco Never    Labs: Recent Review Flowsheet Data     Labs for ITP Cardiac and Pulmonary Rehab Latest Ref Rng & Units 01/19/2021 01/19/2021 01/19/2021 01/19/2021 01/19/2021   Cholestrol 0 - 200 mg/dL - - - - -   LDLCALC 0 - 99 mg/dL - - - - -   HDL >40 mg/dL - - - - -   Trlycerides <150 mg/dL - - - - -   Hemoglobin A1c 4.8 - 5.6 % - - - - -  PHART 7.350 - 7.450 - - 7.370 7.335(L) 7.369   PCO2ART 32.0 - 48.0 mmHg - - 38.1 42.6 38.2   HCO3 20.0 - 28.0 mmol/L - - 22.0 22.7 21.9   TCO2 22 - 32 mmol/L 27 21(L) 23 24 23    ACIDBASEDEF 0.0 - 2.0 mmol/L - - 3.0(H) 3.0(H) 3.0(H)   O2SAT % - - 100.0 99.0 98.0       Capillary Blood Glucose: Lab Results  Component Value Date   GLUCAP 178 (H) 01/23/2021   GLUCAP 250 (H) 01/22/2021   GLUCAP 312 (H) 01/22/2021   GLUCAP 226 (H) 01/22/2021   GLUCAP 230 (H) 01/22/2021     Exercise Target Goals: Exercise Program Goal: Individual exercise prescription set using results from initial 6 min walk test and THRR while considering  patient's activity barriers and safety.   Exercise Prescription Goal: Starting with aerobic activity 30 plus minutes a day, 3 days per week for initial exercise prescription. Provide home exercise prescription  and guidelines that participant acknowledges understanding prior to discharge.  Activity Barriers & Risk Stratification:  Activity Barriers & Cardiac Risk Stratification - 04/13/21 1335       Activity Barriers & Cardiac Risk Stratification   Activity Barriers Arthritis;Joint Problems;Neck/Spine Problems;Assistive Device;Balance Concerns;History of Falls;Other (comment)    Comments Upcoming surgery to fuse vertebrae for pinched nerve, cervical spine. Numbmess, tingling, some pain left arm/hand from pinched nerve. Knees get sore with a lot of exercise.    Cardiac Risk Stratification High             6 Minute Walk:  6 Minute Walk     Row Name 04/13/21 1358         6 Minute Walk   Phase Initial     Distance 1190 feet     Walk Time 6 minutes     # of Rest Breaks 0     MPH 2.25     METS 2.57     RPE 9     Perceived Dyspnea  0     VO2 Peak 9.01     Symptoms No     Resting HR 74 bpm     Resting BP 124/72     Resting Oxygen Saturation  98 %     Exercise Oxygen Saturation  during 6 min walk 99 %     Max Ex. HR 88 bpm     Max Ex. BP 122/82     2 Minute Post BP 118/72              Oxygen Initial Assessment:   Oxygen Re-Evaluation:   Oxygen Discharge (Final Oxygen Re-Evaluation):   Initial Exercise Prescription:  Initial Exercise Prescription - 04/13/21 1500       Date of Initial Exercise RX and Referring Provider   Date 04/13/21    Referring Provider Belva Crome, MD    Expected Discharge Date 06/11/21      T5 Nustep   Level 1    SPM 85    Minutes 25    METs 2      Prescription Details   Frequency (times per week) 3    Duration Progress to 30 minutes of continuous aerobic without signs/symptoms of physical distress      Intensity   THRR 40-80% of Max Heartrate 58-116    Ratings of Perceived Exertion 11-13    Perceived Dyspnea 0-4      Progression   Progression Continue to progress workloads to maintain intensity  without signs/symptoms of  physical distress.      Resistance Training   Training Prescription Yes    Weight 2 lbs    Reps 10-15             Perform Capillary Blood Glucose checks as needed.  Exercise Prescription Changes:   Exercise Comments:   Exercise Goals and Review:   Exercise Goals     Row Name 04/13/21 1318             Exercise Goals   Increase Physical Activity Yes       Intervention Provide advice, education, support and counseling about physical activity/exercise needs.;Develop an individualized exercise prescription for aerobic and resistive training based on initial evaluation findings, risk stratification, comorbidities and participant's personal goals.       Expected Outcomes Short Term: Attend rehab on a regular basis to increase amount of physical activity.;Long Term: Exercising regularly at least 3-5 days a week.;Long Term: Add in home exercise to make exercise part of routine and to increase amount of physical activity.       Increase Strength and Stamina Yes       Intervention Provide advice, education, support and counseling about physical activity/exercise needs.;Develop an individualized exercise prescription for aerobic and resistive training based on initial evaluation findings, risk stratification, comorbidities and participant's personal goals.       Expected Outcomes Short Term: Increase workloads from initial exercise prescription for resistance, speed, and METs.;Short Term: Perform resistance training exercises routinely during rehab and add in resistance training at home;Long Term: Improve cardiorespiratory fitness, muscular endurance and strength as measured by increased METs and functional capacity (6MWT)       Able to understand and use rate of perceived exertion (RPE) scale Yes       Intervention Provide education and explanation on how to use RPE scale       Expected Outcomes Short Term: Able to use RPE daily in rehab to express subjective intensity level;Long Term:   Able to use RPE to guide intensity level when exercising independently       Knowledge and understanding of Target Heart Rate Range (THRR) Yes       Intervention Provide education and explanation of THRR including how the numbers were predicted and where they are located for reference       Expected Outcomes Short Term: Able to state/look up THRR;Long Term: Able to use THRR to govern intensity when exercising independently;Short Term: Able to use daily as guideline for intensity in rehab       Able to check pulse independently Yes       Intervention Provide education and demonstration on how to check pulse in carotid and radial arteries.;Review the importance of being able to check your own pulse for safety during independent exercise       Expected Outcomes Short Term: Able to explain why pulse checking is important during independent exercise;Long Term: Able to check pulse independently and accurately       Understanding of Exercise Prescription Yes       Intervention Provide education, explanation, and written materials on patient's individual exercise prescription       Expected Outcomes Short Term: Able to explain program exercise prescription;Long Term: Able to explain home exercise prescription to exercise independently                Exercise Goals Re-Evaluation :    Discharge Exercise Prescription (Final Exercise Prescription Changes):   Nutrition:  Target Goals:  Understanding of nutrition guidelines, daily intake of sodium 1500mg , cholesterol 200mg , calories 30% from fat and 7% or less from saturated fats, daily to have 5 or more servings of fruits and vegetables.  Biometrics:  Pre Biometrics - 04/13/21 1315       Pre Biometrics   Waist Circumference 38.5 inches    Hip Circumference 39 inches    Waist to Hip Ratio 0.99 %    Triceps Skinfold 15 mm    % Body Fat 25.4 %    Grip Strength 34 kg    Flexibility --   Not performed due to pinched nerve.   Single Leg Stand  5.81 seconds              Nutrition Therapy Plan and Nutrition Goals:   Nutrition Assessments:  MEDIFICTS Score Key: ?70 Need to make dietary changes  40-70 Heart Healthy Diet ? 40 Therapeutic Level Cholesterol Diet   Picture Your Plate Scores: <05 Unhealthy dietary pattern with much room for improvement. 41-50 Dietary pattern unlikely to meet recommendations for good health and room for improvement. 51-60 More healthful dietary pattern, with some room for improvement.  >60 Healthy dietary pattern, although there may be some specific behaviors that could be improved.    Nutrition Goals Re-Evaluation:   Nutrition Goals Discharge (Final Nutrition Goals Re-Evaluation):   Psychosocial: Target Goals: Acknowledge presence or absence of significant depression and/or stress, maximize coping skills, provide positive support system. Participant is able to verbalize types and ability to use techniques and skills needed for reducing stress and depression.  Initial Review & Psychosocial Screening:  Initial Psych Review & Screening - 04/13/21 1609       Initial Review   Current issues with History of Depression   has early stage dementia. On Aricept. On an antidepressant for depression. Denies being currently depressed     Family Dynamics   Good Support System? Yes   Amar has his wife and son who is a professor at [employer]   Psychosocial barriers to participate in program The patient should benefit from training in stress management and relaxation.      Screening Interventions   Interventions Encouraged to exercise;To provide support and resources with identified psychosocial needs    Expected Outcomes Long Term Goal: Stressors or current issues are controlled or eliminated.;Short Term goal: Identification and review with participant of any Quality of Life or Depression concerns found by scoring the questionnaire.             Quality of Life Scores:  Quality  of Life - 04/13/21 1455       Quality of Life   Select Quality of Life      Quality of Life Scores   Health/Function Pre 22.43 %    Socioeconomic Pre 26.5 %    Psych/Spiritual Pre 25.36 %    Family Pre 28.8 %    GLOBAL Pre 24.81 %            Scores of 19 and below usually indicate a poorer quality of life in these areas.  A difference of  2-3 points is a clinically meaningful difference.  A difference of 2-3 points in the total score of the Quality of Life Index has been associated with significant improvement in overall quality of life, self-image, physical symptoms, and general health in studies assessing change in quality of life.  PHQ-9: Recent Review Flowsheet Data     Depression screen [facility] 2/9 04/13/2021  Decreased Interest 0   Down, Depressed, Hopeless 0   PHQ - 2 Score 0      Interpretation of Total Score  Total Score Depression Severity:  1-4 = Minimal depression, 5-9 = Mild depression, 10-14 = Moderate depression, 15-19 = Moderately severe depression, 20-27 = Severe depression   Psychosocial Evaluation and Intervention:   Psychosocial Re-Evaluation:   Psychosocial Discharge (Final Psychosocial Re-Evaluation):   Vocational Rehabilitation: Provide vocational rehab assistance to qualifying candidates.   Vocational Rehab Evaluation & Intervention:  Vocational Rehab - 04/13/21 1614       Initial Vocational Rehab Evaluation & Intervention   Assessment shows need for Vocational Rehabilitation No   Baldemar is retired and does not need vocational rehab at this time            Education: Education Goals: Education classes will be provided on a weekly basis, covering required topics. Participant will state understanding/return demonstration of topics presented.  Learning Barriers/Preferences:  Learning Barriers/Preferences - 04/13/21 1612       Learning Barriers/Preferences   Learning Barriers Inability to learn new things;Exercise Concerns;Sight    Wears glasses has ealy onset dementia. has cervical disk disease needs surgery will have soon.   Learning Preferences Skilled Demonstration;Verbal Instruction;Video;Individual Instruction;Pictoral             Education Topics: Hypertension, Hypertension Reduction -Define heart disease and high blood pressure. Discus how high blood pressure affects the body and ways to reduce high blood pressure.   Exercise and Your Heart -Discuss why it is important to exercise, the FITT principles of exercise, normal and abnormal responses to exercise, and how to exercise safely.   Angina -Discuss definition of angina, causes of angina, treatment of angina, and how to decrease risk of having angina.   Cardiac Medications -Review what the following cardiac medications are used for, how they affect the body, and side effects that may occur when taking the medications.  Medications include Aspirin, Beta blockers, calcium channel blockers, ACE Inhibitors, angiotensin receptor blockers, diuretics, digoxin, and antihyperlipidemics.   Congestive Heart Failure -Discuss the definition of CHF, how to live with CHF, the signs and symptoms of CHF, and how keep track of weight and sodium intake.   Heart Disease and Intimacy -Discus the effect sexual activity has on the heart, how changes occur during intimacy as we age, and safety during sexual activity.   Smoking Cessation / COPD -Discuss different methods to quit smoking, the health benefits of quitting smoking, and the definition of COPD.   Nutrition I: Fats -Discuss the types of cholesterol, what cholesterol does to the heart, and how cholesterol levels can be controlled.   Nutrition II: Labels -Discuss the different components of food labels and how to read food label   Heart Parts/Heart Disease and PAD -Discuss the anatomy of the heart, the pathway of blood circulation through the heart, and these are affected by heart disease.   Stress I:  Signs and Symptoms -Discuss the causes of stress, how stress may lead to anxiety and depression, and ways to limit stress.   Stress II: Relaxation -Discuss different types of relaxation techniques to limit stress.   Warning Signs of Stroke / TIA -Discuss definition of a stroke, what the signs and symptoms are of a stroke, and how to identify when someone is having stroke.   Knowledge Questionnaire Score:  Knowledge Questionnaire Score - 04/13/21 1452       Knowledge Questionnaire Score   Pre Score 19/24  Core Components/Risk Factors/Patient Goals at Admission:  Personal Goals and Risk Factors at Admission - 04/13/21 1319       Core Components/Risk Factors/Patient Goals on Admission    Weight Management Yes;Weight Maintenance    Intervention Weight Management: Develop a combined nutrition and exercise program designed to reach desired caloric intake, while maintaining appropriate intake of nutrient and fiber, sodium and fats, and appropriate energy expenditure required for the weight goal.;Weight Management: Provide education and appropriate resources to help participant work on and attain dietary goals.    Expected Outcomes Weight Maintenance: Understanding of the daily nutrition guidelines, which includes 25-35% calories from fat, 7% or less cal from saturated fats, less than 200mg  cholesterol, less than 1.5gm of sodium, & 5 or more servings of fruits and vegetables daily;Weight Loss: Understanding of general recommendations for a balanced deficit meal plan, which promotes 1-2 lb weight loss per week and includes a negative energy balance of [phone] kcal/d;Understanding recommendations for meals to include 15-35% energy as protein, 25-35% energy from fat, 35-60% energy from carbohydrates, less than 200mg  of dietary cholesterol, 20-35 gm of total fiber daily    Diabetes Yes    Intervention Provide education about signs/symptoms and action to take for  hypo/hyperglycemia.;Provide education about proper nutrition, including hydration, and aerobic/resistive exercise prescription along with prescribed medications to achieve blood glucose in normal ranges: Fasting glucose 65-99 mg/dL    Expected Outcomes Short Term: Participant verbalizes understanding of the signs/symptoms and immediate care of hyper/hypoglycemia, proper foot care and importance of medication, aerobic/resistive exercise and nutrition plan for blood glucose control.;Long Term: Attainment of HbA1C < 7%.    Hypertension Yes    Intervention Provide education on lifestyle modifcations including regular physical activity/exercise, weight management, moderate sodium restriction and increased consumption of fresh fruit, vegetables, and low fat dairy, alcohol moderation, and smoking cessation.;Monitor prescription use compliance.    Expected Outcomes Short Term: Continued assessment and intervention until BP is < 140/78mm HG in hypertensive participants. < 130/102mm HG in hypertensive participants with diabetes, heart failure or chronic kidney disease.;Long Term: Maintenance of blood pressure at goal levels.    Lipids Yes    Intervention Provide education and support for participant on nutrition & aerobic/resistive exercise along with prescribed medications to achieve LDL 70mg , HDL >40mg .    Expected Outcomes Short Term: Participant states understanding of desired cholesterol values and is compliant with medications prescribed. Participant is following exercise prescription and nutrition guidelines.;Long Term: Cholesterol controlled with medications as prescribed, with individualized exercise RX and with personalized nutrition plan. Value goals: LDL < 70mg , HDL > 40 mg.    Stress Yes    Intervention Offer individual and/or small group education and counseling on adjustment to heart disease, stress management and health-related lifestyle change. Teach and support self-help strategies.;Refer  participants experiencing significant psychosocial distress to appropriate mental health specialists for further evaluation and treatment. When possible, include family members and significant others in education/counseling sessions.    Expected Outcomes Short Term: Participant demonstrates changes in health-related behavior, relaxation and other stress management skills, ability to obtain effective social support, and compliance with psychotropic medications if prescribed.;Long Term: Emotional wellbeing is indicated by absence of clinically significant psychosocial distress or social isolation.             Core Components/Risk Factors/Patient Goals Review:    Core Components/Risk Factors/Patient Goals at Discharge (Final Review):    ITP Comments:  ITP Comments     Row Name 04/13/21 1315  ITP Comments Medical Director- Dr. Fransico Him, MD                Comments: Jhordan  attended orientation on 04/13/2021 to review rules and guidelines for program.  Completed 6 minute walk test, Intitial ITP, and exercise prescription.  VSS. Telemetry-Second degree AV block type 1.  Asymptomatic. See previous documentation in epic. Safety measures and social distancing in place per CDC guidelines. Barnet Pall, RN,BSN 04/13/2021 4:20 PM

## 2021-04-13 NOTE — Progress Notes (Signed)
Cardiac Rehab Medication Review by a Nurse  Does the patient  feel that his/her medications are working for him/her?  yes  Has the patient been experiencing any side effects to the medications prescribed?  no  Does the patient measure his/her own blood pressure or blood glucose at home?  yes   Does the patient have any problems obtaining medications due to transportation or finances?   no  Understanding of regimen: good Understanding of indications: good Potential of compliance: good    Nurse  comments: Giuseppe is taking his medications as prescribed and has a good understanding of what his medications are for. Tirth says he is taking Aricept for dementia he is not driving currently but hopes to drive in the near future is he is able. Tovia checks his CBG's at home. Kristofer has a BP cuff/ monitor he does not check his BP on a regular basis as he has been feeling good.    Harrell Gave RN BSN 04/13/2021 1:38 PM

## 2021-04-13 NOTE — Progress Notes (Signed)
Shane Ford was noted to have a variable PR interval consistent with probable second degree AV block type 1. Patient asymptomatic. Blood pressure 124/72. Oxygen saturation 98% on room air. Sande Rives [facility] paged and notified about today's ECG rhtyhm. Faxed today's ECG rhythm to [facility] for review. Callie reviewed the ECG tracings from cardiac rehab orientation and said that it looks like Shane Ford is having Wenckebach. As long long as Shane Ford remains asymptomatic he may proceed with orientation today and exercise next week. Will fax today's ECG tracings  to Dr. Thompson Caul  office for review.Barnet Pall, RN,BSN 04/13/2021 2:34 PM

## 2021-04-13 NOTE — Telephone Encounter (Signed)
   Received a call from [facility] earlier today. Patient arrive for orientation session and was noted to have what looked like Wenckebach on telemetry. Strips were faxed to be and it does look like patient has intermittent Wenckebach (prolonging PR interval and then drop beat). Underlying rhythm is sinus rhythm with rates in the 70s. BP well controlled in the 120s/70s. Patient asymptomatic. Not on any AV nodal agents at home. OK to proceed with Cardiac Rehab session today. No changes in medications necessary. Recommended patient let us know if he has any symptoms of bradycardia.  Darreld Mclean, PA-C 04/13/2021 5:20 PM

## 2021-04-15 ENCOUNTER — Telehealth (HOSPITAL_COMMUNITY): Payer: Self-pay | Admitting: *Deleted

## 2021-04-15 NOTE — Telephone Encounter (Signed)
-----   Message from Belva Crome, MD sent at 04/15/2021  3:29 PM EDT ----- Regarding: RE: Second degree AV block type 1 Reviewed. Agree with recommendation by Callie. Mobitz I is likely to go away with exercise. If he has symptoms, sudden bradycardia, or higher grade AV block with exercise, he should be taken out of exercise. ----- Message ----- From: Magda Kiel, RN Sent: 04/14/2021   8:47 AM EDT To: Belva Crome, MD, Loren Racer, RN Subject: Second degree AV block type 1                  Good morning Dr Tamala Julian,  Mr [facility] attended cardiac rehab orientation yesterday. Mr Dietze was noted to have a variable PR interval consistent with probable second degree AV block type 1. Patient asymptomatic. Blood pressure 124/72. Oxygen saturation 98% on room air. Sande Rives [facility] paged and notified about today's ECG rhtyhm. Faxed today's ECG rhythm to [facility] for review. Callie reviewed the ECG tracings from cardiac rehab orientation and said that it looks like Mr Voris is having Wenckebach and said that As long long as Mr Cada remains asymptomatic he may proceed with orientation today and exercise next week.  There is a 12 lead ECG that shows second degree heart block type 1 on 01/20/21. I faxed the ECG tracings form cardiac rehab for your review to the office this morning. Mr Mcveigh's heart rate ranged from 53-88. Blood pressure 825- 053 systolic.Diastolic BP  97-67. Mr Bucklew is not on beta blocker.  Please advise if you have any further recommendations or concerns.  Thank you! Sincerely, [facility]  Cardiac Rehab

## 2021-04-19 ENCOUNTER — Other Ambulatory Visit: Payer: Self-pay

## 2021-04-19 ENCOUNTER — Encounter (HOSPITAL_COMMUNITY)
Admission: RE | Admit: 2021-04-19 | Discharge: 2021-04-19 | Disposition: A | Discharge status: Home / Self Care | Payer: Medicare HMO | Source: Ambulatory Visit | Attending: Interventional Cardiology | Admitting: Interventional Cardiology

## 2021-04-19 DIAGNOSIS — Z951 Presence of aortocoronary bypass graft: Secondary | ICD-10-CM | POA: Diagnosis not present

## 2021-04-19 DIAGNOSIS — I214 Non-ST elevation (NSTEMI) myocardial infarction: Secondary | ICD-10-CM | POA: Diagnosis not present

## 2021-04-19 LAB — GLUCOSE, CAPILLARY
Glucose-Capillary: 106 mg/dL — ABNORMAL HIGH (ref 70–99)
Glucose-Capillary: 117 mg/dL — ABNORMAL HIGH (ref 70–99)

## 2021-04-19 NOTE — Progress Notes (Signed)
Daily Session Note  Patient Details  Name: Shane Ford MRN: [id number] Date of Birth: 1946/05/29 Referring Provider:   Flowsheet Row CARDIAC REHAB PHASE II ORIENTATION from 04/13/2021 in [place]  Referring Provider Belva Crome, MD       Encounter Date: 04/19/2021  Check In:  Session Check In - 04/19/21 1407       Check-In   Supervising physician immediately available to respond to emergencies [facility] immediately available    Physician(s) Dr. Nevada Crane    Location [facility]    Staff Present Seward Carol, MS, ACSM CEP, Exercise Physiologist;Santiago Stenzel, RN, BSN;Jetta Walker BS, ACSM EP-C, Exercise Physiologist;Carlette Wilber Oliphant, RN, Maxcine Ham, RN, BSN    Virtual Visit No    Medication changes reported     No    Fall or balance concerns reported    No    Tobacco Cessation No Change    Warm-up and Cool-down Performed on first and last piece of equipment    Resistance Training Performed Yes    VAD Patient? No    PAD/SET Patient? No      Pain Assessment   Currently in Pain? No/denies    Pain Score 0-No pain    Multiple Pain Sites No             Capillary Blood Glucose: Results for orders placed or performed during the hospital encounter of 04/19/21 (from the past 24 hour(s))  Glucose, capillary     Status: Abnormal   Collection Time: 04/19/21  1:34 PM  Result Value Ref Range   Glucose-Capillary 106 (H) 70 - 99 mg/dL  Glucose, capillary     Status: Abnormal   Collection Time: 04/19/21  2:32 PM  Result Value Ref Range   Glucose-Capillary 117 (H) 70 - 99 mg/dL     Exercise Prescription Changes - 04/19/21 1351       Response to Exercise   Blood Pressure (Admit) 114/70    Blood Pressure (Exercise) 138/68    Blood Pressure (Exit) 114/60    Heart Rate (Admit) 84 bpm    Heart Rate (Exercise) 80 bpm    Heart Rate (Exit) 55 bpm    Rating of Perceived Exertion (Exercise) 11    Symptoms  None    Comments Off to a good start with exercise.    Duration Progress to 30 minutes of  aerobic without signs/symptoms of physical distress    Intensity THRR unchanged      Progression   Progression Continue to progress workloads to maintain intensity without signs/symptoms of physical distress.    Average METs 2      Resistance Training   Training Prescription Yes    Weight 2 lbs    Reps 10-15    Time 10 Minutes      Interval Training   Interval Training No      T5 Nustep   Level 1    SPM 85    Minutes 25    METs 2             Social History   Tobacco Use  Smoking Status Never  Smokeless Tobacco Never    Goals Met:  Exercise tolerated well No report of concerns or symptoms today Strength training completed today  Goals Unmet:  Not Applicable  Comments: [name] started cardiac rehab today.  Pt tolerated light exercise without difficulty. VSS, telemetry-Sinus Rhythm, Bundle  branch block occasional PVC, intermittent Wenkeback. asymptomatic.  Medication  list reconciled. Pt denies barriers to medicaiton compliance.  PSYCHOSOCIAL ASSESSMENT:  PHQ-0. Pt exhibits positive coping skills, hopeful outlook with supportive family. No psychosocial needs identified at this time, no psychosocial interventions necessary..   Pt oriented to exercise equipment and routine.    Understanding verbalized. Barnet Pall, RN,BSN 04/19/2021 3:52 PM    Dr. Fransico Him is Medical Director for Cardiac Rehab at [facility].

## 2021-04-20 ENCOUNTER — Other Ambulatory Visit: Payer: Self-pay | Admitting: Orthopedic Surgery

## 2021-04-21 ENCOUNTER — Other Ambulatory Visit: Payer: Self-pay

## 2021-04-21 ENCOUNTER — Encounter (HOSPITAL_COMMUNITY)
Admission: RE | Admit: 2021-04-21 | Discharge: 2021-04-21 | Disposition: A | Discharge status: Home / Self Care | Payer: Medicare HMO | Source: Ambulatory Visit | Attending: Interventional Cardiology | Admitting: Interventional Cardiology

## 2021-04-21 ENCOUNTER — Telehealth: Payer: Self-pay | Admitting: *Deleted

## 2021-04-21 DIAGNOSIS — I214 Non-ST elevation (NSTEMI) myocardial infarction: Secondary | ICD-10-CM | POA: Diagnosis not present

## 2021-04-21 DIAGNOSIS — Z951 Presence of aortocoronary bypass graft: Secondary | ICD-10-CM

## 2021-04-21 NOTE — Telephone Encounter (Signed)
   Pre-operative Risk Assessment    Patient Name: Shane Ford [facility]  DOB: [birth year] MRN: [id number]      Request for Surgical Clearance   Procedure:   ANTERIOR CERVICAL DECOMPRESSION FUSION C5-6 WITH INSTRUMENTATION  Date of Surgery: Clearance 05/12/21                                 Surgeon:  DR. MARK DUMONSKI Surgeon's Group or Practice Name:  [facility] Phone number:  [phone] Fax number:  [phone] ATTN: Neita Garnet   Type of Clearance Requested: - Medical  - Pharmacy:  Hold Aspirin     Type of Anesthesia:   General    Additional requests/questions:   Jiles Prows   04/21/2021, 2:17 PM

## 2021-04-21 NOTE — Telephone Encounter (Signed)
   Patient Name: Shane Ford [facility]  DOB: 1945/10/15 MRN: [id number]  Primary Cardiologist: Sinclair Grooms, MD  Chart reviewed as part of pre-operative protocol coverage. Patient had fairly recent admission 12/2020 for inferior STEMI related to complete occlusion of the first left ventricular branch of the right coronary. This was too distal and too small to intervene upon. He otherwise had significant 3v disease and underwent CABG 01/19/21. Will route to Dr. Tamala Julian for input on proceeding with requested surgery (anterior cervical decompression fusion C5-C6 under general anesthesia with request to hold ASA) since CABG was fairly recent. Dr. Tamala Julian - Please route response to P CV DIV PREOP (the pre-op pool). Thank you.  Once reply known, patient will need call.  Charlie Pitter, PA-C 04/21/2021, 2:59 PM

## 2021-04-22 LAB — GLUCOSE, CAPILLARY
Glucose-Capillary: 115 mg/dL — ABNORMAL HIGH (ref 70–99)
Glucose-Capillary: 80 mg/dL (ref 70–99)
Glucose-Capillary: 95 mg/dL (ref 70–99)

## 2021-04-23 ENCOUNTER — Encounter (HOSPITAL_COMMUNITY)
Admission: RE | Admit: 2021-04-23 | Discharge: 2021-04-23 | Disposition: A | Discharge status: Home / Self Care | Payer: Medicare HMO | Source: Ambulatory Visit | Attending: Interventional Cardiology | Admitting: Interventional Cardiology

## 2021-04-23 ENCOUNTER — Other Ambulatory Visit: Payer: Self-pay

## 2021-04-23 DIAGNOSIS — I214 Non-ST elevation (NSTEMI) myocardial infarction: Secondary | ICD-10-CM

## 2021-04-23 DIAGNOSIS — Z951 Presence of aortocoronary bypass graft: Secondary | ICD-10-CM | POA: Diagnosis not present

## 2021-04-23 LAB — GLUCOSE, CAPILLARY: Glucose-Capillary: 145 mg/dL — ABNORMAL HIGH (ref 70–99)

## 2021-04-23 NOTE — Telephone Encounter (Signed)
   Name: Shane Ford [facility]  DOB: [birth year]  MRN: [id number]   Primary Cardiologist: Sinclair Grooms, MD  Chart reviewed as part of pre-operative protocol coverage. Patient was contacted 04/23/2021 in reference to pre-operative risk assessment for pending surgery as outlined below.  Shane Ford was last seen on 03/09/21 by Dr. Tamala Julian.  Since that day, Shane Ford has done well from a cardiac standpoint. He has been working with cardiac rehab. He can complete 4 METS without anginal complaints  Therefore, based on ACC/AHA guidelines, the patient would be at acceptable risk for the planned procedure without further cardiovascular testing.   The patient was advised that if he develops new symptoms prior to surgery to contact our office to arrange for a follow-up visit, and he verbalized understanding.  Per Dr. Tamala Julian, patient can hold aspirin 5-7 days prior to his upcoming procedure with plans to restart as soon as he is cleared to do so by his surgeon.  I will route this recommendation to the requesting party via Epic fax function and remove from pre-op pool. Please call with questions.  Abigail Butts, PA-C 04/23/2021, 3:39 PM

## 2021-04-26 ENCOUNTER — Encounter (HOSPITAL_COMMUNITY)
Admission: RE | Admit: 2021-04-26 | Discharge: 2021-04-26 | Disposition: A | Discharge status: Home / Self Care | Payer: Medicare HMO | Source: Ambulatory Visit | Attending: Interventional Cardiology | Admitting: Interventional Cardiology

## 2021-04-26 ENCOUNTER — Other Ambulatory Visit: Payer: Self-pay

## 2021-04-26 DIAGNOSIS — I214 Non-ST elevation (NSTEMI) myocardial infarction: Secondary | ICD-10-CM

## 2021-04-26 DIAGNOSIS — Z951 Presence of aortocoronary bypass graft: Secondary | ICD-10-CM

## 2021-04-27 NOTE — Progress Notes (Signed)
Cardiac Individual Treatment Plan  Patient Details  Name: Hari Casaus MRN: [id number] Date of Birth: [birth year] Referring Provider:   Flowsheet Row CARDIAC REHAB PHASE II ORIENTATION from 04/13/2021 in [place]  Referring Provider Belva Crome, MD       Initial Encounter Date:  [place] PHASE II ORIENTATION from 04/13/2021 in [place]  Date 04/13/21       Visit Diagnosis: 01/17/21 NSTEMI  7/26 CABG x 4  Patient's Home Medications on Admission:  Current Outpatient Medications:    aspirin EC 81 MG tablet, Take 81 mg by mouth daily. Swallow whole., Disp: , Rfl:    Cholecalciferol 50 MCG (2000 UT) CAPS, Take 2,000 Units by mouth daily., Disp: , Rfl:    docusate sodium (COLACE) 100 MG capsule, Take 200 mg by mouth 2 (two) times daily., Disp: , Rfl:    donepezil (ARICEPT) 5 MG tablet, Take 5 mg by mouth at bedtime., Disp: , Rfl:    empagliflozin (JARDIANCE) 10 MG TABS tablet, Take 1 tablet (10 mg total) by mouth daily before breakfast., Disp: 30 tablet, Rfl: 3   fenofibrate 160 MG tablet, Take 160 mg by mouth daily., Disp: , Rfl:    folic acid (FOLVITE) 202 MCG tablet, Take 800 mcg by mouth daily., Disp: , Rfl:    gabapentin (NEURONTIN) 300 MG capsule, Take 300 mg by mouth 2 (two) times daily., Disp: , Rfl:    glimepiride (AMARYL) 1 MG tablet, Take 2 mg by mouth 2 (two) times daily with a meal., Disp: , Rfl:    hydrochlorothiazide (MICROZIDE) 12.5 MG capsule, Take 1 capsule (12.5 mg total) by mouth daily., Disp: 90 capsule, Rfl: 3   lisinopril (ZESTRIL) 5 MG tablet, Take 1 tablet (5 mg total) by mouth daily. (Patient not taking: No sig reported), Disp: 90 tablet, Rfl: 3   metFORMIN (GLUCOPHAGE) 500 MG tablet, Take 500 mg by mouth 2 (two) times daily with a meal., Disp: , Rfl:    methotrexate (RHEUMATREX) 2.5 MG tablet, Take 12.5 mg by mouth once a week. Take 5 tablets (12.5mg ) once a week on  Wednesdays. Caution:Chemotherapy. Protect from light., Disp: , Rfl:    polyvinyl alcohol (LIQUIFILM TEARS) 1.4 % ophthalmic solution, Place 1 drop into both eyes as needed for dry eyes., Disp: , Rfl:    simvastatin (ZOCOR) 40 MG tablet, Take 40 mg by mouth daily., Disp: , Rfl:    venlafaxine XR (EFFEXOR-XR) 37.5 MG 24 hr capsule, Take 37.5 mg by mouth daily with breakfast., Disp: , Rfl:    vitamin B-12 (CYANOCOBALAMIN) 1000 MCG tablet, Take 1,000 mcg by mouth daily., Disp: , Rfl:   Past Medical History: Past Medical History:  Diagnosis Date   Coronary artery disease    Diabetes mellitus ([place])    Hyperlipidemia    Hypertension     Tobacco Use: Social History   Tobacco Use  Smoking Status Never  Smokeless Tobacco Never    Labs: Recent Review Flowsheet Data     Labs for ITP Cardiac and Pulmonary Rehab Latest Ref Rng & Units 01/19/2021 01/19/2021 01/19/2021 01/19/2021 01/19/2021   Cholestrol 0 - 200 mg/dL - - - - -   LDLCALC 0 - 99 mg/dL - - - - -   HDL >40 mg/dL - - - - -   Trlycerides <150 mg/dL - - - - -   Hemoglobin A1c 4.8 - 5.6 % - - - - -  PHART 7.350 - 7.450 - - 7.370 7.335(L) 7.369   PCO2ART 32.0 - 48.0 mmHg - - 38.1 42.6 38.2   HCO3 20.0 - 28.0 mmol/L - - 22.0 22.7 21.9   TCO2 22 - 32 mmol/L 27 21(L) 23 24 23    ACIDBASEDEF 0.0 - 2.0 mmol/L - - 3.0(H) 3.0(H) 3.0(H)   O2SAT % - - 100.0 99.0 98.0       Capillary Blood Glucose: Lab Results  Component Value Date   GLUCAP 145 (H) 04/23/2021   GLUCAP 95 04/21/2021   GLUCAP 80 04/21/2021   GLUCAP 115 (H) 04/21/2021   GLUCAP 117 (H) 04/19/2021     Exercise Target Goals: Exercise Program Goal: Individual exercise prescription set using results from initial 6 min walk test and THRR while considering  patient's activity barriers and safety.   Exercise Prescription Goal: Starting with aerobic activity 30 plus minutes a day, 3 days per week for initial exercise prescription. Provide home exercise prescription and  guidelines that participant acknowledges understanding prior to discharge.  Activity Barriers & Risk Stratification:  Activity Barriers & Cardiac Risk Stratification - 04/13/21 1335       Activity Barriers & Cardiac Risk Stratification   Activity Barriers Arthritis;Joint Problems;Neck/Spine Problems;Assistive Device;Balance Concerns;History of Falls;Other (comment)    Comments Upcoming surgery to fuse vertebrae for pinched nerve, cervical spine. Numbmess, tingling, some pain left arm/hand from pinched nerve. Knees get sore with a lot of exercise.    Cardiac Risk Stratification High             6 Minute Walk:  6 Minute Walk     Row Name 04/13/21 1358         6 Minute Walk   Phase Initial     Distance 1190 feet     Walk Time 6 minutes     # of Rest Breaks 0     MPH 2.25     METS 2.57     RPE 9     Perceived Dyspnea  0     VO2 Peak 9.01     Symptoms No     Resting HR 74 bpm     Resting BP 124/72     Resting Oxygen Saturation  98 %     Exercise Oxygen Saturation  during 6 min walk 99 %     Max Ex. HR 88 bpm     Max Ex. BP 122/82     2 Minute Post BP 118/72              Oxygen Initial Assessment:   Oxygen Re-Evaluation:   Oxygen Discharge (Final Oxygen Re-Evaluation):   Initial Exercise Prescription:  Initial Exercise Prescription - 04/13/21 1500       Date of Initial Exercise RX and Referring Provider   Date 04/13/21    Referring Provider Belva Crome, MD    Expected Discharge Date 06/11/21      T5 Nustep   Level 1    SPM 85    Minutes 25    METs 2      Prescription Details   Frequency (times per week) 3    Duration Progress to 30 minutes of continuous aerobic without signs/symptoms of physical distress      Intensity   THRR 40-80% of Max Heartrate 58-116    Ratings of Perceived Exertion 11-13    Perceived Dyspnea 0-4      Progression   Progression Continue to progress workloads to maintain intensity without signs/symptoms  of physical  distress.      Resistance Training   Training Prescription Yes    Weight 2 lbs    Reps 10-15             Perform Capillary Blood Glucose checks as needed.  Exercise Prescription Changes:   Exercise Prescription Changes     Row Name 04/19/21 1351             Response to Exercise   Blood Pressure (Admit) 114/70       Blood Pressure (Exercise) 138/68       Blood Pressure (Exit) 114/60       Heart Rate (Admit) 84 bpm       Heart Rate (Exercise) 80 bpm       Heart Rate (Exit) 55 bpm       Rating of Perceived Exertion (Exercise) 11       Symptoms None       Comments Off to a good start with exercise.       Duration Progress to 30 minutes of  aerobic without signs/symptoms of physical distress       Intensity THRR unchanged         Progression   Progression Continue to progress workloads to maintain intensity without signs/symptoms of physical distress.       Average METs 2         Resistance Training   Training Prescription Yes       Weight 2 lbs       Reps 10-15       Time 10 Minutes         Interval Training   Interval Training No         T5 Nustep   Level 1       SPM 85       Minutes 25       METs 2                Exercise Comments:   Exercise Comments     Row Name 04/19/21 1440           Exercise Comments Patient tolerated low intensity exercise well without symptoms. Oriented patient to weights and stretches.                Exercise Goals and Review:   Exercise Goals     Row Name 04/13/21 1318             Exercise Goals   Increase Physical Activity Yes       Intervention Provide advice, education, support and counseling about physical activity/exercise needs.;Develop an individualized exercise prescription for aerobic and resistive training based on initial evaluation findings, risk stratification, comorbidities and participant's personal goals.       Expected Outcomes Short Term: Attend rehab on a regular basis to increase  amount of physical activity.;Long Term: Exercising regularly at least 3-5 days a week.;Long Term: Add in home exercise to make exercise part of routine and to increase amount of physical activity.       Increase Strength and Stamina Yes       Intervention Provide advice, education, support and counseling about physical activity/exercise needs.;Develop an individualized exercise prescription for aerobic and resistive training based on initial evaluation findings, risk stratification, comorbidities and participant's personal goals.       Expected Outcomes Short Term: Increase workloads from initial exercise prescription for resistance, speed, and METs.;Short Term: Perform resistance training exercises routinely during rehab and add  in resistance training at home;Long Term: Improve cardiorespiratory fitness, muscular endurance and strength as measured by increased METs and functional capacity (6MWT)       Able to understand and use rate of perceived exertion (RPE) scale Yes       Intervention Provide education and explanation on how to use RPE scale       Expected Outcomes Short Term: Able to use RPE daily in rehab to express subjective intensity level;Long Term:  Able to use RPE to guide intensity level when exercising independently       Knowledge and understanding of Target Heart Rate Range (THRR) Yes       Intervention Provide education and explanation of THRR including how the numbers were predicted and where they are located for reference       Expected Outcomes Short Term: Able to state/look up THRR;Long Term: Able to use THRR to govern intensity when exercising independently;Short Term: Able to use daily as guideline for intensity in rehab       Able to check pulse independently Yes       Intervention Provide education and demonstration on how to check pulse in carotid and radial arteries.;Review the importance of being able to check your own pulse for safety during independent exercise        Expected Outcomes Short Term: Able to explain why pulse checking is important during independent exercise;Long Term: Able to check pulse independently and accurately       Understanding of Exercise Prescription Yes       Intervention Provide education, explanation, and written materials on patient's individual exercise prescription       Expected Outcomes Short Term: Able to explain program exercise prescription;Long Term: Able to explain home exercise prescription to exercise independently                Exercise Goals Re-Evaluation :  Exercise Goals Re-Evaluation     Row Name 04/19/21 1440             Exercise Goal Re-Evaluation   Exercise Goals Review Increase Physical Activity;Able to understand and use rate of perceived exertion (RPE) scale       Comments Patient able to understand and use RPE scale appropriately.       Expected Outcomes Progress workloads as tolerated to help increase strength and stamina.                 Discharge Exercise Prescription (Final Exercise Prescription Changes):  Exercise Prescription Changes - 04/19/21 1351       Response to Exercise   Blood Pressure (Admit) 114/70    Blood Pressure (Exercise) 138/68    Blood Pressure (Exit) 114/60    Heart Rate (Admit) 84 bpm    Heart Rate (Exercise) 80 bpm    Heart Rate (Exit) 55 bpm    Rating of Perceived Exertion (Exercise) 11    Symptoms None    Comments Off to a good start with exercise.    Duration Progress to 30 minutes of  aerobic without signs/symptoms of physical distress    Intensity THRR unchanged      Progression   Progression Continue to progress workloads to maintain intensity without signs/symptoms of physical distress.    Average METs 2      Resistance Training   Training Prescription Yes    Weight 2 lbs    Reps 10-15    Time 10 Minutes      Interval Training   Interval Training No  T5 Nustep   Level 1    SPM 85    Minutes 25    METs 2              Nutrition:  Target Goals: Understanding of nutrition guidelines, daily intake of sodium 1500mg , cholesterol 200mg , calories 30% from fat and 7% or less from saturated fats, daily to have 5 or more servings of fruits and vegetables.  Biometrics:  Pre Biometrics - 04/13/21 1315       Pre Biometrics   Waist Circumference 38.5 inches    Hip Circumference 39 inches    Waist to Hip Ratio 0.99 %    Triceps Skinfold 15 mm    % Body Fat 25.4 %    Grip Strength 34 kg    Flexibility --   Not performed due to pinched nerve.   Single Leg Stand 5.81 seconds              Nutrition Therapy Plan and Nutrition Goals:   Nutrition Assessments:  MEDIFICTS Score Key: ?70 Need to make dietary changes  40-70 Heart Healthy Diet ? 40 Therapeutic Level Cholesterol Diet   Picture Your Plate Scores: <53 Unhealthy dietary pattern with much room for improvement. 41-50 Dietary pattern unlikely to meet recommendations for good health and room for improvement. 51-60 More healthful dietary pattern, with some room for improvement.  >60 Healthy dietary pattern, although there may be some specific behaviors that could be improved.    Nutrition Goals Re-Evaluation:   Nutrition Goals Discharge (Final Nutrition Goals Re-Evaluation):   Psychosocial: Target Goals: Acknowledge presence or absence of significant depression and/or stress, maximize coping skills, provide positive support system. Participant is able to verbalize types and ability to use techniques and skills needed for reducing stress and depression.  Initial Review & Psychosocial Screening:  Initial Psych Review & Screening - 04/13/21 1609       Initial Review   Current issues with History of Depression   has early stage dementia. On Aricept. On an antidepressant for depression. Denies being currently depressed     Family Dynamics   Good Support System? Yes   Rayshaun has his wife and son who is a professor at [employer]    Psychosocial barriers to participate in program The patient should benefit from training in stress management and relaxation.      Screening Interventions   Interventions Encouraged to exercise;To provide support and resources with identified psychosocial needs    Expected Outcomes Long Term Goal: Stressors or current issues are controlled or eliminated.;Short Term goal: Identification and review with participant of any Quality of Life or Depression concerns found by scoring the questionnaire.             Quality of Life Scores:  Quality of Life - 04/13/21 1455       Quality of Life   Select Quality of Life      Quality of Life Scores   Health/Function Pre 22.43 %    Socioeconomic Pre 26.5 %    Psych/Spiritual Pre 25.36 %    Family Pre 28.8 %    GLOBAL Pre 24.81 %            Scores of 19 and below usually indicate a poorer quality of life in these areas.  A difference of  2-3 points is a clinically meaningful difference.  A difference of 2-3 points in the total score of the Quality of Life Index has been associated with  significant improvement in overall quality of life, self-image, physical symptoms, and general health in studies assessing change in quality of life.  PHQ-9: Recent Review Flowsheet Data     Depression screen [facility] 2/9 04/13/2021   Decreased Interest 0   Down, Depressed, Hopeless 0   PHQ - 2 Score 0      Interpretation of Total Score  Total Score Depression Severity:  1-4 = Minimal depression, 5-9 = Mild depression, 10-14 = Moderate depression, 15-19 = Moderately severe depression, 20-27 = Severe depression   Psychosocial Evaluation and Intervention:   Psychosocial Re-Evaluation:  Psychosocial Re-Evaluation     [place] Name 04/27/21 1608             Psychosocial Re-Evaluation   Current issues with History of Depression       Comments Owain has not voiced any increased concerns or stressors or increase in depr       Expected Outcomes Nickalaus  will have decreased depression upon completion of phase 2 cardiac rehab.       Interventions Encouraged to attend Cardiac Rehabilitation for the exercise;Stress management education       Continue Psychosocial Services  No Follow up required                Psychosocial Discharge (Final Psychosocial Re-Evaluation):  Psychosocial Re-Evaluation - 04/27/21 1608       Psychosocial Re-Evaluation   Current issues with History of Depression    Comments Kwadwo has not voiced any increased concerns or stressors or increase in depr    Expected Outcomes Colter will have decreased depression upon completion of phase 2 cardiac rehab.    Interventions Encouraged to attend Cardiac Rehabilitation for the exercise;Stress management education    Continue Psychosocial Services  No Follow up required             Vocational Rehabilitation: Provide vocational rehab assistance to qualifying candidates.   Vocational Rehab Evaluation & Intervention:  Vocational Rehab - 04/13/21 1614       Initial Vocational Rehab Evaluation & Intervention   Assessment shows need for Vocational Rehabilitation No   Ranier is retired and does not need vocational rehab at this time            Education: Education Goals: Education classes will be provided on a weekly basis, covering required topics. Participant will state understanding/return demonstration of topics presented.  Learning Barriers/Preferences:  Learning Barriers/Preferences - 04/13/21 1612       Learning Barriers/Preferences   Learning Barriers Inability to learn new things;Exercise Concerns;Sight   Wears glasses has ealy onset dementia. has cervical disk disease needs surgery will have soon.   Learning Preferences Skilled Demonstration;Verbal Instruction;Video;Individual Instruction;Pictoral             Education Topics: Hypertension, Hypertension Reduction -Define heart disease and high blood pressure. Discus how high blood pressure  affects the body and ways to reduce high blood pressure.   Exercise and Your Heart -Discuss why it is important to exercise, the FITT principles of exercise, normal and abnormal responses to exercise, and how to exercise safely.   Angina -Discuss definition of angina, causes of angina, treatment of angina, and how to decrease risk of having angina.   Cardiac Medications -Review what the following cardiac medications are used for, how they affect the body, and side effects that may occur when taking the medications.  Medications include Aspirin, Beta blockers, calcium channel blockers, ACE Inhibitors, angiotensin receptor blockers, diuretics, digoxin, and antihyperlipidemics.   Congestive  Heart Failure -Discuss the definition of CHF, how to live with CHF, the signs and symptoms of CHF, and how keep track of weight and sodium intake.   Heart Disease and Intimacy -Discus the effect sexual activity has on the heart, how changes occur during intimacy as we age, and safety during sexual activity.   Smoking Cessation / COPD -Discuss different methods to quit smoking, the health benefits of quitting smoking, and the definition of COPD.   Nutrition I: Fats -Discuss the types of cholesterol, what cholesterol does to the heart, and how cholesterol levels can be controlled.   Nutrition II: Labels -Discuss the different components of food labels and how to read food label   Heart Parts/Heart Disease and PAD -Discuss the anatomy of the heart, the pathway of blood circulation through the heart, and these are affected by heart disease.   Stress I: Signs and Symptoms -Discuss the causes of stress, how stress may lead to anxiety and depression, and ways to limit stress.   Stress II: Relaxation -Discuss different types of relaxation techniques to limit stress.   Warning Signs of Stroke / TIA -Discuss definition of a stroke, what the signs and symptoms are of a stroke, and how to identify  when someone is having stroke.   Knowledge Questionnaire Score:  Knowledge Questionnaire Score - 04/13/21 1452       Knowledge Questionnaire Score   Pre Score 19/24             Core Components/Risk Factors/Patient Goals at Admission:  Personal Goals and Risk Factors at Admission - 04/13/21 1319       Core Components/Risk Factors/Patient Goals on Admission    Weight Management Yes;Weight Maintenance    Intervention Weight Management: Develop a combined nutrition and exercise program designed to reach desired caloric intake, while maintaining appropriate intake of nutrient and fiber, sodium and fats, and appropriate energy expenditure required for the weight goal.;Weight Management: Provide education and appropriate resources to help participant work on and attain dietary goals.    Expected Outcomes Weight Maintenance: Understanding of the daily nutrition guidelines, which includes 25-35% calories from fat, 7% or less cal from saturated fats, less than 200mg  cholesterol, less than 1.5gm of sodium, & 5 or more servings of fruits and vegetables daily;Weight Loss: Understanding of general recommendations for a balanced deficit meal plan, which promotes 1-2 lb weight loss per week and includes a negative energy balance of [phone] kcal/d;Understanding recommendations for meals to include 15-35% energy as protein, 25-35% energy from fat, 35-60% energy from carbohydrates, less than 200mg  of dietary cholesterol, 20-35 gm of total fiber daily    Diabetes Yes    Intervention Provide education about signs/symptoms and action to take for hypo/hyperglycemia.;Provide education about proper nutrition, including hydration, and aerobic/resistive exercise prescription along with prescribed medications to achieve blood glucose in normal ranges: Fasting glucose 65-99 mg/dL    Expected Outcomes Short Term: Participant verbalizes understanding of the signs/symptoms and immediate care of hyper/hypoglycemia, proper  foot care and importance of medication, aerobic/resistive exercise and nutrition plan for blood glucose control.;Long Term: Attainment of HbA1C < 7%.    Hypertension Yes    Intervention Provide education on lifestyle modifcations including regular physical activity/exercise, weight management, moderate sodium restriction and increased consumption of fresh fruit, vegetables, and low fat dairy, alcohol moderation, and smoking cessation.;Monitor prescription use compliance.    Expected Outcomes Short Term: Continued assessment and intervention until BP is < 140/22mm HG in hypertensive participants. < 130/42mm HG in hypertensive  participants with diabetes, heart failure or chronic kidney disease.;Long Term: Maintenance of blood pressure at goal levels.    Lipids Yes    Intervention Provide education and support for participant on nutrition & aerobic/resistive exercise along with prescribed medications to achieve LDL 70mg , HDL >40mg .    Expected Outcomes Short Term: Participant states understanding of desired cholesterol values and is compliant with medications prescribed. Participant is following exercise prescription and nutrition guidelines.;Long Term: Cholesterol controlled with medications as prescribed, with individualized exercise RX and with personalized nutrition plan. Value goals: LDL < 70mg , HDL > 40 mg.    Stress Yes    Intervention Offer individual and/or small group education and counseling on adjustment to heart disease, stress management and health-related lifestyle change. Teach and support self-help strategies.;Refer participants experiencing significant psychosocial distress to appropriate mental health specialists for further evaluation and treatment. When possible, include family members and significant others in education/counseling sessions.    Expected Outcomes Short Term: Participant demonstrates changes in health-related behavior, relaxation and other stress management skills, ability  to obtain effective social support, and compliance with psychotropic medications if prescribed.;Long Term: Emotional wellbeing is indicated by absence of clinically significant psychosocial distress or social isolation.             Core Components/Risk Factors/Patient Goals Review:   Goals and Risk Factor Review     Row Name 04/19/21 1557             Core Components/Risk Factors/Patient Goals Review   Personal Goals Review Weight Management/Obesity;Stress;Hypertension;Lipids       Review Timber started exercise at cardiac rehab on 04/19/21 and did well with exercise. Vital signs and CBG's were stable       Expected Outcomes Kainalu will continue to participate in phase 2 cardiac rehab for exervcise, nutrition and lifestyle modifications.                Core Components/Risk Factors/Patient Goals at Discharge (Final Review):   Goals and Risk Factor Review - 04/19/21 1557       Core Components/Risk Factors/Patient Goals Review   Personal Goals Review Weight Management/Obesity;Stress;Hypertension;Lipids    Review Tudor started exercise at cardiac rehab on 04/19/21 and did well with exercise. Vital signs and CBG's were stable    Expected Outcomes Ayinde will continue to participate in phase 2 cardiac rehab for exervcise, nutrition and lifestyle modifications.             ITP Comments:  ITP Comments     Row Name 04/13/21 1315 04/19/21 1555 04/27/21 1607       ITP Comments Medical Director- Dr. Fransico Him, MD 30 Day ITP Review. Donaciano started exercise at cardiac rehab on 04/19/21 and did well with exercise. 30 Day ITP Review. Almon is off to a good start to exercise at cardiac rehab and has good participation.              Comments: See ITP comments.Harrell Gave RN BSN

## 2021-04-28 ENCOUNTER — Encounter (HOSPITAL_COMMUNITY)
Admission: RE | Admit: 2021-04-28 | Discharge: 2021-04-28 | Disposition: A | Discharge status: Home / Self Care | Payer: Medicare HMO | Source: Ambulatory Visit | Attending: Interventional Cardiology | Admitting: Interventional Cardiology

## 2021-04-28 ENCOUNTER — Other Ambulatory Visit: Payer: Self-pay

## 2021-04-28 DIAGNOSIS — I214 Non-ST elevation (NSTEMI) myocardial infarction: Secondary | ICD-10-CM | POA: Diagnosis not present

## 2021-04-28 DIAGNOSIS — Z951 Presence of aortocoronary bypass graft: Secondary | ICD-10-CM | POA: Insufficient documentation

## 2021-04-29 DIAGNOSIS — L4050 Arthropathic psoriasis, unspecified: Secondary | ICD-10-CM | POA: Diagnosis not present

## 2021-04-29 DIAGNOSIS — Z79899 Other long term (current) drug therapy: Secondary | ICD-10-CM | POA: Diagnosis not present

## 2021-04-30 ENCOUNTER — Encounter (HOSPITAL_COMMUNITY)
Admission: RE | Admit: 2021-04-30 | Discharge: 2021-04-30 | Disposition: A | Discharge status: Home / Self Care | Payer: Medicare HMO | Source: Ambulatory Visit | Attending: Interventional Cardiology | Admitting: Interventional Cardiology

## 2021-04-30 ENCOUNTER — Other Ambulatory Visit: Payer: Self-pay

## 2021-04-30 DIAGNOSIS — I214 Non-ST elevation (NSTEMI) myocardial infarction: Secondary | ICD-10-CM | POA: Diagnosis not present

## 2021-04-30 DIAGNOSIS — Z951 Presence of aortocoronary bypass graft: Secondary | ICD-10-CM | POA: Diagnosis not present

## 2021-05-03 ENCOUNTER — Encounter (HOSPITAL_COMMUNITY)
Admission: RE | Admit: 2021-05-03 | Discharge: 2021-05-03 | Disposition: A | Discharge status: Home / Self Care | Payer: Medicare HMO | Source: Ambulatory Visit | Attending: Interventional Cardiology | Admitting: Interventional Cardiology

## 2021-05-03 ENCOUNTER — Other Ambulatory Visit: Payer: Self-pay

## 2021-05-03 DIAGNOSIS — I214 Non-ST elevation (NSTEMI) myocardial infarction: Secondary | ICD-10-CM | POA: Diagnosis not present

## 2021-05-03 DIAGNOSIS — Z951 Presence of aortocoronary bypass graft: Secondary | ICD-10-CM | POA: Diagnosis not present

## 2021-05-03 NOTE — Pre-Procedure Instructions (Signed)
Surgical Instructions    Your procedure is scheduled on Wednesday, November 16th.  Report to [facility] Main Entrance "A" at 09:40 A.M., then check in with the Admitting office.  Call this number if you have problems the morning of surgery:  [phone]   If you have any questions prior to your surgery date call [phone]: Open Monday-Friday 8am-4pm    Remember:  Do not eat after midnight the night before your surgery  You may drink clear liquids until 08:40 AM the morning of your surgery.   Clear liquids allowed are: Water, Non-Citrus Juices (without pulp), Carbonated Beverages, Clear Tea, Black Coffee Only, and Gatorade   Patient Instructions  The night before surgery:  No food after midnight. ONLY clear liquids after midnight  The day of surgery (if you have diabetes): Drink ONE (1) 12 oz G2 given to you in your pre admission testing appointment by 08:40 AM the morning of surgery. Drink in one sitting. Do not sip.  This drink was given to you during your hospital  pre-op appointment visit.  Nothing else to drink after completing the  12 oz bottle of G2.         If you have questions, please contact your surgeon's office.     Take these medicines the morning of surgery with A SIP OF WATER  fenofibrate  gabapentin (NEURONTIN)  simvastatin (ZOCOR) venlafaxine XR (EFFEXOR-XR)   If needed: acetaminophen (TYLENOL) polyvinyl alcohol (LIQUIFILM TEARS)    DO NOT TAKE methotrexate (RHEUMATREX) on day of surgery  As of today, STOP taking any Aspirin (unless otherwise instructed by your surgeon) Aleve, Naproxen, Ibuprofen, Motrin, Advil, Goody's, BC's, all herbal medications, fish oil, and all vitamins.   WHAT DO I DO ABOUT MY DIABETES MEDICATION?   Do not take empagliflozin (JARDIANCE) the day before surgery (11/15) Take AM dose only of glimepiride (AMARYL). Do not take PM dose on (11/15)  Do not take empagliflozin (JARDIANCE), metFORMIN (GLUCOPHAGE) or glimepiride  (AMARYL) the day of surgery (11/16).       HOW TO MANAGE YOUR DIABETES BEFORE AND AFTER SURGERY  Why is it important to control my blood sugar before and after surgery? Improving blood sugar levels before and after surgery helps healing and can limit problems. A way of improving blood sugar control is eating a healthy diet by:  Eating less sugar and carbohydrates  Increasing activity/exercise  Talking with your doctor about reaching your blood sugar goals High blood sugars (greater than 180 mg/dL) can raise your risk of infections and slow your recovery, so you will need to focus on controlling your diabetes during the weeks before surgery. Make sure that the doctor who takes care of your diabetes knows about your planned surgery including the date and location.  How do I manage my blood sugar before surgery? Check your blood sugar at least 4 times a day, starting 2 days before surgery, to make sure that the level is not too high or low.  Check your blood sugar the morning of your surgery when you wake up and every 2 hours until you get to the Short Stay unit.  If your blood sugar is less than 70 mg/dL, you will need to treat for low blood sugar: Do not take insulin. Treat a low blood sugar (less than 70 mg/dL) with  cup of clear juice (cranberry or apple), 4 glucose tablets, OR glucose gel. Recheck blood sugar in 15 minutes after treatment (to make sure it is greater than 70 mg/dL).  If your blood sugar is not greater than 70 mg/dL on recheck, call [phone] for further instructions. Report your blood sugar to the short stay nurse when you get to Short Stay.  If you are admitted to the hospital after surgery: Your blood sugar will be checked by the staff and you will probably be given insulin after surgery (instead of oral diabetes medicines) to make sure you have good blood sugar levels. The goal for blood sugar control after surgery is 80-180 mg/dL.                   Do NOT Smoke  (Tobacco/Vaping) or drink Alcohol 24 hours prior to your procedure.  If you use a CPAP at night, you may bring all equipment for your overnight stay.   Contacts, glasses, piercing's, hearing aid's, dentures or partials may not be worn into surgery, please bring cases for these belongings.    For patients admitted to the hospital, discharge time will be determined by your treatment team.   Patients discharged the day of surgery will not be allowed to drive home, and someone needs to stay with them for 24 hours.  NO VISITORS WILL BE ALLOWED IN PRE-OP WHERE PATIENTS GET READY FOR SURGERY.  ONLY 1 SUPPORT PERSON MAY BE PRESENT IN THE WAITING ROOM WHILE YOU ARE IN SURGERY.  IF YOU ARE TO BE ADMITTED, ONCE YOU ARE IN YOUR ROOM YOU WILL BE ALLOWED TWO (2) VISITORS.  Minor children may have two parents present. Special consideration for safety and communication needs will be reviewed on a case by case basis.   Special instructions:   [place]- Preparing For Surgery  Before surgery, you can play an important role. Because skin is not sterile, your skin needs to be as free of germs as possible. You can reduce the number of germs on your skin by washing with CHG (chlorahexidine gluconate) Soap before surgery.  CHG is an antiseptic cleaner which kills germs and bonds with the skin to continue killing germs even after washing.    Oral Hygiene is also important to reduce your risk of infection.  Remember - BRUSH YOUR TEETH THE MORNING OF SURGERY WITH YOUR REGULAR TOOTHPASTE  Please do not use if you have an allergy to CHG or antibacterial soaps. If your skin becomes reddened/irritated stop using the CHG.  Do not shave (including legs and underarms) for at least 48 hours prior to first CHG shower. It is OK to shave your face.  Please follow these instructions carefully.   Shower the NIGHT BEFORE SURGERY and the MORNING OF SURGERY  If you chose to wash your hair, wash your hair first as usual with your  normal shampoo.  After you shampoo, rinse your hair and body thoroughly to remove the shampoo.  Use CHG Soap as you would any other liquid soap. You can apply CHG directly to the skin and wash gently with a scrungie or a clean washcloth.   Apply the CHG Soap to your body ONLY FROM THE NECK DOWN.  Do not use on open wounds or open sores. Avoid contact with your eyes, ears, mouth and genitals (private parts). Wash Face and genitals (private parts)  with your normal soap.   Wash thoroughly, paying special attention to the area where your surgery will be performed.  Thoroughly rinse your body with warm water from the neck down.  DO NOT shower/wash with your normal soap after using and rinsing off the CHG Soap.  Pat yourself  dry with a CLEAN TOWEL.  Wear CLEAN PAJAMAS to bed the night before surgery  Place CLEAN SHEETS on your bed the night before your surgery  DO NOT SLEEP WITH PETS.   Day of Surgery: Shower with CHG soap. Do not wear jewelry Do not wear lotions, powders, perfumes, or deodorant. Men may shave face and neck. Do not bring valuables to the hospital. [facility] is not responsible for any belongings or valuables. Wear Clean/Comfortable clothing the morning of surgery Remember to brush your teeth WITH YOUR REGULAR TOOTHPASTE.   Please read over the following fact sheets that you were given.   3 days prior to your procedure or After your COVID test   You are not required to quarantine however you are required to wear a well-fitting mask when you are out and around people not in your household. If your mask becomes wet or soiled, replace with a new one.   Wash your hands often with soap and water for 20 seconds or clean your hands with an alcohol-based hand sanitizer that contains at least 60% alcohol.   Do not share personal items.   Notify your provider:  o if you are in close contact with someone who has COVID  o or if you develop a fever of 100.4 or  greater, sneezing, cough, sore throat, shortness of breath or body aches.

## 2021-05-04 ENCOUNTER — Encounter (HOSPITAL_COMMUNITY): Payer: Self-pay

## 2021-05-04 ENCOUNTER — Encounter (HOSPITAL_COMMUNITY)
Admission: RE | Admit: 2021-05-04 | Discharge: 2021-05-04 | Disposition: A | Discharge status: Home / Self Care | Payer: Medicare HMO | Source: Ambulatory Visit | Attending: Orthopedic Surgery | Admitting: Orthopedic Surgery

## 2021-05-04 VITALS — BP 132/72 | HR 79 | Temp 97.6°F | Resp 18 | Ht 73.0 in | Wt 187.9 lb

## 2021-05-04 DIAGNOSIS — Z01818 Encounter for other preprocedural examination: Secondary | ICD-10-CM

## 2021-05-04 DIAGNOSIS — I10 Essential (primary) hypertension: Secondary | ICD-10-CM | POA: Insufficient documentation

## 2021-05-04 DIAGNOSIS — Z01812 Encounter for preprocedural laboratory examination: Secondary | ICD-10-CM | POA: Insufficient documentation

## 2021-05-04 DIAGNOSIS — M4722 Other spondylosis with radiculopathy, cervical region: Secondary | ICD-10-CM | POA: Insufficient documentation

## 2021-05-04 DIAGNOSIS — I252 Old myocardial infarction: Secondary | ICD-10-CM | POA: Insufficient documentation

## 2021-05-04 DIAGNOSIS — E785 Hyperlipidemia, unspecified: Secondary | ICD-10-CM | POA: Diagnosis not present

## 2021-05-04 DIAGNOSIS — Z951 Presence of aortocoronary bypass graft: Secondary | ICD-10-CM | POA: Insufficient documentation

## 2021-05-04 DIAGNOSIS — I2510 Atherosclerotic heart disease of native coronary artery without angina pectoris: Secondary | ICD-10-CM | POA: Insufficient documentation

## 2021-05-04 DIAGNOSIS — I082 Rheumatic disorders of both aortic and tricuspid valves: Secondary | ICD-10-CM | POA: Diagnosis not present

## 2021-05-04 DIAGNOSIS — E119 Type 2 diabetes mellitus without complications: Secondary | ICD-10-CM | POA: Diagnosis not present

## 2021-05-04 HISTORY — DX: Unspecified osteoarthritis, unspecified site: M19.90

## 2021-05-04 HISTORY — DX: Depression, unspecified: F32.A

## 2021-05-04 LAB — SURGICAL PCR SCREEN
MRSA, PCR: NEGATIVE
Staphylococcus aureus: NEGATIVE

## 2021-05-04 LAB — COMPREHENSIVE METABOLIC PANEL
ALT: 12 U/L (ref 0–44)
AST: 18 U/L (ref 15–41)
Albumin: 3.7 g/dL (ref 3.5–5.0)
Alkaline Phosphatase: 39 U/L (ref 38–126)
Anion gap: 6 (ref 5–15)
BUN: 17 mg/dL (ref 8–23)
CO2: 30 mmol/L (ref 22–32)
Calcium: 9.5 mg/dL (ref 8.9–10.3)
Chloride: 102 mmol/L (ref 98–111)
Creatinine, Ser: 1.23 mg/dL (ref 0.61–1.24)
GFR, Estimated: >60 mL/min (ref >60)
Glucose, Bld: 210 mg/dL — ABNORMAL HIGH (ref 70–99)
Potassium: 4.7 mmol/L (ref 3.5–5.1)
Sodium: 138 mmol/L (ref 135–145)
Total Bilirubin: 0.4 mg/dL (ref 0.3–1.2)
Total Protein: 6.3 g/dL — ABNORMAL LOW (ref 6.5–8.1)

## 2021-05-04 LAB — URINALYSIS, ROUTINE W REFLEX MICROSCOPIC
Bacteria, UA: NONE SEEN
Bilirubin Urine: NEGATIVE
Glucose, UA: >=500 mg/dL — AB
Hgb urine dipstick: NEGATIVE
Ketones, ur: NEGATIVE mg/dL
Leukocytes,Ua: NEGATIVE
Nitrite: NEGATIVE
Protein, ur: NEGATIVE mg/dL
Specific Gravity, Urine: 1.022 (ref 1.005–1.030)
pH: 5 (ref 5.0–8.0)

## 2021-05-04 LAB — CBC WITH DIFFERENTIAL/PLATELET
Abs Immature Granulocytes: 0.02 10*3/uL (ref 0.00–0.07)
Basophils Absolute: 0 10*3/uL (ref 0.0–0.1)
Basophils Relative: 0 %
Eosinophils Absolute: 0.4 10*3/uL (ref 0.0–0.5)
Eosinophils Relative: 7 %
HCT: 46.8 % (ref 39.0–52.0)
Hemoglobin: 14.4 g/dL (ref 13.0–17.0)
Immature Granulocytes: 0 %
Lymphocytes Relative: 31 %
Lymphs Abs: 1.7 10*3/uL (ref 0.7–4.0)
MCH: 26.9 pg (ref 26.0–34.0)
MCHC: 30.8 g/dL (ref 30.0–36.0)
MCV: 87.5 fL (ref 80.0–100.0)
Monocytes Absolute: 0.6 10*3/uL (ref 0.1–1.0)
Monocytes Relative: 11 %
Neutro Abs: 2.7 10*3/uL (ref 1.7–7.7)
Neutrophils Relative %: 51 %
Platelets: 254 10*3/uL (ref 150–400)
RBC: 5.35 MIL/uL (ref 4.22–5.81)
RDW: 15.1 % (ref 11.5–15.5)
WBC: 5.4 10*3/uL (ref 4.0–10.5)
nRBC: 0 % (ref 0.0–0.2)

## 2021-05-04 LAB — PROTIME-INR
INR: 1.1 (ref 0.8–1.2)
Prothrombin Time: 14.1 seconds (ref 11.4–15.2)

## 2021-05-04 LAB — TYPE AND SCREEN
ABO/RH(D): AB POS
Antibody Screen: NEGATIVE

## 2021-05-04 LAB — APTT: aPTT: 24 seconds (ref 24–36)

## 2021-05-04 LAB — HEMOGLOBIN A1C
Hgb A1c MFr Bld: 6.5 % — ABNORMAL HIGH (ref 4.8–5.6)
Mean Plasma Glucose: 139.85 mg/dL

## 2021-05-04 LAB — GLUCOSE, CAPILLARY: Glucose-Capillary: 267 mg/dL — ABNORMAL HIGH (ref 70–99)

## 2021-05-04 NOTE — Progress Notes (Signed)
PCP - Dr. Emily Filbert Cardiologist - Dr. Daneen Schick- clearance obtained  PPM/ICD - denies   Chest x-ray - 02/26/21 EKG - 02/02/21 Stress Test - pt states maybe 10+ years ago, no abnormalities ECHO - 01/19/21 Cardiac Cath - 01/17/21  Sleep Study - denies  DM- Type 2 Fasting Blood Sugar - 80-100 Checks Blood Sugar 1-2 times a day  Blood Thinner Instructions: n/a Aspirin Instructions: stopped ASA 11/2 per instructions  ERAS Protcol - yes PRE-SURGERY G2- given  COVID TEST- n/a, ambulatory surgery   Anesthesia review: yes, cardiac hx  Patient denies shortness of breath, fever, cough and chest pain at PAT appointment   All instructions explained to the patient, with a verbal understanding of the material. Patient agrees to go over the instructions while at home for a better understanding. Patient also instructed to wear a mask in public for 3 days prior to surgery. The opportunity to ask questions was provided.

## 2021-05-05 ENCOUNTER — Other Ambulatory Visit: Payer: Self-pay

## 2021-05-05 ENCOUNTER — Encounter (HOSPITAL_COMMUNITY)
Admission: RE | Admit: 2021-05-05 | Discharge: 2021-05-05 | Disposition: A | Discharge status: Home / Self Care | Payer: Medicare HMO | Source: Ambulatory Visit | Attending: Interventional Cardiology | Admitting: Interventional Cardiology

## 2021-05-05 DIAGNOSIS — I214 Non-ST elevation (NSTEMI) myocardial infarction: Secondary | ICD-10-CM

## 2021-05-05 DIAGNOSIS — Z951 Presence of aortocoronary bypass graft: Secondary | ICD-10-CM | POA: Diagnosis not present

## 2021-05-06 ENCOUNTER — Encounter (HOSPITAL_COMMUNITY): Payer: Self-pay

## 2021-05-06 DIAGNOSIS — E538 Deficiency of other specified B group vitamins: Secondary | ICD-10-CM | POA: Diagnosis not present

## 2021-05-06 DIAGNOSIS — E119 Type 2 diabetes mellitus without complications: Secondary | ICD-10-CM | POA: Diagnosis not present

## 2021-05-06 DIAGNOSIS — Z125 Encounter for screening for malignant neoplasm of prostate: Secondary | ICD-10-CM | POA: Diagnosis not present

## 2021-05-06 DIAGNOSIS — I2510 Atherosclerotic heart disease of native coronary artery without angina pectoris: Secondary | ICD-10-CM | POA: Diagnosis not present

## 2021-05-06 DIAGNOSIS — E1151 Type 2 diabetes mellitus with diabetic peripheral angiopathy without gangrene: Secondary | ICD-10-CM | POA: Diagnosis not present

## 2021-05-06 DIAGNOSIS — L4050 Arthropathic psoriasis, unspecified: Secondary | ICD-10-CM | POA: Diagnosis not present

## 2021-05-06 DIAGNOSIS — Z79899 Other long term (current) drug therapy: Secondary | ICD-10-CM | POA: Diagnosis not present

## 2021-05-06 DIAGNOSIS — R972 Elevated prostate specific antigen [PSA]: Secondary | ICD-10-CM | POA: Diagnosis not present

## 2021-05-06 DIAGNOSIS — M159 Polyosteoarthritis, unspecified: Secondary | ICD-10-CM | POA: Diagnosis not present

## 2021-05-06 DIAGNOSIS — M5030 Other cervical disc degeneration, unspecified cervical region: Secondary | ICD-10-CM | POA: Diagnosis not present

## 2021-05-06 NOTE — Progress Notes (Signed)
Anesthesia Chart Review:  Case: [id number] Date/Time: 05/12/21 1225   Procedure: ANTERIOR CERVICAL DECOMPRESSION FUSION CERVICAL 5- CERVICAL 6 WITH INSTRUMENTATION AND ALLOGRAFT   Anesthesia type: General   Pre-op diagnosis: CERVICAL RADICULOPATHY   Location: [facility] OR ROOM 05 / [place] OR   Surgeons: Phylliss Bob, MD       DISCUSSION: Patient is a 75 year old male scheduled for the above procedure.  History includes never smoker, CAD (RCA stent 03/1998; STEMI, s/p CABG x4 01/19/21: LIMA-LAD, SVG-PLA, SVG-OM1, SVG-D1), HTN, HLD, DM2, psoriatic arthritis. 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   Preoperative cardiology input outlined by Roby Lofts, PA-C on 04/23/2021, "Phoenix Dresser Milanese was last seen on 03/09/21 by Dr. Tamala Julian.  Since that day, Treyven Lafauci has done well from a cardiac standpoint. He has been working with cardiac rehab. He can complete 4 METS without anginal complaints   Therefore, based on ACC/AHA guidelines, the patient would be at acceptable risk for the planned procedure without further cardiovascular testing.    The patient was advised that if he develops new symptoms prior to surgery to contact our office to arrange for a follow-up visit, and he verbalized understanding.   Per Dr. Tamala Julian, patient can hold aspirin 5-7 days prior to his upcoming procedure with plans to restart as soon as he is cleared to do so by his surgeon." He reported his last aspirin as 04/28/2021.   VS: BP 132/72   Pulse 79   Temp 36.4 C (Oral)   Resp 18   Ht 6\' 1"  (1.854 m)   Wt 85.2 kg   SpO2 99%   BMI 24.79 kg/m    PROVIDERS: Rusty Aus, MD is PCP - Daneen Schick, MD is cardiologist.  Last office visit 03/09/2021.  Patient felt to be doing "quite well post bypass surgery."  He added, "if cervical disc surgery is needed, he is now at a point where he could be cleared from cardiac  standpoint." - Melodie Bouillon, MD is CT surgeon.  Last office visit 02/26/2021.  As needed follow-up recommended. Jennings Books, MD is neurologist last visit 12/23/2020 for mild cognitive impairment, right index resting tremor, imbalance. Liliane Bade., MD is rheumatologist   LABS: Labs reviewed: Acceptable for surgery. (all labs ordered are listed, but only abnormal results are displayed)  Labs Reviewed  GLUCOSE, CAPILLARY - Abnormal; Notable for the following components:      Result Value   Glucose-Capillary 267 (*)    All other components within normal limits  COMPREHENSIVE METABOLIC PANEL - Abnormal; Notable for the following components:   Glucose, Bld 210 (*)    Total Protein 6.3 (*)    All other components within normal limits  URINALYSIS, ROUTINE W REFLEX MICROSCOPIC - Abnormal; Notable for the following components:   Color, Urine STRAW (*)    Glucose, UA >=500 (*)    All other components within normal limits  HEMOGLOBIN A1C - Abnormal; Notable for the following components:   Hgb A1c MFr Bld 6.5 (*)    All other components within normal limits  SURGICAL PCR SCREEN  CBC WITH DIFFERENTIAL/PLATELET  PROTIME-INR  APTT  TYPE AND SCREEN     IMAGES: CXR 02/26/21: FINDINGS: Improved aeration. No remaining pleural fluid and no visible pneumothorax. Unremarkable heart size and mediastinal contours. IMPRESSION: Resolved postoperative changes.  No evidence of active disease.   MRI C-spine 01/14/21 (  [facility], [place]): IMPRESSION: - No acute abnormality or evidence of trauma. - Degenerative change most notable at C5-6 where disc osteophyte complex and uncovertebral disease cause mild deformity of the ventral cord and right worse than left moderate to moderately severe foraminal narrowing.   EKG: 02/02/21 ([facility]): Sinus tachycardia at 105 bpm Inferior infarct, old LAHB   CV: TEE 01/19/21 (Intra-op CABG): - Left Ventricle: The left ventricle has normal  systolic function, with an  ejection fraction of 55-60%. The cavity size was normal. There is moderate  concentric left ventricular hypertrophy. There is moderate concentric left  ventricular hypertrophy.  - Right Ventricle: The right ventricle has normal systolic function. The  cavity was normal. There is no increase in right ventricular wall  thickness.  - Left Atrium: Left atrial size was normal in size. No left atrial/left  atrial appendage thrombus was detected.  - Right Atrium: Right atrial size was normal in size.  - Interatrial Septum: No atrial level shunt detected by color flow Doppler.  - Pericardium: There is no evidence of pericardial effusion.  - Mitral Valve: The mitral valve is normal in structure. Mitral valve  regurgitation is mild by color flow Doppler. There is No evidence of  mitral stenosis.  - Tricuspid Valve: The tricuspid valve was normal in structure. Tricuspid  valve regurgitation was not visualized by color flow Doppler.  - Aortic Valve: The aortic valve is tricuspid Aortic valve regurgitation was  not visualized by color flow Doppler. There is no stenosis of the aortic  valve.  - Pulmonic Valve: The pulmonic valve was normal in structure.  Pulmonic valve regurgitation is not visualized by color flow Doppler.    TTE 01/18/21: IMPRESSIONS   1. Left ventricular ejection fraction, by estimation, is 55 to 60%. The  left ventricle has normal function. Left ventricular endocardial border  not optimally defined to evaluate regional wall motion. There is moderate  left ventricular hypertrophy. Left  ventricular diastolic parameters are consistent with Grade I diastolic  dysfunction (impaired relaxation).   2. Right ventricular systolic function is normal. The right ventricular  size is normal. Tricuspid regurgitation signal is inadequate for assessing  PA pressure.   3. The mitral valve is normal in structure. No evidence of mitral valve  regurgitation. No  evidence of mitral stenosis.   4. The aortic valve is tricuspid. Aortic valve regurgitation is not  visualized. No aortic stenosis is present.   5. The inferior vena cava is normal in size with greater than 50%  respiratory variability, suggesting right atrial pressure of 3 mmHg.   6. Technically difficult study with poor acoustic windows.    US Carotid 01/18/21: Summary:  - Right Carotid: Velocities in the right ICA are consistent with a 1-39% stenosis.  - Left Carotid: Velocities in the left ICA are consistent with a 1-39% stenosis.  - Vertebrals:  Bilateral vertebral arteries demonstrate antegrade flow.  - Subclavians: Normal flow hemodynamics were seen in bilateral subclavian arteries.    Last LHC pre-CABG on 01/17/2021.   Past Medical History:  Diagnosis Date   Arthritis    pt states he was diagnosed 10+ years ago; psoriatic arthritis   Coronary artery disease    Depression    pt diagnosed 10+ years ago   Diabetes mellitus ([place])    Dysrhythmia 01/17/2021   first degree AV block, 2nd degree AVB, Mobitz I in setting of MI   Hyperlipidemia    Hypertension     Past Surgical History:  Procedure Laterality  Date   CARDIAC CATHETERIZATION     CORONARY ARTERY BYPASS GRAFT N/A 01/19/2021   Procedure: CORONARY ARTERY BYPASS GRAFTING (CABG) TIMES FOUR, ON PUMP, USING LEFT INTERNAL MAMMARY ARTERY AND ENDOSCOPICALLY HARVESTED GREATER SAPHENOUS VEIN;  Surgeon: Lajuana Matte, MD;  Location: [facility];  Service: Open Heart Surgery;  Laterality: N/A;  flow trac   ENDOVEIN HARVEST OF GREATER SAPHENOUS VEIN Right 01/19/2021   Procedure: ENDOVEIN HARVEST OF GREATER SAPHENOUS VEIN;  Surgeon: Lajuana Matte, MD;  Location: [facility];  Service: Open Heart Surgery;  Laterality: Right;   EYE SURGERY Bilateral 2021   cataracts replaced in both eyes   LEFT HEART CATH AND CORONARY ANGIOGRAPHY N/A 01/17/2021   Procedure: LEFT HEART CATH AND CORONARY ANGIOGRAPHY;  Surgeon: Belva Crome, MD;   Location: [facility];  Service: Cardiovascular;  Laterality: N/A;   TEE WITHOUT CARDIOVERSION N/A 01/19/2021   Procedure: TRANSESOPHAGEAL ECHOCARDIOGRAM (TEE);  Surgeon: Lajuana Matte, MD;  Location: [facility];  Service: Open Heart Surgery;  Laterality: N/A;    MEDICATIONS:  acetaminophen (TYLENOL) 500 MG tablet   aspirin EC 81 MG tablet   Cholecalciferol 50 MCG (2000 UT) CAPS   docusate sodium (COLACE) 100 MG capsule   donepezil (ARICEPT) 5 MG tablet   empagliflozin (JARDIANCE) 10 MG TABS tablet   fenofibrate 290 MG tablet   folic acid (FOLVITE) 211 MCG tablet   gabapentin (NEURONTIN) 300 MG capsule   glimepiride (AMARYL) 1 MG tablet   hydrochlorothiazide (MICROZIDE) 12.5 MG capsule   lisinopril (ZESTRIL) 5 MG tablet   metFORMIN (GLUCOPHAGE) 500 MG tablet   methotrexate (RHEUMATREX) 2.5 MG tablet   polyvinyl alcohol (LIQUIFILM TEARS) 1.4 % ophthalmic solution   simvastatin (ZOCOR) 40 MG tablet   venlafaxine XR (EFFEXOR-XR) 37.5 MG 24 hr capsule   vitamin B-12 (CYANOCOBALAMIN) 1000 MCG tablet   No current facility-administered medications for this encounter.    Myra Gianotti, PA-C Surgical Short Stay/Anesthesiology [facility] Phone [phone] [facility] Phone [phone] 05/06/2021 2:04 PM

## 2021-05-06 NOTE — Anesthesia Preprocedure Evaluation (Addendum)
Anesthesia Evaluation  Patient identified by MRN, date of birth, ID band Patient awake    Reviewed: Allergy & Precautions, NPO status , Patient's Chart, lab work & pertinent test results  Airway Mallampati: II  TM Distance: >3 FB Neck ROM: Limited    Dental no notable dental hx.    Pulmonary neg pulmonary ROS,    Pulmonary exam normal breath sounds clear to auscultation       Cardiovascular Exercise Tolerance: Good hypertension, + CAD, + Past MI and + CABG (12/2020)  Normal cardiovascular exam Rhythm:Regular Rate:Normal  NSR rate 105   Neuro/Psych PSYCHIATRIC DISORDERS Depression negative neurological ROS     GI/Hepatic negative GI ROS, Neg liver ROS,   Endo/Other  diabetes, Type 2, Oral Hypoglycemic Agents  Renal/GU negative Renal ROS  negative genitourinary   Musculoskeletal  (+) Arthritis  (psoriatic arthritis on methotrexate),   Abdominal   Peds negative pediatric ROS (+)  Hematology negative hematology ROS (+)   Anesthesia Other Findings   Reproductive/Obstetrics negative OB ROS                           Anesthesia Physical Anesthesia Plan  ASA: 3  Anesthesia Plan: General   Post-op Pain Management:    Induction: Intravenous  PONV Risk Score and Plan: 2 and Treatment may vary due to age or medical condition  Airway Management Planned: Oral ETT and Video Laryngoscope Planned  Additional Equipment: None  Intra-op Plan:   Post-operative Plan: Extubation in OR  Informed Consent: I have reviewed the patients History and Physical, chart, labs and discussed the procedure including the risks, benefits and alternatives for the proposed anesthesia with the patient or authorized representative who has indicated his/her understanding and acceptance.     Dental advisory given  Plan Discussed with: CRNA and Anesthesiologist  Anesthesia Plan Comments: (GETA. Glidescope for  intubation. 2 PIV. Norton Blizzard, MD   )       Anesthesia Quick Evaluation

## 2021-05-07 ENCOUNTER — Other Ambulatory Visit: Payer: Self-pay

## 2021-05-07 ENCOUNTER — Encounter (HOSPITAL_COMMUNITY)
Admission: RE | Admit: 2021-05-07 | Discharge: 2021-05-07 | Disposition: A | Discharge status: Home / Self Care | Payer: Medicare HMO | Source: Ambulatory Visit | Attending: Interventional Cardiology | Admitting: Interventional Cardiology

## 2021-05-07 DIAGNOSIS — Z951 Presence of aortocoronary bypass graft: Secondary | ICD-10-CM | POA: Diagnosis not present

## 2021-05-07 DIAGNOSIS — I214 Non-ST elevation (NSTEMI) myocardial infarction: Secondary | ICD-10-CM

## 2021-05-07 NOTE — Progress Notes (Signed)
Reviewed home exercise Rx. Encouraged warm-up, cool down and stretching. Reviewed THRR of 58-116 and keeping RPE between 11-13. Encouraged hydration with activity. Discussed weather parameters for temperature and humidity. Reviewed S/S that would require patient to terminate activity and when to call MD vs 911. PT verbalized understanding of the home exercise Rx and was provided a copy.  Lesly Rubenstein MS, ACSM-CEP, CCRP

## 2021-05-10 ENCOUNTER — Encounter (HOSPITAL_COMMUNITY): Admission: RE | Admit: 2021-05-10 | Payer: Medicare HMO | Source: Ambulatory Visit

## 2021-05-10 ENCOUNTER — Telehealth (HOSPITAL_COMMUNITY): Payer: Self-pay | Admitting: *Deleted

## 2021-05-10 ENCOUNTER — Encounter (HOSPITAL_COMMUNITY): Payer: Medicare HMO

## 2021-05-10 DIAGNOSIS — M5412 Radiculopathy, cervical region: Secondary | ICD-10-CM | POA: Diagnosis not present

## 2021-05-10 NOTE — Telephone Encounter (Addendum)
Pt wife, mattis, featherly to inform Cardiac rehab staff that her husband will be out today - "something came up". Will inform CR staff. Cherre Huger, BSN Cardiac and Training and development officer

## 2021-05-12 ENCOUNTER — Encounter (HOSPITAL_COMMUNITY): Payer: Self-pay | Admitting: Orthopedic Surgery

## 2021-05-12 ENCOUNTER — Ambulatory Visit (HOSPITAL_COMMUNITY): Payer: Medicare HMO | Admitting: Vascular Surgery

## 2021-05-12 ENCOUNTER — Ambulatory Visit (HOSPITAL_COMMUNITY): Payer: Medicare HMO

## 2021-05-12 ENCOUNTER — Ambulatory Visit (HOSPITAL_COMMUNITY)
Admission: RE | Disposition: A | Discharge status: Home / Self Care | Payer: Self-pay | Source: Home / Self Care | Attending: Orthopedic Surgery

## 2021-05-12 ENCOUNTER — Encounter (HOSPITAL_COMMUNITY): Payer: Medicare HMO

## 2021-05-12 ENCOUNTER — Ambulatory Visit (HOSPITAL_COMMUNITY): Payer: Medicare HMO | Admitting: Anesthesiology

## 2021-05-12 ENCOUNTER — Ambulatory Visit (HOSPITAL_COMMUNITY)
Admission: RE | Admit: 2021-05-12 | Discharge: 2021-05-12 | Disposition: A | Discharge status: Home / Self Care | Payer: Medicare HMO | Attending: Orthopedic Surgery | Admitting: Orthopedic Surgery

## 2021-05-12 ENCOUNTER — Other Ambulatory Visit: Payer: Self-pay

## 2021-05-12 DIAGNOSIS — Z7984 Long term (current) use of oral hypoglycemic drugs: Secondary | ICD-10-CM | POA: Diagnosis not present

## 2021-05-12 DIAGNOSIS — M4802 Spinal stenosis, cervical region: Secondary | ICD-10-CM | POA: Insufficient documentation

## 2021-05-12 DIAGNOSIS — I10 Essential (primary) hypertension: Secondary | ICD-10-CM | POA: Diagnosis not present

## 2021-05-12 DIAGNOSIS — E785 Hyperlipidemia, unspecified: Secondary | ICD-10-CM | POA: Diagnosis not present

## 2021-05-12 DIAGNOSIS — Z951 Presence of aortocoronary bypass graft: Secondary | ICD-10-CM | POA: Insufficient documentation

## 2021-05-12 DIAGNOSIS — M4322 Fusion of spine, cervical region: Secondary | ICD-10-CM | POA: Diagnosis not present

## 2021-05-12 DIAGNOSIS — I2510 Atherosclerotic heart disease of native coronary artery without angina pectoris: Secondary | ICD-10-CM | POA: Insufficient documentation

## 2021-05-12 DIAGNOSIS — Z419 Encounter for procedure for purposes other than remedying health state, unspecified: Secondary | ICD-10-CM

## 2021-05-12 DIAGNOSIS — M5412 Radiculopathy, cervical region: Secondary | ICD-10-CM | POA: Insufficient documentation

## 2021-05-12 DIAGNOSIS — E119 Type 2 diabetes mellitus without complications: Secondary | ICD-10-CM | POA: Diagnosis not present

## 2021-05-12 DIAGNOSIS — I252 Old myocardial infarction: Secondary | ICD-10-CM | POA: Diagnosis not present

## 2021-05-12 HISTORY — PX: ANTERIOR CERVICAL DECOMP/DISCECTOMY FUSION: SHX1161

## 2021-05-12 LAB — GLUCOSE, CAPILLARY
Glucose-Capillary: 149 mg/dL — ABNORMAL HIGH (ref 70–99)
Glucose-Capillary: 139 mg/dL — ABNORMAL HIGH (ref 70–99)
Glucose-Capillary: 110 mg/dL — ABNORMAL HIGH (ref 70–99)

## 2021-05-12 SURGERY — ANTERIOR CERVICAL DECOMPRESSION/DISCECTOMY FUSION 1 LEVEL
Anesthesia: General | Site: Spine Cervical

## 2021-05-12 MED ORDER — POVIDONE-IODINE 7.5 % EX SOLN
Freq: Once | CUTANEOUS | Status: DC
  Filled 2021-05-12: qty 118

## 2021-05-12 MED ORDER — THROMBIN (RECOMBINANT) 20000 UNITS EX SOLR
CUTANEOUS | Status: AC
  Filled 2021-05-12: qty 20000

## 2021-05-12 MED ORDER — ONDANSETRON HCL 4 MG/2ML IJ SOLN
INTRAMUSCULAR | Status: DC | PRN
  Administered 2021-05-12: 4 mg via INTRAVENOUS

## 2021-05-12 MED ORDER — LIDOCAINE 2% (20 MG/ML) 5 ML SYRINGE
INTRAMUSCULAR | Status: AC
  Filled 2021-05-12: qty 5

## 2021-05-12 MED ORDER — ONDANSETRON HCL 4 MG/2ML IJ SOLN: 4.0000 mg | Freq: Once | INTRAMUSCULAR | Status: DC | PRN

## 2021-05-12 MED ORDER — FENTANYL CITRATE (PF) 250 MCG/5ML IJ SOLN
INTRAMUSCULAR | Status: AC
  Filled 2021-05-12: qty 5

## 2021-05-12 MED ORDER — LACTATED RINGERS IV SOLN: INTRAVENOUS | Status: DC

## 2021-05-12 MED ORDER — FENTANYL CITRATE (PF) 100 MCG/2ML IJ SOLN
25.0000 ug | INTRAMUSCULAR | Status: DC | PRN
  Administered 2021-05-12 (×2): 50 ug via INTRAVENOUS

## 2021-05-12 MED ORDER — ROCURONIUM BROMIDE 10 MG/ML (PF) SYRINGE
PREFILLED_SYRINGE | INTRAVENOUS | Status: AC
  Filled 2021-05-12: qty 10

## 2021-05-12 MED ORDER — ACETAMINOPHEN 500 MG PO TABS
1000.0000 mg | ORAL_TABLET | Freq: Once | ORAL | Status: AC
  Administered 2021-05-12: 1000 mg via ORAL
  Filled 2021-05-12: qty 2

## 2021-05-12 MED ORDER — CEFAZOLIN SODIUM-DEXTROSE 2-4 GM/100ML-% IV SOLN
2.0000 g | INTRAVENOUS | Status: AC
  Administered 2021-05-12: 2 g via INTRAVENOUS
  Filled 2021-05-12: qty 100

## 2021-05-12 MED ORDER — HEMOSTATIC AGENTS (NO CHARGE) OPTIME
TOPICAL | Status: DC | PRN
  Administered 2021-05-12: 1 via TOPICAL

## 2021-05-12 MED ORDER — OXYCODONE HCL 5 MG PO TABS: 5.0000 mg | ORAL_TABLET | Freq: Once | ORAL | Status: DC | PRN

## 2021-05-12 MED ORDER — PROPOFOL 10 MG/ML IV BOLUS
INTRAVENOUS | Status: AC
  Filled 2021-05-12: qty 20

## 2021-05-12 MED ORDER — LIDOCAINE 2% (20 MG/ML) 5 ML SYRINGE
INTRAMUSCULAR | Status: DC | PRN
  Administered 2021-05-12: 80 mg via INTRAVENOUS

## 2021-05-12 MED ORDER — ORAL CARE MOUTH RINSE: 15.0000 mL | Freq: Once | OROMUCOSAL | Status: AC

## 2021-05-12 MED ORDER — PROPOFOL 10 MG/ML IV BOLUS
INTRAVENOUS | Status: DC | PRN
  Administered 2021-05-12: 160 mg via INTRAVENOUS

## 2021-05-12 MED ORDER — BUPIVACAINE-EPINEPHRINE (PF) 0.25% -1:200000 IJ SOLN
INTRAMUSCULAR | Status: AC
  Filled 2021-05-12: qty 30

## 2021-05-12 MED ORDER — DEXAMETHASONE SODIUM PHOSPHATE 10 MG/ML IJ SOLN
INTRAMUSCULAR | Status: AC
  Filled 2021-05-12: qty 1

## 2021-05-12 MED ORDER — SUGAMMADEX SODIUM 200 MG/2ML IV SOLN
INTRAVENOUS | Status: DC | PRN
  Administered 2021-05-12: 165.2 mg via INTRAVENOUS

## 2021-05-12 MED ORDER — HYDROCODONE-ACETAMINOPHEN 5-325 MG PO TABS: 1.0000 | ORAL_TABLET | Freq: Four times a day (QID) | ORAL | 0 refills | Status: DC | PRN

## 2021-05-12 MED ORDER — METHOCARBAMOL 500 MG PO TABS: 500.0000 mg | ORAL_TABLET | Freq: Four times a day (QID) | ORAL | 0 refills | Status: DC | PRN

## 2021-05-12 MED ORDER — 0.9 % SODIUM CHLORIDE (POUR BTL) OPTIME
TOPICAL | Status: DC | PRN
  Administered 2021-05-12: 1000 mL

## 2021-05-12 MED ORDER — PHENYLEPHRINE HCL-NACL 20-0.9 MG/250ML-% IV SOLN
INTRAVENOUS | Status: DC | PRN
  Administered 2021-05-12: 10 ug/min via INTRAVENOUS

## 2021-05-12 MED ORDER — ONDANSETRON HCL 4 MG/2ML IJ SOLN
INTRAMUSCULAR | Status: AC
  Filled 2021-05-12: qty 2

## 2021-05-12 MED ORDER — THROMBIN 20000 UNITS EX SOLR
CUTANEOUS | Status: DC | PRN
  Administered 2021-05-12: 20000 [IU] via TOPICAL

## 2021-05-12 MED ORDER — FENTANYL CITRATE (PF) 250 MCG/5ML IJ SOLN
INTRAMUSCULAR | Status: DC | PRN
  Administered 2021-05-12: 50 ug via INTRAVENOUS
  Administered 2021-05-12: 100 ug via INTRAVENOUS

## 2021-05-12 MED ORDER — FENTANYL CITRATE (PF) 100 MCG/2ML IJ SOLN
INTRAMUSCULAR | Status: AC
  Filled 2021-05-12: qty 2

## 2021-05-12 MED ORDER — AMISULPRIDE (ANTIEMETIC) 5 MG/2ML IV SOLN: 10.0000 mg | Freq: Once | INTRAVENOUS | Status: DC | PRN

## 2021-05-12 MED ORDER — DEXAMETHASONE SODIUM PHOSPHATE 10 MG/ML IJ SOLN
INTRAMUSCULAR | Status: DC | PRN
  Administered 2021-05-12: 10 mg via INTRAVENOUS

## 2021-05-12 MED ORDER — ROCURONIUM BROMIDE 10 MG/ML (PF) SYRINGE
PREFILLED_SYRINGE | INTRAVENOUS | Status: DC | PRN
  Administered 2021-05-12: 80 mg via INTRAVENOUS
  Administered 2021-05-12: 20 mg via INTRAVENOUS

## 2021-05-12 MED ORDER — CHLORHEXIDINE GLUCONATE 0.12 % MT SOLN
15.0000 mL | Freq: Once | OROMUCOSAL | Status: AC
  Administered 2021-05-12: 15 mL via OROMUCOSAL
  Filled 2021-05-12: qty 15

## 2021-05-12 MED ORDER — OXYCODONE HCL 5 MG/5ML PO SOLN: 5.0000 mg | Freq: Once | ORAL | Status: DC | PRN

## 2021-05-12 SURGICAL SUPPLY — 65 items
BAG COUNTER SPONGE SURGICOUNT (BAG) ×2 IMPLANT
BENZOIN TINCTURE PRP APPL 2/3 (GAUZE/BANDAGES/DRESSINGS) ×2 IMPLANT
BIT DRILL NEURO 2X3.1 SFT TUCH (MISCELLANEOUS) ×1 IMPLANT
BIT DRILL SKYLINE 12MM (BIT) ×1 IMPLANT
BLADE SURG 15 STRL LF DISP TIS (BLADE) ×1 IMPLANT
BLADE SURG 15 STRL SS (BLADE) ×1
BONE VIVIGEN FORMABLE 1.3CC (Bone Implant) ×2 IMPLANT
CARTRIDGE OIL MAESTRO DRILL (MISCELLANEOUS) ×1 IMPLANT
CORD BIPOLAR FORCEPS 12FT (ELECTRODE) ×2 IMPLANT
COVER SURGICAL LIGHT HANDLE (MISCELLANEOUS) ×2 IMPLANT
DIFFUSER DRILL AIR PNEUMATIC (MISCELLANEOUS) ×2 IMPLANT
DRAPE C-ARM 42X72 X-RAY (DRAPES) ×2 IMPLANT
DRAPE POUCH INSTRU U-SHP 10X18 (DRAPES) ×2 IMPLANT
DRAPE SURG 17X23 STRL (DRAPES) ×6 IMPLANT
DRILL BIT SKYLINE 12MM (BIT) ×1
DRILL NEURO 2X3.1 SOFT TOUCH (MISCELLANEOUS) ×2
DURAPREP 26ML APPLICATOR (WOUND CARE) ×2 IMPLANT
ELECT COATED BLADE 2.86 ST (ELECTRODE) ×2 IMPLANT
ELECT REM PT RETURN 9FT ADLT (ELECTROSURGICAL) ×2
ELECTRODE REM PT RTRN 9FT ADLT (ELECTROSURGICAL) ×1 IMPLANT
GAUZE 4X4 16PLY ~~LOC~~+RFID DBL (SPONGE) ×2 IMPLANT
GAUZE SPONGE 4X4 12PLY STRL (GAUZE/BANDAGES/DRESSINGS) ×2 IMPLANT
GLOVE SRG 8 PF TXTR STRL LF DI (GLOVE) ×1 IMPLANT
GLOVE SURG ENC MOIS LTX SZ6.5 (GLOVE) ×2 IMPLANT
GLOVE SURG ENC MOIS LTX SZ8 (GLOVE) ×2 IMPLANT
GLOVE SURG UNDER POLY LF SZ7 (GLOVE) ×4 IMPLANT
GLOVE SURG UNDER POLY LF SZ8 (GLOVE) ×1
GOWN STRL REUS W/ TWL LRG LVL3 (GOWN DISPOSABLE) ×1 IMPLANT
GOWN STRL REUS W/ TWL XL LVL3 (GOWN DISPOSABLE) ×1 IMPLANT
GOWN STRL REUS W/TWL LRG LVL3 (GOWN DISPOSABLE) ×1
GOWN STRL REUS W/TWL XL LVL3 (GOWN DISPOSABLE) ×1
INTERLOCK LRDTC CRVCL VBR 6MM (Bone Implant) ×1 IMPLANT
IV CATH 14GX2 1/4 (CATHETERS) ×2 IMPLANT
KIT BASIN OR (CUSTOM PROCEDURE TRAY) ×2 IMPLANT
KIT TURNOVER KIT B (KITS) ×2 IMPLANT
LORDOTIC CERVICAL VBR 6MM SM (Bone Implant) ×2 IMPLANT
MANIFOLD NEPTUNE II (INSTRUMENTS) ×2 IMPLANT
NEEDLE PRECISIONGLIDE 27X1.5 (NEEDLE) ×2 IMPLANT
NEEDLE SPNL 20GX3.5 QUINCKE YW (NEEDLE) ×2 IMPLANT
NS IRRIG 1000ML POUR BTL (IV SOLUTION) ×2 IMPLANT
OIL CARTRIDGE MAESTRO DRILL (MISCELLANEOUS) ×2
PACK ORTHO CERVICAL (CUSTOM PROCEDURE TRAY) ×2 IMPLANT
PAD ARMBOARD 7.5X6 YLW CONV (MISCELLANEOUS) ×4 IMPLANT
PATTIES SURGICAL .5 X.5 (GAUZE/BANDAGES/DRESSINGS) IMPLANT
PATTIES SURGICAL .5 X1 (DISPOSABLE) IMPLANT
PIN DISTRACTION 14 (PIN) ×4 IMPLANT
PLATE SKYLINE 12MM (Plate) ×2 IMPLANT
POSITIONER HEAD DONUT 9IN (MISCELLANEOUS) ×2 IMPLANT
SCREW SKYLINE VARIABLE LG (Screw) ×8 IMPLANT
SPONGE INTESTINAL PEANUT (DISPOSABLE) ×2 IMPLANT
SPONGE SURGIFOAM ABS GEL 100 (HEMOSTASIS) IMPLANT
STRIP CLOSURE SKIN 1/2X4 (GAUZE/BANDAGES/DRESSINGS) ×2 IMPLANT
SURGIFLO W/THROMBIN 8M KIT (HEMOSTASIS) IMPLANT
SUT MNCRL AB 4-0 PS2 18 (SUTURE) ×2 IMPLANT
SUT SILK 4 0 (SUTURE)
SUT SILK 4-0 18XBRD TIE 12 (SUTURE) IMPLANT
SUT VIC AB 2-0 CT2 18 VCP726D (SUTURE) ×2 IMPLANT
SYR BULB IRRIG 60ML STRL (SYRINGE) ×2 IMPLANT
SYR CONTROL 10ML LL (SYRINGE) ×4 IMPLANT
TAPE CLOTH 4X10 WHT NS (GAUZE/BANDAGES/DRESSINGS) ×2 IMPLANT
TAPE UMBILICAL COTTON 1/8X30 (MISCELLANEOUS) ×2 IMPLANT
TOWEL GREEN STERILE (TOWEL DISPOSABLE) ×2 IMPLANT
TOWEL GREEN STERILE FF (TOWEL DISPOSABLE) ×2 IMPLANT
WATER STERILE IRR 1000ML POUR (IV SOLUTION) ×2 IMPLANT
YANKAUER SUCT BULB TIP NO VENT (SUCTIONS) ×2 IMPLANT

## 2021-05-12 NOTE — Anesthesia Procedure Notes (Addendum)
Procedure Name: Intubation Date/Time: 05/12/2021 2:17 PM Performed by: Maude Leriche, CRNA Pre-anesthesia Checklist: Patient identified, Emergency Drugs available, Suction available and Patient being monitored Patient Re-evaluated:Patient Re-evaluated prior to induction Oxygen Delivery Method: Circle system utilized Preoxygenation: Pre-oxygenation with 100% oxygen Induction Type: IV induction Ventilation: Mask ventilation without difficulty Laryngoscope Size: Glidescope and 3 Grade View: Grade I Tube type: Oral Tube size: 7.5 mm Number of attempts: 1 Airway Equipment and Method: Stylet Placement Confirmation: ETT inserted through vocal cords under direct vision, positive ETCO2 and breath sounds checked- equal and bilateral Secured at: 23 cm Tube secured with: Tape Dental Injury: Teeth and Oropharynx as per pre-operative assessment  Comments: Elective glidescope for ACDF

## 2021-05-12 NOTE — Transfer of Care (Signed)
Immediate Anesthesia Transfer of Care Note  Patient: Shane Ford [name]  Procedure(s) Performed: ANTERIOR CERVICAL DECOMPRESSION FUSION CERVICAL 5- CERVICAL 6 WITH INSTRUMENTATION AND ALLOGRAFT (Spine Cervical)  Patient Location: PACU  Anesthesia Type:General  Level of Consciousness: awake, alert  and oriented  Airway & Oxygen Therapy: Patient Spontanous Breathing and Patient connected to face mask oxygen  Post-op Assessment: Report given to RN, Post -op Vital signs reviewed and stable, Patient moving all extremities X 4 and Patient able to stick tongue midline  Post vital signs: stable  Last Vitals:  Vitals Value Taken Time  BP 185/97 05/12/21 1556  Temp 97.8   Pulse 56   Resp 11 05/12/21 1556  SpO2 100   Vitals shown include unvalidated device data.  Last Pain:  Vitals:   05/12/21 1017  TempSrc:   PainSc: 0-No pain         Complications: No notable events documented.

## 2021-05-12 NOTE — Op Note (Signed)
PATIENT NAME: Shane Ford [facility]   MEDICAL RECORD NO.:   [id number]    DATE OF BIRTH: 04-Jan-1946   DATE OF PROCEDURE: 05/12/2021                               OPERATIVE REPORT     PREOPERATIVE DIAGNOSES: 1. Left-sided cervical radiculopathy. 2. Spinal stenosis spanning C5/6   POSTOPERATIVE DIAGNOSES: 1. Left-sided cervical radiculopathy. 2. Spinal stenosis spanning C5/6   PROCEDURE: 1. Anterior cervical decompression and fusion C5/6 2. Placement of anterior instrumentation, C5/6 3. Insertion of interbody device x1 (38mm Titan intervertebral spacer). 4. Intraoperative use of fluoroscopy. 5. Use of morselized allograft - ViviGen.   SURGEON:  Phylliss Bob, MD   ASSISTANT:  Pricilla Holm, PA-C.   ANESTHESIA:  General endotracheal anesthesia.   COMPLICATIONS:  None.   DISPOSITION:  Stable.   ESTIMATED BLOOD LOSS:  Minimal.   INDICATIONS FOR SURGERY:  Briefly, Mr. Donaghey is a pleasant 75 -year- old male, who did present to me with severe pain in his neck and left arm.   The patient's MRI did reveal the findings noted above.  Given the ongoing rather debilitating pain and lack of improvement with appropriate treatment measures, we did discuss proceeding with the procedure noted above.  The patient was fully aware of the risks and limitations of surgery as outlined in my preoperative note.   OPERATIVE DETAILS:  On 05/12/2021, the patient was brought to surgery and general endotracheal anesthesia was administered.  The patient was placed supine on the hospital bed. The neck was gently extended.  All bony prominences were meticulously padded.  The neck was prepped and draped in the usual sterile fashion.  At this point, I did make a left-sided transverse incision.  The platysma was incised.  A Smith-Robinson approach was used and the anterior spine was identified. A self-retaining retractor was placed.  I then subperiosteally exposed the vertebral bodies from C5-C6.  Caspar  pins were then placed into the C5 and C6 vertebral bodies and distraction was applied.  A thorough and complete C5/6 intervertebral diskectomy was performed.  The posterior longitudinal ligament was identified and entered using a nerve hook.  I then used #1 followed by #2 Kerrison to perform a thorough and complete intervertebral diskectomy.  The spinal canal was thoroughly decompressed, as was the left neuroforamen.  The endplates were then prepared and the appropriate-sized intervertebral spacer was then packed with ViviGen and tamped into position in the usual fashion. The Caspar pins  then were removed and bone wax was placed in their place.  The appropriate-sized anterior cervical plate was placed over the anterior spine.  12 mm variable angle screws were placed, 2 in each vertebral body from C5-C6 for a total of 4 vertebral body screws.  The screws were then locked to the plate using the Cam locking mechanism.  I was very pleased with the final fluoroscopic images.  The wound was then irrigated.  The wound was then explored for any undue bleeding and there was no bleeding noted. The wound was then closed in layers using 2-0 Vicryl, followed by 4-0 Monocryl.  Benzoin and Steri-Strips were applied, followed by sterile dressing.  All instrument counts were correct at the termination of the procedure.   Of note, Pricilla Holm, PA-C, was my assistant throughout surgery, and did aid in retraction, placement of the hardware, suctioning, and closure from start to finish.  Phylliss Bob, MD

## 2021-05-12 NOTE — H&P (Signed)
PREOPERATIVE H&P  Chief Complaint: Left arm pain  HPI: Shane Ford is a 75 y.o. male who presents with ongoing pain in the left arm  MRI reveals a C5/6 disc herniation, compressing the left C6 nerve  Patient has failed multiple forms of conservative care and continues to have pain (see office notes for additional details regarding the patient's full course of treatment)  Past Medical History:  Diagnosis Date   Arthritis    pt states he was diagnosed 10+ years ago; psoriatic arthritis   Coronary artery disease    Depression    pt diagnosed 10+ years ago   Diabetes mellitus ([place])    Dysrhythmia 01/17/2021   first degree AV block, 2nd degree AVB, Mobitz I in setting of MI   Hyperlipidemia    Hypertension    Past Surgical History:  Procedure Laterality Date   CARDIAC CATHETERIZATION     CORONARY ARTERY BYPASS GRAFT N/A 01/19/2021   Procedure: CORONARY ARTERY BYPASS GRAFTING (CABG) TIMES FOUR, ON PUMP, USING LEFT INTERNAL MAMMARY ARTERY AND ENDOSCOPICALLY HARVESTED GREATER SAPHENOUS VEIN;  Surgeon: Lajuana Matte, MD;  Location: [place];  Service: Open Heart Surgery;  Laterality: N/A;  flow trac   ENDOVEIN HARVEST OF GREATER SAPHENOUS VEIN Right 01/19/2021   Procedure: ENDOVEIN HARVEST OF GREATER SAPHENOUS VEIN;  Surgeon: Lajuana Matte, MD;  Location: [place];  Service: Open Heart Surgery;  Laterality: Right;   EYE SURGERY Bilateral 2021   cataracts replaced in both eyes   LEFT HEART CATH AND CORONARY ANGIOGRAPHY N/A 01/17/2021   Procedure: LEFT HEART CATH AND CORONARY ANGIOGRAPHY;  Surgeon: Belva Crome, MD;  Location: [place] [facility];  Service: Cardiovascular;  Laterality: N/A;   TEE WITHOUT CARDIOVERSION N/A 01/19/2021   Procedure: TRANSESOPHAGEAL ECHOCARDIOGRAM (TEE);  Surgeon: Lajuana Matte, MD;  Location: [place];  Service: Open Heart Surgery;  Laterality: N/A;   Social History   Socioeconomic History   Marital status: Married    Spouse  name: Not on file   Number of children: 2   Years of education: 14   Highest education level: Not on file  Occupational History   Occupation: Retired  Tobacco Use   Smoking status: Never   Smokeless tobacco: Never  Vaping Use   Vaping Use: Never used  Substance and Sexual Activity   Alcohol use: Never   Drug use: Never   Sexual activity: Not on file  Other Topics Concern   Not on file  Social History Narrative   Not on file   Social Determinants of Health   Financial Resource Strain: Not on file  Food Insecurity: Not on file  Transportation Needs: Not on file  Physical Activity: Not on file  Stress: Not on file  Social Connections: Not on file   No family history on file. No Known Allergies Prior to Admission medications   Medication Sig Start Date End Date Taking? Authorizing Provider  acetaminophen (TYLENOL) 500 MG tablet Take 1,000 mg by mouth every 6 (six) hours as needed for moderate pain.   Yes [provider]  aspirin EC 81 MG tablet Take 81 mg by mouth daily. Swallow whole.   Yes [provider]  Cholecalciferol 50 MCG (2000 UT) CAPS Take 2,000 Units by mouth daily.   Yes [provider]  docusate sodium (COLACE) 100 MG capsule Take 200 mg by mouth 2 (two) times daily.   Yes [provider]  donepezil (ARICEPT) 5 MG tablet Take  5 mg by mouth at bedtime.   Yes [provider]  empagliflozin (JARDIANCE) 10 MG TABS tablet Take 1 tablet (10 mg total) by mouth daily before breakfast. 01/23/21  Yes Barrett, Erin R, PA-C  fenofibrate 160 MG tablet Take 160 mg by mouth daily.   Yes [provider]  folic acid (FOLVITE) 881 MCG tablet Take 800 mcg by mouth daily.   Yes [provider]  gabapentin (NEURONTIN) 300 MG capsule Take 300 mg by mouth 2 (two) times daily. 12/25/20  Yes [provider]  glimepiride (AMARYL) 1 MG tablet Take 2 mg by mouth 2 (two) times daily with a meal.   Yes [provider]   hydrochlorothiazide (MICROZIDE) 12.5 MG capsule Take 1 capsule (12.5 mg total) by mouth daily. 02/03/21  Yes Belva Crome, MD  metFORMIN (GLUCOPHAGE) 500 MG tablet Take 500 mg by mouth 2 (two) times daily with a meal.   Yes [provider]  methotrexate (RHEUMATREX) 2.5 MG tablet Take 12.5 mg by mouth every Wednesday. Take 5 tablets (12.5mg ) once a week on Wednesdays. Caution:Chemotherapy. Protect from light.   Yes [provider]  polyvinyl alcohol (LIQUIFILM TEARS) 1.4 % ophthalmic solution Place 1 drop into both eyes as needed for dry eyes.   Yes [provider]  simvastatin (ZOCOR) 40 MG tablet Take 40 mg by mouth daily.   Yes [provider]  venlafaxine XR (EFFEXOR-XR) 37.5 MG 24 hr capsule Take 37.5 mg by mouth daily with breakfast.   Yes [provider]  vitamin B-12 (CYANOCOBALAMIN) 1000 MCG tablet Take 1,000 mcg by mouth daily.   Yes [provider]  lisinopril (ZESTRIL) 5 MG tablet Take 1 tablet (5 mg total) by mouth daily. Patient not taking: No sig reported 02/02/21 05/03/21  Belva Crome, MD     All other systems have been reviewed and were otherwise negative with the exception of those mentioned in the HPI and as above.  Physical Exam: There were no vitals filed for this visit.  There is no height or weight on file to calculate BMI.  General: Alert, no acute distress Cardiovascular: No pedal edema Respiratory: No cyanosis, no use of accessory musculature Skin: No lesions in the area of chief complaint Neurologic: Sensation intact distally Psychiatric: Patient is competent for consent with normal mood and affect Lymphatic: No axillary or cervical lymphadenopathy  Assessment/Plan: LEFT CERVICAL RADICULOPATHY Plan for Procedure(s): ANTERIOR CERVICAL DECOMPRESSION FUSION CERVICAL 5- CERVICAL 6 WITH INSTRUMENTATION AND ALLOGRAFT   Norva Karvonen, MD 05/12/2021 6:59 AM

## 2021-05-13 ENCOUNTER — Telehealth (HOSPITAL_COMMUNITY): Payer: Self-pay | Admitting: *Deleted

## 2021-05-13 MED FILL — Thrombin (Recombinant) For Soln 20000 Unit: CUTANEOUS | Qty: 1 | Status: AC

## 2021-05-13 NOTE — Telephone Encounter (Signed)
Spoke with Mr Oguinn he is s/p cervical decompression/ fusion and was told by his surgeon not to participate in cardiac rehab for 6 weeks. Will discharge form cardiac rehab at this time. Mr Vahle may decide to return to complete his sessions at a later date and time. Yechezkel attended 9 exercise sessions between 04/19/21-05/07/21. [place]comb did well with exercise while in attendance. [place] vital signs and CBG's were stable.Barnet Pall, RN,BSN 05/13/2021 3:31 PM

## 2021-05-14 ENCOUNTER — Encounter (HOSPITAL_COMMUNITY): Payer: Medicare HMO

## 2021-05-14 ENCOUNTER — Encounter (HOSPITAL_COMMUNITY): Payer: Self-pay | Admitting: Orthopedic Surgery

## 2021-05-14 NOTE — Anesthesia Postprocedure Evaluation (Signed)
Anesthesia Post Note  Patient: Jaquise Faux Schrager  Procedure(s) Performed: ANTERIOR CERVICAL DECOMPRESSION FUSION CERVICAL 5- CERVICAL 6 WITH INSTRUMENTATION AND ALLOGRAFT (Spine Cervical)     Patient location during evaluation: PACU Anesthesia Type: General Level of consciousness: awake Pain management: pain level controlled Vital Signs Assessment: post-procedure vital signs reviewed and stable Respiratory status: spontaneous breathing and respiratory function stable Cardiovascular status: stable Postop Assessment: no apparent nausea or vomiting Anesthetic complications: no   No notable events documented.  Last Vitals:  Vitals:   05/12/21 1640 05/12/21 1645  BP:  133/60  Pulse:  (!) 54  Resp:  17  Temp:  36.5 C  SpO2: 98% 98%    Last Pain:  Vitals:   05/12/21 1645  TempSrc:   PainSc: 4    Pain Goal: Patients Stated Pain Goal: 4 (05/12/21 1645)                 Merlinda Frederick

## 2021-05-17 ENCOUNTER — Encounter (HOSPITAL_COMMUNITY): Payer: Medicare HMO

## 2021-05-17 ENCOUNTER — Encounter (HOSPITAL_COMMUNITY): Payer: Self-pay | Admitting: Orthopedic Surgery

## 2021-05-19 ENCOUNTER — Encounter (HOSPITAL_COMMUNITY): Payer: Medicare HMO

## 2021-05-21 ENCOUNTER — Encounter (HOSPITAL_COMMUNITY): Payer: Medicare HMO

## 2021-05-24 ENCOUNTER — Encounter (HOSPITAL_COMMUNITY): Payer: Medicare HMO

## 2021-05-26 ENCOUNTER — Encounter (HOSPITAL_COMMUNITY): Payer: Medicare HMO

## 2021-05-26 DIAGNOSIS — M5412 Radiculopathy, cervical region: Secondary | ICD-10-CM | POA: Diagnosis not present

## 2021-05-28 ENCOUNTER — Encounter (HOSPITAL_COMMUNITY): Payer: Medicare HMO

## 2021-05-31 ENCOUNTER — Encounter (HOSPITAL_COMMUNITY): Payer: Medicare HMO

## 2021-05-31 DIAGNOSIS — E113393 Type 2 diabetes mellitus with moderate nonproliferative diabetic retinopathy without macular edema, bilateral: Secondary | ICD-10-CM | POA: Diagnosis not present

## 2021-05-31 DIAGNOSIS — H524 Presbyopia: Secondary | ICD-10-CM | POA: Diagnosis not present

## 2021-06-02 ENCOUNTER — Encounter (HOSPITAL_COMMUNITY): Payer: Medicare HMO

## 2021-06-04 ENCOUNTER — Encounter (HOSPITAL_COMMUNITY): Payer: Medicare HMO

## 2021-06-07 ENCOUNTER — Encounter (HOSPITAL_COMMUNITY): Payer: Medicare HMO

## 2021-06-09 ENCOUNTER — Encounter (HOSPITAL_COMMUNITY): Payer: Medicare HMO

## 2021-06-11 ENCOUNTER — Encounter (HOSPITAL_COMMUNITY): Payer: Medicare HMO

## 2021-06-11 ENCOUNTER — Ambulatory Visit (HOSPITAL_COMMUNITY): Payer: Medicare HMO

## 2021-06-15 DIAGNOSIS — Z9889 Other specified postprocedural states: Secondary | ICD-10-CM | POA: Diagnosis not present

## 2021-06-15 DIAGNOSIS — M5412 Radiculopathy, cervical region: Secondary | ICD-10-CM | POA: Diagnosis not present

## 2021-06-28 DIAGNOSIS — C44329 Squamous cell carcinoma of skin of other parts of face: Secondary | ICD-10-CM | POA: Diagnosis not present

## 2021-06-28 DIAGNOSIS — Z85828 Personal history of other malignant neoplasm of skin: Secondary | ICD-10-CM | POA: Diagnosis not present

## 2021-06-28 DIAGNOSIS — X32XXXA Exposure to sunlight, initial encounter: Secondary | ICD-10-CM | POA: Diagnosis not present

## 2021-06-28 DIAGNOSIS — L570 Actinic keratosis: Secondary | ICD-10-CM | POA: Diagnosis not present

## 2021-06-28 DIAGNOSIS — D485 Neoplasm of uncertain behavior of skin: Secondary | ICD-10-CM | POA: Diagnosis not present

## 2021-06-28 DIAGNOSIS — D2261 Melanocytic nevi of right upper limb, including shoulder: Secondary | ICD-10-CM | POA: Diagnosis not present

## 2021-06-28 DIAGNOSIS — D2262 Melanocytic nevi of left upper limb, including shoulder: Secondary | ICD-10-CM | POA: Diagnosis not present

## 2021-06-28 DIAGNOSIS — D2272 Melanocytic nevi of left lower limb, including hip: Secondary | ICD-10-CM | POA: Diagnosis not present

## 2021-06-28 NOTE — Progress Notes (Signed)
Cardiology Office Note:    Date:  06/30/2021   ID:  Shane Ford, DOB 1946/02/04, MRN [id number]  PCP:  Shane Aus, MD  Cardiologist:  Shane Grooms, MD   Referring MD: Shane Aus, MD   Chief Complaint  Patient presents with   Coronary Artery Disease   Hypertension    History of Present Illness:    Shane Ford is a 76 y.o. male with a hx of  hypertension, early dementia, primary hypertension, hyperlipidemia, diabetes mellitus type 2, and ST elevation MI related to occlusion of LV branch of RCA that was too far distal and too small to treat interventionally.  Successful four-vessel bypass with LIMA to LAD, SVG to PL, SVG to OM, SVG to diagonal 1 ([name])- 01/19/2021.  He feels well.  He tells me his prior cardiologist said he had second-degree heart block Mobitz 1 or some other arrhythmia.  He had Mobitz 1 AV block post MI.  He has progressed well after surgery without syncope, angina, heart failure symptoms, or medication side effects.  Past Medical History:  Diagnosis Date   Arthritis    pt states he was diagnosed 10+ years ago; psoriatic arthritis   Coronary artery disease    Depression    pt diagnosed 10+ years ago   Diabetes mellitus ([place])    Dysrhythmia 01/17/2021   first degree AV block, 2nd degree AVB, Mobitz I in setting of MI   Hyperlipidemia    Hypertension     Past Surgical History:  Procedure Laterality Date   ANTERIOR CERVICAL DECOMP/DISCECTOMY FUSION N/A 05/12/2021   Procedure: ANTERIOR CERVICAL DECOMPRESSION FUSION CERVICAL 5- CERVICAL 6 WITH INSTRUMENTATION AND ALLOGRAFT;  Surgeon: Phylliss Bob, MD;  Location: [place];  Service: Orthopedics;  Laterality: N/A;   CARDIAC CATHETERIZATION     CORONARY ARTERY BYPASS GRAFT N/A 01/19/2021   Procedure: CORONARY ARTERY BYPASS GRAFTING (CABG) TIMES FOUR, ON PUMP, USING LEFT INTERNAL MAMMARY ARTERY AND ENDOSCOPICALLY HARVESTED GREATER SAPHENOUS VEIN;  Surgeon: Lajuana Matte, MD;   Location: [place];  Service: Open Heart Surgery;  Laterality: N/A;  flow trac   ENDOVEIN HARVEST OF GREATER SAPHENOUS VEIN Right 01/19/2021   Procedure: ENDOVEIN HARVEST OF GREATER SAPHENOUS VEIN;  Surgeon: Lajuana Matte, MD;  Location: [place];  Service: Open Heart Surgery;  Laterality: Right;   EYE SURGERY Bilateral 2021   cataracts replaced in both eyes   LEFT HEART CATH AND CORONARY ANGIOGRAPHY N/A 01/17/2021   Procedure: LEFT HEART CATH AND CORONARY ANGIOGRAPHY;  Surgeon: Belva Crome, MD;  Location: [place] [facility];  Service: Cardiovascular;  Laterality: N/A;   TEE WITHOUT CARDIOVERSION N/A 01/19/2021   Procedure: TRANSESOPHAGEAL ECHOCARDIOGRAM (TEE);  Surgeon: Lajuana Matte, MD;  Location: [place];  Service: Open Heart Surgery;  Laterality: N/A;    Current Medications: Current Meds  Medication Sig   acetaminophen (TYLENOL) 500 MG tablet Take 1,000 mg by mouth every 6 (six) hours as needed for moderate pain.   aspirin EC 81 MG tablet Take 81 mg by mouth daily. Swallow whole.   Cholecalciferol 50 MCG (2000 UT) CAPS Take 2,000 Units by mouth daily.   docusate sodium (COLACE) 100 MG capsule Take 200 mg by mouth 2 (two) times daily.   donepezil (ARICEPT) 5 MG tablet Take 5 mg by mouth at bedtime.   empagliflozin (JARDIANCE) 10 MG TABS tablet Take 1 tablet (10 mg total) by mouth daily before breakfast.   fenofibrate 160 MG tablet Take 160  mg by mouth daily.   folic acid (FOLVITE) 025 MCG tablet Take 800 mcg by mouth daily.   glimepiride (AMARYL) 1 MG tablet Take 2 mg by mouth 2 (two) times daily with a meal.   hydrochlorothiazide (MICROZIDE) 12.5 MG capsule Take 1 capsule (12.5 mg total) by mouth daily.   metFORMIN (GLUCOPHAGE) 500 MG tablet Take 500 mg by mouth 2 (two) times daily with a meal.   methotrexate (RHEUMATREX) 2.5 MG tablet Take 12.5 mg by mouth every Wednesday. Take 6 tablets (12.5mg ) once a week on Wednesdays. Caution:Chemotherapy. Protect from light.    polyvinyl alcohol (LIQUIFILM TEARS) 1.4 % ophthalmic solution Place 1 drop into both eyes as needed for dry eyes.   simvastatin (ZOCOR) 40 MG tablet Take 40 mg by mouth daily.   venlafaxine XR (EFFEXOR-XR) 37.5 MG 24 hr capsule Take 37.5 mg by mouth daily with breakfast.   vitamin B-12 (CYANOCOBALAMIN) 1000 MCG tablet Take 1,000 mcg by mouth daily.     Allergies:   Patient has no known allergies.   Social History   Socioeconomic History   Marital status: Married    Spouse name: Not on file   Number of children: 2   Years of education: 14   Highest education level: Not on file  Occupational History   Occupation: Retired  Tobacco Use   Smoking status: Never   Smokeless tobacco: Never  Vaping Use   Vaping Use: Never used  Substance and Sexual Activity   Alcohol use: Never   Drug use: Never   Sexual activity: Not on file  Other Topics Concern   Not on file  Social History Narrative   Not on file   Social Determinants of Health   Financial Resource Strain: Not on file  Food Insecurity: Not on file  Transportation Needs: Not on file  Physical Activity: Not on file  Stress: Not on file  Social Connections: Not on file     Family History: The patient's family history is not on file.  ROS:   Please see the history of present illness.    Has undergone cervical spine fusion since I last saw him.  No difficulty this is best as I can tell.  Regaining strength in his left hand.  All other systems reviewed and are negative.  EKGs/Labs/Other Studies Reviewed:    The following studies were reviewed today: No new imaging  EKG:  EKG Last EKG was performed February 02, 2021 and demonstrated sinus tachycardia, normal PR, left axis deviation/left anterior hemiblock, and old inferior infarct.  EKG today demonstrates Mobitz 1 second-degree heart block with heart rates as low as 44 bpm.  Recent Labs: 01/20/2021: Magnesium 2.2 02/02/2021: NT-Pro BNP 888 05/04/2021: ALT 12; BUN 17;  Creatinine, Ser 1.23; Hemoglobin 14.4; Platelets 254; Potassium 4.7; Sodium 138  Recent Lipid Panel    Component Value Date/Time   CHOL 156 01/17/2021 2325   TRIG 198 (H) 01/17/2021 2325   HDL 45 01/17/2021 2325   CHOLHDL 3.5 01/17/2021 2325   VLDL 40 01/17/2021 2325   LDLCALC 71 01/17/2021 2325    Physical Exam:    VS:  BP 130/80    Pulse (!) 42    Ht 6\' 1"  (1.854 m)    Wt 186 lb 9.6 oz (84.6 kg)    SpO2 99%    BMI 24.62 kg/m     Wt Readings from Last 3 Encounters:  06/30/21 186 lb 9.6 oz (84.6 kg)  05/12/21 182 lb (82.6 kg)  05/04/21  187 lb 14.4 oz (85.2 kg)     GEN: Appears well. No acute distress HEENT: Normal NECK: No JVD. LYMPHATICS: No lymphadenopathy CARDIAC: No murmur murmur.  Irregular RR no gallop, or edema. VASCULAR:  Normal Pulses. No bruits. RESPIRATORY:  Clear to auscultation without rales, wheezing or rhonchi  ABDOMEN: Soft, non-tender, non-distended, No pulsatile mass, MUSCULOSKELETAL: No deformity  SKIN: Warm and dry NEUROLOGIC:  Alert and oriented x 3 PSYCHIATRIC:  Normal affect   ASSESSMENT:    1. S/P CABG x 4   2. Primary hypertension   3. Type 2 diabetes mellitus with complication, without long-term current use of insulin ([place])   4. Hyperlipidemia LDL goal <70   5. Second degree heart block    PLAN:    In order of problems listed above:  No angina or symptoms to suggest ischemia.  Secondary prevention is being instituted. Current blood pressures are excellent.  Continue Microzide. Continue Jardiance. Continue fenofibrate and consider statin therapy. 7-day monitor to exclude higher degree AV block.  He is totally asymptomatic.  Overall education and awareness concerning primary/secondary risk prevention was discussed in detail: LDL less than 70, hemoglobin A1c less than 7, blood pressure target less than 130/80 mmHg, >150 minutes of moderate aerobic activity per week, avoidance of smoking, weight control (via diet and exercise), and continued  surveillance/management of/for obstructive sleep apnea.    Medication Adjustments/Labs and Tests Ordered: Current medicines are reviewed at length with the patient today.  Concerns regarding medicines are outlined above.  No orders of the defined types were placed in this encounter.  No orders of the defined types were placed in this encounter.   Patient Instructions  Medication Instructions:  Your physician recommends that you continue on your current medications as directed. Please refer to the Current Medication list given to you today.  *If you need a refill on your cardiac medications before your next appointment, please call your pharmacy*   Lab Work: None If you have labs (blood work) drawn today and your tests are completely normal, you will receive your results only by: [place] (if you have MyChart) OR A paper copy in the mail If you have any lab test that is abnormal or we need to change your treatment, we will call you to review the results.   Testing/Procedures: Your physician recommends that you wear a monitor for 1 week.   Follow-Up: At [facility], you and your health needs are our priority.  As part of our continuing mission to provide you with exceptional heart care, we have created designated Provider Care Teams.  These Care Teams include your primary Cardiologist (physician) and Advanced Practice Providers (APPs -  Physician Assistants and Nurse Practitioners) who all work together to provide you with the care you need, when you need it.  We recommend signing up for the patient portal called "MyChart".  Sign up information is provided on this After Visit Summary.  MyChart is used to connect with patients for Virtual Visits (Telemedicine).  Patients are able to view lab/test results, encounter notes, upcoming appointments, etc.  Non-urgent messages can be sent to your provider as well.   To learn more about what you can do with MyChart, go to  [email or web address].    Your next appointment:   6 month(s)  The format for your next appointment:   In Person  Provider:   Sinclair Grooms, MD     Other Instructions  [facility]- Long Term Monitor Instructions  Your  physician has requested you wear a ZIO patch monitor for 7 days.  This is a single patch monitor. [employer] supplies one patch monitor per enrollment. Additional stickers are not available. Please do not apply patch if you will be having a Nuclear Stress Test,  Echocardiogram, Cardiac CT, MRI, or Chest Xray during the period you would be wearing the  monitor. The patch cannot be worn during these tests. You cannot remove and re-apply the  ZIO XT patch monitor.  Your ZIO patch monitor will be mailed 3 day USPS to your address on file. It may take 3-5 days  to receive your monitor after you have been enrolled.  Once you have received your monitor, please review the enclosed instructions. Your monitor  has already been registered assigning a specific monitor serial # to you.  Billing and Patient Assistance Program Information  We have supplied [employer] with any of your insurance information on file for billing purposes. [employer] offers a sliding scale Patient Assistance Program for patients that do not have  insurance, or whose insurance does not completely cover the cost of the ZIO monitor.  You must apply for the Patient Assistance Program to qualify for this discounted rate.  To apply, please call [employer] at [phone], select option 4, select option 2, ask to apply for  Patient Assistance Program. Theodore Demark will ask your household income, and how many people  are in your household. They will quote your out-of-pocket cost based on that information.  [employer] will also be able to set up a 51-month, interest-free payment plan if needed.  Applying the monitor   Shave hair from upper left chest.  Hold abrader disc by orange tab. Rub abrader in 40 strokes over the upper  left chest as  indicated in your monitor instructions.  Clean area with 4 enclosed alcohol pads. Let dry.  Apply patch as indicated in monitor instructions. Patch will be placed under collarbone on left  side of chest with arrow pointing upward.  Rub patch adhesive wings for 2 minutes. Remove white label marked "1". Remove the white  label marked "2". Rub patch adhesive wings for 2 additional minutes.  While looking in a mirror, press and release button in center of patch. A small green light will  flash 3-4 times. This will be your only indicator that the monitor has been turned on.  Do not shower for the first 24 hours. You may shower after the first 24 hours.  Press the button if you feel a symptom. You will hear a small click. Record Date, Time and  Symptom in the Patient Logbook.  When you are ready to remove the patch, follow instructions on the last 2 pages of Patient  Logbook. Stick patch monitor onto the last page of Patient Logbook.  Place Patient Logbook in the blue and white box. Use locking tab on box and tape box closed  securely. The blue and white box has prepaid postage on it. Please place it in the mailbox as  soon as possible. Your physician should have your test results approximately 7 days after the  monitor has been mailed back to [facility].  Call [facility] at [phone] if you have questions regarding  your ZIO XT patch monitor. Call them immediately if you see an orange light blinking on your  monitor.  If your monitor falls off in less than 4 days, contact our Monitor department at [phone].  If your monitor becomes loose or falls off after 4  days call [employer] at [phone] for  suggestions on securing your monitor     Signed, Shane Grooms, MD  06/30/2021 3:22 PM    [place]

## 2021-06-30 ENCOUNTER — Encounter: Payer: Self-pay | Admitting: Interventional Cardiology

## 2021-06-30 ENCOUNTER — Ambulatory Visit: Payer: Medicare HMO | Admitting: Interventional Cardiology

## 2021-06-30 ENCOUNTER — Other Ambulatory Visit: Payer: Self-pay

## 2021-06-30 ENCOUNTER — Ambulatory Visit (INDEPENDENT_AMBULATORY_CARE_PROVIDER_SITE_OTHER): Payer: Medicare HMO

## 2021-06-30 VITALS — BP 130/80 | HR 42 | Ht 73.0 in | Wt 186.6 lb

## 2021-06-30 DIAGNOSIS — E118 Type 2 diabetes mellitus with unspecified complications: Secondary | ICD-10-CM | POA: Diagnosis not present

## 2021-06-30 DIAGNOSIS — I10 Essential (primary) hypertension: Secondary | ICD-10-CM | POA: Diagnosis not present

## 2021-06-30 DIAGNOSIS — I441 Atrioventricular block, second degree: Secondary | ICD-10-CM

## 2021-06-30 DIAGNOSIS — E785 Hyperlipidemia, unspecified: Secondary | ICD-10-CM | POA: Diagnosis not present

## 2021-06-30 DIAGNOSIS — Z951 Presence of aortocoronary bypass graft: Secondary | ICD-10-CM

## 2021-06-30 NOTE — Progress Notes (Unsigned)
Applied a 7day Zio XT monitor to patients in the office

## 2021-06-30 NOTE — Patient Instructions (Signed)
Medication Instructions:  Your physician recommends that you continue on your current medications as directed. Please refer to the Current Medication list given to you today.  *If you need a refill on your cardiac medications before your next appointment, please call your pharmacy*   Lab Work: None If you have labs (blood work) drawn today and your tests are completely normal, you will receive your results only by: [place] (if you have MyChart) OR A paper copy in the mail If you have any lab test that is abnormal or we need to change your treatment, we will call you to review the results.   Testing/Procedures: Your physician recommends that you wear a monitor for 1 week.   Follow-Up: At [facility], you and your health needs are our priority.  As part of our continuing mission to provide you with exceptional heart care, we have created designated Provider Care Teams.  These Care Teams include your primary Cardiologist (physician) and Advanced Practice Providers (APPs -  Physician Assistants and Nurse Practitioners) who all work together to provide you with the care you need, when you need it.  We recommend signing up for the patient portal called "MyChart".  Sign up information is provided on this After Visit Summary.  MyChart is used to connect with patients for Virtual Visits (Telemedicine).  Patients are able to view lab/test results, encounter notes, upcoming appointments, etc.  Non-urgent messages can be sent to your provider as well.   To learn more about what you can do with MyChart, go to [email or web address].    Your next appointment:   6 month(s)  The format for your next appointment:   In Person  Provider:   Sinclair Grooms, MD     Other Instructions  [place] Monitor Instructions  Your physician has requested you wear a ZIO patch monitor for 7 days.  This is a single patch monitor. [employer] supplies one patch monitor per enrollment.  Additional stickers are not available. Please do not apply patch if you will be having a Nuclear Stress Test,  Echocardiogram, Cardiac CT, MRI, or Chest Xray during the period you would be wearing the  monitor. The patch cannot be worn during these tests. You cannot remove and re-apply the  ZIO XT patch monitor.  Your ZIO patch monitor will be mailed 3 day USPS to your address on file. It may take 3-5 days  to receive your monitor after you have been enrolled.  Once you have received your monitor, please review the enclosed instructions. Your monitor  has already been registered assigning a specific monitor serial # to you.  Billing and Patient Assistance Program Information  We have supplied [employer] with any of your insurance information on file for billing purposes. [employer] offers a sliding scale Patient Assistance Program for patients that do not have  insurance, or whose insurance does not completely cover the cost of the ZIO monitor.  You must apply for the Patient Assistance Program to qualify for this discounted rate.  To apply, please call [employer] at [phone], select option 4, select option 2, ask to apply for  Patient Assistance Program. Theodore Demark will ask your household income, and how many people  are in your household. They will quote your out-of-pocket cost based on that information.  [employer] will also be able to set up a 2-month, interest-free payment plan if needed.  Applying the monitor   Shave hair from upper left chest.  Hold abrader disc by orange  tab. Rub abrader in 40 strokes over the upper left chest as  indicated in your monitor instructions.  Clean area with 4 enclosed alcohol pads. Let dry.  Apply patch as indicated in monitor instructions. Patch will be placed under collarbone on left  side of chest with arrow pointing upward.  Rub patch adhesive wings for 2 minutes. Remove white label marked "1". Remove the white  label marked "2". Rub patch adhesive wings  for 2 additional minutes.  While looking in a mirror, press and release button in center of patch. A small green light will  flash 3-4 times. This will be your only indicator that the monitor has been turned on.  Do not shower for the first 24 hours. You may shower after the first 24 hours.  Press the button if you feel a symptom. You will hear a small click. Record Date, Time and  Symptom in the Patient Logbook.  When you are ready to remove the patch, follow instructions on the last 2 pages of Patient  Logbook. Stick patch monitor onto the last page of Patient Logbook.  Place Patient Logbook in the blue and white box. Use locking tab on box and tape box closed  securely. The blue and white box has prepaid postage on it. Please place it in the mailbox as  soon as possible. Your physician should have your test results approximately 7 days after the  monitor has been mailed back to [facility].  Call [place] at [phone] if you have questions regarding  your ZIO XT patch monitor. Call them immediately if you see an orange light blinking on your  monitor.  If your monitor falls off in less than 4 days, contact our Monitor department at [phone].  If your monitor becomes loose or falls off after 4 days call [employer] at [phone] for  suggestions on securing your monitor

## 2021-07-08 DIAGNOSIS — I441 Atrioventricular block, second degree: Secondary | ICD-10-CM

## 2021-07-16 ENCOUNTER — Telehealth: Payer: Self-pay | Admitting: Interventional Cardiology

## 2021-07-16 DIAGNOSIS — I442 Atrioventricular block, complete: Secondary | ICD-10-CM

## 2021-07-16 DIAGNOSIS — I441 Atrioventricular block, second degree: Secondary | ICD-10-CM | POA: Diagnosis not present

## 2021-07-16 NOTE — Telephone Encounter (Signed)
° °  Shane Ford with [employer] calling to give critical zio result

## 2021-07-16 NOTE — Telephone Encounter (Signed)
Shane Ford called to report Pt wore 7 day heart monitor.  Noted with 17 episodes of complete HB minimum HR 36 bpm sustained for 7.8 secs.  Noted on pg 13-14 strip 6.  1/9 at 11:23 pm. Will route to MD to address.

## 2021-07-16 NOTE — Telephone Encounter (Signed)
Called pt reviewed MD recommendation for EP referral.  Pt reports feeling lightheaded/ dizzy at random times in the evening.  Pt says this occurs mainly 6PM and after.  Advised pt that a scheduler will contact to set up appointment.

## 2021-07-20 ENCOUNTER — Ambulatory Visit: Payer: Medicare HMO | Admitting: Internal Medicine

## 2021-07-20 ENCOUNTER — Encounter: Payer: Self-pay | Admitting: Internal Medicine

## 2021-07-20 ENCOUNTER — Other Ambulatory Visit: Payer: Self-pay

## 2021-07-20 DIAGNOSIS — I4430 Unspecified atrioventricular block: Secondary | ICD-10-CM | POA: Insufficient documentation

## 2021-07-20 DIAGNOSIS — I459 Conduction disorder, unspecified: Secondary | ICD-10-CM

## 2021-07-20 NOTE — Progress Notes (Signed)
HPI [name] is referred today by Dr. Tamala Julian for evaluation of symptomatic PVCs and high-grade heart block.  The patient has not had syncope.  He does have dizzy spells and palpitations.  He has a history of coronary artery disease with preserved left ventricular function status post bypass surgery.  He denies anginal symptoms.  The patient has recently worn a cardiac monitor which demonstrates frequent PVCs as well as high-grade heart block with 2-1 heart block as well as transient complete heart block as well as AV [employer] block.  The patient denies shortness of breath or peripheral edema.  He has very mild dementia and is on Aricept. No Known Allergies   Current Outpatient Medications  Medication Sig Dispense Refill   acetaminophen (TYLENOL) 500 MG tablet Take 1,000 mg by mouth every 6 (six) hours as needed for moderate pain.     aspirin EC 81 MG tablet Take 81 mg by mouth daily. Swallow whole.     Cholecalciferol 50 MCG (2000 UT) CAPS Take 2,000 Units by mouth daily.     docusate sodium (COLACE) 100 MG capsule Take 200 mg by mouth 2 (two) times daily.     donepezil (ARICEPT) 5 MG tablet Take 5 mg by mouth at bedtime.     empagliflozin (JARDIANCE) 10 MG TABS tablet Take 1 tablet (10 mg total) by mouth daily before breakfast. 30 tablet 3   fenofibrate 160 MG tablet Take 160 mg by mouth daily.     folic acid (FOLVITE) 350 MCG tablet Take 800 mcg by mouth daily.     gabapentin (NEURONTIN) 300 MG capsule Take 300 mg by mouth 2 (two) times daily. (Patient not taking: Reported on 06/30/2021)     glimepiride (AMARYL) 1 MG tablet Take 2 mg by mouth 2 (two) times daily with a meal.     hydrochlorothiazide (MICROZIDE) 12.5 MG capsule Take 1 capsule (12.5 mg total) by mouth daily. 90 capsule 3   HYDROcodone-acetaminophen (NORCO/VICODIN) 5-325 MG tablet Take 1 tablet by mouth every 6 (six) hours as needed for moderate pain or severe pain. (Patient not taking: Reported on 06/30/2021) 20 tablet 0    metFORMIN (GLUCOPHAGE) 500 MG tablet Take 500 mg by mouth 2 (two) times daily with a meal.     methocarbamol (ROBAXIN) 500 MG tablet Take 1 tablet (500 mg total) by mouth every 6 (six) hours as needed for muscle spasms. (Patient not taking: Reported on 06/30/2021) 60 tablet 0   methotrexate (RHEUMATREX) 2.5 MG tablet Take 12.5 mg by mouth every Wednesday. Take 6 tablets (12.5mg ) once a week on Wednesdays. Caution:Chemotherapy. Protect from light.     polyvinyl alcohol (LIQUIFILM TEARS) 1.4 % ophthalmic solution Place 1 drop into both eyes as needed for dry eyes.     simvastatin (ZOCOR) 40 MG tablet Take 40 mg by mouth daily.     venlafaxine XR (EFFEXOR-XR) 37.5 MG 24 hr capsule Take 37.5 mg by mouth daily with breakfast.     vitamin B-12 (CYANOCOBALAMIN) 1000 MCG tablet Take 1,000 mcg by mouth daily.     No current facility-administered medications for this visit.     Past Medical History:  Diagnosis Date   Arthritis    pt states he was diagnosed 10+ years ago; psoriatic arthritis   Coronary artery disease    Depression    pt diagnosed 10+ years ago   Diabetes mellitus ([place])    Dysrhythmia 01/17/2021   first degree AV block, 2nd degree AVB, [place]  I in setting of MI   Hyperlipidemia    Hypertension     ROS:   All systems reviewed and negative except as noted in the HPI.   Past Surgical History:  Procedure Laterality Date   ANTERIOR CERVICAL DECOMP/DISCECTOMY FUSION N/A 05/12/2021   Procedure: ANTERIOR CERVICAL DECOMPRESSION FUSION CERVICAL 5- CERVICAL 6 WITH INSTRUMENTATION AND ALLOGRAFT;  Surgeon: Phylliss Bob, MD;  Location: [facility];  Service: Orthopedics;  Laterality: N/A;   CARDIAC CATHETERIZATION     CORONARY ARTERY BYPASS GRAFT N/A 01/19/2021   Procedure: CORONARY ARTERY BYPASS GRAFTING (CABG) TIMES FOUR, ON PUMP, USING LEFT INTERNAL MAMMARY ARTERY AND ENDOSCOPICALLY HARVESTED GREATER SAPHENOUS VEIN;  Surgeon: Lajuana Matte, MD;  Location: [facility];  Service: Open Heart  Surgery;  Laterality: N/A;  flow trac   ENDOVEIN HARVEST OF GREATER SAPHENOUS VEIN Right 01/19/2021   Procedure: ENDOVEIN HARVEST OF GREATER SAPHENOUS VEIN;  Surgeon: Lajuana Matte, MD;  Location: [facility];  Service: Open Heart Surgery;  Laterality: Right;   EYE SURGERY Bilateral 2021   cataracts replaced in both eyes   LEFT HEART CATH AND CORONARY ANGIOGRAPHY N/A 01/17/2021   Procedure: LEFT HEART CATH AND CORONARY ANGIOGRAPHY;  Surgeon: Belva Crome, MD;  Location: [facility];  Service: Cardiovascular;  Laterality: N/A;   TEE WITHOUT CARDIOVERSION N/A 01/19/2021   Procedure: TRANSESOPHAGEAL ECHOCARDIOGRAM (TEE);  Surgeon: Lajuana Matte, MD;  Location: [facility];  Service: Open Heart Surgery;  Laterality: N/A;     History reviewed. No pertinent family history.   Social History   Socioeconomic History   Marital status: Married    Spouse name: Not on file   Number of children: 2   Years of education: 14   Highest education level: Not on file  Occupational History   Occupation: Retired  Tobacco Use   Smoking status: Never   Smokeless tobacco: Never  Vaping Use   Vaping Use: Never used  Substance and Sexual Activity   Alcohol use: Never   Drug use: Never   Sexual activity: Not on file  Other Topics Concern   Not on file  Social History Narrative   Not on file   Social Determinants of Health   Financial Resource Strain: Not on file  Food Insecurity: Not on file  Transportation Needs: Not on file  Physical Activity: Not on file  Stress: Not on file  Social Connections: Not on file  Intimate Partner Violence: Not on file     BP 126/76    Pulse 66    Ht 6\' 2"  (1.88 m)    Wt 191 lb 6.4 oz (86.8 kg)    SpO2 93%    BMI 24.57 kg/m   Physical Exam:  Well appearing NAD HEENT: Unremarkable Neck:  No JVD, no thyromegally Lymphatics:  No adenopathy Back:  No CVA tenderness Lungs:  Clear with no wheezes, rales, or rhonchi HEART:  Regular rate rhythm, no  murmurs, no rubs, no clicks Abd:  soft, positive bowel sounds, no organomegally, no rebound, no guarding Ext:  2 plus pulses, no edema, no cyanosis, no clubbing Skin:  No rashes no nodules Neuro:  CN II through XII intact, motor grossly intact  EKG -reviewed.  Normal sinus rhythm with mostly 2-1 AV block  Assess/Plan:  1.  Symptomatic 2-1 AV block -I discussed the treatment options with the patient and his wife in detail.  He is not severely symptomatic but needs treatment of his symptomatic PVCs.  He has evidence  of significant conduction disease.  The risk, goals, benefits, and expectations of permanent pacemaker insertion were reviewed and he wishes to proceed. 2.  Symptomatic PVCs -I would anticipate the addition of a beta-blocker following successful implantation of his dual-chamber pacemaker. 3.  Coronary artery disease -he is status post bypass surgery and currently has no symptoms.  He will undergo watchful waiting.  Cristopher Peru, MD

## 2021-07-20 NOTE — Patient Instructions (Addendum)
Medication Instructions:  Your physician recommends that you continue on your current medications as directed. Please refer to the Current Medication list given to you today.  Labwork: You will get lab work today:  CBC and BMP  Testing/Procedures: None ordered.  Follow-Up:  SEE INSTRUCTION LETTER  Any Other Special Instructions Will Be Listed Below (If Applicable).  If you need a refill on your cardiac medications before your next appointment, please call your pharmacy.   Pacemaker Implantation, Adult Pacemaker implantation is a procedure to place a pacemaker inside the chest. A pacemaker is a small computer that sends electrical signals to the heart and helps the heart beat normally. A pacemaker also stores information about heart rhythms. You may need pacemaker implantation if you have: A slow heartbeat (bradycardia). Loss of consciousness that happens repeatedly (syncope) or repeated episodes of dizziness or light-headedness because of an irregular heart rate. Shortness of breath (dyspnea) due to heart problems. The pacemaker usually attaches to your heart through a wire called a lead. One or two leads may be needed. There are different types of pacemakers: Transvenous pacemaker. This type is placed under the skin or muscle of your upper chest area. The lead goes through a vein in the chest area to reach the inside of the heart. Epicardial pacemaker. This type is placed under the skin or muscle of your chest or abdomen. The lead goes through your chest to the outside of the heart. Tell a health care provider about: Any allergies you have. All medicines you are taking, including vitamins, herbs, eye drops, creams, and over-the-counter medicines. Any problems you or family members have had with anesthetic medicines. Any blood or bone disorders you have. Any surgeries you have had. Any medical conditions you have. Whether you are pregnant or may be pregnant. What are the  risks? Generally, this is a safe procedure. However, problems may occur, including: Infection. Bleeding. Failure of the pacemaker or the lead. Collapse of a lung or bleeding into a lung. Blood clot inside a blood vessel with a lead. Damage to the heart. Infection inside the heart (endocarditis). Allergic reactions to medicines. What happens before the procedure? Staying hydrated Follow instructions from your health care provider about hydration, which may include: Up to 2 hours before the procedure - you may continue to drink clear liquids, such as water, clear fruit juice, black coffee, and plain tea.  Eating and drinking restrictions Follow instructions from your health care provider about eating and drinking, which may include: 8 hours before the procedure - stop eating heavy meals or foods, such as meat, fried foods, or fatty foods. 6 hours before the procedure - stop eating light meals or foods, such as toast or cereal. 6 hours before the procedure - stop drinking milk or drinks that contain milk. 2 hours before the procedure - stop drinking clear liquids. Medicines Ask your health care provider about: Changing or stopping your regular medicines. This is especially important if you are taking diabetes medicines or blood thinners. Taking medicines such as aspirin and ibuprofen. These medicines can thin your blood. Do not take these medicines unless your health care provider tells you to take them. Taking over-the-counter medicines, vitamins, herbs, and supplements. Tests You may have: A heart evaluation. This may include: An electrocardiogram (ECG). This involves placing patches on your skin to check your heart rhythm. A chest X-ray. An echocardiogram. This is a test that uses sound waves (ultrasound) to produce an image of the heart. A cardiac rhythm monitor.  This is used to record your heart rhythm and any events for a longer period of time. Blood tests. Genetic  testing. General instructions Do not use any products that contain nicotine or tobacco for at least 4 weeks before the procedure. These products include cigarettes, e-cigarettes, and chewing tobacco. If you need help quitting, ask your health care provider. Ask your health care provider: How your surgery site will be marked. What steps will be taken to help prevent infection. These steps may include: Removing hair at the surgery site. Washing skin with a germ-killing soap. Receiving antibiotic medicine. Plan to have someone take you home from the hospital or clinic. If you will be going home right after the procedure, plan to have someone with you for 24 hours. What happens during the procedure? An IV will be inserted into one of your veins. You will be given one or more of the following: A medicine to help you relax (sedative). A medicine to numb the area (local anesthetic). A medicine to make you fall asleep (general anesthetic). The next steps vary depending on the type of pacemaker you will be getting. If you are getting a transvenous pacemaker: An incision will be made in your upper chest. A pocket will be made for the pacemaker. It may be placed under the skin or between layers of muscle. The lead will be inserted into a blood vessel that goes to the heart. While X-rays are taken by an imaging machine (fluoroscopy), the lead will be advanced through the vein to the inside of your heart. The other end of the lead will be tunneled under the skin and attached to the pacemaker. If you are getting an epicardial pacemaker: An incision will be made near your ribs or breastbone (sternum) for the lead. The lead will be attached to the outside of your heart. Another incision will be made in your chest or upper abdomen to create a pocket for the pacemaker. The free end of the lead will be tunneled under the skin and attached to the pacemaker. The transvenous or epicardial pacemaker will be  tested. Imaging studies may be done to check the lead position. The incisions will be closed with stitches (sutures), adhesive strips, or skin glue. Bandages (dressings) will be placed over the incisions. The procedure may vary among health care providers and hospitals. What happens after the procedure? Your blood pressure, heart rate, breathing rate, and blood oxygen level will be monitored until you leave the hospital or clinic. You may be given antibiotics. You will be given pain medicine. An ECG and chest X-rays will be done. You may need to wear a continuous type of ECG (Holter monitor) to check your heart rhythm. Your health care provider will program the pacemaker. If you were given a sedative during the procedure, it can affect you for several hours. Do not drive or operate machinery until your health care provider says that it is safe. You will be given a pacemaker identification card. This card lists the implant date, device model, and manufacturer of your pacemaker. Summary A pacemaker is a small computer that sends electrical signals to the heart and helps the heart beat normally. There are different types of pacemakers. A pacemaker may be placed under the skin or muscle of your chest or abdomen. Follow instructions from your health care provider about eating and drinking and about taking medicines before the procedure. This information is not intended to replace advice given to you by your health care provider. Make  sure you discuss any questions you have with your health care provider. Document Revised: 02/23/2021 Document Reviewed: 05/15/2019 Elsevier Patient Education  2022 [employer].

## 2021-07-20 NOTE — H&P (View-Only) (Signed)
HPI Shane Ford is referred today by Dr. Tamala Julian for evaluation of symptomatic PVCs and high-grade heart block.  The patient has not had syncope.  He does have dizzy spells and palpitations.  He has a history of coronary artery disease with preserved left ventricular function status post bypass surgery.  He denies anginal symptoms.  The patient has recently worn a cardiac monitor which demonstrates frequent PVCs as well as high-grade heart block with 2-1 heart block as well as transient complete heart block as well as AV [employer] block.  The patient denies shortness of breath or peripheral edema.  He has very mild dementia and is on Aricept. No Known Allergies   Current Outpatient Medications  Medication Sig Dispense Refill   acetaminophen (TYLENOL) 500 MG tablet Take 1,000 mg by mouth every 6 (six) hours as needed for moderate pain.     aspirin EC 81 MG tablet Take 81 mg by mouth daily. Swallow whole.     Cholecalciferol 50 MCG (2000 UT) CAPS Take 2,000 Units by mouth daily.     docusate sodium (COLACE) 100 MG capsule Take 200 mg by mouth 2 (two) times daily.     donepezil (ARICEPT) 5 MG tablet Take 5 mg by mouth at bedtime.     empagliflozin (JARDIANCE) 10 MG TABS tablet Take 1 tablet (10 mg total) by mouth daily before breakfast. 30 tablet 3   fenofibrate 160 MG tablet Take 160 mg by mouth daily.     folic acid (FOLVITE) 703 MCG tablet Take 800 mcg by mouth daily.     gabapentin (NEURONTIN) 300 MG capsule Take 300 mg by mouth 2 (two) times daily. (Patient not taking: Reported on 06/30/2021)     glimepiride (AMARYL) 1 MG tablet Take 2 mg by mouth 2 (two) times daily with a meal.     hydrochlorothiazide (MICROZIDE) 12.5 MG capsule Take 1 capsule (12.5 mg total) by mouth daily. 90 capsule 3   HYDROcodone-acetaminophen (NORCO/VICODIN) 5-325 MG tablet Take 1 tablet by mouth every 6 (six) hours as needed for moderate pain or severe pain. (Patient not taking: Reported on 06/30/2021) 20 tablet 0    metFORMIN (GLUCOPHAGE) 500 MG tablet Take 500 mg by mouth 2 (two) times daily with a meal.     methocarbamol (ROBAXIN) 500 MG tablet Take 1 tablet (500 mg total) by mouth every 6 (six) hours as needed for muscle spasms. (Patient not taking: Reported on 06/30/2021) 60 tablet 0   methotrexate (RHEUMATREX) 2.5 MG tablet Take 12.5 mg by mouth every Wednesday. Take 6 tablets (12.5mg ) once a week on Wednesdays. Caution:Chemotherapy. Protect from light.     polyvinyl alcohol (LIQUIFILM TEARS) 1.4 % ophthalmic solution Place 1 drop into both eyes as needed for dry eyes.     simvastatin (ZOCOR) 40 MG tablet Take 40 mg by mouth daily.     venlafaxine XR (EFFEXOR-XR) 37.5 MG 24 hr capsule Take 37.5 mg by mouth daily with breakfast.     vitamin B-12 (CYANOCOBALAMIN) 1000 MCG tablet Take 1,000 mcg by mouth daily.     No current facility-administered medications for this visit.     Past Medical History:  Diagnosis Date   Arthritis    pt states he was diagnosed 10+ years ago; psoriatic arthritis   Coronary artery disease    Depression    pt diagnosed 10+ years ago   Diabetes mellitus ([place])    Dysrhythmia 01/17/2021   first degree AV block, 2nd degree AVB, Mobitz  I in setting of MI   Hyperlipidemia    Hypertension     ROS:   All systems reviewed and negative except as noted in the HPI.   Past Surgical History:  Procedure Laterality Date   ANTERIOR CERVICAL DECOMP/DISCECTOMY FUSION N/A 05/12/2021   Procedure: ANTERIOR CERVICAL DECOMPRESSION FUSION CERVICAL 5- CERVICAL 6 WITH INSTRUMENTATION AND ALLOGRAFT;  Surgeon: Phylliss Bob, MD;  Location: [place];  Service: Orthopedics;  Laterality: N/A;   CARDIAC CATHETERIZATION     CORONARY ARTERY BYPASS GRAFT N/A 01/19/2021   Procedure: CORONARY ARTERY BYPASS GRAFTING (CABG) TIMES FOUR, ON PUMP, USING LEFT INTERNAL MAMMARY ARTERY AND ENDOSCOPICALLY HARVESTED GREATER SAPHENOUS VEIN;  Surgeon: Lajuana Matte, MD;  Location: [place];  Service: Open Heart  Surgery;  Laterality: N/A;  flow trac   ENDOVEIN HARVEST OF GREATER SAPHENOUS VEIN Right 01/19/2021   Procedure: ENDOVEIN HARVEST OF GREATER SAPHENOUS VEIN;  Surgeon: Lajuana Matte, MD;  Location: [place];  Service: Open Heart Surgery;  Laterality: Right;   EYE SURGERY Bilateral 2021   cataracts replaced in both eyes   LEFT HEART CATH AND CORONARY ANGIOGRAPHY N/A 01/17/2021   Procedure: LEFT HEART CATH AND CORONARY ANGIOGRAPHY;  Surgeon: Belva Crome, MD;  Location: [facility];  Service: Cardiovascular;  Laterality: N/A;   TEE WITHOUT CARDIOVERSION N/A 01/19/2021   Procedure: TRANSESOPHAGEAL ECHOCARDIOGRAM (TEE);  Surgeon: Lajuana Matte, MD;  Location: [place];  Service: Open Heart Surgery;  Laterality: N/A;     History reviewed. No pertinent family history.   Social History   Socioeconomic History   Marital status: Married    Spouse name: Not on file   Number of children: 2   Years of education: 14   Highest education level: Not on file  Occupational History   Occupation: Retired  Tobacco Use   Smoking status: Never   Smokeless tobacco: Never  Vaping Use   Vaping Use: Never used  Substance and Sexual Activity   Alcohol use: Never   Drug use: Never   Sexual activity: Not on file  Other Topics Concern   Not on file  Social History Narrative   Not on file   Social Determinants of Health   Financial Resource Strain: Not on file  Food Insecurity: Not on file  Transportation Needs: Not on file  Physical Activity: Not on file  Stress: Not on file  Social Connections: Not on file  Intimate Partner Violence: Not on file     BP 126/76    Pulse 66    Ht 6\' 2"  (1.88 m)    Wt 191 lb 6.4 oz (86.8 kg)    SpO2 93%    BMI 24.57 kg/m   Physical Exam:  Well appearing NAD HEENT: Unremarkable Neck:  No JVD, no thyromegally Lymphatics:  No adenopathy Back:  No CVA tenderness Lungs:  Clear with no wheezes, rales, or rhonchi HEART:  Regular rate rhythm, no  murmurs, no rubs, no clicks Abd:  soft, positive bowel sounds, no organomegally, no rebound, no guarding Ext:  2 plus pulses, no edema, no cyanosis, no clubbing Skin:  No rashes no nodules Neuro:  CN II through XII intact, motor grossly intact  EKG -reviewed.  Normal sinus rhythm with mostly 2-1 AV block  Assess/Plan:  1.  Symptomatic 2-1 AV block -I discussed the treatment options with the patient and his wife in detail.  He is not severely symptomatic but needs treatment of his symptomatic PVCs.  He has evidence  of significant conduction disease.  The risk, goals, benefits, and expectations of permanent pacemaker insertion were reviewed and he wishes to proceed. 2.  Symptomatic PVCs -I would anticipate the addition of a beta-blocker following successful implantation of his dual-chamber pacemaker. 3.  Coronary artery disease -he is status post bypass surgery and currently has no symptoms.  He will undergo watchful waiting.  Cristopher Peru, MD

## 2021-07-21 LAB — CBC WITH DIFFERENTIAL/PLATELET
Basophils Absolute: 0 10*3/uL (ref 0.0–0.2)
Basos: 0 %
EOS (ABSOLUTE): 0.4 10*3/uL (ref 0.0–0.4)
Eos: 6 %
Hematocrit: 44.7 % (ref 37.5–51.0)
Hemoglobin: 15 g/dL (ref 13.0–17.7)
Immature Grans (Abs): 0 10*3/uL (ref 0.0–0.1)
Immature Granulocytes: 0 %
Lymphocytes Absolute: 2.1 10*3/uL (ref 0.7–3.1)
Lymphs: 33 %
MCH: 28.4 pg (ref 26.6–33.0)
MCHC: 33.6 g/dL (ref 31.5–35.7)
MCV: 85 fL (ref 79–97)
Monocytes Absolute: 0.7 10*3/uL (ref 0.1–0.9)
Monocytes: 11 %
Neutrophils Absolute: 3.1 10*3/uL (ref 1.4–7.0)
Neutrophils: 50 %
Platelets: 255 10*3/uL (ref 150–450)
RBC: 5.28 x10E6/uL (ref 4.14–5.80)
RDW: 17.7 % — ABNORMAL HIGH (ref 11.6–15.4)
WBC: 6.3 10*3/uL (ref 3.4–10.8)

## 2021-07-21 LAB — BASIC METABOLIC PANEL
BUN/Creatinine Ratio: 16 (ref 10–24)
BUN: 19 mg/dL (ref 8–27)
CO2: 27 mmol/L (ref 20–29)
Calcium: 9.7 mg/dL (ref 8.6–10.2)
Chloride: 106 mmol/L (ref 96–106)
Creatinine, Ser: 1.2 mg/dL (ref 0.76–1.27)
Glucose: 148 mg/dL — ABNORMAL HIGH (ref 70–99)
Potassium: 4.8 mmol/L (ref 3.5–5.2)
Sodium: 145 mmol/L — ABNORMAL HIGH (ref 134–144)
eGFR: 63 mL/min/{1.73_m2} (ref >59)

## 2021-07-28 ENCOUNTER — Other Ambulatory Visit: Payer: Self-pay

## 2021-07-28 ENCOUNTER — Ambulatory Visit (HOSPITAL_COMMUNITY)
Admission: RE | Admit: 2021-07-28 | Discharge: 2021-07-28 | Disposition: A | Discharge status: Home / Self Care | Payer: Medicare HMO | Source: Ambulatory Visit | Attending: Internal Medicine | Admitting: Internal Medicine

## 2021-07-28 ENCOUNTER — Ambulatory Visit (HOSPITAL_COMMUNITY): Payer: Medicare HMO

## 2021-07-28 ENCOUNTER — Encounter (HOSPITAL_COMMUNITY)
Admission: RE | Disposition: A | Discharge status: Home / Self Care | Payer: Self-pay | Source: Ambulatory Visit | Attending: Internal Medicine

## 2021-07-28 DIAGNOSIS — Z951 Presence of aortocoronary bypass graft: Secondary | ICD-10-CM | POA: Insufficient documentation

## 2021-07-28 DIAGNOSIS — E119 Type 2 diabetes mellitus without complications: Secondary | ICD-10-CM | POA: Diagnosis not present

## 2021-07-28 DIAGNOSIS — I441 Atrioventricular block, second degree: Secondary | ICD-10-CM | POA: Diagnosis not present

## 2021-07-28 DIAGNOSIS — I442 Atrioventricular block, complete: Secondary | ICD-10-CM | POA: Diagnosis not present

## 2021-07-28 DIAGNOSIS — I2510 Atherosclerotic heart disease of native coronary artery without angina pectoris: Secondary | ICD-10-CM | POA: Diagnosis not present

## 2021-07-28 DIAGNOSIS — Z950 Presence of cardiac pacemaker: Secondary | ICD-10-CM

## 2021-07-28 DIAGNOSIS — F03A3 Unspecified dementia, mild, with mood disturbance: Secondary | ICD-10-CM | POA: Insufficient documentation

## 2021-07-28 DIAGNOSIS — Z7984 Long term (current) use of oral hypoglycemic drugs: Secondary | ICD-10-CM | POA: Diagnosis not present

## 2021-07-28 HISTORY — PX: PACEMAKER IMPLANT: EP1218

## 2021-07-28 LAB — GLUCOSE, CAPILLARY: Glucose-Capillary: 100 mg/dL — ABNORMAL HIGH (ref 70–99)

## 2021-07-28 SURGERY — PACEMAKER IMPLANT

## 2021-07-28 MED ORDER — SODIUM CHLORIDE 0.9 % IV SOLN: INTRAVENOUS | Status: DC

## 2021-07-28 MED ORDER — FENTANYL CITRATE (PF) 100 MCG/2ML IJ SOLN
INTRAMUSCULAR | Status: DC | PRN
  Administered 2021-07-28: 12.5 ug via INTRAVENOUS

## 2021-07-28 MED ORDER — FENTANYL CITRATE (PF) 100 MCG/2ML IJ SOLN
INTRAMUSCULAR | Status: AC
  Filled 2021-07-28: qty 2

## 2021-07-28 MED ORDER — POVIDONE-IODINE 10 % EX SWAB
2.0000 "application " | Freq: Once | CUTANEOUS | Status: AC
  Administered 2021-07-28: 2 via TOPICAL

## 2021-07-28 MED ORDER — LIDOCAINE HCL (PF) 1 % IJ SOLN
INTRAMUSCULAR | Status: DC | PRN
  Administered 2021-07-28: 60 mL

## 2021-07-28 MED ORDER — CEFAZOLIN SODIUM-DEXTROSE 1-4 GM/50ML-% IV SOLN
1.0000 g | Freq: Once | INTRAVENOUS | Status: AC
  Filled 2021-07-28: qty 50

## 2021-07-28 MED ORDER — CEFAZOLIN SODIUM-DEXTROSE 2-4 GM/100ML-% IV SOLN
2.0000 g | INTRAVENOUS | Status: AC
  Administered 2021-07-28: 2 g via INTRAVENOUS
  Filled 2021-07-28: qty 100

## 2021-07-28 MED ORDER — SODIUM CHLORIDE 0.9 % IV SOLN
80.0000 mg | INTRAVENOUS | Status: AC
  Administered 2021-07-28: 80 mg

## 2021-07-28 MED ORDER — MIDAZOLAM HCL 5 MG/5ML IJ SOLN
INTRAMUSCULAR | Status: DC | PRN
  Administered 2021-07-28: 1 mg via INTRAVENOUS

## 2021-07-28 MED ORDER — HEPARIN (PORCINE) IN NACL 1000-0.9 UT/500ML-% IV SOLN
INTRAVENOUS | Status: AC
  Filled 2021-07-28: qty 500

## 2021-07-28 MED ORDER — CHLORHEXIDINE GLUCONATE 4 % EX LIQD
4.0000 "application " | Freq: Once | CUTANEOUS | Status: DC
  Filled 2021-07-28: qty 60

## 2021-07-28 MED ORDER — HEPARIN (PORCINE) IN NACL 2-0.9 UNITS/ML
INTRAMUSCULAR | Status: DC | PRN
  Administered 2021-07-28: 500 mL

## 2021-07-28 MED ORDER — ONDANSETRON HCL 4 MG/2ML IJ SOLN: 4.0000 mg | Freq: Four times a day (QID) | INTRAMUSCULAR | Status: DC | PRN

## 2021-07-28 MED ORDER — MIDAZOLAM HCL 5 MG/5ML IJ SOLN
INTRAMUSCULAR | Status: AC
  Filled 2021-07-28: qty 5

## 2021-07-28 MED ORDER — ACETAMINOPHEN 325 MG PO TABS
325.0000 mg | ORAL_TABLET | ORAL | Status: DC | PRN
  Administered 2021-07-28: 650 mg via ORAL
  Filled 2021-07-28: qty 2

## 2021-07-28 MED ORDER — LIDOCAINE HCL (PF) 1 % IJ SOLN
INTRAMUSCULAR | Status: AC
  Filled 2021-07-28: qty 60

## 2021-07-28 MED ORDER — CEFAZOLIN SODIUM-DEXTROSE 1-4 GM/50ML-% IV SOLN
INTRAVENOUS | Status: AC
  Administered 2021-07-28: 1 g via INTRAVENOUS
  Filled 2021-07-28: qty 50

## 2021-07-28 MED ORDER — CEFAZOLIN SODIUM-DEXTROSE 2-4 GM/100ML-% IV SOLN
INTRAVENOUS | Status: AC
  Filled 2021-07-28: qty 100

## 2021-07-28 MED ORDER — SODIUM CHLORIDE 0.9 % IV SOLN
INTRAVENOUS | Status: AC
  Filled 2021-07-28: qty 2

## 2021-07-28 SURGICAL SUPPLY — 12 items
CABLE SURGICAL S-101-97-12 (CABLE) ×2 IMPLANT
CATH RIGHTSITE C315HIS02 (CATHETERS) ×1 IMPLANT
GUIDEWIRE ANGLED .035X150CM (WIRE) ×1 IMPLANT
IPG PACE AZUR XT DR MRI W1DR01 (Pacemaker) IMPLANT
LEAD CAPSURE NOVUS 5076-52CM (Lead) ×1 IMPLANT
LEAD SELECT SECURE 3830 383069 (Lead) IMPLANT
PACE AZURE XT DR MRI W1DR01 (Pacemaker) ×2 IMPLANT
PAD DEFIB RADIO PHYSIO CONN (PAD) ×2 IMPLANT
SELECT SECURE 3830 383069 (Lead) ×2 IMPLANT
SHEATH 7FR PRELUDE SNAP 13 (SHEATH) ×2 IMPLANT
SLITTER 6232ADJ (MISCELLANEOUS) ×1 IMPLANT
TRAY PACEMAKER INSERTION (PACKS) ×2 IMPLANT

## 2021-07-28 NOTE — Discharge Instructions (Signed)
After Your Pacemaker   You have a [employer] Pacemaker  ACTIVITY Do not lift your arm above shoulder height for 1 week after your procedure. After 7 days, you may progress as below.  You should remove your sling 24 hours after your procedure, unless otherwise instructed by your provider.     Wednesday August 04, 2021  Thursday August 05, 2021 Friday August 06, 2021 Saturday August 07, 2021   Do not lift, push, pull, or carry anything over 10 pounds with the affected arm until 6 weeks (Wednesday September 08, 2021 ) after your procedure.   You may drive AFTER your wound check, unless you have been told otherwise by your provider.   Ask your healthcare provider when you can go back to work   INCISION/Dressing If you are on a blood thinner such as Coumadin, Xarelto, Eliquis, Plavix, or Pradaxa please confirm with your provider when this should be resumed.   If large square, outer bandage is left in place, this can be removed after 24 hours from your procedure. Do not remove steri-strips or glue as below.   Monitor your Pacemaker site for redness, swelling, and drainage. Call the device clinic at [phone] if you experience these symptoms or fever/chills.  If your incision is sealed with Steri-strips or staples, you may shower 10 days after your procedure or when told by your provider. Do not remove the steri-strips or let the shower hit directly on your site. You may wash around your site with soap and water.    If you were discharged in a sling, please do not wear this during the day more than 48 hours after your surgery unless otherwise instructed. This may increase the risk of stiffness and soreness in your shoulder.   Avoid lotions, ointments, or perfumes over your incision until it is well-healed.  You may use a hot tub or a pool AFTER your wound check appointment if the incision is completely closed.  Pacemaker Alerts:  Some alerts are vibratory and others beep. These are NOT  emergencies. Please call our office to let us know. If this occurs at night or on weekends, it can wait until the next business day. Send a remote transmission.  If your device is capable of reading fluid status (for heart failure), you will be offered monthly monitoring to review this with you.   DEVICE MANAGEMENT Remote monitoring is used to monitor your pacemaker from home. This monitoring is scheduled every 91 days by our office. It allows [place] to keep an eye on the functioning of your device to ensure it is working properly. You will routinely see your Electrophysiologist annually (more often if necessary).   You should receive your ID card for your new device in 4-8 weeks. Keep this card with you at all times once received. Consider wearing a medical alert bracelet or necklace.  Your Pacemaker may be MRI compatible. This will be discussed at your next office visit/wound check.  You should avoid contact with strong electric or magnetic fields.   Do not use amateur (ham) radio equipment or electric (arc) welding torches. MP3 player headphones with magnets should not be used. Some devices are safe to use if held at least 12 inches (30 cm) from your Pacemaker. These include power tools, lawn mowers, and speakers. If you are unsure if something is safe to use, ask your health care provider.  When using your cell phone, hold it to the ear that is on the opposite side from the  Pacemaker. Do not leave your cell phone in a pocket over the Pacemaker.  You may safely use electric blankets, heating pads, computers, and microwave ovens.  Call the office right away if: You have chest pain. You feel more short of breath than you have felt before. You feel more light-headed than you have felt before. Your incision starts to open up.  This information is not intended to replace advice given to you by your health care provider. Make sure you discuss any questions you have with your health care provider.

## 2021-07-28 NOTE — Interval H&P Note (Signed)
History and Physical Interval Note:  07/28/2021 9:07 AM  Shane Ford  has presented today for surgery, with the diagnosis of bradicardia.  The various methods of treatment have been discussed with the patient and family. After consideration of risks, benefits and other options for treatment, the patient has consented to  Procedure(s): PACEMAKER IMPLANT (N/A) as a surgical intervention.  The patient's history has been reviewed, patient examined, no change in status, stable for surgery.  I have reviewed the patient's chart and labs.  Questions were answered to the patient's satisfaction.     Cristopher Peru

## 2021-07-29 ENCOUNTER — Encounter (HOSPITAL_COMMUNITY): Payer: Self-pay | Admitting: Internal Medicine

## 2021-07-30 ENCOUNTER — Telehealth: Payer: Self-pay

## 2021-07-30 NOTE — Telephone Encounter (Signed)
-----   Message from [place], [place] sent at 07/29/2021  3:19 PM EST ----- Same day discharge yesterday 2/1  GT MDT PPM

## 2021-07-30 NOTE — Telephone Encounter (Signed)
Spoke with patient regarding follow up from PPM implant on 07/28/21, patient has removed large clear bandage, steri strips intact, patient denied swelling,bleeding or drainage from site as well as fever or chills, patient voiced understanding of wound check 08/11/21 at 12:00pm, voiced understanding of 10 pound lifting restrictions. Remote Transmission received.

## 2021-08-05 DIAGNOSIS — L4050 Arthropathic psoriasis, unspecified: Secondary | ICD-10-CM | POA: Diagnosis not present

## 2021-08-05 DIAGNOSIS — Z79899 Other long term (current) drug therapy: Secondary | ICD-10-CM | POA: Diagnosis not present

## 2021-08-11 ENCOUNTER — Other Ambulatory Visit: Payer: Self-pay

## 2021-08-11 ENCOUNTER — Ambulatory Visit (INDEPENDENT_AMBULATORY_CARE_PROVIDER_SITE_OTHER): Payer: Medicare HMO

## 2021-08-11 DIAGNOSIS — I459 Conduction disorder, unspecified: Secondary | ICD-10-CM | POA: Diagnosis not present

## 2021-08-11 LAB — CUP PACEART INCLINIC DEVICE CHECK
Battery Remaining Longevity: 145 mo
Battery Voltage: 3.21 V
Brady Statistic AP VP Percent: 3.64 %
Brady Statistic AP VS Percent: 0.03 %
Brady Statistic AS VP Percent: 95.87 %
Brady Statistic AS VS Percent: 0.46 %
Brady Statistic RA Percent Paced: 3.78 %
Brady Statistic RV Percent Paced: 99.51 %
Date Time Interrogation Session: 20230215122505
Implantable Lead Implant Date: 20230201
Implantable Lead Implant Date: 20230201
Implantable Lead Location: 753859
Implantable Lead Location: 753860
Implantable Lead Model: 3830
Implantable Lead Model: 5076
Implantable Pulse Generator Implant Date: 20230201
Lead Channel Impedance Value: 323 Ohm
Lead Channel Impedance Value: 380 Ohm
Lead Channel Impedance Value: 418 Ohm
Lead Channel Impedance Value: 627 Ohm
Lead Channel Pacing Threshold Amplitude: 0.75 V
Lead Channel Pacing Threshold Amplitude: 1 V
Lead Channel Pacing Threshold Pulse Width: 0.4 ms
Lead Channel Pacing Threshold Pulse Width: 0.4 ms
Lead Channel Sensing Intrinsic Amplitude: 22 mV
Lead Channel Sensing Intrinsic Amplitude: 5.625 mV
Lead Channel Setting Pacing Amplitude: 3.5 V
Lead Channel Setting Pacing Amplitude: 3.5 V
Lead Channel Setting Pacing Pulse Width: 0.4 ms
Lead Channel Setting Sensing Sensitivity: 0.9 mV

## 2021-08-11 NOTE — Patient Instructions (Addendum)

## 2021-08-11 NOTE — Progress Notes (Signed)
Wound check appointment. Steri-strips removed. Wound without redness or edema. Incision edges approximated, wound well healed. Normal device function. Thresholds, P wave sensing, and impedances consistent with implant measurements. Device programmed at 3.5V for extra safety margin until 3 month visit. Histogram distribution appropriate for patient and level of activity. No mode switches or high ventricular rates noted. Patient educated about wound care, arm mobility, lifting restrictions. Patient is enrolled in remote monitoring, next scheduled check 10/28/21.  ROV with Dr. Lovena Le on 11/01/21.

## 2021-08-18 DIAGNOSIS — L570 Actinic keratosis: Secondary | ICD-10-CM | POA: Diagnosis not present

## 2021-08-18 DIAGNOSIS — D0439 Carcinoma in situ of skin of other parts of face: Secondary | ICD-10-CM | POA: Diagnosis not present

## 2021-08-18 DIAGNOSIS — C44329 Squamous cell carcinoma of skin of other parts of face: Secondary | ICD-10-CM | POA: Diagnosis not present

## 2021-10-28 ENCOUNTER — Ambulatory Visit (INDEPENDENT_AMBULATORY_CARE_PROVIDER_SITE_OTHER): Payer: Medicare HMO

## 2021-10-28 DIAGNOSIS — I442 Atrioventricular block, complete: Secondary | ICD-10-CM

## 2021-10-28 LAB — CUP PACEART REMOTE DEVICE CHECK
Battery Remaining Longevity: 155 mo
Battery Voltage: 3.19 V
Brady Statistic AP VP Percent: 5.59 %
Brady Statistic AP VS Percent: 0.01 %
Brady Statistic AS VP Percent: 94.01 %
Brady Statistic AS VS Percent: 0.39 %
Brady Statistic RA Percent Paced: 5.59 %
Brady Statistic RV Percent Paced: 99.6 %
Date Time Interrogation Session: 20230504051725
Implantable Lead Implant Date: 20230201
Implantable Lead Implant Date: 20230201
Implantable Lead Location: 753859
Implantable Lead Location: 753860
Implantable Lead Model: 3830
Implantable Lead Model: 5076
Implantable Pulse Generator Implant Date: 20230201
Lead Channel Impedance Value: 285 Ohm
Lead Channel Impedance Value: 361 Ohm
Lead Channel Impedance Value: 380 Ohm
Lead Channel Impedance Value: 608 Ohm
Lead Channel Pacing Threshold Amplitude: 0.75 V
Lead Channel Pacing Threshold Amplitude: 0.875 V
Lead Channel Pacing Threshold Pulse Width: 0.4 ms
Lead Channel Pacing Threshold Pulse Width: 0.4 ms
Lead Channel Sensing Intrinsic Amplitude: 19.875 mV
Lead Channel Sensing Intrinsic Amplitude: 19.875 mV
Lead Channel Sensing Intrinsic Amplitude: 5.5 mV
Lead Channel Sensing Intrinsic Amplitude: 5.5 mV
Lead Channel Setting Pacing Amplitude: 1.75 V
Lead Channel Setting Pacing Amplitude: 2 V
Lead Channel Setting Pacing Pulse Width: 0.4 ms
Lead Channel Setting Sensing Sensitivity: 0.9 mV

## 2021-11-01 ENCOUNTER — Ambulatory Visit: Payer: Medicare HMO | Admitting: Internal Medicine

## 2021-11-01 VITALS — BP 120/68 | HR 78 | Ht 74.0 in | Wt 188.2 lb

## 2021-11-01 DIAGNOSIS — I4430 Unspecified atrioventricular block: Secondary | ICD-10-CM | POA: Diagnosis not present

## 2021-11-01 DIAGNOSIS — Z950 Presence of cardiac pacemaker: Secondary | ICD-10-CM | POA: Insufficient documentation

## 2021-11-01 DIAGNOSIS — I493 Ventricular premature depolarization: Secondary | ICD-10-CM | POA: Diagnosis not present

## 2021-11-01 NOTE — Patient Instructions (Signed)
Medication Instructions:  ?Your physician recommends that you continue on your current medications as directed. Please refer to the Current Medication list given to you today. ? ?Labwork: ?None ordered. ? ?Testing/Procedures: ?None ordered. ? ?Follow-Up: ?Your physician wants you to follow-up in: one year with Cristopher Peru, MD or one of the following Advanced Practice Providers on your designated Care Team:   ?Tommye Standard, PA-C ?Legrand Como "Jonni Sanger" Ada, PA-C ? ?Remote monitoring is used to monitor your Pacemaker from home. This monitoring reduces the number of office visits required to check your device to one time per year. It allows [place] to keep an eye on the functioning of your device to ensure it is working properly. You are scheduled for a device check from home on 01/27/2022. You may send your transmission at any time that day. If you have a wireless device, the transmission will be sent automatically. After your physician reviews your transmission, you will receive a postcard with your next transmission date. ? ?Any Other Special Instructions Will Be Listed Below (If Applicable). ? ?If you need a refill on your cardiac medications before your next appointment, please call your pharmacy.  ? ?Important Information About Sugar ? ? ? ? ? ? ? ?

## 2021-11-01 NOTE — Progress Notes (Signed)
? ? ? ? ?HPI ?Shane Ford is referred today by Dr. Tamala Julian for evaluation of symptomatic PVCs and high-grade heart block.  The patient has not had syncope.  He does have dizzy spells and palpitations.  He has a history of coronary artery disease with preserved left ventricular function status post bypass surgery.  He denies anginal symptoms. He has done well since his PPM insertion. No chest pain or sob. He feels like he has more energy. ? ?No Known Allergies ? ? ?Current Outpatient Medications  ?Medication Sig Dispense Refill  ? acetaminophen (TYLENOL) 500 MG tablet Take 1,000 mg by mouth every 6 (six) hours as needed for moderate pain.    ? aspirin EC 81 MG tablet Take 81 mg by mouth in the morning. Swallow whole.    ? Cholecalciferol 50 MCG (2000 UT) CAPS Take 2,000 Units by mouth in the morning.    ? docusate sodium (COLACE) 100 MG capsule Take 200 mg by mouth 2 (two) times daily.    ? donepezil (ARICEPT) 5 MG tablet Take 5 mg by mouth at bedtime.    ? empagliflozin (JARDIANCE) 10 MG TABS tablet Take 1 tablet (10 mg total) by mouth daily before breakfast. 30 tablet 3  ? fenofibrate 160 MG tablet Take 160 mg by mouth in the morning.    ? folic acid (FOLVITE) 629 MCG tablet Take 800 mcg by mouth daily.    ? glimepiride (AMARYL) 1 MG tablet Take 1 mg by mouth in the morning and at bedtime.    ? hydrochlorothiazide (MICROZIDE) 12.5 MG capsule Take 1 capsule (12.5 mg total) by mouth daily. 90 capsule 3  ? HYDROcodone-acetaminophen (NORCO/VICODIN) 5-325 MG tablet Take 1 tablet by mouth every 6 (six) hours as needed for moderate pain or severe pain. 20 tablet 0  ? metFORMIN (GLUCOPHAGE) 500 MG tablet Take 500 mg by mouth 2 (two) times daily with a meal.    ? methocarbamol (ROBAXIN) 500 MG tablet Take 1 tablet (500 mg total) by mouth every 6 (six) hours as needed for muscle spasms. 60 tablet 0  ? methotrexate (RHEUMATREX) 2.5 MG tablet Take 12.5 mg by mouth every Wednesday. Take 6 tablets (12.'5mg'$ ) once a week on  Wednesdays. ?Caution:Chemotherapy. Protect from light.    ? naproxen sodium (ALEVE) 220 MG tablet Take 220 mg by mouth at bedtime.    ? polyvinyl alcohol (LIQUIFILM TEARS) 1.4 % ophthalmic solution Place 1 drop into both eyes as needed for dry eyes.    ? simvastatin (ZOCOR) 40 MG tablet Take 40 mg by mouth in the morning.    ? venlafaxine XR (EFFEXOR-XR) 37.5 MG 24 hr capsule Take 37.5 mg by mouth in the morning.    ? vitamin B-12 (CYANOCOBALAMIN) 1000 MCG tablet Take 1,000 mcg by mouth daily.    ? ?No current facility-administered medications for this visit.  ? ? ? ?Past Medical History:  ?Diagnosis Date  ? Arthritis   ? pt states he was diagnosed 10+ years ago; psoriatic arthritis  ? Coronary artery disease   ? Depression   ? pt diagnosed 10+ years ago  ? Diabetes mellitus ([place])   ? Dysrhythmia 01/17/2021  ? first degree AV block, 2nd degree AVB, Mobitz I in setting of MI  ? Hyperlipidemia   ? Hypertension   ? ? ?ROS: ? ? All systems reviewed and negative except as noted in the HPI. ? ? ?Past Surgical History:  ?Procedure Laterality Date  ? ANTERIOR CERVICAL DECOMP/DISCECTOMY FUSION N/A 05/12/2021  ?  Procedure: ANTERIOR CERVICAL DECOMPRESSION FUSION CERVICAL 5- CERVICAL 6 WITH INSTRUMENTATION AND ALLOGRAFT;  Surgeon: Phylliss Bob, MD;  Location: [place] [place];  Service: Orthopedics;  Laterality: N/A;  ? CARDIAC CATHETERIZATION    ? CORONARY ARTERY BYPASS GRAFT N/A 01/19/2021  ? Procedure: CORONARY ARTERY BYPASS GRAFTING (CABG) TIMES FOUR, ON PUMP, USING LEFT INTERNAL MAMMARY ARTERY AND ENDOSCOPICALLY HARVESTED GREATER SAPHENOUS VEIN;  Surgeon: Lajuana Matte, MD;  Location: [facility];  Service: Open Heart Surgery;  Laterality: N/A;  flow trac  ? ENDOVEIN HARVEST OF GREATER SAPHENOUS VEIN Right 01/19/2021  ? Procedure: ENDOVEIN HARVEST OF GREATER SAPHENOUS VEIN;  Surgeon: Lajuana Matte, MD;  Location: [facility];  Service: Open Heart Surgery;  Laterality: Right;  ? EYE SURGERY Bilateral 2021  ? cataracts replaced in  both eyes  ? LEFT HEART CATH AND CORONARY ANGIOGRAPHY N/A 01/17/2021  ? Procedure: LEFT HEART CATH AND CORONARY ANGIOGRAPHY;  Surgeon: Belva Crome, MD;  Location: [facility];  Service: Cardiovascular;  Laterality: N/A;  ? PACEMAKER IMPLANT N/A 07/28/2021  ? Procedure: PACEMAKER IMPLANT;  Surgeon: Evans Lance, MD;  Location: [facility];  Service: Cardiovascular;  Laterality: N/A;  ? TEE WITHOUT CARDIOVERSION N/A 01/19/2021  ? Procedure: TRANSESOPHAGEAL ECHOCARDIOGRAM (TEE);  Surgeon: Lajuana Matte, MD;  Location: [facility];  Service: Open Heart Surgery;  Laterality: N/A;  ? ? ? ?No family history on file. ? ? ?Social History  ? ?Socioeconomic History  ? Marital status: Married  ?  Spouse name: Not on file  ? Number of children: 2  ? Years of education: 64  ? Highest education level: Not on file  ?Occupational History  ? Occupation: Retired  ?Tobacco Use  ? Smoking status: Never  ? Smokeless tobacco: Never  ?Vaping Use  ? Vaping Use: Never used  ?Substance and Sexual Activity  ? Alcohol use: Never  ? Drug use: Never  ? Sexual activity: Not on file  ?Other Topics Concern  ? Not on file  ?Social History Narrative  ? Not on file  ? ?Social Determinants of Health  ? ?Financial Resource Strain: Not on file  ?Food Insecurity: Not on file  ?Transportation Needs: Not on file  ?Physical Activity: Not on file  ?Stress: Not on file  ?Social Connections: Not on file  ?Intimate Partner Violence: Not on file  ? ? ? ?BP 120/68   Pulse 78   Ht '6\' 2"'$  (1.88 m)   Wt 188 lb 3.2 oz (85.4 kg)   SpO2 95%   BMI 24.16 kg/m?  ? ?Physical Exam: ? ?Well appearing NAD ?HEENT: Unremarkable ?Neck:  No JVD, no thyromegally ?Lymphatics:  No adenopathy ?Back:  No CVA tenderness ?Lungs:  Clear with no wheezes ?HEART:  Regular rate rhythm, no murmurs, no rubs, no clicks ?Abd:  soft, positive bowel sounds, no organomegally, no rebound, no guarding ?Ext:  2 plus pulses, no edema, no cyanosis, no clubbing ?Skin:  No rashes no  nodules ?Neuro:  CN II through XII intact, motor grossly intact ? ?EKG - NSR with ventricular pacing ? ?DEVICE  ?Normal device function.  See PaceArt for details.  ? ?Assess/Plan:  ?High grade heart block - he is conducting today with a PR of over 300. We will leave him programmed DDD with a normal AV delay.  ?PVC's - asymptomatic. On device interrogation he has less than 1% PVC's. ?CAD - he denies anginal symptoms even though his HR is better and he is more active. ?PPM -  his medronic DDD PM is working normally. We will recheck in several months. ? ?Carleene Overlie Yu Peggs,MD ?

## 2021-11-02 DIAGNOSIS — L4050 Arthropathic psoriasis, unspecified: Secondary | ICD-10-CM | POA: Diagnosis not present

## 2021-11-02 DIAGNOSIS — Z79899 Other long term (current) drug therapy: Secondary | ICD-10-CM | POA: Diagnosis not present

## 2021-11-04 DIAGNOSIS — E1151 Type 2 diabetes mellitus with diabetic peripheral angiopathy without gangrene: Secondary | ICD-10-CM | POA: Diagnosis not present

## 2021-11-04 DIAGNOSIS — Z125 Encounter for screening for malignant neoplasm of prostate: Secondary | ICD-10-CM | POA: Diagnosis not present

## 2021-11-04 DIAGNOSIS — E538 Deficiency of other specified B group vitamins: Secondary | ICD-10-CM | POA: Diagnosis not present

## 2021-11-08 DIAGNOSIS — Z79899 Other long term (current) drug therapy: Secondary | ICD-10-CM | POA: Diagnosis not present

## 2021-11-08 DIAGNOSIS — M19042 Primary osteoarthritis, left hand: Secondary | ICD-10-CM | POA: Diagnosis not present

## 2021-11-08 DIAGNOSIS — M1811 Unilateral primary osteoarthritis of first carpometacarpal joint, right hand: Secondary | ICD-10-CM | POA: Diagnosis not present

## 2021-11-08 DIAGNOSIS — L4050 Arthropathic psoriasis, unspecified: Secondary | ICD-10-CM | POA: Diagnosis not present

## 2021-11-10 NOTE — Progress Notes (Signed)
Remote pacemaker transmission.   

## 2021-11-11 DIAGNOSIS — Z1389 Encounter for screening for other disorder: Secondary | ICD-10-CM | POA: Diagnosis not present

## 2021-11-11 DIAGNOSIS — Z0000 Encounter for general adult medical examination without abnormal findings: Secondary | ICD-10-CM | POA: Diagnosis not present

## 2021-11-11 DIAGNOSIS — F03A0 Unspecified dementia, mild, without behavioral disturbance, psychotic disturbance, mood disturbance, and anxiety: Secondary | ICD-10-CM | POA: Diagnosis not present

## 2021-11-11 DIAGNOSIS — E1151 Type 2 diabetes mellitus with diabetic peripheral angiopathy without gangrene: Secondary | ICD-10-CM | POA: Diagnosis not present

## 2021-11-11 DIAGNOSIS — Z23 Encounter for immunization: Secondary | ICD-10-CM | POA: Diagnosis not present

## 2021-11-11 DIAGNOSIS — F32A Depression, unspecified: Secondary | ICD-10-CM | POA: Diagnosis not present

## 2021-11-11 DIAGNOSIS — E119 Type 2 diabetes mellitus without complications: Secondary | ICD-10-CM | POA: Diagnosis not present

## 2021-11-11 DIAGNOSIS — L4050 Arthropathic psoriasis, unspecified: Secondary | ICD-10-CM | POA: Diagnosis not present

## 2021-11-11 DIAGNOSIS — M5090 Cervical disc disorder, unspecified, unspecified cervical region: Secondary | ICD-10-CM | POA: Insufficient documentation

## 2021-11-24 DIAGNOSIS — D225 Melanocytic nevi of trunk: Secondary | ICD-10-CM | POA: Diagnosis not present

## 2021-11-24 DIAGNOSIS — L820 Inflamed seborrheic keratosis: Secondary | ICD-10-CM | POA: Diagnosis not present

## 2021-11-24 DIAGNOSIS — L570 Actinic keratosis: Secondary | ICD-10-CM | POA: Diagnosis not present

## 2021-11-24 DIAGNOSIS — D2261 Melanocytic nevi of right upper limb, including shoulder: Secondary | ICD-10-CM | POA: Diagnosis not present

## 2021-11-24 DIAGNOSIS — L538 Other specified erythematous conditions: Secondary | ICD-10-CM | POA: Diagnosis not present

## 2021-11-24 DIAGNOSIS — L821 Other seborrheic keratosis: Secondary | ICD-10-CM | POA: Diagnosis not present

## 2021-11-24 DIAGNOSIS — Z85828 Personal history of other malignant neoplasm of skin: Secondary | ICD-10-CM | POA: Diagnosis not present

## 2021-12-09 DIAGNOSIS — E113393 Type 2 diabetes mellitus with moderate nonproliferative diabetic retinopathy without macular edema, bilateral: Secondary | ICD-10-CM | POA: Diagnosis not present

## 2022-01-15 NOTE — Progress Notes (Unsigned)
Cardiology Office Note:    Date:  01/17/2022   ID:  Shane Ford, DOB July 03, 1945, MRN [id number]  PCP:  Rusty Aus, MD  Cardiologist:  Sinclair Grooms, MD   Referring MD: Rusty Aus, MD   Chief Complaint  Patient presents with   Coronary Artery Disease   Follow-up    Permanent pacemaker   Hypertension   Hyperlipidemia    History of Present Illness:    Shane Ford is a 76 y.o. male with a hx of  hypertension, early dementia, primary hypertension, hyperlipidemia, diabetes mellitus type 2, and ST elevation MI related to occlusion of LV branch of RCA that was too far distal and too small to treat interventionally.  Successful four-vessel bypass with LIMA to LAD, SVG to PL, SVG to OM, SVG to diagonal 1 ([name])- 01/19/2021. Since last visit DDD pacer for AVB 07/28/2021.Marland Kitchen   She feels great now.  No loss of energy or angina.  No episodes of syncope.  No medication side effects.  Able to lay flat.  Pacemaker site and sternal incision are both well-healed at this point.  Past Medical History:  Diagnosis Date   Arthritis    pt states he was diagnosed 10+ years ago; psoriatic arthritis   Coronary artery disease    Depression    pt diagnosed 10+ years ago   Diabetes mellitus ([place])    Dysrhythmia 01/17/2021   first degree AV block, 2nd degree AVB, Mobitz I in setting of MI   Hyperlipidemia    Hypertension     Past Surgical History:  Procedure Laterality Date   ANTERIOR CERVICAL DECOMP/DISCECTOMY FUSION N/A 05/12/2021   Procedure: ANTERIOR CERVICAL DECOMPRESSION FUSION CERVICAL 5- CERVICAL 6 WITH INSTRUMENTATION AND ALLOGRAFT;  Surgeon: Phylliss Bob, MD;  Location: [facility];  Service: Orthopedics;  Laterality: N/A;   CARDIAC CATHETERIZATION     CORONARY ARTERY BYPASS GRAFT N/A 01/19/2021   Procedure: CORONARY ARTERY BYPASS GRAFTING (CABG) TIMES FOUR, ON PUMP, USING LEFT INTERNAL MAMMARY ARTERY AND ENDOSCOPICALLY HARVESTED GREATER SAPHENOUS VEIN;  Surgeon:  Lajuana Matte, MD;  Location: [facility];  Service: Open Heart Surgery;  Laterality: N/A;  flow trac   ENDOVEIN HARVEST OF GREATER SAPHENOUS VEIN Right 01/19/2021   Procedure: ENDOVEIN HARVEST OF GREATER SAPHENOUS VEIN;  Surgeon: Lajuana Matte, MD;  Location: [facility];  Service: Open Heart Surgery;  Laterality: Right;   EYE SURGERY Bilateral 2021   cataracts replaced in both eyes   LEFT HEART CATH AND CORONARY ANGIOGRAPHY N/A 01/17/2021   Procedure: LEFT HEART CATH AND CORONARY ANGIOGRAPHY;  Surgeon: Belva Crome, MD;  Location: [facility];  Service: Cardiovascular;  Laterality: N/A;   PACEMAKER IMPLANT N/A 07/28/2021   Procedure: PACEMAKER IMPLANT;  Surgeon: Evans Lance, MD;  Location: [facility];  Service: Cardiovascular;  Laterality: N/A;   TEE WITHOUT CARDIOVERSION N/A 01/19/2021   Procedure: TRANSESOPHAGEAL ECHOCARDIOGRAM (TEE);  Surgeon: Lajuana Matte, MD;  Location: [facility];  Service: Open Heart Surgery;  Laterality: N/A;    Current Medications: Current Meds  Medication Sig   acetaminophen (TYLENOL) 500 MG tablet Take 1,000 mg by mouth every 6 (six) hours as needed for moderate pain.   aspirin EC 81 MG tablet Take 81 mg by mouth in the morning. Swallow whole.   Cholecalciferol 50 MCG (2000 UT) CAPS Take 2,000 Units by mouth in the morning.   docusate sodium (COLACE) 100 MG capsule Take 200 mg by mouth 2 (two)  times daily.   donepezil (ARICEPT) 5 MG tablet Take 5 mg by mouth at bedtime.   empagliflozin (JARDIANCE) 10 MG TABS tablet Take 1 tablet (10 mg total) by mouth daily before breakfast.   fenofibrate 160 MG tablet Take 160 mg by mouth in the morning.   folic acid (FOLVITE) 637 MCG tablet Take 800 mcg by mouth daily.   glimepiride (AMARYL) 1 MG tablet Take 1 mg by mouth in the morning and at bedtime.   metFORMIN (GLUCOPHAGE) 500 MG tablet Take 500 mg by mouth 2 (two) times daily with a meal.   methotrexate (RHEUMATREX) 2.5 MG tablet Take 12.5 mg by  mouth every Wednesday. Take 6 tablets (12.'5mg'$ ) once a week on Wednesdays. Caution:Chemotherapy. Protect from light.   naproxen sodium (ALEVE) 220 MG tablet Take 220 mg by mouth at bedtime.   polyvinyl alcohol (LIQUIFILM TEARS) 1.4 % ophthalmic solution Place 1 drop into both eyes as needed for dry eyes.   simvastatin (ZOCOR) 40 MG tablet Take 40 mg by mouth in the morning.   venlafaxine XR (EFFEXOR-XR) 37.5 MG 24 hr capsule Take 37.5 mg by mouth in the morning.   vitamin B-12 (CYANOCOBALAMIN) 1000 MCG tablet Take 1,000 mcg by mouth daily.   [DISCONTINUED] hydrochlorothiazide (MICROZIDE) 12.5 MG capsule Take 1 capsule (12.5 mg total) by mouth daily.     Allergies:   Patient has no known allergies.   Social History   Socioeconomic History   Marital status: Married    Spouse name: Not on file   Number of children: 2   Years of education: 14   Highest education level: Not on file  Occupational History   Occupation: Retired  Tobacco Use   Smoking status: Never   Smokeless tobacco: Never  Vaping Use   Vaping Use: Never used  Substance and Sexual Activity   Alcohol use: Never   Drug use: Never   Sexual activity: Not on file  Other Topics Concern   Not on file  Social History Narrative   Not on file   Social Determinants of Health   Financial Resource Strain: Not on file  Food Insecurity: Not on file  Transportation Needs: Not on file  Physical Activity: Not on file  Stress: Not on file  Social Connections: Not on file     Family History: The patient's family history is not on file.  ROS:   Please see the history of present illness.    No neurological complaints.  All other systems reviewed and are negative.  EKGs/Labs/Other Studies Reviewed:    The following studies were reviewed today: No new imaging.  Recent laboratory data performed by his primary physician 2 months ago.  Kidney function and potassium were normal.  EKG:  EKG not repeated  Recent  Labs: 01/20/2021: Magnesium 2.2 02/02/2021: NT-Pro BNP 888 05/04/2021: ALT 12 07/20/2021: BUN 19; Creatinine, Ser 1.20; Hemoglobin 15.0; Platelets 255; Potassium 4.8; Sodium 145  Recent Lipid Panel    Component Value Date/Time   CHOL 156 01/17/2021 2325   TRIG 198 (H) 01/17/2021 2325   HDL 45 01/17/2021 2325   CHOLHDL 3.5 01/17/2021 2325   VLDL 40 01/17/2021 2325   LDLCALC 71 01/17/2021 2325    Physical Exam:    VS:  BP 120/62   Pulse 63   Ht '6\' 2"'$  (1.88 m)   Wt 182 lb 12.8 oz (82.9 kg)   SpO2 98%   BMI 23.47 kg/m     Wt Readings from Last 3 Encounters:  01/17/22 182 lb 12.8 oz (82.9 kg)  11/01/21 188 lb 3.2 oz (85.4 kg)  07/28/21 182 lb (82.6 kg)     GEN: Slender. No acute distress HEENT: Normal NECK: No JVD. LYMPHATICS: No lymphadenopathy CARDIAC: No murmur. RRR no gallop, or edema. VASCULAR:  Normal Pulses. No bruits. RESPIRATORY:  Clear to auscultation without rales, wheezing or rhonchi  ABDOMEN: Soft, non-tender, non-distended, No pulsatile mass, MUSCULOSKELETAL: No deformity  SKIN: Warm and dry NEUROLOGIC:  Alert and oriented x 3 PSYCHIATRIC:  Normal affect   ASSESSMENT:    1. Pacemaker   2. Primary hypertension   3. S/P CABG x 4   4. Type 2 diabetes mellitus with complication, without long-term current use of insulin ([place])   5. PVC's (premature ventricular contractions)   6. Complete heart block (HCC)    PLAN:    In order of problems listed above:  Follow-up in pacemaker clinic Excellent control Secondary prevention reviewed Hemoglobin A1c less than 7 is the target Monitor via device implant of heart block. Continue DDD pacemaker   Overall education and awareness concerning primary/secondary risk prevention was discussed in detail: LDL less than 70, hemoglobin A1c less than 7, blood pressure target less than 130/80 mmHg, >150 minutes of moderate aerobic activity per week, avoidance of smoking, weight control (via diet and exercise), and continued  surveillance/management of/for obstructive sleep apnea.  Establish with new cardiologist in 1 year.  We will still have device clinic requirements over the next 12 months with Dr. Lovena Le.   Medication Adjustments/Labs and Tests Ordered: Current medicines are reviewed at length with the patient today.  Concerns regarding medicines are outlined above.  No orders of the defined types were placed in this encounter.  Meds ordered this encounter  Medications   hydrochlorothiazide (MICROZIDE) 12.5 MG capsule    Sig: Take 1 capsule (12.5 mg total) by mouth daily.    Dispense:  90 capsule    Refill:  3    Patient Instructions  Medication Instructions:  Your physician recommends that you continue on your current medications as directed. Please refer to the Current Medication list given to you today.  *If you need a refill on your cardiac medications before your next appointment, please call your pharmacy*  Lab Work: NONE  Testing/Procedures: NONE  Follow-Up: At [facility], you and your health needs are our priority.  As part of our continuing mission to provide you with exceptional heart care, we have created designated Provider Care Teams.  These Care Teams include your primary Cardiologist (physician) and Advanced Practice Providers (APPs -  Physician Assistants and Nurse Practitioners) who all work together to provide you with the care you need, when you need it.  Your next appointment:   1 year(s)  The format for your next appointment:   In Person  Provider:   Sinclair Grooms, MD {  Important Information About Sugar         Signed, Sinclair Grooms, MD  01/17/2022 4:00 PM    [place]

## 2022-01-17 ENCOUNTER — Ambulatory Visit: Payer: Medicare HMO | Admitting: Interventional Cardiology

## 2022-01-17 ENCOUNTER — Encounter: Payer: Self-pay | Admitting: Interventional Cardiology

## 2022-01-17 VITALS — BP 120/62 | HR 63 | Ht 74.0 in | Wt 182.8 lb

## 2022-01-17 DIAGNOSIS — I10 Essential (primary) hypertension: Secondary | ICD-10-CM | POA: Diagnosis not present

## 2022-01-17 DIAGNOSIS — Z951 Presence of aortocoronary bypass graft: Secondary | ICD-10-CM | POA: Diagnosis not present

## 2022-01-17 DIAGNOSIS — Z950 Presence of cardiac pacemaker: Secondary | ICD-10-CM

## 2022-01-17 DIAGNOSIS — I442 Atrioventricular block, complete: Secondary | ICD-10-CM

## 2022-01-17 DIAGNOSIS — E118 Type 2 diabetes mellitus with unspecified complications: Secondary | ICD-10-CM

## 2022-01-17 DIAGNOSIS — I493 Ventricular premature depolarization: Secondary | ICD-10-CM

## 2022-01-17 MED ORDER — HYDROCHLOROTHIAZIDE 12.5 MG PO CAPS: 12.5000 mg | ORAL_CAPSULE | Freq: Every day | ORAL | 3 refills | Status: DC

## 2022-01-17 NOTE — Patient Instructions (Signed)

## 2022-01-27 ENCOUNTER — Ambulatory Visit (INDEPENDENT_AMBULATORY_CARE_PROVIDER_SITE_OTHER): Payer: Medicare HMO

## 2022-01-27 DIAGNOSIS — I442 Atrioventricular block, complete: Secondary | ICD-10-CM

## 2022-01-27 LAB — CUP PACEART REMOTE DEVICE CHECK
Battery Remaining Longevity: 151 mo
Battery Voltage: 3.15 V
Brady Statistic AP VP Percent: 10.86 %
Brady Statistic AP VS Percent: 0.02 %
Brady Statistic AS VP Percent: 88.9 %
Brady Statistic AS VS Percent: 0.22 %
Brady Statistic RA Percent Paced: 10.84 %
Brady Statistic RV Percent Paced: 99.76 %
Date Time Interrogation Session: 20230802234233
Implantable Lead Implant Date: 20230201
Implantable Lead Implant Date: 20230201
Implantable Lead Location: 753859
Implantable Lead Location: 753860
Implantable Lead Model: 3830
Implantable Lead Model: 5076
Implantable Pulse Generator Implant Date: 20230201
Lead Channel Impedance Value: 285 Ohm
Lead Channel Impedance Value: 361 Ohm
Lead Channel Impedance Value: 361 Ohm
Lead Channel Impedance Value: 570 Ohm
Lead Channel Pacing Threshold Amplitude: 0.75 V
Lead Channel Pacing Threshold Amplitude: 0.875 V
Lead Channel Pacing Threshold Pulse Width: 0.4 ms
Lead Channel Pacing Threshold Pulse Width: 0.4 ms
Lead Channel Sensing Intrinsic Amplitude: 17.25 mV
Lead Channel Sensing Intrinsic Amplitude: 17.25 mV
Lead Channel Sensing Intrinsic Amplitude: 6.125 mV
Lead Channel Sensing Intrinsic Amplitude: 6.125 mV
Lead Channel Setting Pacing Amplitude: 1.5 V
Lead Channel Setting Pacing Amplitude: 2 V
Lead Channel Setting Pacing Pulse Width: 0.4 ms
Lead Channel Setting Sensing Sensitivity: 0.9 mV

## 2022-02-09 DIAGNOSIS — L4050 Arthropathic psoriasis, unspecified: Secondary | ICD-10-CM | POA: Diagnosis not present

## 2022-02-09 DIAGNOSIS — Z79899 Other long term (current) drug therapy: Secondary | ICD-10-CM | POA: Diagnosis not present

## 2022-02-18 NOTE — Progress Notes (Signed)
Remote pacemaker transmission.   

## 2022-04-07 DIAGNOSIS — Z23 Encounter for immunization: Secondary | ICD-10-CM | POA: Diagnosis not present

## 2022-04-28 ENCOUNTER — Ambulatory Visit (INDEPENDENT_AMBULATORY_CARE_PROVIDER_SITE_OTHER): Payer: Medicare HMO

## 2022-04-28 DIAGNOSIS — I442 Atrioventricular block, complete: Secondary | ICD-10-CM

## 2022-04-28 LAB — CUP PACEART REMOTE DEVICE CHECK
Battery Remaining Longevity: 145 mo
Battery Voltage: 3.09 V
Brady Statistic AP VP Percent: 25.46 %
Brady Statistic AP VS Percent: 0.01 %
Brady Statistic AS VP Percent: 74.35 %
Brady Statistic AS VS Percent: 0.18 %
Brady Statistic RA Percent Paced: 25.44 %
Brady Statistic RV Percent Paced: 99.81 %
Date Time Interrogation Session: 20231102062113
Implantable Lead Connection Status: 753985
Implantable Lead Connection Status: 753985
Implantable Lead Implant Date: 20230201
Implantable Lead Implant Date: 20230201
Implantable Lead Location: 753859
Implantable Lead Location: 753860
Implantable Lead Model: 3830
Implantable Lead Model: 5076
Implantable Pulse Generator Implant Date: 20230201
Lead Channel Impedance Value: 285 Ohm
Lead Channel Impedance Value: 361 Ohm
Lead Channel Impedance Value: 361 Ohm
Lead Channel Impedance Value: 532 Ohm
Lead Channel Pacing Threshold Amplitude: 0.75 V
Lead Channel Pacing Threshold Amplitude: 0.875 V
Lead Channel Pacing Threshold Pulse Width: 0.4 ms
Lead Channel Pacing Threshold Pulse Width: 0.4 ms
Lead Channel Sensing Intrinsic Amplitude: 17.875 mV
Lead Channel Sensing Intrinsic Amplitude: 17.875 mV
Lead Channel Sensing Intrinsic Amplitude: 5.5 mV
Lead Channel Sensing Intrinsic Amplitude: 5.5 mV
Lead Channel Setting Pacing Amplitude: 1.5 V
Lead Channel Setting Pacing Amplitude: 2 V
Lead Channel Setting Pacing Pulse Width: 0.4 ms
Lead Channel Setting Sensing Sensitivity: 0.9 mV
Zone Setting Status: 755011
Zone Setting Status: 755011

## 2022-05-10 NOTE — Progress Notes (Signed)
Remote pacemaker transmission.   

## 2022-05-11 DIAGNOSIS — E1151 Type 2 diabetes mellitus with diabetic peripheral angiopathy without gangrene: Secondary | ICD-10-CM | POA: Diagnosis not present

## 2022-05-11 DIAGNOSIS — E538 Deficiency of other specified B group vitamins: Secondary | ICD-10-CM | POA: Diagnosis not present

## 2022-05-17 DIAGNOSIS — L4050 Arthropathic psoriasis, unspecified: Secondary | ICD-10-CM | POA: Diagnosis not present

## 2022-05-17 DIAGNOSIS — E119 Type 2 diabetes mellitus without complications: Secondary | ICD-10-CM | POA: Diagnosis not present

## 2022-05-17 DIAGNOSIS — Z125 Encounter for screening for malignant neoplasm of prostate: Secondary | ICD-10-CM | POA: Diagnosis not present

## 2022-05-17 DIAGNOSIS — F0390 Unspecified dementia without behavioral disturbance: Secondary | ICD-10-CM | POA: Diagnosis not present

## 2022-05-27 DIAGNOSIS — Z79899 Other long term (current) drug therapy: Secondary | ICD-10-CM | POA: Diagnosis not present

## 2022-05-27 DIAGNOSIS — L4050 Arthropathic psoriasis, unspecified: Secondary | ICD-10-CM | POA: Diagnosis not present

## 2022-06-02 DIAGNOSIS — H524 Presbyopia: Secondary | ICD-10-CM | POA: Diagnosis not present

## 2022-06-02 DIAGNOSIS — E113393 Type 2 diabetes mellitus with moderate nonproliferative diabetic retinopathy without macular edema, bilateral: Secondary | ICD-10-CM | POA: Diagnosis not present

## 2022-06-30 ENCOUNTER — Telehealth: Payer: Self-pay | Admitting: Interventional Cardiology

## 2022-06-30 NOTE — Telephone Encounter (Signed)
Pt is calling to ask Dr. Tamala Julian who he would recommend referring him to for his cardiac care due to his retirement.   Pt aware that I will forward this request to Dr. Tamala Julian and his RN for further review and advisement.  Pt aware that our office will follow-up with him accordingly thereafter, once Dr. Tamala Julian advises.   Pt verbalized understanding and agrees with this plan.

## 2022-06-30 NOTE — Telephone Encounter (Signed)
Patient calling to see what dr, dr Tamala Julian would recommend him to see. Please advise

## 2022-06-30 NOTE — Telephone Encounter (Signed)
Spoke with patient and shared the names of the 3 providers recommended by Dr. Tamala Julian.  Dr. Angelena Form, Dr. Marlou Porch, or Dr. Irish Lack.  Patient not due for follow-up until July 2024. He will look at provider information on website and call in March to schedule with provider of his choosing.  Patient expressed appreciation for call.

## 2022-07-07 IMAGING — MR MR HEAD WO/W CM
13 series · 48 of 48 positions shown · IV contrast (gadavist)
Comparison: No pertinent prior exams available for comparison.

CLINICAL DATA: Ataxia due to recent cerebrovascular accident.
Cerebrovascular accident involving posterior circulation. Additional
history provided by scanning technologist: Patient reports balance
issues, memory loss, trouble with forming sentences for 1 year.

EXAM:
MRI HEAD WITHOUT AND WITH CONTRAST
TECHNIQUE: Multiplanar, multiecho pulse sequences of the brain and surrounding
structures were obtained without and with intravenous contrast.
CONTRAST:  7.5mL GADAVIST GADOBUTROL 1 MMOL/ML IV SOLN

[Series 5: ax dwi_tracew · axial · 3.0mm · 0.60mm/px · z∈[-65,+87]mm · 2 of 48 slices shown]
[im 1/48]
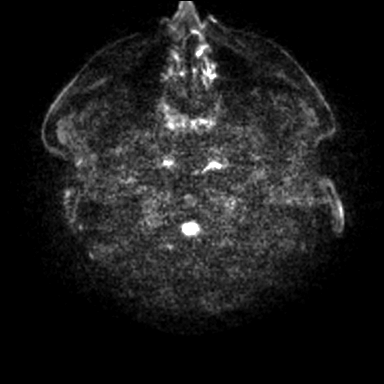
[im 48/48]
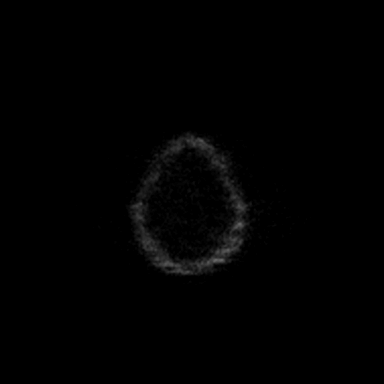

[Series 6: ax dwi_adc · axial · 3.0mm · 0.60mm/px · z∈[-65,+87]mm · 3 of 48 slices shown]
[im 1/48]
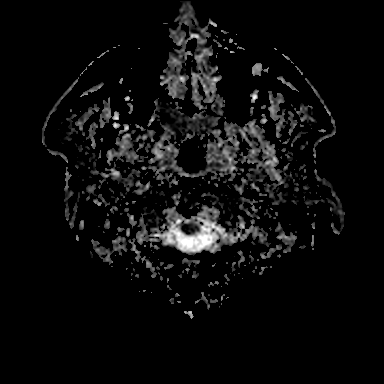
[im 24/48]
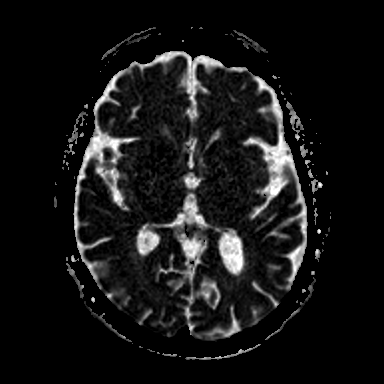
[im 48/48]
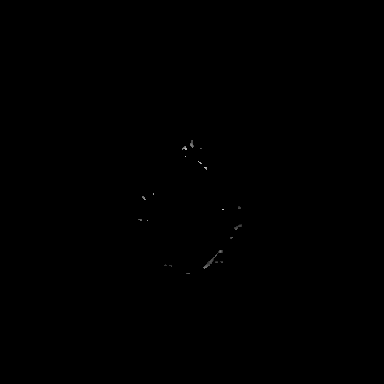

[Series 7: cor dwi_tracew · coronal · 5.0mm · 0.60mm/px · 2 of 38 slices shown]
[im 1/38]
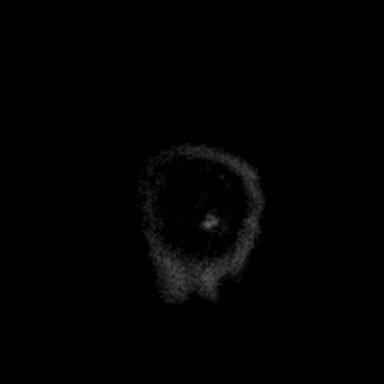
[im 38/38]
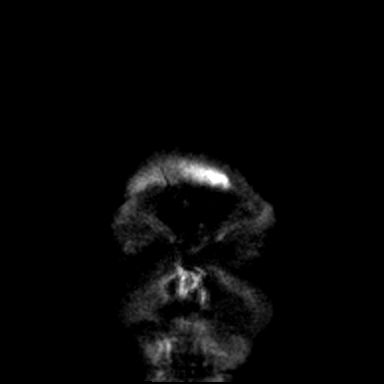

[Series 8: cor dwi_adc · coronal · 5.0mm · 0.60mm/px · 2 of 36 slices shown]
[im 1/36]
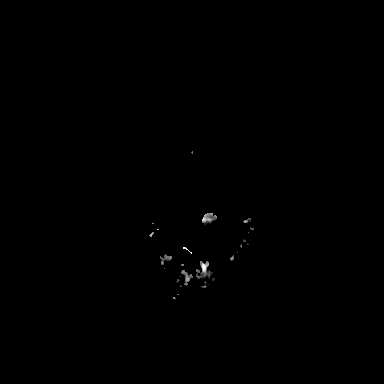
[im 36/36]
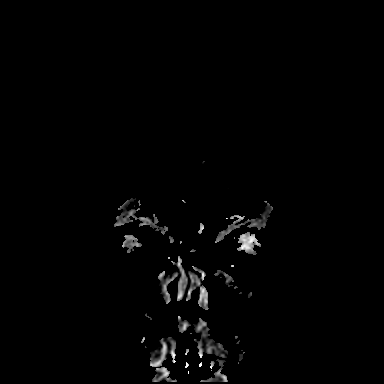

[Series 9: T1 · sagittal · 5.0mm · 0.62mm/px · 1 of 25 slices shown (1 of 2)]
[im 1/25]
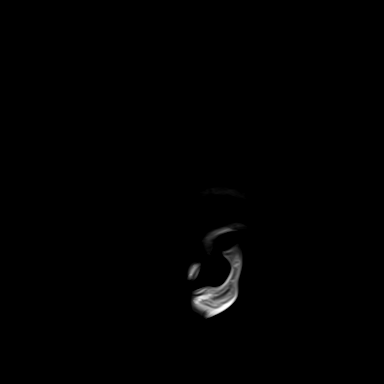

[Series 10: T2 · axial · 5.0mm · 0.53mm/px · 1 of 25 slices shown]
[im 1/25]
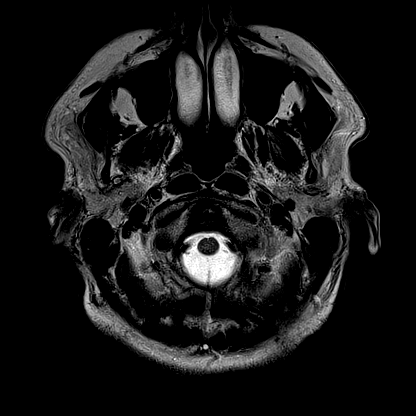

[Series 12: pha_images · axial · 3.0mm · 0.90mm/px · z∈[-75,+95]mm · 4 of 59 slices shown]
[im 1/59]
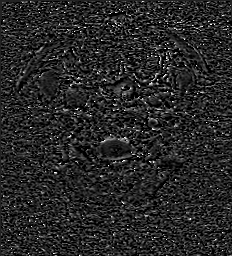
[im 20/59]
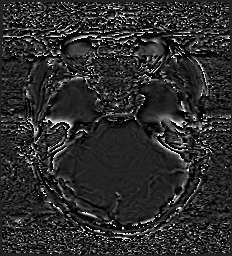
[im 39/59]
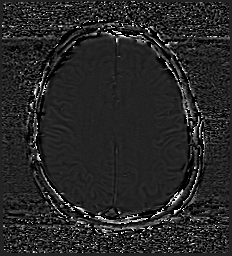
[im 59/59]
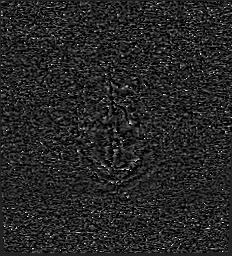

[Series 13: swi_images · axial · 3.0mm · 0.90mm/px · z∈[-75,+98]mm · 4 of 60 slices shown]
[im 1/60]
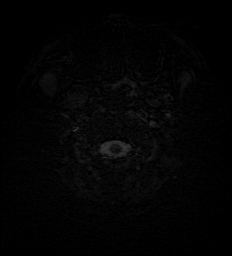
[im 20/60]
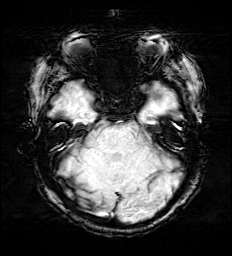
[im 40/60]
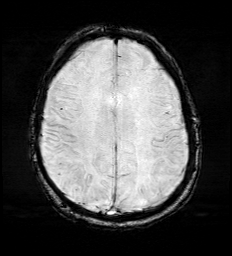
[im 60/60]
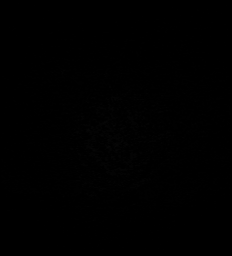

[Series 15: FLAIR · axial · 3.0mm · 0.53mm/px · z∈[-69,+90]mm · 3 of 55 slices shown]
[im 1/55]
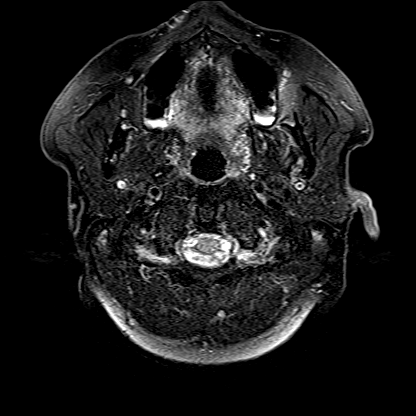
[im 28/55]
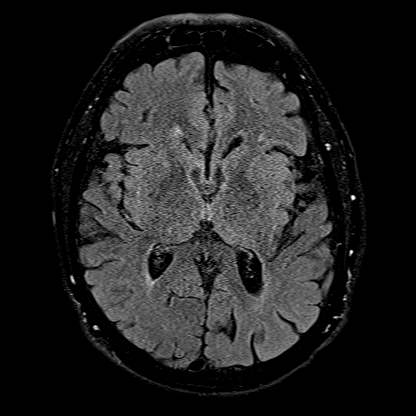
[im 55/55]
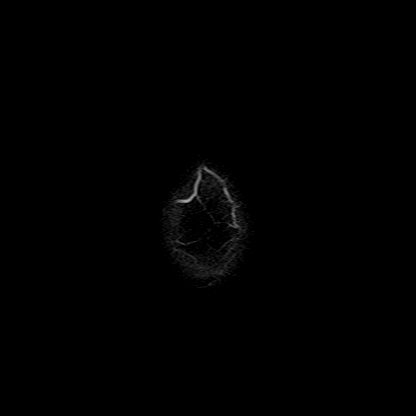

[Series 16: T1 · axial · 1.0mm · 0.98mm/px · z∈[-72,+99]mm · 11 of 176 slices shown (2 of 2)]
[im 1/176]
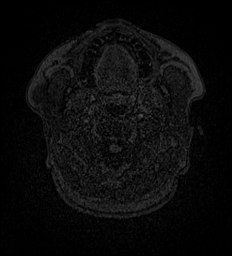
[im 18/176]
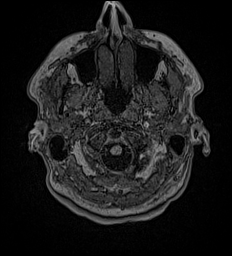
[im 36/176]
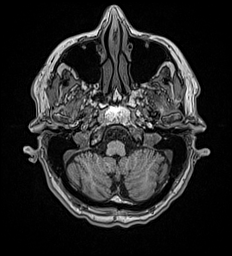
[im 53/176]
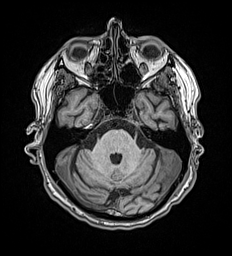
[im 71/176]
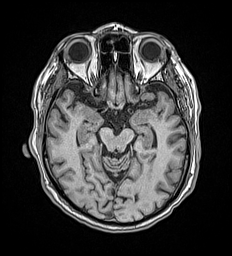
[im 88/176]
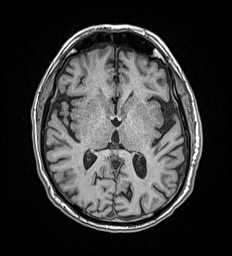
[im 106/176]
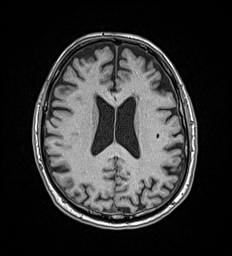
[im 123/176]
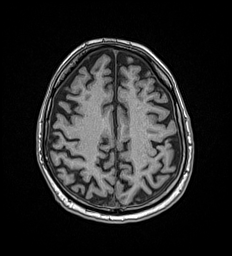
[im 141/176]
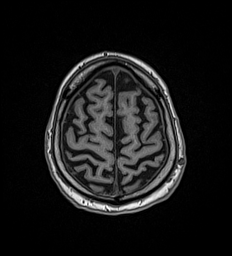
[im 158/176]
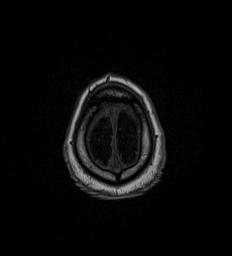
[im 176/176]
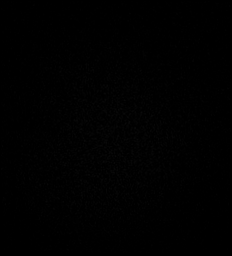

[Series 17: T2 post-contrast · coronal · 5.0mm · 0.57mm/px · 2 of 29 slices shown]
[im 1/29]
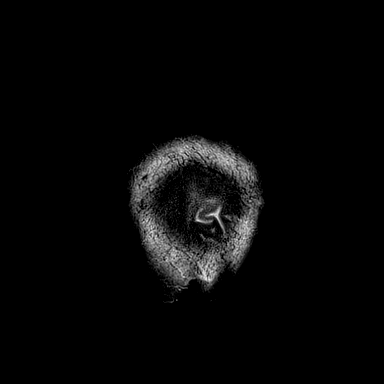
[im 29/29]
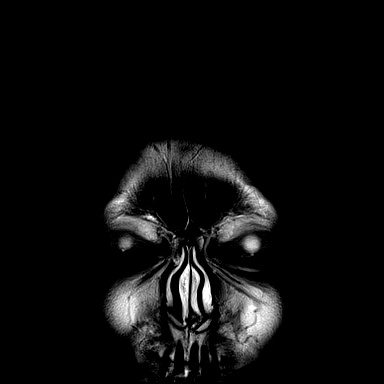

[Series 18: T1 post-contrast · axial · 1.0mm · 0.98mm/px · z∈[-72,+99]mm · 11 of 176 slices shown (1 of 2)]
[im 1/176]
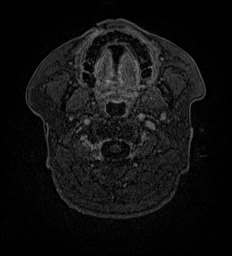
[im 18/176]
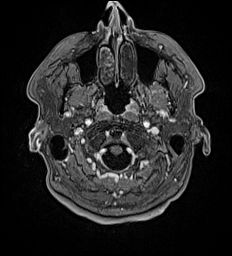
[im 36/176]
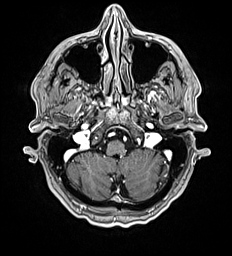
[im 53/176]
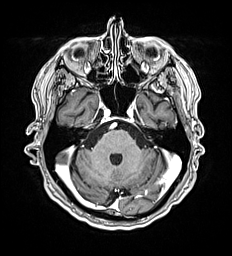
[im 71/176]
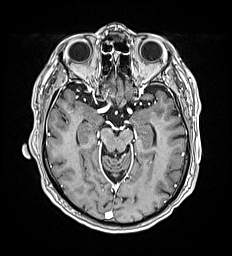
[im 88/176]
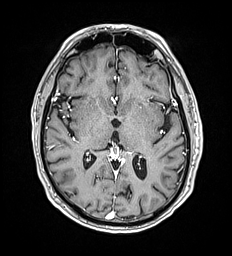
[im 106/176]
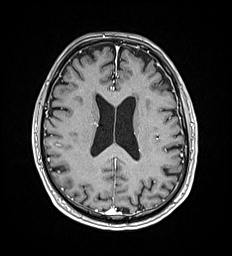
[im 123/176]
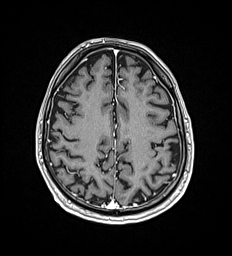
[im 141/176]
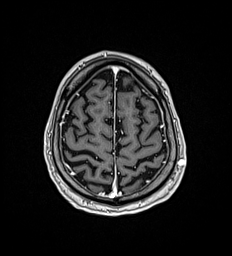
[im 158/176]
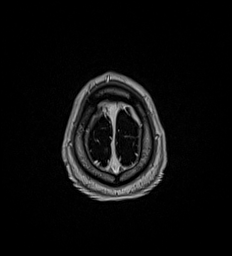
[im 176/176]
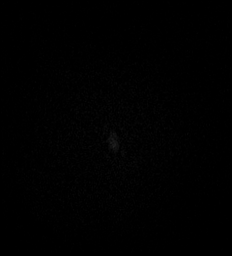

[Series 19: T1 post-contrast · coronal · 5.0mm · 0.57mm/px · 2 of 29 slices shown (2 of 2)]
[im 1/29]
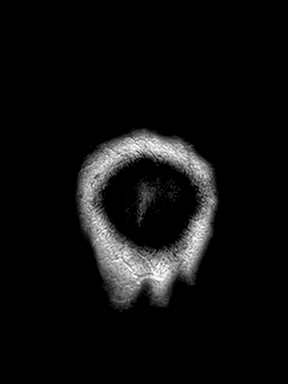
[im 29/29]
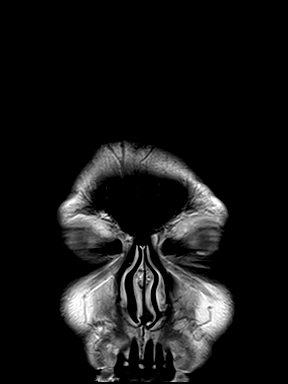

[48 of 48 positions shown; findings below may reference images not displayed]

FINDINGS: Brain:

Mild cerebral and cerebellar atrophy. Cerebral atrophy demonstrates
no definite lobar predominance.

Mild multifocal T2/FLAIR hyperintensity within the cerebral white
matter is nonspecific, but compatible with chronic small vessel
ischemic disease.

There is no acute infarct.

No evidence of intracranial mass.

No chronic intracranial blood products.

No extra-axial fluid collection.

No midline shift.

No abnormal intracranial enhancement.

Vascular: Expected proximal arterial flow voids.

Skull and upper cervical spine: No focal marrow lesion.

Sinuses/Orbits: Visualized orbits show no acute finding. Trace
ethmoid sinus mucosal thickening.

Other: 5 mm ovoid focus of enhancement within the left parietal
scalp, nonspecific (series 18, image 141)
IMPRESSION: No evidence of acute intracranial abnormality.

Mild cerebral and cerebellar atrophy.

Mild cerebral white matter chronic small vessel ischemic disease.

Nonspecific 5 mm ovoid focus of enhancement within the left parietal
scalp. Direct visualization is recommended.

## 2022-07-28 ENCOUNTER — Ambulatory Visit (INDEPENDENT_AMBULATORY_CARE_PROVIDER_SITE_OTHER): Payer: Medicare HMO

## 2022-07-28 DIAGNOSIS — I442 Atrioventricular block, complete: Secondary | ICD-10-CM

## 2022-07-28 LAB — CUP PACEART REMOTE DEVICE CHECK
Battery Remaining Longevity: 142 mo
Battery Voltage: 3.05 V
Brady Statistic AP VP Percent: 19.98 %
Brady Statistic AP VS Percent: 0.01 %
Brady Statistic AS VP Percent: 79.79 %
Brady Statistic AS VS Percent: 0.23 %
Brady Statistic RA Percent Paced: 19.96 %
Brady Statistic RV Percent Paced: 99.76 %
Date Time Interrogation Session: 20240201051144
Implantable Lead Connection Status: 753985
Implantable Lead Connection Status: 753985
Implantable Lead Implant Date: 20230201
Implantable Lead Implant Date: 20230201
Implantable Lead Location: 753859
Implantable Lead Location: 753860
Implantable Lead Model: 3830
Implantable Lead Model: 5076
Implantable Pulse Generator Implant Date: 20230201
Lead Channel Impedance Value: 285 Ohm
Lead Channel Impedance Value: 361 Ohm
Lead Channel Impedance Value: 361 Ohm
Lead Channel Impedance Value: 513 Ohm
Lead Channel Pacing Threshold Amplitude: 0.75 V
Lead Channel Pacing Threshold Amplitude: 0.875 V
Lead Channel Pacing Threshold Pulse Width: 0.4 ms
Lead Channel Pacing Threshold Pulse Width: 0.4 ms
Lead Channel Sensing Intrinsic Amplitude: 17 mV
Lead Channel Sensing Intrinsic Amplitude: 17 mV
Lead Channel Sensing Intrinsic Amplitude: 5.375 mV
Lead Channel Sensing Intrinsic Amplitude: 5.375 mV
Lead Channel Setting Pacing Amplitude: 1.5 V
Lead Channel Setting Pacing Amplitude: 2 V
Lead Channel Setting Pacing Pulse Width: 0.4 ms
Lead Channel Setting Sensing Sensitivity: 0.9 mV
Zone Setting Status: 755011
Zone Setting Status: 755011

## 2022-08-03 DIAGNOSIS — Z85828 Personal history of other malignant neoplasm of skin: Secondary | ICD-10-CM | POA: Diagnosis not present

## 2022-08-03 DIAGNOSIS — D485 Neoplasm of uncertain behavior of skin: Secondary | ICD-10-CM | POA: Diagnosis not present

## 2022-08-03 DIAGNOSIS — L821 Other seborrheic keratosis: Secondary | ICD-10-CM | POA: Diagnosis not present

## 2022-08-03 DIAGNOSIS — D2272 Melanocytic nevi of left lower limb, including hip: Secondary | ICD-10-CM | POA: Diagnosis not present

## 2022-08-03 DIAGNOSIS — D225 Melanocytic nevi of trunk: Secondary | ICD-10-CM | POA: Diagnosis not present

## 2022-08-03 DIAGNOSIS — L820 Inflamed seborrheic keratosis: Secondary | ICD-10-CM | POA: Diagnosis not present

## 2022-08-03 DIAGNOSIS — L570 Actinic keratosis: Secondary | ICD-10-CM | POA: Diagnosis not present

## 2022-08-03 DIAGNOSIS — D2261 Melanocytic nevi of right upper limb, including shoulder: Secondary | ICD-10-CM | POA: Diagnosis not present

## 2022-08-03 DIAGNOSIS — D044 Carcinoma in situ of skin of scalp and neck: Secondary | ICD-10-CM | POA: Diagnosis not present

## 2022-08-03 DIAGNOSIS — X32XXXA Exposure to sunlight, initial encounter: Secondary | ICD-10-CM | POA: Diagnosis not present

## 2022-08-18 NOTE — Progress Notes (Signed)
Remote pacemaker transmission.   

## 2022-09-21 DIAGNOSIS — D044 Carcinoma in situ of skin of scalp and neck: Secondary | ICD-10-CM | POA: Diagnosis not present

## 2022-09-28 DIAGNOSIS — L4050 Arthropathic psoriasis, unspecified: Secondary | ICD-10-CM | POA: Diagnosis not present

## 2022-09-28 DIAGNOSIS — Z79899 Other long term (current) drug therapy: Secondary | ICD-10-CM | POA: Diagnosis not present

## 2022-10-22 ENCOUNTER — Other Ambulatory Visit: Payer: Self-pay

## 2022-10-22 ENCOUNTER — Encounter (HOSPITAL_COMMUNITY)
Admission: EM | Disposition: A | Discharge status: Home / Self Care | Payer: Self-pay | Source: Home / Self Care | Attending: Interventional Cardiology

## 2022-10-22 ENCOUNTER — Emergency Department (HOSPITAL_COMMUNITY): Payer: Medicare HMO

## 2022-10-22 ENCOUNTER — Inpatient Hospital Stay (HOSPITAL_COMMUNITY)
Admission: EM | Admit: 2022-10-22 | Discharge: 2022-10-23 | DRG: 250 | Disposition: A | Discharge status: Home / Self Care | Payer: Medicare HMO | Attending: Cardiology | Admitting: Cardiology

## 2022-10-22 DIAGNOSIS — F0394 Unspecified dementia, unspecified severity, with anxiety: Secondary | ICD-10-CM | POA: Diagnosis not present

## 2022-10-22 DIAGNOSIS — I2510 Atherosclerotic heart disease of native coronary artery without angina pectoris: Secondary | ICD-10-CM | POA: Diagnosis present

## 2022-10-22 DIAGNOSIS — E119 Type 2 diabetes mellitus without complications: Secondary | ICD-10-CM | POA: Diagnosis not present

## 2022-10-22 DIAGNOSIS — Z951 Presence of aortocoronary bypass graft: Secondary | ICD-10-CM | POA: Diagnosis not present

## 2022-10-22 DIAGNOSIS — R0789 Other chest pain: Secondary | ICD-10-CM | POA: Diagnosis not present

## 2022-10-22 DIAGNOSIS — I21A9 Other myocardial infarction type: Secondary | ICD-10-CM | POA: Diagnosis not present

## 2022-10-22 DIAGNOSIS — T82867A Thrombosis of cardiac prosthetic devices, implants and grafts, initial encounter: Secondary | ICD-10-CM | POA: Diagnosis not present

## 2022-10-22 DIAGNOSIS — I2111 ST elevation (STEMI) myocardial infarction involving right coronary artery: Secondary | ICD-10-CM | POA: Diagnosis not present

## 2022-10-22 DIAGNOSIS — I2119 ST elevation (STEMI) myocardial infarction involving other coronary artery of inferior wall: Secondary | ICD-10-CM | POA: Diagnosis not present

## 2022-10-22 DIAGNOSIS — I10 Essential (primary) hypertension: Secondary | ICD-10-CM | POA: Diagnosis not present

## 2022-10-22 DIAGNOSIS — F411 Generalized anxiety disorder: Secondary | ICD-10-CM | POA: Diagnosis present

## 2022-10-22 DIAGNOSIS — Z79899 Other long term (current) drug therapy: Secondary | ICD-10-CM

## 2022-10-22 DIAGNOSIS — I213 ST elevation (STEMI) myocardial infarction of unspecified site: Principal | ICD-10-CM

## 2022-10-22 DIAGNOSIS — I25810 Atherosclerosis of coronary artery bypass graft(s) without angina pectoris: Secondary | ICD-10-CM | POA: Diagnosis present

## 2022-10-22 DIAGNOSIS — Y831 Surgical operation with implant of artificial internal device as the cause of abnormal reaction of the patient, or of later complication, without mention of misadventure at the time of the procedure: Secondary | ICD-10-CM | POA: Diagnosis present

## 2022-10-22 DIAGNOSIS — L4050 Arthropathic psoriasis, unspecified: Secondary | ICD-10-CM | POA: Diagnosis not present

## 2022-10-22 DIAGNOSIS — I97190 Other postprocedural cardiac functional disturbances following cardiac surgery: Secondary | ICD-10-CM | POA: Diagnosis present

## 2022-10-22 DIAGNOSIS — E785 Hyperlipidemia, unspecified: Secondary | ICD-10-CM | POA: Diagnosis present

## 2022-10-22 DIAGNOSIS — R231 Pallor: Secondary | ICD-10-CM | POA: Diagnosis not present

## 2022-10-22 DIAGNOSIS — I252 Old myocardial infarction: Secondary | ICD-10-CM

## 2022-10-22 DIAGNOSIS — Z7984 Long term (current) use of oral hypoglycemic drugs: Secondary | ICD-10-CM

## 2022-10-22 DIAGNOSIS — R0902 Hypoxemia: Secondary | ICD-10-CM | POA: Diagnosis not present

## 2022-10-22 DIAGNOSIS — R079 Chest pain, unspecified: Secondary | ICD-10-CM | POA: Diagnosis not present

## 2022-10-22 HISTORY — DX: ST elevation (STEMI) myocardial infarction involving other coronary artery of inferior wall: I21.19

## 2022-10-22 HISTORY — PX: LEFT HEART CATH AND CORS/GRAFTS ANGIOGRAPHY: CATH118250

## 2022-10-22 HISTORY — PX: CORONARY/GRAFT ACUTE MI REVASCULARIZATION: CATH118305

## 2022-10-22 LAB — COMPREHENSIVE METABOLIC PANEL
ALT: 18 U/L (ref 0–44)
AST: 27 U/L (ref 15–41)
Albumin: 3.6 g/dL (ref 3.5–5.0)
Alkaline Phosphatase: 29 U/L — ABNORMAL LOW (ref 38–126)
Anion gap: 9 (ref 5–15)
BUN: 18 mg/dL (ref 8–23)
CO2: 23 mmol/L (ref 22–32)
Calcium: 8.7 mg/dL — ABNORMAL LOW (ref 8.9–10.3)
Chloride: 108 mmol/L (ref 98–111)
Creatinine, Ser: 1.14 mg/dL (ref 0.61–1.24)
GFR, Estimated: >60 mL/min (ref >60)
Glucose, Bld: 166 mg/dL — ABNORMAL HIGH (ref 70–99)
Potassium: 3.5 mmol/L (ref 3.5–5.1)
Sodium: 140 mmol/L (ref 135–145)
Total Bilirubin: 1.1 mg/dL (ref 0.3–1.2)
Total Protein: 5.6 g/dL — ABNORMAL LOW (ref 6.5–8.1)

## 2022-10-22 LAB — CBG MONITORING, ED: Glucose-Capillary: 154 mg/dL — ABNORMAL HIGH (ref 70–99)

## 2022-10-22 LAB — CBC WITH DIFFERENTIAL/PLATELET
Abs Immature Granulocytes: 0.01 10*3/uL (ref 0.00–0.07)
Basophils Absolute: 0 10*3/uL (ref 0.0–0.1)
Basophils Relative: 0 %
Eosinophils Absolute: 0.4 10*3/uL (ref 0.0–0.5)
Eosinophils Relative: 6 %
HCT: 42.1 % (ref 39.0–52.0)
Hemoglobin: 13.9 g/dL (ref 13.0–17.0)
Immature Granulocytes: 0 %
Lymphocytes Relative: 43 %
Lymphs Abs: 2.9 10*3/uL (ref 0.7–4.0)
MCH: 29.8 pg (ref 26.0–34.0)
MCHC: 33 g/dL (ref 30.0–36.0)
MCV: 90.1 fL (ref 80.0–100.0)
Monocytes Absolute: 0.6 10*3/uL (ref 0.1–1.0)
Monocytes Relative: 10 %
Neutro Abs: 2.8 10*3/uL (ref 1.7–7.7)
Neutrophils Relative %: 41 %
Platelets: 197 10*3/uL (ref 150–400)
RBC: 4.67 MIL/uL (ref 4.22–5.81)
RDW: 14.5 % (ref 11.5–15.5)
WBC: 6.8 10*3/uL (ref 4.0–10.5)
nRBC: 0 % (ref 0.0–0.2)

## 2022-10-22 LAB — HEMOGLOBIN A1C
Hgb A1c MFr Bld: 6.8 % — ABNORMAL HIGH (ref 4.8–5.6)
Mean Plasma Glucose: 148.46 mg/dL

## 2022-10-22 LAB — POCT ACTIVATED CLOTTING TIME
Activated Clotting Time: 266 seconds
Activated Clotting Time: 217 seconds
Activated Clotting Time: 239 seconds
Activated Clotting Time: 206 seconds
Activated Clotting Time: 179 seconds
Activated Clotting Time: 163 seconds

## 2022-10-22 LAB — TROPONIN I (HIGH SENSITIVITY)
Troponin I (High Sensitivity): 9 ng/L (ref <18)
Troponin I (High Sensitivity): 3593 ng/L (ref <18)

## 2022-10-22 LAB — APTT: aPTT: 23 seconds — ABNORMAL LOW (ref 24–36)

## 2022-10-22 LAB — GLUCOSE, CAPILLARY
Glucose-Capillary: 105 mg/dL — ABNORMAL HIGH (ref 70–99)
Glucose-Capillary: 167 mg/dL — ABNORMAL HIGH (ref 70–99)

## 2022-10-22 LAB — LIPID PANEL
Cholesterol: 132 mg/dL (ref 0–200)
HDL: 48 mg/dL (ref >40)
LDL Cholesterol: 67 mg/dL (ref 0–99)
Total CHOL/HDL Ratio: 2.8 RATIO
Triglycerides: 87 mg/dL (ref <150)
VLDL: 17 mg/dL (ref 0–40)

## 2022-10-22 LAB — PROTIME-INR
INR: 1.1 (ref 0.8–1.2)
Prothrombin Time: 14.1 seconds (ref 11.4–15.2)

## 2022-10-22 LAB — MRSA NEXT GEN BY PCR, NASAL: MRSA by PCR Next Gen: NOT DETECTED

## 2022-10-22 SURGERY — CORONARY/GRAFT ACUTE MI REVASCULARIZATION
Anesthesia: LOCAL

## 2022-10-22 MED ORDER — MIDAZOLAM HCL 2 MG/2ML IJ SOLN
INTRAMUSCULAR | Status: DC | PRN
  Administered 2022-10-22: 1 mg via INTRAVENOUS

## 2022-10-22 MED ORDER — SODIUM CHLORIDE 0.9 % IV BOLUS: 1000.0000 mL | Freq: Once | INTRAVENOUS | Status: DC

## 2022-10-22 MED ORDER — TICAGRELOR 90 MG PO TABS
ORAL_TABLET | ORAL | Status: AC
  Filled 2022-10-22: qty 1

## 2022-10-22 MED ORDER — VITAMIN D 25 MCG (1000 UNIT) PO TABS
2000.0000 [IU] | ORAL_TABLET | Freq: Every day | ORAL | Status: DC
  Administered 2022-10-22 – 2022-10-23 (×2): 2000 [IU] via ORAL
  Filled 2022-10-22 (×2): qty 2

## 2022-10-22 MED ORDER — FENOFIBRATE 160 MG PO TABS
160.0000 mg | ORAL_TABLET | Freq: Every morning | ORAL | Status: DC
  Administered 2022-10-22 – 2022-10-23 (×2): 160 mg via ORAL
  Filled 2022-10-22 (×2): qty 1

## 2022-10-22 MED ORDER — VITAMIN B-12 1000 MCG PO TABS
1000.0000 ug | ORAL_TABLET | Freq: Every day | ORAL | Status: DC
  Administered 2022-10-22 – 2022-10-23 (×2): 1000 ug via ORAL
  Filled 2022-10-22 (×2): qty 1

## 2022-10-22 MED ORDER — HYDRALAZINE HCL 20 MG/ML IJ SOLN: 10.0000 mg | INTRAMUSCULAR | Status: AC | PRN

## 2022-10-22 MED ORDER — LIDOCAINE HCL (PF) 1 % IJ SOLN
INTRAMUSCULAR | Status: DC | PRN
  Administered 2022-10-22: 15 mL via INTRADERMAL

## 2022-10-22 MED ORDER — SODIUM CHLORIDE 0.9 % IV BOLUS
1000.0000 mL | Freq: Once | INTRAVENOUS | Status: AC
  Administered 2022-10-22: 1000 mL via INTRAVENOUS

## 2022-10-22 MED ORDER — TICAGRELOR 90 MG PO TABS
ORAL_TABLET | ORAL | Status: DC | PRN
  Administered 2022-10-22: 180 mg via ORAL

## 2022-10-22 MED ORDER — DOCUSATE SODIUM 100 MG PO CAPS
200.0000 mg | ORAL_CAPSULE | Freq: Two times a day (BID) | ORAL | Status: DC
  Administered 2022-10-22 – 2022-10-23 (×3): 200 mg via ORAL
  Filled 2022-10-22 (×3): qty 2

## 2022-10-22 MED ORDER — LABETALOL HCL 5 MG/ML IV SOLN: 10.0000 mg | INTRAVENOUS | Status: AC | PRN

## 2022-10-22 MED ORDER — SODIUM CHLORIDE 0.9% FLUSH
3.0000 mL | Freq: Two times a day (BID) | INTRAVENOUS | Status: DC
  Administered 2022-10-22 – 2022-10-23 (×3): 3 mL via INTRAVENOUS

## 2022-10-22 MED ORDER — SODIUM CHLORIDE 0.9% FLUSH: 3.0000 mL | INTRAVENOUS | Status: DC | PRN

## 2022-10-22 MED ORDER — MIDAZOLAM HCL 2 MG/2ML IJ SOLN
INTRAMUSCULAR | Status: AC
  Filled 2022-10-22: qty 2

## 2022-10-22 MED ORDER — HEPARIN SODIUM (PORCINE) 5000 UNIT/ML IJ SOLN
4000.0000 [IU] | Freq: Once | INTRAMUSCULAR | Status: AC
  Administered 2022-10-22: 4000 [IU] via INTRAVENOUS

## 2022-10-22 MED ORDER — POLYVINYL ALCOHOL 1.4 % OP SOLN: 1.0000 [drp] | OPHTHALMIC | Status: DC | PRN

## 2022-10-22 MED ORDER — GLIMEPIRIDE 1 MG PO TABS
1.0000 mg | ORAL_TABLET | Freq: Every day | ORAL | Status: DC
  Administered 2022-10-23: 1 mg via ORAL
  Filled 2022-10-22: qty 1

## 2022-10-22 MED ORDER — SIMVASTATIN 20 MG PO TABS: 40.0000 mg | ORAL_TABLET | Freq: Every morning | ORAL | Status: DC

## 2022-10-22 MED ORDER — HEPARIN SODIUM (PORCINE) 1000 UNIT/ML IJ SOLN
INTRAMUSCULAR | Status: DC | PRN
  Administered 2022-10-22: 9000 [IU] via INTRAVENOUS
  Administered 2022-10-22: 3000 [IU] via INTRAVENOUS

## 2022-10-22 MED ORDER — ACETAMINOPHEN 325 MG PO TABS: 650.0000 mg | ORAL_TABLET | ORAL | Status: DC | PRN

## 2022-10-22 MED ORDER — NAPROXEN SODIUM 220 MG PO TABS: 220.0000 mg | ORAL_TABLET | Freq: Every day | ORAL | Status: DC

## 2022-10-22 MED ORDER — ASPIRIN 81 MG PO TBEC: 81.0000 mg | DELAYED_RELEASE_TABLET | Freq: Every morning | ORAL | Status: DC

## 2022-10-22 MED ORDER — FOLIC ACID 800 MCG PO TABS: 800.0000 ug | ORAL_TABLET | Freq: Every day | ORAL | Status: DC

## 2022-10-22 MED ORDER — SODIUM CHLORIDE 0.9 % IV SOLN: 250.0000 mL | INTRAVENOUS | Status: DC | PRN

## 2022-10-22 MED ORDER — ATORVASTATIN CALCIUM 80 MG PO TABS
80.0000 mg | ORAL_TABLET | Freq: Every day | ORAL | Status: DC
  Administered 2022-10-22 – 2022-10-23 (×2): 80 mg via ORAL
  Filled 2022-10-22 (×2): qty 1

## 2022-10-22 MED ORDER — CHOLECALCIFEROL 50 MCG (2000 UT) PO CAPS
2000.0000 [IU] | ORAL_CAPSULE | Freq: Every morning | ORAL | Status: DC
  Filled 2022-10-22: qty 1

## 2022-10-22 MED ORDER — ORAL CARE MOUTH RINSE: 15.0000 mL | OROMUCOSAL | Status: DC | PRN

## 2022-10-22 MED ORDER — HEPARIN SODIUM (PORCINE) 1000 UNIT/ML IJ SOLN
INTRAMUSCULAR | Status: AC
  Filled 2022-10-22: qty 10

## 2022-10-22 MED ORDER — ACETAMINOPHEN 500 MG PO TABS: 1000.0000 mg | ORAL_TABLET | Freq: Four times a day (QID) | ORAL | Status: DC | PRN

## 2022-10-22 MED ORDER — CHLORHEXIDINE GLUCONATE CLOTH 2 % EX PADS
6.0000 | MEDICATED_PAD | Freq: Every day | CUTANEOUS | Status: DC
  Administered 2022-10-23: 6 via TOPICAL

## 2022-10-22 MED ORDER — FOLIC ACID 1 MG PO TABS
1.0000 mg | ORAL_TABLET | Freq: Every day | ORAL | Status: DC
  Administered 2022-10-22 – 2022-10-23 (×2): 1 mg via ORAL
  Filled 2022-10-22 (×2): qty 1

## 2022-10-22 MED ORDER — TICAGRELOR 90 MG PO TABS
90.0000 mg | ORAL_TABLET | Freq: Two times a day (BID) | ORAL | Status: DC
  Administered 2022-10-22 – 2022-10-23 (×2): 90 mg via ORAL
  Filled 2022-10-22 (×2): qty 1

## 2022-10-22 MED ORDER — VENLAFAXINE HCL ER 37.5 MG PO CP24
37.5000 mg | ORAL_CAPSULE | Freq: Every morning | ORAL | Status: DC
  Filled 2022-10-22 (×2): qty 1

## 2022-10-22 MED ORDER — HEPARIN (PORCINE) IN NACL 1000-0.9 UT/500ML-% IV SOLN
INTRAVENOUS | Status: DC | PRN
  Administered 2022-10-22 (×2): 500 mL

## 2022-10-22 MED ORDER — MORPHINE SULFATE (PF) 2 MG/ML IV SOLN
2.0000 mg | Freq: Once | INTRAVENOUS | Status: AC
  Administered 2022-10-22: 2 mg via INTRAVENOUS
  Filled 2022-10-22: qty 1

## 2022-10-22 MED ORDER — FENTANYL CITRATE (PF) 100 MCG/2ML IJ SOLN
INTRAMUSCULAR | Status: DC | PRN
  Administered 2022-10-22: 25 ug via INTRAVENOUS

## 2022-10-22 MED ORDER — HYDROCHLOROTHIAZIDE 12.5 MG PO TABS
12.5000 mg | ORAL_TABLET | Freq: Every day | ORAL | Status: DC
  Filled 2022-10-22: qty 1

## 2022-10-22 MED ORDER — ATROPINE SULFATE 1 MG/10ML IJ SOSY
PREFILLED_SYRINGE | INTRAMUSCULAR | Status: AC
  Filled 2022-10-22: qty 10

## 2022-10-22 MED ORDER — ONDANSETRON HCL 4 MG/2ML IJ SOLN: 4.0000 mg | Freq: Four times a day (QID) | INTRAMUSCULAR | Status: DC | PRN

## 2022-10-22 MED ORDER — SODIUM CHLORIDE 0.9 % IV SOLN: INTRAVENOUS | Status: DC

## 2022-10-22 MED ORDER — DONEPEZIL HCL 5 MG PO TABS
5.0000 mg | ORAL_TABLET | Freq: Every day | ORAL | Status: DC
  Administered 2022-10-22: 5 mg via ORAL
  Filled 2022-10-22: qty 1

## 2022-10-22 MED ORDER — ASPIRIN 81 MG PO CHEW
81.0000 mg | CHEWABLE_TABLET | Freq: Every day | ORAL | Status: DC
  Administered 2022-10-23: 81 mg via ORAL
  Filled 2022-10-22: qty 1

## 2022-10-22 MED ORDER — SODIUM CHLORIDE 0.9 % IV SOLN: INTRAVENOUS | Status: AC

## 2022-10-22 MED ORDER — EMPAGLIFLOZIN 10 MG PO TABS
10.0000 mg | ORAL_TABLET | Freq: Every day | ORAL | Status: DC
  Administered 2022-10-23: 10 mg via ORAL
  Filled 2022-10-22: qty 1

## 2022-10-22 MED ORDER — METHOTREXATE 2.5 MG PO TABS: 12.5000 mg | ORAL_TABLET | ORAL | Status: DC

## 2022-10-22 MED ORDER — FENTANYL CITRATE (PF) 100 MCG/2ML IJ SOLN
INTRAMUSCULAR | Status: AC
  Filled 2022-10-22: qty 2

## 2022-10-22 SURGICAL SUPPLY — 20 items
BALL SAPPHIRE NC24 3.0X12 (BALLOONS) ×1
BALLN EMERGE MR 2.5X15 (BALLOONS) ×1
BALLOON EMERGE MR 2.5X15 (BALLOONS) IMPLANT
BALLOON SAPPHIRE NC24 3.0X12 (BALLOONS) IMPLANT
CATH INFINITI 5FR AL1 (CATHETERS) IMPLANT
CATH INFINITI 5FR JL4 (CATHETERS) IMPLANT
CATH INFINITI 5FR JL5 (CATHETERS) IMPLANT
CATH INFINITI JR4 5F (CATHETERS) IMPLANT
CATH LAUNCHER 6FR AL1 (CATHETERS) IMPLANT
CATHETER LAUNCHER 6FR AL1 (CATHETERS) ×1
KIT HEART LEFT (KITS) ×1 IMPLANT
KIT MICROPUNCTURE NIT STIFF (SHEATH) IMPLANT
PACK CARDIAC CATHETERIZATION (CUSTOM PROCEDURE TRAY) ×1 IMPLANT
SHEATH PINNACLE 6F 10CM (SHEATH) IMPLANT
SHEATH PROBE COVER 6X72 (BAG) IMPLANT
TRANSDUCER W/STOPCOCK (MISCELLANEOUS) ×1 IMPLANT
TUBING CIL FLEX 10 FLL-RA (TUBING) ×1 IMPLANT
VALVE GUARDIAN II ~~LOC~~ HEMO (MISCELLANEOUS) IMPLANT
WIRE EMERALD 3MM-J .035X150CM (WIRE) IMPLANT
WIRE RUNTHROUGH IZANAI 014 180 (WIRE) IMPLANT

## 2022-10-22 NOTE — ED Provider Notes (Signed)
[facility] Ford EMERGENCY DEPARTMENT AT [facility] Provider Note   CSN: [id number] Arrival date & time: 10/22/22  0750     History  Chief Complaint  Patient presents with   Chest Pain    Shane Ford is a 77 y.o. male.  The history is provided by the patient, the spouse, the EMS personnel and medical records. No language interpreter was used.  Chest Pain Pain location:  L chest and substernal area Pain quality: crushing, pressure and radiating   Pain quality: not aching   Pain radiates to:  L shoulder and L arm Pain severity:  Severe Onset quality:  Sudden Timing:  Constant Progression:  Waxing and waning Chronicity:  Recurrent Context: not trauma   Relieved by:  Nitroglycerin Worsened by:  Nothing Ineffective treatments:  None tried Associated symptoms: diaphoresis, nausea and shortness of breath   Associated symptoms: no abdominal pain, no altered mental status, no anxiety, no back pain, no cough, no fatigue, no fever, no headache, no lower extremity edema, no near-syncope, no palpitations, no syncope, no vomiting and no weakness   Risk factors: coronary artery disease, diabetes mellitus, hypertension and male sex        Home Medications Prior to Admission medications   Medication Sig Start Date End Date Taking? Authorizing Provider  acetaminophen (TYLENOL) 500 MG tablet Take 1,000 mg by mouth every 6 (six) hours as needed for moderate pain.    [provider]  aspirin EC 81 MG tablet Take 81 mg by mouth in the morning. Swallow whole.    [provider]  Cholecalciferol 50 MCG (2000 UT) CAPS Take 2,000 Units by mouth in the morning.    [provider]  docusate sodium (COLACE) 100 MG capsule Take 200 mg by mouth 2 (two) times daily.    [provider]  donepezil (ARICEPT) 5 MG tablet Take 5 mg by mouth at bedtime.    [provider]  empagliflozin (JARDIANCE) 10 MG TABS tablet Take 1 tablet (10 mg total) by  mouth daily before breakfast. 01/23/21   Barrett, Erin R, PA-C  fenofibrate 160 MG tablet Take 160 mg by mouth in the morning.    [provider]  folic acid (FOLVITE) 800 MCG tablet Take 800 mcg by mouth daily.    [provider]  glimepiride (AMARYL) 1 MG tablet Take 1 mg by mouth in the morning and at bedtime.    [provider]  hydrochlorothiazide (MICROZIDE) 12.5 MG capsule Take 1 capsule (12.5 mg total) by mouth daily. 01/17/22   Lyn Records, MD  metFORMIN (GLUCOPHAGE) 500 MG tablet Take 500 mg by mouth 2 (two) times daily with a meal.    [provider]  methotrexate (RHEUMATREX) 2.5 MG tablet Take 12.5 mg by mouth every Wednesday. Take 6 tablets (12.5mg ) once a week on Wednesdays. Caution:Chemotherapy. Protect from light.    [provider]  naproxen sodium (ALEVE) 220 MG tablet Take 220 mg by mouth at bedtime.    [provider]  polyvinyl alcohol (LIQUIFILM TEARS) 1.4 % ophthalmic solution Place 1 drop into both eyes as needed for dry eyes.    [provider]  simvastatin (ZOCOR) 40 MG tablet Take 40 mg by mouth in the morning.    [provider]  venlafaxine XR (EFFEXOR-XR) 37.5 MG 24 hr capsule Take 37.5 mg by mouth in the morning.    [provider]  vitamin B-12 (CYANOCOBALAMIN) 1000 MCG tablet Take 1,000 mcg by mouth  daily.    [provider]      Allergies    Patient has no known allergies.    Review of Systems   Review of Systems  Constitutional:  Positive for diaphoresis. Negative for chills, fatigue and fever.  HENT:  Negative for congestion.   Eyes:  Negative for visual disturbance.  Respiratory:  Positive for chest tightness and shortness of breath. Negative for cough and wheezing.   Cardiovascular:  Positive for chest pain. Negative for palpitations, leg swelling, syncope and near-syncope.  Gastrointestinal:  Positive for nausea. Negative for abdominal pain, constipation,  diarrhea and vomiting.  Genitourinary:  Negative for flank pain.  Musculoskeletal:  Negative for back pain and neck pain.  Neurological:  Negative for weakness and headaches.  Psychiatric/Behavioral:  Negative for agitation and confusion.   All other systems reviewed and are negative.   Physical Exam Updated Vital Signs BP (!) 104/57   Pulse 65   Resp 18   Ht 6\' 3"  (1.905 m)   Wt 81.6 kg   SpO2 99%   BMI 22.50 kg/m  Physical Exam Vitals and nursing note reviewed.  Constitutional:      General: He is not in acute distress.    Appearance: He is well-developed. He is ill-appearing and diaphoretic. He is not toxic-appearing.  HENT:     Head: Normocephalic and atraumatic.  Eyes:     Conjunctiva/sclera: Conjunctivae normal.     Pupils: Pupils are equal, round, and reactive to light.  Cardiovascular:     Rate and Rhythm: Normal rate and regular rhythm.     Heart sounds: Normal heart sounds.  Pulmonary:     Effort: Pulmonary effort is normal. No respiratory distress.     Breath sounds: Normal breath sounds. No decreased breath sounds, wheezing, rhonchi or rales.  Chest:     Chest wall: No tenderness.  Abdominal:     Palpations: Abdomen is soft.     Tenderness: There is no abdominal tenderness.  Musculoskeletal:        General: No swelling.     Cervical back: Neck supple.  Skin:    General: Skin is warm.     Capillary Refill: Capillary refill takes less than 2 seconds.  Neurological:     Mental Status: He is alert.  Psychiatric:        Mood and Affect: Mood normal.     ED Results / Procedures / Treatments   Labs (all labs ordered are listed, but only abnormal results are displayed) Labs Reviewed  CBG MONITORING, ED - Abnormal; Notable for the following components:      Result Value   Glucose-Capillary 154 (*)    All other components within normal limits  HEMOGLOBIN A1C  CBC WITH DIFFERENTIAL/PLATELET  PROTIME-INR  APTT  COMPREHENSIVE METABOLIC PANEL  LIPID  PANEL  TROPONIN I (HIGH SENSITIVITY)    EKG EKG Interpretation  Date/Time:  Saturday October 22 2022 07:56:23 EDT Ventricular Rate:  60 PR Interval:  199 QRS Duration: 137 QT Interval:  495 QTC Calculation: 495 R Axis:   -10 Text Interpretation: Sinus rhythm Atrial premature complex Left bundle branch block when comapred to prior, concern for subtle ST elevation in leads 2,3,AVF paced. concenr for stemi Reconfirmed by Theda Belfast ([id number]) on 10/22/2022 8:25:22 AM  Radiology No results found.  Procedures Procedures    CRITICAL CARE Performed by: Canary Brim Jon Lall Total critical care time: 40 minutes Critical care time was exclusive of separately billable procedures and treating other patients.  Critical care was necessary to treat or prevent imminent or life-threatening deterioration. Critical care was time spent personally by me on the following activities: development of treatment plan with patient and/or surrogate as well as nursing, discussions with consultants, evaluation of patient's response to treatment, examination of patient, obtaining history from patient or surrogate, ordering and performing treatments and interventions, ordering and review of laboratory studies, ordering and review of radiographic studies, pulse oximetry and re-evaluation of patient's condition.   Medications Ordered in ED Medications  sodium chloride 0.9 % bolus 1,000 mL (has no administration in time range)  0.9 %  sodium chloride infusion (has no administration in time range)  heparin injection 4,000 Units (has no administration in time range)  sodium chloride 0.9 % bolus 1,000 mL (has no administration in time range)    ED Course/ Medical Decision Making/ A&P                             Medical Decision Making Amount and/or Complexity of Data Reviewed Labs: ordered. Radiology: ordered.  Risk Prescription drug management. Decision regarding hospitalization.    Amory Simonetti  is a 77 y.o. male with a past medical history significant for CAD status post CABG, hypertension, hyperlipidemia, diabetes, previous heart block with pacemaker, and depression who presents as a code STEMI.  According to patient, he has been feeling well at his baseline until this morning when he woke up and was onset chest pain going into his left arm with associated diaphoresis, nausea, shortness of breath and lightheadedness.  Patient was given aspirin.  EKG with EMS was concerning for possible STEMI in the setting of artifact and pacing and so a code STEMI was activated.  And route, STEMI was deactivated by cardiology based on EKG so patient was brought to exam room for evaluation and reassessment.  Patient reports that he had no recent travel, history of blood clots, and was at his baseline till this chest discomfort started this morning.  He says it feels similar but much worse than his previous MI.  He reports that in his central chest going into his left arm with tingling.  He reports he is clammy diaphoretic and short of breath.  He is lightheaded.  He describes the pain is up to 10 out of 10 in severity initially but after the nitro it is now an 8 out of 10.  He reports the pain is coming back.  He denies any leg pain or leg swelling, denies recent trauma, denies other infectious complaints.  Patient appears very ill on initial evaluation.  Lungs are clear and chest is nontender.  Abdomen nontender.  He does have good pulses in extremities and does not have significant edema but patient is diaphoretic.  EKG was performed and is concerning for subtle STEMI with elevations and depressions present in a paced rhythm.  I spoke to Dr. Eldridge Dace with cardiology who will come see patient and agrees with reactivation of a code STEMI given the patient's ill appearance.   They recommended loading with heparin and they will activate STEMI.  Patient's pressure continues to deteriorate into the 90s.  Will give  more fluids and get a posterior EKG as well.   Patient taken to Cath Lab for further evaluation and management by cardiology.          Final Clinical Impression(s) / ED Diagnoses Final diagnoses:  ST elevation myocardial infarction (STEMI), unspecified artery (HCC)  Clinical Impression: 1. ST elevation myocardial infarction (STEMI), unspecified artery (HCC)     Disposition: Admit  This note was prepared with assistance of Dragon voice recognition software. Occasional wrong-word or sound-a-like substitutions may have occurred due to the inherent limitations of voice recognition software.     Jatavia Keltner, Canary Brim, MD 10/22/22 1351

## 2022-10-22 NOTE — ED Notes (Signed)
Code Stemi reactivated per verbal order of Dr Rush Landmark

## 2022-10-22 NOTE — Progress Notes (Signed)
ACT:169  Right fem sheath pulled at 1352 with manual pressure held for 24 minutes. Hemostasis achieved, vital signs stable. Femoral pulse palpable before and after +2. Level 0 at Right femoral site.

## 2022-10-22 NOTE — ED Notes (Signed)
Pt to cath lab.

## 2022-10-22 NOTE — ED Notes (Signed)
Cardiology at bedside.

## 2022-10-22 NOTE — ED Triage Notes (Signed)
Pt awoke this am with chest pain pt has hx of pacemaker bipass and mutiple stents

## 2022-10-22 NOTE — H&P (Addendum)
Cardiology Admission History and Physical   Patient ID: Shane Ford MRN: [id number]; DOB: 09-Oct-1945   Admission date: 10/22/2022  PCP:  Danella Penton, MD   [facility] Providers Cardiologist:  Lesleigh Noe, MD (Inactive)        Chief Complaint:  chest pain  Patient Profile:   Shane Ford is a 77 y.o. male with CAD who is being seen 10/22/2022 for the evaluation of chest pain.  History of Present Illness:   Shane Ford had CABG in 2022. CABG X 4, LIMA LAD, reverse saphenous vein graft to posterior lateral branch, OM1, first diagonal. Endoscopic greater saphenous vein harvest on the right.  Chest pain started early this morning.  Pressure in the center of his chest.  Worse than any pain he has had in the past.  It has improved a little.  Pain is down to a 5 out of 10.  EMS and ER physician felt he was cold and clammy and pale.  When I saw him in the emergency room, his pain did come down to about a 5 out of 10.  The son pulled me aside and mention that the patient will occasionally have anxiety attacks.  His anxiety medicine had been stopped and then recently restarted.   Past Medical History:  Diagnosis Date   Arthritis    pt states he was diagnosed 10+ years ago; psoriatic arthritis   Coronary artery disease    Depression    pt diagnosed 10+ years ago   Diabetes mellitus (HCC)    Dysrhythmia 01/17/2021   first degree AV block, 2nd degree AVB, Mobitz I in setting of MI   Hyperlipidemia    Hypertension     Past Surgical History:  Procedure Laterality Date   ANTERIOR CERVICAL DECOMP/DISCECTOMY FUSION N/A 05/12/2021   Procedure: ANTERIOR CERVICAL DECOMPRESSION FUSION CERVICAL 5- CERVICAL 6 WITH INSTRUMENTATION AND ALLOGRAFT;  Surgeon: Estill Bamberg, MD;  Location: [facility] OR;  Service: Orthopedics;  Laterality: N/A;   CARDIAC CATHETERIZATION     CORONARY ARTERY BYPASS GRAFT N/A 01/19/2021   Procedure: CORONARY ARTERY BYPASS GRAFTING (CABG) TIMES  FOUR, ON PUMP, USING LEFT INTERNAL MAMMARY ARTERY AND ENDOSCOPICALLY HARVESTED GREATER SAPHENOUS VEIN;  Surgeon: Corliss Skains, MD;  Location: [facility] OR;  Service: Open Heart Surgery;  Laterality: N/A;  flow trac   ENDOVEIN HARVEST OF GREATER SAPHENOUS VEIN Right 01/19/2021   Procedure: ENDOVEIN HARVEST OF GREATER SAPHENOUS VEIN;  Surgeon: Corliss Skains, MD;  Location: [facility] OR;  Service: Open Heart Surgery;  Laterality: Right;   EYE SURGERY Bilateral 2021   cataracts replaced in both eyes   LEFT HEART CATH AND CORONARY ANGIOGRAPHY N/A 01/17/2021   Procedure: LEFT HEART CATH AND CORONARY ANGIOGRAPHY;  Surgeon: Lyn Records, MD;  Location: [facility] INVASIVE CV LAB;  Service: Cardiovascular;  Laterality: N/A;   PACEMAKER IMPLANT N/A 07/28/2021   Procedure: PACEMAKER IMPLANT;  Surgeon: Marinus Maw, MD;  Location: [facility] INVASIVE CV LAB;  Service: Cardiovascular;  Laterality: N/A;   TEE WITHOUT CARDIOVERSION N/A 01/19/2021   Procedure: TRANSESOPHAGEAL ECHOCARDIOGRAM (TEE);  Surgeon: Corliss Skains, MD;  Location: [facility] OR;  Service: Open Heart Surgery;  Laterality: N/A;     Medications Prior to Admission: Prior to Admission medications   Medication Sig Start Date End Date Taking? Authorizing Provider  acetaminophen (TYLENOL) 500 MG tablet Take 1,000 mg by mouth every 6 (six) hours as needed for moderate pain.    [provider]  aspirin  EC 81 MG tablet Take 81 mg by mouth in the morning. Swallow whole.    [provider]  Cholecalciferol 50 MCG (2000 UT) CAPS Take 2,000 Units by mouth in the morning.    [provider]  docusate sodium (COLACE) 100 MG capsule Take 200 mg by mouth 2 (two) times daily.    [provider]  donepezil (ARICEPT) 5 MG tablet Take 5 mg by mouth at bedtime.    [provider]  empagliflozin (JARDIANCE) 10 MG TABS tablet Take 1 tablet (10 mg total) by mouth daily before breakfast. 01/23/21   Barrett, Erin R, PA-C  fenofibrate  160 MG tablet Take 160 mg by mouth in the morning.    [provider]  folic acid (FOLVITE) 800 MCG tablet Take 800 mcg by mouth daily.    [provider]  glimepiride (AMARYL) 1 MG tablet Take 1 mg by mouth in the morning and at bedtime.    [provider]  hydrochlorothiazide (MICROZIDE) 12.5 MG capsule Take 1 capsule (12.5 mg total) by mouth daily. 01/17/22   Lyn Records, MD  metFORMIN (GLUCOPHAGE) 500 MG tablet Take 500 mg by mouth 2 (two) times daily with a meal.    [provider]  methotrexate (RHEUMATREX) 2.5 MG tablet Take 12.5 mg by mouth every Wednesday. Take 6 tablets (12.5mg ) once a week on Wednesdays. Caution:Chemotherapy. Protect from light.    [provider]  naproxen sodium (ALEVE) 220 MG tablet Take 220 mg by mouth at bedtime.    [provider]  polyvinyl alcohol (LIQUIFILM TEARS) 1.4 % ophthalmic solution Place 1 drop into both eyes as needed for dry eyes.    [provider]  simvastatin (ZOCOR) 40 MG tablet Take 40 mg by mouth in the morning.    [provider]  venlafaxine XR (EFFEXOR-XR) 37.5 MG 24 hr capsule Take 37.5 mg by mouth in the morning.    [provider]  vitamin B-12 (CYANOCOBALAMIN) 1000 MCG tablet Take 1,000 mcg by mouth daily.    [provider]     Allergies:   No Known Allergies  Social History:   Social History   Socioeconomic History   Marital status: Married    Spouse name: Not on file   Number of children: 2   Years of education: 14   Highest education level: Not on file  Occupational History   Occupation: Retired  Tobacco Use   Smoking status: Never   Smokeless tobacco: Never  Vaping Use   Vaping Use: Never used  Substance and Sexual Activity   Alcohol use: Never   Drug use: Never   Sexual activity: Not on file  Other Topics Concern   Not on file  Social History Narrative   Not on file   Social Determinants of Health   Financial Resource  Strain: Not on file  Food Insecurity: Not on file  Transportation Needs: Not on file  Physical Activity: Not on file  Stress: Not on file  Social Connections: Not on file  Intimate Partner Violence: Not on file    Family History: Noncontributory The patient's family history is not on file.    ROS:  Please see the history of present illness.  Chest discomfort all other ROS reviewed and negative.     Physical Exam/Data:   Vitals:   10/22/22 0758 10/22/22 0800 10/22/22 0815 10/22/22 0822  BP: (!) 104/57 (!) 104/58 99/74   Pulse: 65 60 60   Resp: 18  13 14   Temp:    98.3 F (36.8 C)  SpO2: 99% 99% 99%   Weight:  81.6 kg    Height:  6\' 3"  (1.905 m)     No intake or output data in the 24 hours ending 10/22/22 1037    10/22/2022    8:00 AM 01/17/2022    2:57 PM 11/01/2021    1:53 PM  Last 3 Weights  Weight (lbs) 180 lb 182 lb 12.8 oz 188 lb 3.2 oz  Weight (kg) 81.647 kg 82.918 kg 85.367 kg     Body mass index is 22.5 kg/m.  General:  Well nourished, well developed, in no acute distress HEENT: normal Neck: no JVD Vascular: No carotid bruits; Distal pulses 2+ bilaterally   Cardiac:  normal S1, S2; RRR; no murmur  Lungs:  clear to auscultation bilaterally, no wheezing, rhonchi or rales  Abd: soft, nontender, no hepatomegaly  Ext: no edema; 2+ Musculoskeletal:  No deformities, BUE and BLE strength normal and equal Skin: warm and dry  Neuro:  CNs 2-12 intact, no focal abnormalities noted Psych:  Normal affect    EKG:  The ECG that was done NSR was personally reviewed and demonstrates NSR, subtle inferior ST changes  Relevant CV Studies: Cath reviewed  Laboratory Data:  High Sensitivity Troponin:   Recent Labs  Lab 10/22/22 0800  TROPONINIHS 9      Chemistry Recent Labs  Lab 10/22/22 0800  NA 140  K 3.5  CL 108  CO2 23  GLUCOSE 166*  BUN 18  CREATININE 1.14  CALCIUM 8.7*  GFRNONAA >60  ANIONGAP 9    Recent Labs  Lab 10/22/22 0800  PROT 5.6*   ALBUMIN 3.6  AST 27  ALT 18  ALKPHOS 29*  BILITOT 1.1   Lipids  Recent Labs  Lab 10/22/22 0800  CHOL 132  TRIG 87  HDL 48  LDLCALC 67  CHOLHDL 2.8   Hematology Recent Labs  Lab 10/22/22 0800  WBC 6.8  RBC 4.67  HGB 13.9  HCT 42.1  MCV 90.1  MCH 29.8  MCHC 33.0  RDW 14.5  PLT 197   Thyroid No results for input(s): "TSH", "FREET4" in the last 168 hours. BNPNo results for input(s): "BNP", "PROBNP" in the last 168 hours.  DDimer No results for input(s): "DDIMER" in the last 168 hours.   Radiology/Studies:  CARDIAC CATHETERIZATION  Addendum Date: 10/22/2022     Mid LM to Dist LM lesion is 60% stenosed.  Ost LAD to Prox LAD lesion is 85% stenosed.  Patent LIMA to LAD.   1st Diag lesion is 75% stenosed.  Small vessel with TIMI 3 flow.   2nd Diag lesion is 80% stenosed.  SVG to Diagonal is occluded.  Native vessel is small with TIMI 3 flow.   RPDA lesion is 90% stenosed.  Small tortuous vessel.  SVG to PDA is occluded.   1st RPL lesion is 100% stenosed.  RPAV lesion is 95% stenosed.  Small, tortuous branch vessels in the distal RCA territory.   Ost Cx to Prox Cx lesion is 100% stenosed.  SVG to OM is widely patent.   Mid RCA to Dist RCA lesion is 100% stenosed.  Balloon angioplasty was performed using a 2.5 semicompliant balloon followed by a BALL SAPPHIRE NC24 3.0X12.   Post intervention, there is a 0% residual stenosis. 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  Surprising that the ECG changes were not more  pronounced given the large territory involved.  The distal RCA stent occlusion was treated with balloon angioplasty.  Continue dual antiplatelet therapy with Brilinta for 12 months.  After that time, would plan for long-term clopidogrel therapy to help reduce his risk of recurrent stent thrombosis.  Continue medical therapy for his other small vessel disease. Results conveyed to the family. Plan for hospital discharge tomorrow,  10/23/2022.  Result Date: 10/22/2022   Mid LM to Dist LM lesion is 60% stenosed.  Ost LAD to Prox LAD lesion is 85% stenosed.  Patent LIMA to LAD.   1st Diag lesion is 75% stenosed.  Small vessel with TIMI 3 flow.   2nd Diag lesion is 80% stenosed.  SVG to Diagonal is occluded.  Native vessel is small with TIMI 3 flow.   RPDA lesion is 90% stenosed.  Small tortuous vessel.  SVG to PDA is occluded.   1st RPL lesion is 100% stenosed.  RPAV lesion is 95% stenosed.  Small, tortuous branch vessels in the distal RCA territory.   Ost Cx to Prox Cx lesion is 100% stenosed.  SVG to OM is widely patent.   Mid RCA to Dist RCA lesion is 100% stenosed.  Balloon angioplasty was performed using a 2.5 semicompliant balloon followed by a BALL SAPPHIRE NC24 3.0X12.   Post intervention, there is a 0% residual stenosis. 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  Surprising that the ECG changes were not more pronounced given the large territory involved.  The distal RCA stent occlusion was treated with balloon angioplasty.  Continue dual antiplatelet therapy with Brilinta for 12 months.  After that time, would plan for long-term clopidogrel therapy to help reduce his risk of recurrent stent thrombosis.  Continue medical therapy for his other small vessel disease. Results conveyed to the family.   DG Chest Port 1 View  Result Date: 10/22/2022 CLINICAL DATA:  STEMI.  Patient woke with chest pain today. EXAM: PORTABLE CHEST 1 VIEW COMPARISON:  Two-view chest x-ray 07/28/2021 FINDINGS: Heart size is normal. Changes of CABG again noted. Pacing wires are stable. Lung volumes are low. No edema or effusion is present. No focal airspace disease present. The visualized soft tissues and bony thorax are unremarkable. IMPRESSION: No active disease. Electronically Signed   By: Marin Roberts M.D.   On: 10/22/2022 08:40     Assessment and Plan:   Chest pain: Concerning for cardiac origin.  Multiple  ECGs reviewed.  I reviewed his prior catheterization and echo findings.  Subtle ST changes that do not meet STEMI criteria on the ECG done in the emergency room.  Due to ongoing chest pain, will plan for emergent catheterization.  Upon seeing the patient in the ER, his pain had improved but had not resolved.  Therefore, I made the decision for emergency cardiac catheterization.  Plan discussed with family and with Dr. Rush Landmark. Pacer: appears to be functioning well.  Catheter results showed: "Mid LM to Dist LM lesion is 60% stenosed.  Ost LAD to Prox LAD lesion is 85% stenosed.  Patent LIMA to LAD.   1st Diag lesion is 75% stenosed.  Small vessel with TIMI 3 flow.   2nd Diag lesion is 80% stenosed.  SVG to Diagonal is occluded.  Native vessel is small with TIMI 3 flow.   RPDA lesion is 90% stenosed.  Small tortuous vessel.  SVG to PDA is occluded.   1st RPL lesion is 100% stenosed.  RPAV lesion is  95% stenosed.  Small, tortuous branch vessels in the distal RCA territory.   Ost Cx to Prox Cx lesion is 100% stenosed.  SVG to OM is widely patent.   Mid RCA to Dist RCA lesion is 100% stenosed.  Balloon angioplasty was performed using a 2.5 semicompliant balloon followed by a BALL SAPPHIRE NC24 3.0X12.   Post intervention, there is a 0% residual stenosis.   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  Surprising that the ECG changes were not more pronounced given the large territory involved.  The distal RCA stent occlusion was treated with balloon angioplasty.  Continue dual antiplatelet therapy with Brilinta for 12 months.  After that time, would plan for long-term clopidogrel therapy to help reduce his risk of recurrent stent thrombosis.  Continue medical therapy for his other small vessel disease.   Results conveyed to the family.   Plan for hospital discharge tomorrow, 10/23/2022."   Increase intensity of statin to atorvastatin 80 mg daily.  CRITICAL  CARE Performed by: Lance Muss   Total critical care time: 40 minutes  Critical care time was exclusive of separately billable procedures and treating other patients.  Critical care was necessary to treat or prevent imminent or life-threatening deterioration.  Critical care was time spent personally by me on the following activities: development of treatment plan with patient and/or surrogate as well as nursing, discussions with consultants, evaluation of patient's response to treatment, examination of patient, obtaining history from patient or surrogate, ordering and performing treatments and interventions, ordering and review of laboratory studies, ordering and review of radiographic studies, pulse oximetry and re-evaluation of patient's condition.   Risk Assessment/Risk Scores:    TIMI Risk Score for Unstable Angina or Non-ST Elevation MI:   The patient's TIMI risk score is 6, which indicates a 41% risk of all cause mortality, new or recurrent myocardial infarction or need for urgent revascularization in the next 14 days.       Severity of Illness: The appropriate patient status for this patient is INPATIENT. Inpatient status is judged to be reasonable and necessary in order to provide the required intensity of service to ensure the patient's safety. The patient's presenting symptoms, physical exam findings, and initial radiographic and laboratory data in the context of their chronic comorbidities is felt to place them at high risk for further clinical deterioration. Furthermore, it is not anticipated that the patient will be medically stable for discharge from the hospital within 2 midnights of admission.   * I certify that at the point of admission it is my clinical judgment that the patient will require inpatient hospital care spanning beyond 2 midnights from the point of admission due to high intensity of service, high risk for further deterioration and high frequency of  surveillance required.*   For questions or updates, please contact [facility] Please consult [email or web address] for contact info under     Signed, Lance Muss, MD  10/22/2022 10:37 AM

## 2022-10-22 NOTE — Progress Notes (Signed)
   10/22/22 0815  Spiritual Encounters  Type of Visit Initial  Care provided to: Pt and family  Conversation partners present during encounter Nurse  Referral source Code page  Reason for visit Code   Responded to code STEMI. PT's wife and son in the room with him. It was decided to take PT to cath lab; chaplain escorted family to [facility] waiting area and gave them directions to where to get coffee an food.

## 2022-10-23 DIAGNOSIS — I2119 ST elevation (STEMI) myocardial infarction involving other coronary artery of inferior wall: Secondary | ICD-10-CM

## 2022-10-23 LAB — BASIC METABOLIC PANEL
Anion gap: 5 (ref 5–15)
BUN: 16 mg/dL (ref 8–23)
CO2: 26 mmol/L (ref 22–32)
Calcium: 8.6 mg/dL — ABNORMAL LOW (ref 8.9–10.3)
Chloride: 108 mmol/L (ref 98–111)
Creatinine, Ser: 1.11 mg/dL (ref 0.61–1.24)
GFR, Estimated: >60 mL/min (ref >60)
Glucose, Bld: 140 mg/dL — ABNORMAL HIGH (ref 70–99)
Potassium: 3.5 mmol/L (ref 3.5–5.1)
Sodium: 139 mmol/L (ref 135–145)

## 2022-10-23 LAB — CBC
HCT: 43 % (ref 39.0–52.0)
Hemoglobin: 13.8 g/dL (ref 13.0–17.0)
MCH: 29.7 pg (ref 26.0–34.0)
MCHC: 32.1 g/dL (ref 30.0–36.0)
MCV: 92.5 fL (ref 80.0–100.0)
Platelets: 203 10*3/uL (ref 150–400)
RBC: 4.65 MIL/uL (ref 4.22–5.81)
RDW: 14.6 % (ref 11.5–15.5)
WBC: 5.8 10*3/uL (ref 4.0–10.5)
nRBC: 0 % (ref 0.0–0.2)

## 2022-10-23 LAB — TROPONIN I (HIGH SENSITIVITY): Troponin I (High Sensitivity): 8912 ng/L (ref <18)

## 2022-10-23 LAB — GLUCOSE, CAPILLARY: Glucose-Capillary: 183 mg/dL — ABNORMAL HIGH (ref 70–99)

## 2022-10-23 MED ORDER — TICAGRELOR 90 MG PO TABS: 90.0000 mg | ORAL_TABLET | Freq: Two times a day (BID) | ORAL | 3 refills | Status: AC

## 2022-10-23 MED ORDER — METOPROLOL TARTRATE 25 MG PO TABS: 12.5000 mg | ORAL_TABLET | Freq: Two times a day (BID) | ORAL | 2 refills | Status: DC

## 2022-10-23 MED ORDER — POTASSIUM CHLORIDE CRYS ER 20 MEQ PO TBCR: 40.0000 meq | EXTENDED_RELEASE_TABLET | Freq: Once | ORAL | 0 refills | Status: DC

## 2022-10-23 MED ORDER — POTASSIUM CHLORIDE CRYS ER 20 MEQ PO TBCR
40.0000 meq | EXTENDED_RELEASE_TABLET | Freq: Once | ORAL | Status: AC
  Administered 2022-10-23: 40 meq via ORAL
  Filled 2022-10-23: qty 2

## 2022-10-23 MED ORDER — ATORVASTATIN CALCIUM 80 MG PO TABS: 80.0000 mg | ORAL_TABLET | Freq: Every day | ORAL | 3 refills | Status: DC

## 2022-10-23 MED ORDER — METOPROLOL TARTRATE 12.5 MG HALF TABLET
12.5000 mg | ORAL_TABLET | Freq: Two times a day (BID) | ORAL | Status: DC
  Administered 2022-10-23: 12.5 mg via ORAL
  Filled 2022-10-23: qty 1

## 2022-10-23 NOTE — Progress Notes (Signed)
Discharge instructions reviewed with patient and his wife. All questions answered. Pt verbalized understanding of instructions. PIV x 2 removed. Patient taken via wheelchair by Windell Hummingbird, NT and discharged to his wife.

## 2022-10-23 NOTE — Discharge Summary (Signed)
Discharge Summary    Patient ID: Shane Ford MRN: [id number]; DOB: 07/18/1945  Admit date: 10/22/2022 Discharge date: 10/23/2022  PCP:  Danella Penton, MD   [facility] Providers Cardiologist:  Lyn Records III, MD (Inactive)        Discharge Diagnoses    Principal Problem:   Acute inferior myocardial infarction [facility])    Diagnostic Studies/Procedures    10/22/22 LHC    Mid LM to Dist LM lesion is 60% stenosed.  Ost LAD to Prox LAD lesion is 85% stenosed.  Patent LIMA to LAD.   1st Diag lesion is 75% stenosed.  Small vessel with TIMI 3 flow.   2nd Diag lesion is 80% stenosed.  SVG to Diagonal is occluded.  Native vessel is small with TIMI 3 flow.   RPDA lesion is 90% stenosed.  Small tortuous vessel.  SVG to PDA is occluded.   1st RPL lesion is 100% stenosed.  RPAV lesion is 95% stenosed.  Small, tortuous branch vessels in the distal RCA territory.   Ost Cx to Prox Cx lesion is 100% stenosed.  SVG to OM is widely patent.   Mid RCA to Dist RCA lesion is 100% stenosed.  Balloon angioplasty was performed using a 2.5 semicompliant balloon followed by a BALL SAPPHIRE NC24 3.0X12.   Post intervention, there is a 0% residual stenosis.   The left ventricular systolic function is normal.   LV end diastolic pressure is normal.  LVEDP 12 mm Hg.   The left ventricular ejection fraction is 55-65% by visual estimate.   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  Surprising that the ECG changes were not more pronounced given the large territory involved.  The distal RCA stent occlusion was treated with balloon angioplasty.  Continue dual antiplatelet therapy with Brilinta for 12 months.  After that time, would plan for long-term clopidogrel therapy to help reduce his risk of recurrent stent thrombosis.  Continue medical therapy for his other small vessel disease.   Results conveyed to the family.   Plan for hospital discharge tomorrow,  10/23/2022.  Diagnostic Dominance: Right  Intervention    _____________   History of Present Illness     Shane Ford is a 77 y.o. male with medical history of hypertension, early dementia, primary hypertension, hyperlipidemia, diabetes mellitus type 2, ST elevation MI (related to occlusion of LV branch of RCA that was too far distal and too small to treat interventionally), four-vessel bypass with LIMA to LAD, SVG to PL, SVG to OM, SVG to diagonal 1 ([name])- 01/19/2021, PPM for AVB 07/28/2021, psoriatic arthritis.   Patient presented to the ED with EMS on 4/27 with symptoms of sudden onset chest pressure.   Hospital Course     Consultants: n/a   Chest pain CAD s/p CABG Hyperlipidemia  Patient brought to the ED with EMS due to sudden onset chest pressure as a possible STEMI. Found with subtle ST segment changes on ECG that did not meet STEMI criteria. However, given symptoms and hx, decision made to complete urgent LHC.   Catheter results showed: "Mid LM to Dist LM lesion is 60% stenosed.  Ost LAD to Prox LAD lesion is 85% stenosed.  Patent LIMA to LAD.   1st Diag lesion is 75% stenosed.  Small vessel with TIMI 3 flow.   2nd Diag lesion is 80% stenosed.  SVG to Diagonal is occluded.  Native vessel is small with TIMI 3  flow.   RPDA lesion is 90% stenosed.  Small tortuous vessel.  SVG to PDA is occluded.   1st RPL lesion is 100% stenosed.  RPAV lesion is 95% stenosed.  Small, tortuous branch vessels in the distal RCA territory.   Ost Cx to Prox Cx lesion is 100% stenosed.  SVG to OM is widely patent.   Mid RCA to Dist RCA lesion is 100% stenosed.  Balloon angioplasty was performed using a 2.5 semicompliant balloon followed by a BALL SAPPHIRE NC24 3.0X12.   Post intervention, there is a 0% residual stenosis.  Patient seen by Dr. Antoine Poche and deemed stable for discharge today. Discharge medications as below:  DAPT with ASA 81mg /Brilinta 90mg  BID Atorvastatin 80mg ,  Fenofibrate 160mg  Start Metoprolol Tartrate 12.5mg  BID.  Hypertension  BP at goal. Given low K and s/p NSTEMI, will switch from HCTZ to beta blocker, metoprolol tartrate 12.5mg  BID.   DM type II  Continue Jardiance 10mg , Glimepiride 1mg , Metformin (metformin not to be resumed until 4/30).   Psoriatic arthritis   Continue Methotrexate       Did the patient have an acute coronary syndrome (MI, NSTEMI, STEMI, etc) this admission?:  Yes                               AHA/ACC Clinical Performance & Quality Measures: Aspirin prescribed? - Yes ADP Receptor Inhibitor (Plavix/Clopidogrel, Brilinta/Ticagrelor or Effient/Prasugrel) prescribed (includes medically managed patients)? - Yes Beta Blocker prescribed? - Yes High Intensity Statin (Lipitor 40-80mg  or Crestor 20-40mg ) prescribed? - Yes EF assessed during THIS hospitalization? - Yes For EF <40%, was ACEI/ARB prescribed? - Not Applicable (EF >/= 40%) For EF <40%, Aldosterone Antagonist (Spironolactone or Eplerenone) prescribed? - Not Applicable (EF >/= 40%) Cardiac Rehab Phase II ordered (including medically managed patients)? - Yes       The patient will be scheduled for a TOC follow up appointment in the next 14 days.  A message has been sent to the [facility] and Scheduling Pool at the office where the patient should be seen for follow up.  _____________  Discharge Vitals Blood pressure 104/60, pulse 60, temperature 98.3 F (36.8 C), temperature source Oral, resp. rate 16, height 6\' 3"  (1.905 m), weight 82.6 kg, SpO2 99 %.  Filed Weights   10/22/22 0800 10/23/22 0510  Weight: 81.6 kg 82.6 kg    Labs & Radiologic Studies    CBC Recent Labs    10/22/22 0800 10/23/22 0015  WBC 6.8 5.8  NEUTROABS 2.8  --   HGB 13.9 13.8  HCT 42.1 43.0  MCV 90.1 92.5  PLT 197 203   Basic Metabolic Panel Recent Labs    40/98/11 0800 10/23/22 0015  NA 140 139  K 3.5 3.5  CL 108 108  CO2 23 26  GLUCOSE 166* 140*  BUN 18 16   CREATININE 1.14 1.11  CALCIUM 8.7* 8.6*   Liver Function Tests Recent Labs    10/22/22 0800  AST 27  ALT 18  ALKPHOS 29*  BILITOT 1.1  PROT 5.6*  ALBUMIN 3.6   No results for input(s): "LIPASE", "AMYLASE" in the last 72 hours. High Sensitivity Troponin:   Recent Labs  Lab 10/22/22 0800 10/22/22 1129 10/23/22 0540  TROPONINIHS 9 3,593* 8,912*    BNP Invalid input(s): "POCBNP" D-Dimer No results for input(s): "DDIMER" in the last 72 hours. Hemoglobin A1C Recent Labs    10/22/22 0800  HGBA1C 6.8*  Fasting Lipid Panel Recent Labs    10/22/22 0800  CHOL 132  HDL 48  LDLCALC 67  TRIG 87  CHOLHDL 2.8   Thyroid Function Tests No results for input(s): "TSH", "T4TOTAL", "T3FREE", "THYROIDAB" in the last 72 hours.  Invalid input(s): "FREET3" _____________  CARDIAC CATHETERIZATION  Addendum Date: 10/22/2022     Mid LM to Dist LM lesion is 60% stenosed.  Ost LAD to Prox LAD lesion is 85% stenosed.  Patent LIMA to LAD.   1st Diag lesion is 75% stenosed.  Small vessel with TIMI 3 flow.   2nd Diag lesion is 80% stenosed.  SVG to Diagonal is occluded.  Native vessel is small with TIMI 3 flow.   RPDA lesion is 90% stenosed.  Small tortuous vessel.  SVG to PDA is occluded.   1st RPL lesion is 100% stenosed.  RPAV lesion is 95% stenosed.  Small, tortuous branch vessels in the distal RCA territory.   Ost Cx to Prox Cx lesion is 100% stenosed.  SVG to OM is widely patent.   Mid RCA to Dist RCA lesion is 100% stenosed.  Balloon angioplasty was performed using a 2.5 semicompliant balloon followed by a BALL SAPPHIRE NC24 3.0X12.   Post intervention, there is a 0% residual stenosis.   The left ventricular systolic function is normal.   LV end diastolic pressure is normal.  LVEDP 12 mm Hg.   The left ventricular ejection fraction is 55-65% by visual estimate. 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  Surprising that the ECG changes were not more  pronounced given the large territory involved.  The distal RCA stent occlusion was treated with balloon angioplasty.  Continue dual antiplatelet therapy with Brilinta for 12 months.  After that time, would plan for long-term clopidogrel therapy to help reduce his risk of recurrent stent thrombosis.  Continue medical therapy for his other small vessel disease. Results conveyed to the family. Plan for hospital discharge tomorrow, 10/23/2022.  Addendum Date: 10/22/2022     Mid LM to Dist LM lesion is 60% stenosed.  Ost LAD to Prox LAD lesion is 85% stenosed.  Patent LIMA to LAD.   1st Diag lesion is 75% stenosed.  Small vessel with TIMI 3 flow.   2nd Diag lesion is 80% stenosed.  SVG to Diagonal is occluded.  Native vessel is small with TIMI 3 flow.   RPDA lesion is 90% stenosed.  Small tortuous vessel.  SVG to PDA is occluded.   1st RPL lesion is 100% stenosed.  RPAV lesion is 95% stenosed.  Small, tortuous branch vessels in the distal RCA territory.   Ost Cx to Prox Cx lesion is 100% stenosed.  SVG to OM is widely patent.   Mid RCA to Dist RCA lesion is 100% stenosed.  Balloon angioplasty was performed using a 2.5 semicompliant balloon followed by a BALL SAPPHIRE NC24 3.0X12.   Post intervention, there is a 0% residual stenosis. 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  Surprising that the ECG changes were not more pronounced given the large territory involved.  The distal RCA stent occlusion was treated with balloon angioplasty.  Continue dual antiplatelet therapy with Brilinta for 12 months.  After that time, would plan for long-term clopidogrel therapy to help reduce his risk of recurrent stent thrombosis.  Continue medical therapy for his other small vessel disease. Results conveyed to the family. Plan for hospital discharge tomorrow, 10/23/2022.  Result Date: 10/22/2022   Mid LM to Dist LM lesion is 60% stenosed.  Ost LAD to Prox LAD lesion is 85% stenosed.  Patent LIMA to  LAD.   1st Diag lesion is 75% stenosed.  Small vessel with TIMI 3 flow.   2nd Diag lesion is 80% stenosed.  SVG to Diagonal is occluded.  Native vessel is small with TIMI 3 flow.   RPDA lesion is 90% stenosed.  Small tortuous vessel.  SVG to PDA is occluded.   1st RPL lesion is 100% stenosed.  RPAV lesion is 95% stenosed.  Small, tortuous branch vessels in the distal RCA territory.   Ost Cx to Prox Cx lesion is 100% stenosed.  SVG to OM is widely patent.   Mid RCA to Dist RCA lesion is 100% stenosed.  Balloon angioplasty was performed using a 2.5 semicompliant balloon followed by a BALL SAPPHIRE NC24 3.0X12.   Post intervention, there is a 0% residual stenosis. 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  Surprising that the ECG changes were not more pronounced given the large territory involved.  The distal RCA stent occlusion was treated with balloon angioplasty.  Continue dual antiplatelet therapy with Brilinta for 12 months.  After that time, would plan for long-term clopidogrel therapy to help reduce his risk of recurrent stent thrombosis.  Continue medical therapy for his other small vessel disease. Results conveyed to the family.   DG Chest Port 1 View  Result Date: 10/22/2022 CLINICAL DATA:  STEMI.  Patient woke with chest pain today. EXAM: PORTABLE CHEST 1 VIEW COMPARISON:  Two-view chest x-ray 07/28/2021 FINDINGS: Heart size is normal. Changes of CABG again noted. Pacing wires are stable. Lung volumes are low. No edema or effusion is present. No focal airspace disease present. The visualized soft tissues and bony thorax are unremarkable. IMPRESSION: No active disease. Electronically Signed   By: Marin Roberts M.D.   On: 10/22/2022 08:40   Disposition   Pt seen by Dr. Antoine Poche today and is being discharged home today in good condition.  Follow-up Plans & Appointments     Discharge Instructions     AMB Referral to Cardiac Rehabilitation - Phase II    Complete by: As directed    Diagnosis:  Coronary Stents NSTEMI     After initial evaluation and assessments completed: Virtual Based Care may be provided alone or in conjunction with Phase 2 Cardiac Rehab based on patient barriers.: Yes   Intensive Cardiac Rehabilitation (ICR) [facility] location only OR Traditional Cardiac Rehabilitation (TCR) *If criteria for ICR are not met will enroll in TCR [facility] only): Yes        Discharge Medications   Allergies as of 10/23/2022   No Known Allergies      Medication List     STOP taking these medications    folic acid 1 MG tablet Commonly known as: FOLVITE   folic acid 800 MCG tablet Commonly known as: FOLVITE   hydrochlorothiazide 12.5 MG capsule Commonly known as: MICROZIDE   naproxen sodium 220 MG tablet Commonly known as: ALEVE   simvastatin 40 MG tablet Commonly known as: ZOCOR       TAKE these medications    acetaminophen 500 MG tablet Commonly known as: TYLENOL Take 1,000 mg by mouth every 6 (six) hours as needed for moderate pain.   aspirin EC 81 MG tablet Take 81 mg by mouth in the morning. Swallow whole.   atorvastatin 80 MG tablet  Commonly known as: LIPITOR Take 1 tablet (80 mg total) by mouth daily. Start taking on: October 24, 2022   Cholecalciferol 50 MCG (2000 UT) Caps Take 2,000 Units by mouth in the morning.   cyanocobalamin 1000 MCG tablet Commonly known as: VITAMIN B12 Take 1,000 mcg by mouth daily.   docusate sodium 100 MG capsule Commonly known as: COLACE Take 100 mg by mouth daily as needed for mild constipation.   donepezil 5 MG tablet Commonly known as: ARICEPT Take 5 mg by mouth at bedtime.   donepezil 10 MG tablet Commonly known as: ARICEPT Take 10 mg by mouth at bedtime.   empagliflozin 10 MG Tabs tablet Commonly known as: Jardiance Take 1 tablet (10 mg total) by mouth daily before breakfast.   fenofibrate 160 MG tablet Take 160 mg by mouth in the morning.   glimepiride 1 MG  tablet Commonly known as: AMARYL Take 1 mg by mouth in the morning and at bedtime.   metFORMIN 500 MG tablet Commonly known as: GLUCOPHAGE Take 500 mg by mouth 2 (two) times daily with a meal.   methotrexate 2.5 MG tablet Commonly known as: RHEUMATREX Take 12.5 mg by mouth every Wednesday. Take 6 tablets (12.5mg ) once a week on Wednesdays. Caution:Chemotherapy. Protect from light.   metoprolol tartrate 25 MG tablet Commonly known as: LOPRESSOR Take 0.5 tablets (12.5 mg total) by mouth 2 (two) times daily.   mirtazapine 15 MG tablet Commonly known as: REMERON Take 15 mg by mouth at bedtime.   potassium chloride SA 20 MEQ tablet Commonly known as: KLOR-CON M Take 2 tablets (40 mEq total) by mouth once for 1 dose. On 4/29 take 2 tablets ( total) once.   ticagrelor 90 MG Tabs tablet Commonly known as: BRILINTA Take 1 tablet (90 mg total) by mouth 2 (two) times daily.           Outstanding Labs/Studies     Duration of Discharge Encounter   Greater than 30 minutes including physician time.  Con Memos, PA-C 10/23/2022, 9:44 AM

## 2022-10-23 NOTE — Progress Notes (Signed)
Progress Note  Patient Name: Shane Ford [facility] Date of Encounter: 10/23/2022  Primary Cardiologist:   Donato Schultz MD   Subjective   Doing well.  No further chest pain.  No SOB.    Inpatient Medications    Scheduled Meds:  aspirin  81 mg Oral Daily   atorvastatin  80 mg Oral Daily   Chlorhexidine Gluconate Cloth  6 each Topical Daily   cholecalciferol  2,000 Units Oral Daily   cyanocobalamin  1,000 mcg Oral Daily   docusate sodium  200 mg Oral BID   donepezil  5 mg Oral QHS   empagliflozin  10 mg Oral QAC breakfast   fenofibrate  160 mg Oral q AM   folic acid  1 mg Oral Daily   glimepiride  1 mg Oral Q breakfast   hydrochlorothiazide  12.5 mg Oral Daily   [START ON 10/26/2022] methotrexate  12.5 mg Oral Q Wed   sodium chloride flush  3 mL Intravenous Q12H   ticagrelor  90 mg Oral BID   venlafaxine XR  37.5 mg Oral q AM   Continuous Infusions:  sodium chloride     sodium chloride     sodium chloride     PRN Meds: sodium chloride, acetaminophen, ondansetron (ZOFRAN) IV, mouth rinse, polyvinyl alcohol, sodium chloride flush   Vital Signs    Vitals:   10/23/22 0400 10/23/22 0500 10/23/22 0510 10/23/22 0700  BP: 115/66 102/67  113/74  Pulse: 65 60  64  Resp: 18 19  15   Temp: 97.6 F (36.4 C)     TempSrc: Oral     SpO2: 97% 96%  99%  Weight:   82.6 kg   Height:        Intake/Output Summary (Last 24 hours) at 10/23/2022 0818 Last data filed at 10/23/2022 0400 Gross per 24 hour  Intake 1633.28 ml  Output 1275 ml  Net 358.28 ml   Filed Weights   10/22/22 0800 10/23/22 0510  Weight: 81.6 kg 82.6 kg    Telemetry    AV pacing - Personally Reviewed  ECG    AV pacing 100% capture.  - Personally Reviewed  Physical Exam   GEN: No acute distress.   Neck: No  JVD Cardiac: RRR, no murmurs, rubs, or gallops.  Respiratory: Clear  to auscultation bilaterally. GI: Soft, nontender, non-distended  MS: No  edema; No deformity.  Right femoral without bleeding  or bruising Neuro:  Nonfocal  Psych: Normal affect   Labs    Chemistry Recent Labs  Lab 10/22/22 0800 10/23/22 0015  NA 140 139  K 3.5 3.5  CL 108 108  CO2 23 26  GLUCOSE 166* 140*  BUN 18 16  CREATININE 1.14 1.11  CALCIUM 8.7* 8.6*  PROT 5.6*  --   ALBUMIN 3.6  --   AST 27  --   ALT 18  --   ALKPHOS 29*  --   BILITOT 1.1  --   GFRNONAA >60 >60  ANIONGAP 9 5     Hematology Recent Labs  Lab 10/22/22 0800 10/23/22 0015  WBC 6.8 5.8  RBC 4.67 4.65  HGB 13.9 13.8  HCT 42.1 43.0  MCV 90.1 92.5  MCH 29.8 29.7  MCHC 33.0 32.1  RDW 14.5 14.6  PLT 197 203    Cardiac EnzymesNo results for input(s): "TROPONINI" in the last 168 hours. No results for input(s): "TROPIPOC" in the last 168 hours.   BNPNo results for input(s): "BNP", "PROBNP" in the  last 168 hours.   DDimer No results for input(s): "DDIMER" in the last 168 hours.   Radiology    CARDIAC CATHETERIZATION  Addendum Date: 10/22/2022     Mid LM to Dist LM lesion is 60% stenosed.  Ost LAD to Prox LAD lesion is 85% stenosed.  Patent LIMA to LAD.   1st Diag lesion is 75% stenosed.  Small vessel with TIMI 3 flow.   2nd Diag lesion is 80% stenosed.  SVG to Diagonal is occluded.  Native vessel is small with TIMI 3 flow.   RPDA lesion is 90% stenosed.  Small tortuous vessel.  SVG to PDA is occluded.   1st RPL lesion is 100% stenosed.  RPAV lesion is 95% stenosed.  Small, tortuous branch vessels in the distal RCA territory.   Ost Cx to Prox Cx lesion is 100% stenosed.  SVG to OM is widely patent.   Mid RCA to Dist RCA lesion is 100% stenosed.  Balloon angioplasty was performed using a 2.5 semicompliant balloon followed by a BALL SAPPHIRE NC24 3.0X12.   Post intervention, there is a 0% residual stenosis.   The left ventricular systolic function is normal.   LV end diastolic pressure is normal.  LVEDP 12 mm Hg.   The left ventricular ejection fraction is 55-65% by visual estimate. Very late stent thrombosis of a bare-metal  stent from 1999 in the distal RCA.  Surprising that the ECG changes were not more pronounced given the large territory involved.  The distal RCA stent occlusion was treated with balloon angioplasty.  Continue dual antiplatelet therapy with Brilinta for 12 months.  After that time, would plan for long-term clopidogrel therapy to help reduce his risk of recurrent stent thrombosis.  Continue medical therapy for his other small vessel disease. Results conveyed to the family. Plan for hospital discharge tomorrow, 10/23/2022.  Addendum Date: 10/22/2022     Mid LM to Dist LM lesion is 60% stenosed.  Ost LAD to Prox LAD lesion is 85% stenosed.  Patent LIMA to LAD.   1st Diag lesion is 75% stenosed.  Small vessel with TIMI 3 flow.   2nd Diag lesion is 80% stenosed.  SVG to Diagonal is occluded.  Native vessel is small with TIMI 3 flow.   RPDA lesion is 90% stenosed.  Small tortuous vessel.  SVG to PDA is occluded.   1st RPL lesion is 100% stenosed.  RPAV lesion is 95% stenosed.  Small, tortuous branch vessels in the distal RCA territory.   Ost Cx to Prox Cx lesion is 100% stenosed.  SVG to OM is widely patent.   Mid RCA to Dist RCA lesion is 100% stenosed.  Balloon angioplasty was performed using a 2.5 semicompliant balloon followed by a BALL SAPPHIRE NC24 3.0X12.   Post intervention, there is a 0% residual stenosis. 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  Surprising that the ECG changes were not more pronounced given the large territory involved.  The distal RCA stent occlusion was treated with balloon angioplasty.  Continue dual antiplatelet therapy with Brilinta for 12 months.  After that time, would plan for long-term clopidogrel therapy to help reduce his risk of recurrent stent thrombosis.  Continue medical therapy for his other small vessel disease. Results conveyed to the family. Plan for hospital discharge tomorrow, 10/23/2022.  Result Date: 10/22/2022   Mid LM to Dist LM  lesion is 60% stenosed.  Ost LAD to Prox LAD lesion is 85% stenosed.  Patent LIMA to LAD.   1st Diag lesion is 75% stenosed.  Small vessel with TIMI 3 flow.   2nd Diag lesion is 80% stenosed.  SVG to Diagonal is occluded.  Native vessel is small with TIMI 3 flow.   RPDA lesion is 90% stenosed.  Small tortuous vessel.  SVG to PDA is occluded.   1st RPL lesion is 100% stenosed.  RPAV lesion is 95% stenosed.  Small, tortuous branch vessels in the distal RCA territory.   Ost Cx to Prox Cx lesion is 100% stenosed.  SVG to OM is widely patent.   Mid RCA to Dist RCA lesion is 100% stenosed.  Balloon angioplasty was performed using a 2.5 semicompliant balloon followed by a BALL SAPPHIRE NC24 3.0X12.   Post intervention, there is a 0% residual stenosis. 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  Surprising that the ECG changes were not more pronounced given the large territory involved.  The distal RCA stent occlusion was treated with balloon angioplasty.  Continue dual antiplatelet therapy with Brilinta for 12 months.  After that time, would plan for long-term clopidogrel therapy to help reduce his risk of recurrent stent thrombosis.  Continue medical therapy for his other small vessel disease. Results conveyed to the family.   DG Chest Port 1 View  Result Date: 10/22/2022 CLINICAL DATA:  STEMI.  Patient woke with chest pain today. EXAM: PORTABLE CHEST 1 VIEW COMPARISON:  Two-view chest x-ray 07/28/2021 FINDINGS: Heart size is normal. Changes of CABG again noted. Pacing wires are stable. Lung volumes are low. No edema or effusion is present. No focal airspace disease present. The visualized soft tissues and bony thorax are unremarkable. IMPRESSION: No active disease. Electronically Signed   By: Marin Roberts M.D.   On: 10/22/2022 08:40    Cardiac Studies   Cath 08/23/22  Diagnostic Dominance: Right  Intervention      Patient Profile     77 y.o. male with CABG in  2022. CABG X 4,presented with chest pain.   Assessment & Plan    NSTEMI:  Emergent cath treated very late stent thrombosis of BMS in distal RCA from 1999, with PTCA. Had prior CABG.    OK to discharge today.    DM:  A1C 6.8.  Continue current meds.  Follow up with Danella Penton, MD and educated on diet.    DYSLIPIDEMIA:  Zocor changed to Lipitor.  Repeat lipid in 3 months.   LPa pending  HTN:  Continue current meds.   For questions or updates, please contact [facility] Please consult [email or web address] for contact info under Cardiology/STEMI.   Signed, Rollene Rotunda, MD  10/23/2022, 8:18 AM

## 2022-10-23 NOTE — Care Management (Signed)
Provided patient with wife at bedside, Brilinta 30 day free card

## 2022-10-24 ENCOUNTER — Encounter (HOSPITAL_COMMUNITY): Payer: Self-pay | Admitting: Interventional Cardiology

## 2022-10-24 ENCOUNTER — Telehealth (HOSPITAL_COMMUNITY): Payer: Self-pay | Admitting: *Deleted

## 2022-10-24 LAB — POCT ACTIVATED CLOTTING TIME: Activated Clotting Time: 298 seconds

## 2022-10-24 NOTE — Telephone Encounter (Signed)
Discussed MI, pci, restrictions, diet, exercise, and CRPII. Pt has already been called by CRP[facility] and is eager to do [place]. Will send education materials to pt through mail.  Ethelda Chick BS, ACSM-CEP 10/24/2022 3:18 PM

## 2022-10-25 ENCOUNTER — Telehealth: Payer: Self-pay

## 2022-10-25 LAB — LIPOPROTEIN A (LPA): Lipoprotein (a): 37.5 nmol/L — ABNORMAL HIGH (ref <75.0)

## 2022-10-25 NOTE — Telephone Encounter (Signed)
Patient contacted regarding discharge from [facility] on April 28th, 2024.  Patient understands to follow up with provider Eligha Bridegroom, NP on 5/3 at 2:20pm at [facility]. Patient understands discharge instructions? yes Patient understands medications and regiment? yes Patient understands to bring all medications to this visit? yes  Patient states that he is feeling much better and has no further needs or questions at this time. He will back or discuss with Marcelino Duster on Friday if something comes up.

## 2022-10-25 NOTE — Telephone Encounter (Signed)
**Note De-identified  Obfuscation** -----  **Note De-Identified  Obfuscation** Message from Perlie Gold, PA-C sent at 10/23/2022  9:39 AM EDT ----- Regarding: TOC call needed This patient is discharging s/p NSTEMI and needs a TOC call. Thanks!  Perlie Gold PA-C

## 2022-10-25 NOTE — Telephone Encounter (Signed)
**Note De-Identified  Obfuscation** Forwarding to Triage to make this TCM Call.

## 2022-10-27 ENCOUNTER — Ambulatory Visit (INDEPENDENT_AMBULATORY_CARE_PROVIDER_SITE_OTHER): Payer: Medicare HMO

## 2022-10-27 DIAGNOSIS — I442 Atrioventricular block, complete: Secondary | ICD-10-CM | POA: Diagnosis not present

## 2022-10-27 LAB — CUP PACEART REMOTE DEVICE CHECK
Battery Remaining Longevity: 138 mo
Battery Voltage: 3.03 V
Brady Statistic AP VP Percent: 25.52 %
Brady Statistic AP VS Percent: 0.03 %
Brady Statistic AS VP Percent: 74.19 %
Brady Statistic AS VS Percent: 0.26 %
Brady Statistic RA Percent Paced: 25.53 %
Brady Statistic RV Percent Paced: 99.71 %
Date Time Interrogation Session: 20240502042624
Implantable Lead Connection Status: 753985
Implantable Lead Connection Status: 753985
Implantable Lead Implant Date: 20230201
Implantable Lead Implant Date: 20230201
Implantable Lead Location: 753859
Implantable Lead Location: 753860
Implantable Lead Model: 3830
Implantable Lead Model: 5076
Implantable Pulse Generator Implant Date: 20230201
Lead Channel Impedance Value: 285 Ohm
Lead Channel Impedance Value: 342 Ohm
Lead Channel Impedance Value: 361 Ohm
Lead Channel Impedance Value: 494 Ohm
Lead Channel Pacing Threshold Amplitude: 0.75 V
Lead Channel Pacing Threshold Amplitude: 0.875 V
Lead Channel Pacing Threshold Pulse Width: 0.4 ms
Lead Channel Pacing Threshold Pulse Width: 0.4 ms
Lead Channel Sensing Intrinsic Amplitude: 17 mV
Lead Channel Sensing Intrinsic Amplitude: 17 mV
Lead Channel Sensing Intrinsic Amplitude: 4.25 mV
Lead Channel Sensing Intrinsic Amplitude: 4.25 mV
Lead Channel Setting Pacing Amplitude: 1.5 V
Lead Channel Setting Pacing Amplitude: 2 V
Lead Channel Setting Pacing Pulse Width: 0.4 ms
Lead Channel Setting Sensing Sensitivity: 0.9 mV
Zone Setting Status: 755011
Zone Setting Status: 755011

## 2022-10-27 NOTE — Progress Notes (Signed)
Cardiology Office Note:    Date:  10/28/2022   ID:  Shane Ford, DOB [birth year], MRN [id number]  PCP:  Danella Penton, MD   [facility] Providers Cardiologist:  Donato Schultz, MD     Referring MD: Danella Penton, MD   Chief Complaint: follow-up cardiac cath  History of Present Illness:    Shane Ford is a very pleasant 77 y.o. male with a hx of CAD, HTN, HLD, early onset dementia, and type 2 diabetes.   History of BMS to RCA in 1999. STEMI related to occlusion of LV branch of RCA that was too far distal and too small to treat interventionally.  He underwent successful four-vessel bypass with LIMA to LAD, SVG to PL, SVG to OM, SVG to diagonal on 01/19/2021.  He was referred to EP for evaluation of symptomatic PVCs and high-grade heart block on cardiac monitor with 2-1 heart block as well as transient complete heart block and AV Wenkebach. He underwent PPM implant 07/28/21.  Last cardiology clinic visit was 01/17/2022 with Dr. Katrinka Blazing.  He was feeling well and had no specific cardiac complaints.  Admission 4/-10/23/22 for sudden onset chest pressure. He was found to have subtle ST segment changes on EKG that did not meet STEMI criteria, however given his symptoms he was taken urgently to cardiac catheterization lab. LHC on 10/22/22 revealed very late stent thrombosis of BMS from 1999 and distal RCA.  Surprising that the ECG changes were not more pronounced given the large territory involved.  The distal RCA stent occlusion was treated with balloon angioplasty.  Plan to continue DAPT with Brilinta for 12 months.  After that time with plan for long-term clopidogrel therapy to help reduce risk of recurrent stent thrombosis.  Continue medical therapy for his other small vessel disease. Due to hypokalemia status post NSTEMI, HCTZ was stopped and metoprolol tartrate 12.5 mg twice daily was started.  Today, he is here for follow-up with his wife. Noticed more fatigue after discharged on  metoprolol but feels like it is getting better. Would like to return to cardiac rehab. Symptoms prior to recent admission were mid-sternal severe CP radiating down left arm, diaphoresis, and vomiting. Since discharge, he has had no further episodes of chest pain. Is resuming regular activities. Home BP has been well controlled (120/77, 118/68, 134/74, 114/67).  He reports no problems with his medications.  He denies shortness of breath, palpitations, orthopnea, PND, edema, presyncope, syncope. He brought a list of questions and wanted to review the findings from his cath in detail which we did. All questions were answered to his satisfaction.    Past Medical History:  Diagnosis Date   Arthritis    pt states he was diagnosed 10+ years ago; psoriatic arthritis   Coronary artery disease    Depression    pt diagnosed 10+ years ago   Diabetes mellitus (HCC)    Dysrhythmia 01/17/2021   first degree AV block, 2nd degree AVB, Mobitz I in setting of MI   Hyperlipidemia    Hypertension     Past Surgical History:  Procedure Laterality Date   ANTERIOR CERVICAL DECOMP/DISCECTOMY FUSION N/A 05/12/2021   Procedure: ANTERIOR CERVICAL DECOMPRESSION FUSION CERVICAL 5- CERVICAL 6 WITH INSTRUMENTATION AND ALLOGRAFT;  Surgeon: Estill Bamberg, MD;  Location: [facility] OR;  Service: Orthopedics;  Laterality: N/A;   CARDIAC CATHETERIZATION     CORONARY ARTERY BYPASS GRAFT N/A 01/19/2021   Procedure: CORONARY ARTERY BYPASS GRAFTING (CABG) TIMES FOUR, ON PUMP, USING LEFT  INTERNAL MAMMARY ARTERY AND ENDOSCOPICALLY HARVESTED GREATER SAPHENOUS VEIN;  Surgeon: Corliss Skains, MD;  Location: [facility] OR;  Service: Open Heart Surgery;  Laterality: N/A;  flow trac   CORONARY/GRAFT ACUTE MI REVASCULARIZATION N/A 10/22/2022   Procedure: Coronary/Graft Acute MI Revascularization;  Surgeon: Corky Crafts, MD;  Location: [facility] INVASIVE CV LAB;  Service: Cardiovascular;  Laterality: N/A;   ENDOVEIN HARVEST OF GREATER SAPHENOUS VEIN  Right 01/19/2021   Procedure: ENDOVEIN HARVEST OF GREATER SAPHENOUS VEIN;  Surgeon: Corliss Skains, MD;  Location: [facility] OR;  Service: Open Heart Surgery;  Laterality: Right;   EYE SURGERY Bilateral 2021   cataracts replaced in both eyes   LEFT HEART CATH AND CORONARY ANGIOGRAPHY N/A 01/17/2021   Procedure: LEFT HEART CATH AND CORONARY ANGIOGRAPHY;  Surgeon: Lyn Records, MD;  Location: [facility] INVASIVE CV LAB;  Service: Cardiovascular;  Laterality: N/A;   LEFT HEART CATH AND CORS/GRAFTS ANGIOGRAPHY N/A 10/22/2022   Procedure: LEFT HEART CATH AND CORS/GRAFTS ANGIOGRAPHY;  Surgeon: Corky Crafts, MD;  Location: [facility] INVASIVE CV LAB;  Service: Cardiovascular;  Laterality: N/A;   PACEMAKER IMPLANT N/A 07/28/2021   Procedure: PACEMAKER IMPLANT;  Surgeon: Marinus Maw, MD;  Location: [facility] INVASIVE CV LAB;  Service: Cardiovascular;  Laterality: N/A;   TEE WITHOUT CARDIOVERSION N/A 01/19/2021   Procedure: TRANSESOPHAGEAL ECHOCARDIOGRAM (TEE);  Surgeon: Corliss Skains, MD;  Location: [facility] OR;  Service: Open Heart Surgery;  Laterality: N/A;    Current Medications: Current Meds  Medication Sig   acetaminophen (TYLENOL) 500 MG tablet Take 1,000 mg by mouth every 6 (six) hours as needed for moderate pain.   aspirin EC 81 MG tablet Take 81 mg by mouth in the morning. Swallow whole.   atorvastatin (LIPITOR) 80 MG tablet Take 1 tablet (80 mg total) by mouth daily.   Cholecalciferol 50 MCG (2000 UT) CAPS Take 2,000 Units by mouth in the morning.   docusate sodium (COLACE) 100 MG capsule Take 100 mg by mouth daily as needed for mild constipation.   donepezil (ARICEPT) 10 MG tablet Take 10 mg by mouth at bedtime.   empagliflozin (JARDIANCE) 10 MG TABS tablet Take 1 tablet (10 mg total) by mouth daily before breakfast.   fenofibrate 160 MG tablet Take 160 mg by mouth in the morning.   glimepiride (AMARYL) 1 MG tablet Take 1 mg by mouth in the morning and at bedtime.   metFORMIN (GLUCOPHAGE) 500 MG  tablet Take 500 mg by mouth 2 (two) times daily with a meal.   methotrexate (RHEUMATREX) 2.5 MG tablet Take 12.5 mg by mouth every Wednesday. Take 6 tablets (12.5mg ) once a week on Wednesdays. Caution:Chemotherapy. Protect from light.   metoprolol tartrate (LOPRESSOR) 25 MG tablet Take 0.5 tablets (12.5 mg total) by mouth 2 (two) times daily.   mirtazapine (REMERON) 15 MG tablet Take 15 mg by mouth at bedtime.   Polyethyl Glycol-Propyl Glycol (SYSTANE) 0.4-0.3 % SOLN Apply 1 drop to eye 2 (two) times daily as needed.   ticagrelor (BRILINTA) 90 MG TABS tablet Take 1 tablet (90 mg total) by mouth 2 (two) times daily.   vitamin B-12 (CYANOCOBALAMIN) 1000 MCG tablet Take 1,000 mcg by mouth daily.     Allergies:   Patient has no known allergies.   Social History   Socioeconomic History   Marital status: Married    Spouse name: Not on file   Number of children: 2   Years of education: 14   Highest education level: Not on file  Occupational History   Occupation: Retired  Tobacco Use   Smoking status: Never   Smokeless tobacco: Never  Vaping Use   Vaping Use: Never used  Substance and Sexual Activity   Alcohol use: Never   Drug use: Never   Sexual activity: Not on file  Other Topics Concern   Not on file  Social History Narrative   Not on file   Social Determinants of Health   Financial Resource Strain: Not on file  Food Insecurity: Not on file  Transportation Needs: Not on file  Physical Activity: Not on file  Stress: Not on file  Social Connections: Not on file     Family History: The patient's family history is not on file.  ROS:   Please see the history of present illness.   All other systems reviewed and are negative.  Labs/Other Studies Reviewed:    The following studies were reviewed today:  LHC 10/22/22   Mid LM to Dist LM lesion is 60% stenosed.  Ost LAD to Prox LAD lesion is 85% stenosed.  Patent LIMA to LAD.   1st Diag lesion is 75% stenosed.  Small vessel  with TIMI 3 flow.   2nd Diag lesion is 80% stenosed.  SVG to Diagonal is occluded.  Native vessel is small with TIMI 3 flow.   RPDA lesion is 90% stenosed.  Small tortuous vessel.  SVG to PDA is occluded.   1st RPL lesion is 100% stenosed.  RPAV lesion is 95% stenosed.  Small, tortuous branch vessels in the distal RCA territory.   Ost Cx to Prox Cx lesion is 100% stenosed.  SVG to OM is widely patent.   Mid RCA to Dist RCA lesion is 100% stenosed.  Balloon angioplasty was performed using a 2.5 semicompliant balloon followed by a BALL SAPPHIRE NC24 3.0X12.   Post intervention, there is a 0% residual stenosis.   The left ventricular systolic function is normal.   LV end diastolic pressure is normal.  LVEDP 12 mm Hg.   The left ventricular ejection fraction is 55-65% by visual estimate.   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  Surprising that the ECG changes were not more pronounced given the large territory involved.  The distal RCA stent occlusion was treated with balloon angioplasty.  Continue dual antiplatelet therapy with Brilinta for 12 months.  After that time, would plan for long-term clopidogrel therapy to help reduce his risk of recurrent stent thrombosis.  Continue medical therapy for his other small vessel disease.  Cardiac Monitor 07/16/21 First-degree AV block with episodes of second and third-degree AV block. Third-degree AV block occurs during hours consistent with sleep. Bundle branch block pattern PVC burden less than 1% No symptoms reported   Transient high-grade AV block, asymptomatic.  At high risk for sustained high-grade AV block.  We need to get an electrophysiology opinion about whether continued watchful waiting or pacemaker therapy is recommended.  Echo 01/18/21 1. Left ventricular ejection fraction, by estimation, is 55 to 60%. The  left ventricle has normal function. Left ventricular endocardial border  not optimally defined to  evaluate regional wall motion. There is moderate  left ventricular hypertrophy. Left  ventricular diastolic parameters are consistent with Grade I diastolic  dysfunction (impaired relaxation).   2. Right ventricular systolic function is normal. The right ventricular  size is normal. Tricuspid regurgitation signal is inadequate for assessing  PA pressure.   3. The mitral valve is normal in  structure. No evidence of mitral valve  regurgitation. No evidence of mitral stenosis.   4. The aortic valve is tricuspid. Aortic valve regurgitation is not  visualized. No aortic stenosis is present.   5. The inferior vena cava is normal in size with greater than 50%  respiratory variability, suggesting right atrial pressure of 3 mmHg.   6. Technically difficult study with poor acoustic windows.     LHC 01/17/21 80% distal left main Ostial to proximal 85% LAD, 80% first diagonal, 80% second diagonal, and apical LAD 75%. 99% proximal circumflex.  Distal circumflex fills faintly by right to left collaterals. Dominant right coronary with patent distal RCA stent, 90% ostial PDA, total occlusion of the first LV branch, and 95% stenosis of the second LV branch. Overall normal LV function with EF 55%.  Focal mid inferior wall akinesis.  LVEDP 8 mmHg. RECOMMENDATIONS:   Anatomy suggest that surgical revascularization would be the best treatment for this patient assuming he has no contraindications to surgery.  He is relatively stable at this time.  Plan to consult T CTS in a.m. IV heparin will be resumed in 6 to 8 hours IV nitroglycerin will be started With relatively low LV filling pressures, IV saline is started  Recent Labs: 10/22/2022: ALT 18 10/23/2022: BUN 16; Creatinine, Ser 1.11; Hemoglobin 13.8; Platelets 203; Potassium 3.5; Sodium 139  Recent Lipid Panel    Component Value Date/Time   CHOL 132 10/22/2022 0800   TRIG 87 10/22/2022 0800   HDL 48 10/22/2022 0800   CHOLHDL 2.8 10/22/2022 0800    VLDL 17 10/22/2022 0800   LDLCALC 67 10/22/2022 0800     Risk Assessment/Calculations:           Physical Exam:    VS:  BP 118/68   Pulse 71   Ht 6\' 3"  (1.905 m)   Wt 185 lb (83.9 kg)   SpO2 96%   BMI 23.12 kg/m     Wt Readings from Last 3 Encounters:  10/28/22 185 lb (83.9 kg)  10/23/22 182 lb 1.6 oz (82.6 kg)  01/17/22 182 lb 12.8 oz (82.9 kg)     GEN:  Well nourished, well developed in no acute distress HEENT: Normal NECK: No JVD; No carotid bruits CARDIAC: RRR, no murmurs, rubs, gallops RESPIRATORY:  Clear to auscultation without rales, wheezing or rhonchi  ABDOMEN: Soft, non-tender, non-distended MUSCULOSKELETAL:  No edema; No deformity. 2+ pedal pulses, equal bilaterally SKIN: Warm and dry NEUROLOGIC:  Alert and oriented x 3 PSYCHIATRIC:  Normal affect   EKG:  EKG is not ordered today  Diagnoses:    1. Coronary artery disease involving native coronary artery of native heart without angina pectoris   2. Pacemaker   3. Complete heart block (HCC)   4. Hyperlipidemia, unspecified hyperlipidemia type   5. Hypertension, unspecified type   6. S/P CABG x 4   7. ST elevation myocardial infarction involving right coronary artery (HCC)    Assessment and Plan:     CAD without angina s/p STEMI: History of CABG x 4 in 2022. STEMI 10/22/22 with cardiac cath that revealed very late stent thrombosis of bare-metal stent from 1999 in the distal RCA treated with balloon angioplasty.  He has other areas of stenosis which are small tortuous vessels and the SVG to PDA is occluded. Plan for DAPT with Brilinta and aspirin for 12 months, then plan for long-term clopidogrel therapy to reduce risk of recurrent stent thrombosis, continue medical therapy for small vessel disease. We  reviewed these results in detail. He denies chest pain, dyspnea, or other symptoms concerning for angina. Was previously on simvastatin, changed to atorvastatin during admission. No bleeding concerns. Continue  GDMT including metoprolol, atorvastatin, aspirin, Brilinta, empagliflozin. Will recheck lipids in 2 months.   Hyperlipidemia LDL goal < 55: LDL 67 on 10/22/22. We discussed LDL goal of less than 55 due to restenosis. He is agreeable to consider PCSK9i in the future if LDL not at goal. We will recheck fasting labs in 2 months.   Hypertension: BP is well controlled. Initially felt fatigued since starting metoprolol, now improving.  No medication changes today.  Complete heart block s/p PPM implant: Following CABG in 2022 he had symptomatic PVCs and heart block seen on cardiac monitor.  He was referred to EP and underwent pacemaker implant on 07/28/21. Normal device function on most recent remote device check. Management per EP.     Cardiac Rehabilitation Eligibility Assessment  The patient is ready to start cardiac rehabilitation from a cardiac standpoint.     Disposition: Keep your July appointment with Dr. Anne Fu  Medication Adjustments/Labs and Tests Ordered: Current medicines are reviewed at length with the patient today.  Concerns regarding medicines are outlined above.  Orders Placed This Encounter  Procedures   Basic metabolic panel   Lipid panel   CBC   No orders of the defined types were placed in this encounter.   Patient Instructions  Medication Instructions:  Your physician recommends that you continue on your current medications as directed. Please refer to the Current Medication list given to you today.  *If you need a refill on your cardiac medications before your next appointment, please call your pharmacy*   Lab Work: CBC, BMET, Lipids on 7/24  If you have labs (blood work) drawn today and your tests are completely normal, you will receive your results only by: MyChart Message (if you have MyChart) OR A paper copy in the mail If you have any lab test that is abnormal or we need to change your treatment, we will call you to review the results.  Follow-Up: At [facility], you and your health needs are our priority.  As part of our continuing mission to provide you with exceptional heart care, we have created designated Provider Care Teams.  These Care Teams include your primary Cardiologist (physician) and Advanced Practice Providers (APPs -  Physician Assistants and Nurse Practitioners) who all work together to provide you with the care you need, when you need it.   Your next appointment:   As scheduled   Provider:   Donato Schultz, MD        Signed, Levi Aland, NP  10/28/2022 4:05 PM    [facility]

## 2022-10-28 ENCOUNTER — Encounter: Payer: Self-pay | Admitting: Nurse Practitioner

## 2022-10-28 ENCOUNTER — Ambulatory Visit: Payer: Medicare HMO | Attending: Nurse Practitioner | Admitting: Nurse Practitioner

## 2022-10-28 VITALS — BP 118/68 | HR 71 | Ht 75.0 in | Wt 185.0 lb

## 2022-10-28 DIAGNOSIS — I2510 Atherosclerotic heart disease of native coronary artery without angina pectoris: Secondary | ICD-10-CM

## 2022-10-28 DIAGNOSIS — I442 Atrioventricular block, complete: Secondary | ICD-10-CM

## 2022-10-28 DIAGNOSIS — I2111 ST elevation (STEMI) myocardial infarction involving right coronary artery: Secondary | ICD-10-CM

## 2022-10-28 DIAGNOSIS — I10 Essential (primary) hypertension: Secondary | ICD-10-CM

## 2022-10-28 DIAGNOSIS — Z950 Presence of cardiac pacemaker: Secondary | ICD-10-CM | POA: Diagnosis not present

## 2022-10-28 DIAGNOSIS — Z951 Presence of aortocoronary bypass graft: Secondary | ICD-10-CM | POA: Diagnosis not present

## 2022-10-28 DIAGNOSIS — E785 Hyperlipidemia, unspecified: Secondary | ICD-10-CM | POA: Diagnosis not present

## 2022-10-28 NOTE — Patient Instructions (Signed)
Medication Instructions:  Your physician recommends that you continue on your current medications as directed. Please refer to the Current Medication list given to you today.  *If you need a refill on your cardiac medications before your next appointment, please call your pharmacy*   Lab Work: CBC, BMET, Lipids on 7/24  If you have labs (blood work) drawn today and your tests are completely normal, you will receive your results only by: MyChart Message (if you have MyChart) OR A paper copy in the mail If you have any lab test that is abnormal or we need to change your treatment, we will call you to review the results.  Follow-Up: At [facility], you and your health needs are our priority.  As part of our continuing mission to provide you with exceptional heart care, we have created designated Provider Care Teams.  These Care Teams include your primary Cardiologist (physician) and Advanced Practice Providers (APPs -  Physician Assistants and Nurse Practitioners) who all work together to provide you with the care you need, when you need it.   Your next appointment:   As scheduled   Provider:   Donato Schultz, MD

## 2022-10-31 DIAGNOSIS — I2119 ST elevation (STEMI) myocardial infarction involving other coronary artery of inferior wall: Secondary | ICD-10-CM | POA: Diagnosis not present

## 2022-10-31 DIAGNOSIS — I2510 Atherosclerotic heart disease of native coronary artery without angina pectoris: Secondary | ICD-10-CM | POA: Diagnosis not present

## 2022-10-31 DIAGNOSIS — T82897S Other specified complication of cardiac prosthetic devices, implants and grafts, sequela: Secondary | ICD-10-CM | POA: Diagnosis not present

## 2022-10-31 DIAGNOSIS — E119 Type 2 diabetes mellitus without complications: Secondary | ICD-10-CM | POA: Diagnosis not present

## 2022-11-02 ENCOUNTER — Telehealth (HOSPITAL_COMMUNITY): Payer: Self-pay

## 2022-11-02 NOTE — Telephone Encounter (Signed)
Called patient to see if he was interested in participating in the Cardiac Rehab Program. Patient stated yes. Patient will come in for orientation on 11/03/22 @ 10:30AM and will attend the 10:15AM exercise class. Went over insurance, patient verbalized understanding.   Pensions consultant.

## 2022-11-02 NOTE — Telephone Encounter (Signed)
Pt insurance is active and benefits verified through [facility]. Co-pay $25.00, DED $0.00/$0.00 met, out of pocket $3,600.00/$330.00 met, co-insurance 0%. No pre-authorization required. Passport, 11/02/22 @ 1:45PM, REF#[id number]   How many CR sessions are covered? (36 sessions/visits for TCR, 72 sessions/visits for ICR)72 visits Is this a lifetime maximum or an annual maximum? Annual Has the member used any of these services to date? No Is there a time limit (weeks/months) on start of program and/or program completion? No     Will contact patient to see if he is interested in the Cardiac Rehab Program.

## 2022-11-03 ENCOUNTER — Telehealth (HOSPITAL_COMMUNITY): Payer: Self-pay

## 2022-11-03 ENCOUNTER — Encounter (HOSPITAL_COMMUNITY)
Admission: RE | Admit: 2022-11-03 | Discharge: 2022-11-03 | Disposition: A | Discharge status: Home / Self Care | Payer: Medicare HMO | Source: Ambulatory Visit | Attending: Cardiology | Admitting: Cardiology

## 2022-11-03 VITALS — BP 104/56 | HR 61 | Ht 75.0 in | Wt 181.9 lb

## 2022-11-03 DIAGNOSIS — I214 Non-ST elevation (NSTEMI) myocardial infarction: Secondary | ICD-10-CM | POA: Diagnosis not present

## 2022-11-03 DIAGNOSIS — Z951 Presence of aortocoronary bypass graft: Secondary | ICD-10-CM | POA: Insufficient documentation

## 2022-11-03 DIAGNOSIS — Z9861 Coronary angioplasty status: Secondary | ICD-10-CM | POA: Diagnosis not present

## 2022-11-03 NOTE — Progress Notes (Signed)
Cardiac Rehab Medication Review   Does the patient  feel that his/her medications are working for him/her?  YES   Has the patient been experiencing any side effects to the medications prescribed?  NO  Does the patient measure his/her own blood pressure or blood glucose at home?  YES   Does the patient have any problems obtaining medications due to transportation or finances?  NO  Understanding of regimen: good Understanding of indications: good Potential of compliance: good    Comments: Shane Ford has a great understanding of his medications and a list of when to take them to help him keep track. He checks his BP and CBG's at home every morning.     [name], MS 11/03/2022 10:52 AM

## 2022-11-03 NOTE — Progress Notes (Signed)
Cardiac Individual Treatment Plan  Patient Details  Name: Shane Ford MRN: [id number] Date of Birth: [birth year] Referring Provider:   Flowsheet Row INTENSIVE CARDIAC REHAB ORIENT from 11/03/2022 in [facility]  Referring Provider Donato Schultz, MD       Initial Encounter Date:  Flowsheet Row INTENSIVE CARDIAC REHAB ORIENT from 11/03/2022 in [facility]  Date 11/03/22       Visit Diagnosis: NSTEMI (non-ST elevated myocardial infarction) (HCC)  S/P PTCA (percutaneous transluminal coronary angioplasty)  Patient's Home Medications on Admission:  Current Outpatient Medications:    acetaminophen (TYLENOL) 500 MG tablet, Take 1,000 mg by mouth every 6 (six) hours as needed for moderate pain., Disp: , Rfl:    aspirin EC 81 MG tablet, Take 81 mg by mouth in the morning. Swallow whole., Disp: , Rfl:    atorvastatin (LIPITOR) 80 MG tablet, Take 1 tablet (80 mg total) by mouth daily., Disp: 30 tablet, Rfl: 3   Cholecalciferol 50 MCG (2000 UT) CAPS, Take 2,000 Units by mouth in the morning., Disp: , Rfl:    docusate sodium (COLACE) 100 MG capsule, Take 100 mg by mouth daily as needed for mild constipation., Disp: , Rfl:    donepezil (ARICEPT) 10 MG tablet, Take 10 mg by mouth at bedtime., Disp: , Rfl:    empagliflozin (JARDIANCE) 10 MG TABS tablet, Take 1 tablet (10 mg total) by mouth daily before breakfast., Disp: 30 tablet, Rfl: 3   fenofibrate 160 MG tablet, Take 160 mg by mouth in the morning., Disp: , Rfl:    glimepiride (AMARYL) 1 MG tablet, Take 1 mg by mouth in the morning and at bedtime., Disp: , Rfl:    metFORMIN (GLUCOPHAGE) 500 MG tablet, Take 500 mg by mouth 2 (two) times daily with a meal., Disp: , Rfl:    methotrexate (RHEUMATREX) 2.5 MG tablet, Take 12.5 mg by mouth every Wednesday. Take 6 tablets (12.5mg ) once a week on Wednesdays. Caution:Chemotherapy. Protect  from light., Disp: , Rfl:    metoprolol tartrate (LOPRESSOR) 25 MG tablet, Take 0.5 tablets (12.5 mg total) by mouth 2 (two) times daily., Disp: 30 tablet, Rfl: 2   mirtazapine (REMERON) 15 MG tablet, Take 15 mg by mouth at bedtime., Disp: , Rfl:    Polyethyl Glycol-Propyl Glycol (SYSTANE) 0.4-0.3 % SOLN, Apply 1 drop to eye 2 (two) times daily as needed., Disp: , Rfl:    potassium chloride SA (KLOR-CON M) 20 MEQ tablet, Take 2 tablets (40 mEq total) by mouth once for 1 dose. On 4/29 take 2 tablets ( total) once., Disp: 2 tablet, Rfl: 0   ticagrelor (BRILINTA) 90 MG TABS tablet, Take 1 tablet (90 mg total) by mouth 2 (two) times daily., Disp: 60 tablet, Rfl: 3   vitamin B-12 (CYANOCOBALAMIN) 1000 MCG tablet, Take 1,000 mcg by mouth daily., Disp: , Rfl:   Past Medical History: Past Medical History:  Diagnosis Date   Arthritis    pt states he was diagnosed 10+ years ago; psoriatic arthritis   Coronary artery disease    Depression    pt diagnosed 10+ years ago   Diabetes mellitus (HCC)    Dysrhythmia 01/17/2021   first degree AV block, 2nd degree AVB, Mobitz I in setting of MI   Hyperlipidemia    Hypertension     Tobacco Use: Social History   Tobacco Use  Smoking Status Never  Smokeless Tobacco Never  Labs: Review Flowsheet  More data may exist      Latest Ref Rng & Units 01/17/2021 01/18/2021 01/19/2021 05/04/2021 10/22/2022  Labs for ITP Cardiac and Pulmonary Rehab  Cholestrol 0 - 200 mg/dL 161  - - - 096   LDL (calc) 0 - 99 mg/dL 71  - - - 67   HDL-C >04 mg/dL 45  - - - 48   Trlycerides <150 mg/dL 540  - - - 87   Hemoglobin A1c 4.8 - 5.6 % - 8.5  - 6.5  6.8   PH, Arterial 7.350 - 7.450 - - 7.369  7.335  7.370  7.455  7.375  7.350  - -  PCO2 arterial 32.0 - 48.0 mmHg - - 38.2  42.6  38.1  38.0  48.4  48.8  - -  Bicarbonate 20.0 - 28.0 mmol/L - - 21.9  22.7  22.0  26.8  26.4  28.3  27.0  - -  TCO2 22 - 32 mmol/L - - 23  24  23  21  27  28  26  28  30  20  28  28   - -   Acid-base deficit 0.0 - 2.0 mmol/L - - 3.0  3.0  3.0  - -  O2 Saturation % - - 98.0  99.0  100.0  100.0  85.0  100.0  100.0  - -    Capillary Blood Glucose: Lab Results  Component Value Date   GLUCAP 183 (H) 10/23/2022   GLUCAP 167 (H) 10/22/2022   GLUCAP 105 (H) 10/22/2022   GLUCAP 154 (H) 10/22/2022   GLUCAP 100 (H) 07/28/2021     Exercise Target Goals: Exercise Program Goal: Individual exercise prescription set using results from initial 6 min walk test and THRR while considering  patient's activity barriers and safety.   Exercise Prescription Goal: Initial exercise prescription builds to 30-45 minutes a day of aerobic activity, 2-3 days per week.  Home exercise guidelines will be given to patient during program as part of exercise prescription that the participant will acknowledge.  Activity Barriers & Risk Stratification:  Activity Barriers & Cardiac Risk Stratification - 11/03/22 1225       Activity Barriers & Cardiac Risk Stratification   Activity Barriers Arthritis;Back Problems;Neck/Spine Problems;Joint Problems;Shortness of Breath;Balance Concerns    Cardiac Risk Stratification High   <5 METS on            6 Minute Walk:  6 Minute Walk     Row Name 11/03/22 1224         6 Minute Walk   Phase Initial     Distance 1200 feet     Walk Time 6 minutes     # of Rest Breaks 0     MPH 2.27     METS 2.76     RPE 11     Perceived Dyspnea  1     VO2 Peak 9.65     Symptoms Yes (comment)     Comments mild SOB, resolved with rest. no pain     Resting HR 61 bpm     Resting BP 104/56     Resting Oxygen Saturation  98 %     Exercise Oxygen Saturation  during 6 min walk 98 %     Max Ex. HR 97 bpm     Max Ex. BP 120/58     2 Minute Post BP 110/60  Oxygen Initial Assessment:   Oxygen Re-Evaluation:   Oxygen Discharge (Final Oxygen Re-Evaluation):   Initial Exercise Prescription:  Initial Exercise Prescription - 11/03/22 1200        Date of Initial Exercise RX and Referring Provider   Date 11/03/22    Referring Provider Donato Schultz, MD    Expected Discharge Date 01/13/23      NuStep   Level 1    SPM 75    Minutes 15    METs 2      Track   Laps 18    Minutes 15    METs 2.5      Prescription Details   Frequency (times per week) 3    Duration Progress to 30 minutes of continuous aerobic without signs/symptoms of physical distress      Intensity   THRR 40-80% of Max Heartrate 58-115    Ratings of Perceived Exertion 11-13    Perceived Dyspnea 0-4      Progression   Progression Continue progressive overload as per policy without signs/symptoms or physical distress.      Resistance Training   Training Prescription Yes    Weight 3 lbs    Reps 10-15             Perform Capillary Blood Glucose checks as needed.  Exercise Prescription Changes:   Exercise Comments:   Exercise Goals and Review:   Exercise Goals     Row Name 11/03/22 1054             Exercise Goals   Increase Physical Activity Yes       Intervention Provide advice, education, support and counseling about physical activity/exercise needs.;Develop an individualized exercise prescription for aerobic and resistive training based on initial evaluation findings, risk stratification, comorbidities and participant's personal goals.       Expected Outcomes Short Term: Attend rehab on a regular basis to increase amount of physical activity.;Long Term: Exercising regularly at least 3-5 days a week.;Long Term: Add in home exercise to make exercise part of routine and to increase amount of physical activity.       Increase Strength and Stamina Yes       Intervention Provide advice, education, support and counseling about physical activity/exercise needs.;Develop an individualized exercise prescription for aerobic and resistive training based on initial evaluation findings, risk stratification, comorbidities and participant's personal goals.        Expected Outcomes Short Term: Increase workloads from initial exercise prescription for resistance, speed, and METs.;Short Term: Perform resistance training exercises routinely during rehab and add in resistance training at home;Long Term: Improve cardiorespiratory fitness, muscular endurance and strength as measured by increased METs and functional capacity ( )       Able to understand and use rate of perceived exertion (RPE) scale Yes       Intervention Provide education and explanation on how to use RPE scale       Expected Outcomes Short Term: Able to use RPE daily in rehab to express subjective intensity level;Long Term:  Able to use RPE to guide intensity level when exercising independently       Knowledge and understanding of Target Heart Rate Range (THRR) Yes       Intervention Provide education and explanation of THRR including how the numbers were predicted and where they are located for reference       Expected Outcomes Short Term: Able to state/look up THRR;Short Term: Able to use daily as guideline for intensity in rehab;Long Term:  Able to use THRR to govern intensity when exercising independently       Understanding of Exercise Prescription Yes       Intervention Provide education, explanation, and written materials on patient's individual exercise prescription       Expected Outcomes Short Term: Able to explain program exercise prescription;Long Term: Able to explain home exercise prescription to exercise independently                Exercise Goals Re-Evaluation :   Discharge Exercise Prescription (Final Exercise Prescription Changes):   Nutrition:  Target Goals: Understanding of nutrition guidelines, daily intake of sodium 1500mg , cholesterol 200mg , calories 30% from fat and 7% or less from saturated fats, daily to have 5 or more servings of fruits and vegetables.  Biometrics:  Pre Biometrics - 11/03/22 1030       Pre Biometrics   Waist Circumference 38  inches    Hip Circumference 38.25 inches    Waist to Hip Ratio 0.99 %    Triceps Skinfold 24 mm    % Body Fat 26.5 %    Grip Strength 38 kg    Flexibility 0 in   could not reach   Single Leg Stand 4.68 seconds              Nutrition Therapy Plan and Nutrition Goals:   Nutrition Assessments:  MEDIFICTS Score Key: ?70 Need to make dietary changes  40-70 Heart Healthy Diet ? 40 Therapeutic Level Cholesterol Diet    Picture Your Plate Scores: <29 Unhealthy dietary pattern with much room for improvement. 41-50 Dietary pattern unlikely to meet recommendations for good health and room for improvement. 51-60 More healthful dietary pattern, with some room for improvement.  >60 Healthy dietary pattern, although there may be some specific behaviors that could be improved.    Nutrition Goals Re-Evaluation:   Nutrition Goals Re-Evaluation:   Nutrition Goals Discharge (Final Nutrition Goals Re-Evaluation):   Psychosocial: Target Goals: Acknowledge presence or absence of significant depression and/or stress, maximize coping skills, provide positive support system. Participant is able to verbalize types and ability to use techniques and skills needed for reducing stress and depression.  Initial Review & Psychosocial Screening:  Initial Psych Review & Screening - 11/03/22 1233       Initial Review   Current issues with Current Depression;Current Anxiety/Panic;Current Psychotropic Meds      Family Dynamics   Good Support System? Yes   Larita Fife has his family for support   Comments Larita Fife is currently on medication for depression and anxiety. He shared that he feels this medication is working for him currently and is happy with his regime. He has been managing his mental health with his PCP of almost 20 years and is happy with his care. He stated that he still has some bad days, but nothing "more than he can handle". Larita Fife was reassured that we have resources should he need additional  help.      Barriers   Psychosocial barriers to participate in program The patient should benefit from training in stress management and relaxation.      Screening Interventions   Interventions Encouraged to exercise;Provide feedback about the scores to participant    Expected Outcomes Short Term goal: Identification and review with participant of any Quality of Life or Depression concerns found by scoring the questionnaire.;Long Term goal: The participant improves quality of Life and PHQ9 Scores as seen by post scores and/or verbalization of changes;Long Term Goal: Stressors or current issues  are controlled or eliminated.             Quality of Life Scores:  Quality of Life - 11/03/22 1236       Quality of Life   Select Quality of Life      Quality of Life Scores   Health/Function Pre 22.7 %    Socioeconomic Pre 27.21 %    Psych/Spiritual Pre 24.64 %    Family Pre 28.8 %    GLOBAL Pre 24.93 %            Scores of 19 and below usually indicate a poorer quality of life in these areas.  A difference of  2-3 points is a clinically meaningful difference.  A difference of 2-3 points in the total score of the Quality of Life Index has been associated with significant improvement in overall quality of life, self-image, physical symptoms, and general health in studies assessing change in quality of life.  PHQ-9: Review Flowsheet       11/03/2022 04/13/2021  Depression screen PHQ 2/9  Decreased Interest 0 0  Down, Depressed, Hopeless 1 0  PHQ - 2 Score 1 0  Altered sleeping 0 -  Tired, decreased energy 0 -  Change in appetite 0 -  Feeling bad or failure about yourself  1 -  Trouble concentrating 1 -  Moving slowly or fidgety/restless 0 -  Suicidal thoughts 0 -  PHQ-9 Score 3 -  Difficult doing work/chores Somewhat difficult -   Interpretation of Total Score  Total Score Depression Severity:  1-4 = Minimal depression, 5-9 = Mild depression, 10-14 = Moderate depression,  15-19 = Moderately severe depression, 20-27 = Severe depression   Psychosocial Evaluation and Intervention:   Psychosocial Re-Evaluation:   Psychosocial Discharge (Final Psychosocial Re-Evaluation):   Vocational Rehabilitation: Provide vocational rehab assistance to qualifying candidates.   Vocational Rehab Evaluation & Intervention:  Vocational Rehab - 11/03/22 1055       Initial Vocational Rehab Evaluation & Intervention   Assessment shows need for Vocational Rehabilitation No   Pasha is retired            Education: Education Goals: Education classes will be provided on a weekly basis, covering required topics. Participant will state understanding/return demonstration of topics presented.     Core Videos: Exercise    Move It!  Clinical staff conducted group or individual video education with verbal and written material and guidebook.  Patient learns the recommended Pritikin exercise program. Exercise with the goal of living a long, healthy life. Some of the health benefits of exercise include controlled diabetes, healthier blood pressure levels, improved cholesterol levels, improved heart and lung capacity, improved sleep, and better body composition. Everyone should speak with their doctor before starting or changing an exercise routine.  Biomechanical Limitations Clinical staff conducted group or individual video education with verbal and written material and guidebook.  Patient learns how biomechanical limitations can impact exercise and how we can mitigate and possibly overcome limitations to have an impactful and balanced exercise routine.  Body Composition Clinical staff conducted group or individual video education with verbal and written material and guidebook.  Patient learns that body composition (ratio of muscle mass to fat mass) is a key component to assessing overall fitness, rather than body weight alone. Increased fat mass, especially visceral belly fat,  can put [place] at increased risk for metabolic syndrome, type 2 diabetes, heart disease, and even death. It is recommended to combine diet and exercise (cardiovascular and  resistance training) to improve your body composition. Seek guidance from your physician and exercise physiologist before implementing an exercise routine.  Exercise Action Plan Clinical staff conducted group or individual video education with verbal and written material and guidebook.  Patient learns the recommended strategies to achieve and enjoy long-term exercise adherence, including variety, self-motivation, self-efficacy, and positive decision making. Benefits of exercise include fitness, good health, weight management, more energy, better sleep, less stress, and overall well-being.  Medical   Heart Disease Risk Reduction Clinical staff conducted group or individual video education with verbal and written material and guidebook.  Patient learns our heart is our most vital organ as it circulates oxygen, nutrients, white blood cells, and hormones throughout the entire body, and carries waste away. Data supports a plant-based eating plan like the Pritikin Program for its effectiveness in slowing progression of and reversing heart disease. The video provides a number of recommendations to address heart disease.   Metabolic Syndrome and Belly Fat  Clinical staff conducted group or individual video education with verbal and written material and guidebook.  Patient learns what metabolic syndrome is, how it leads to heart disease, and how one can reverse it and keep it from coming back. You have metabolic syndrome if you have 3 of the following 5 criteria: abdominal obesity, high blood pressure, high triglycerides, low HDL cholesterol, and high blood sugar.  Hypertension and Heart Disease Clinical staff conducted group or individual video education with verbal and written material and guidebook.  Patient learns that high blood pressure,  or hypertension, is very common in the [place]. Hypertension is largely due to excessive salt intake, but other important risk factors include being overweight, physical inactivity, drinking too much alcohol, smoking, and not eating enough potassium from fruits and vegetables. High blood pressure is a leading risk factor for heart attack, stroke, congestive heart failure, dementia, kidney failure, and premature death. Long-term effects of excessive salt intake include stiffening of the arteries and thickening of heart muscle and organ damage. Recommendations include ways to reduce hypertension and the risk of heart disease.  Diseases of Our Time - Focusing on Diabetes Clinical staff conducted group or individual video education with verbal and written material and guidebook.  Patient learns why the best way to stop diseases of our time is prevention, through food and other lifestyle changes. Medicine (such as prescription pills and surgeries) is often only a Band-Aid on the problem, not a long-term solution. Most common diseases of our time include obesity, type 2 diabetes, hypertension, heart disease, and cancer. The Pritikin Program is recommended and has been proven to help reduce, reverse, and/or prevent the damaging effects of metabolic syndrome.  Nutrition   Overview of the Pritikin Eating Plan  Clinical staff conducted group or individual video education with verbal and written material and guidebook.  Patient learns about the Pritikin Eating Plan for disease risk reduction. The Pritikin Eating Plan emphasizes a wide variety of unrefined, minimally-processed carbohydrates, like fruits, vegetables, whole grains, and legumes. Go, Caution, and Stop food choices are explained. Plant-based and lean animal proteins are emphasized. Rationale provided for low sodium intake for blood pressure control, low added sugars for blood sugar stabilization, and low added fats and oils for coronary artery disease  risk reduction and weight management.  Calorie Density  Clinical staff conducted group or individual video education with verbal and written material and guidebook.  Patient learns about calorie density and how it impacts the Pritikin Eating Plan. Knowing the characteristics of  the food you choose will help you decide whether those foods will lead to weight gain or weight loss, and whether you want to consume more or less of them. Weight loss is usually a side effect of the Pritikin Eating Plan because of its focus on low calorie-dense foods.  Label Reading  Clinical staff conducted group or individual video education with verbal and written material and guidebook.  Patient learns about the Pritikin recommended label reading guidelines and corresponding recommendations regarding calorie density, added sugars, sodium content, and whole grains.  Dining Out - Part 1  Clinical staff conducted group or individual video education with verbal and written material and guidebook.  Patient learns that restaurant meals can be sabotaging because they can be so high in calories, fat, sodium, and/or sugar. Patient learns recommended strategies on how to positively address this and avoid unhealthy pitfalls.  Facts on Fats  Clinical staff conducted group or individual video education with verbal and written material and guidebook.  Patient learns that lifestyle modifications can be just as effective, if not more so, as many medications for lowering your risk of heart disease. A Pritikin lifestyle can help to reduce your risk of inflammation and atherosclerosis (cholesterol build-up, or plaque, in the artery walls). Lifestyle interventions such as dietary choices and physical activity address the cause of atherosclerosis. A review of the types of fats and their impact on blood cholesterol levels, along with dietary recommendations to reduce fat intake is also included.  Nutrition Action Plan  Clinical staff  conducted group or individual video education with verbal and written material and guidebook.  Patient learns how to incorporate Pritikin recommendations into their lifestyle. Recommendations include planning and keeping personal health goals in mind as an important part of their success.  Healthy Mind-Set    Healthy Minds, Bodies, Hearts  Clinical staff conducted group or individual video education with verbal and written material and guidebook.  Patient learns how to identify when they are stressed. Video will discuss the impact of that stress, as well as the many benefits of stress management. Patient will also be introduced to stress management techniques. The way we think, act, and feel has an impact on our hearts.  How Our Thoughts Can Heal Our Hearts  Clinical staff conducted group or individual video education with verbal and written material and guidebook.  Patient learns that negative thoughts can cause depression and anxiety. This can result in negative lifestyle behavior and serious health problems. Cognitive behavioral therapy is an effective method to help control our thoughts in order to change and improve our emotional outlook.  Additional Videos:  Exercise    Improving Performance  Clinical staff conducted group or individual video education with verbal and written material and guidebook.  Patient learns to use a non-linear approach by alternating intensity levels and lengths of time spent exercising to help burn more calories and lose more body fat. Cardiovascular exercise helps improve heart health, metabolism, hormonal balance, blood sugar control, and recovery from fatigue. Resistance training improves strength, endurance, balance, coordination, reaction time, metabolism, and muscle mass. Flexibility exercise improves circulation, posture, and balance. Seek guidance from your physician and exercise physiologist before implementing an exercise routine and learn your capabilities  and proper form for all exercise.  Introduction to Yoga  Clinical staff conducted group or individual video education with verbal and written material and guidebook.  Patient learns about yoga, a discipline of the coming together of mind, breath, and body. The benefits of yoga include  improved flexibility, improved range of motion, better posture and core strength, increased lung function, weight loss, and positive self-image. Yoga's heart health benefits include lowered blood pressure, healthier heart rate, decreased cholesterol and triglyceride levels, improved immune function, and reduced stress. Seek guidance from your physician and exercise physiologist before implementing an exercise routine and learn your capabilities and proper form for all exercise.  Medical   Aging: Enhancing Your Quality of Life  Clinical staff conducted group or individual video education with verbal and written material and guidebook.  Patient learns key strategies and recommendations to stay in good physical health and enhance quality of life, such as prevention strategies, having an advocate, securing a Health Care Proxy and Power of Attorney, and keeping a list of medications and system for tracking them. It also discusses how to avoid risk for bone loss.  Biology of Weight Control  Clinical staff conducted group or individual video education with verbal and written material and guidebook.  Patient learns that weight gain occurs because we consume more calories than we burn (eating more, moving less). Even if your body weight is normal, you may have higher ratios of fat compared to muscle mass. Too much body fat puts you at increased risk for cardiovascular disease, heart attack, stroke, type 2 diabetes, and obesity-related cancers. In addition to exercise, following the Pritikin Eating Plan can help reduce your risk.  Decoding Lab Results  Clinical staff conducted group or individual video education with verbal and  written material and guidebook.  Patient learns that lab test reflects one measurement whose values change over time and are influenced by many factors, including medication, stress, sleep, exercise, food, hydration, pre-existing medical conditions, and more. It is recommended to use the knowledge from this video to become more involved with your lab results and evaluate your numbers to speak with your doctor.   Diseases of Our Time - Overview  Clinical staff conducted group or individual video education with verbal and written material and guidebook.  Patient learns that according to the CDC, 50% to 70% of chronic diseases (such as obesity, type 2 diabetes, elevated lipids, hypertension, and heart disease) are avoidable through lifestyle improvements including healthier food choices, listening to satiety cues, and increased physical activity.  Sleep Disorders Clinical staff conducted group or individual video education with verbal and written material and guidebook.  Patient learns how good quality and duration of sleep are important to overall health and well-being. Patient also learns about sleep disorders and how they impact health along with recommendations to address them, including discussing with a physician.  Nutrition  Dining Out - Part 2 Clinical staff conducted group or individual video education with verbal and written material and guidebook.  Patient learns how to plan ahead and communicate in order to maximize their dining experience in a healthy and nutritious manner. Included are recommended food choices based on the type of restaurant the patient is visiting.   Fueling a Banker conducted group or individual video education with verbal and written material and guidebook.  There is a strong connection between our food choices and our health. Diseases like obesity and type 2 diabetes are very prevalent and are in large-part due to lifestyle choices. The  Pritikin Eating Plan provides plenty of food and hunger-curbing satisfaction. It is easy to follow, affordable, and helps reduce health risks.  Menu Workshop  Clinical staff conducted group or individual video education with verbal and written material and guidebook.  Patient learns  that restaurant meals can sabotage health goals because they are often packed with calories, fat, sodium, and sugar. Recommendations include strategies to plan ahead and to communicate with the manager, chef, or server to help order a healthier meal.  Planning Your Eating Strategy  Clinical staff conducted group or individual video education with verbal and written material and guidebook.  Patient learns about the Pritikin Eating Plan and its benefit of reducing the risk of disease. The Pritikin Eating Plan does not focus on calories. Instead, it emphasizes high-quality, nutrient-rich foods. By knowing the characteristics of the foods, we choose, we can determine their calorie density and make informed decisions.  Targeting Your Nutrition Priorities  Clinical staff conducted group or individual video education with verbal and written material and guidebook.  Patient learns that lifestyle habits have a tremendous impact on disease risk and progression. This video provides eating and physical activity recommendations based on your personal health goals, such as reducing LDL cholesterol, losing weight, preventing or controlling type 2 diabetes, and reducing high blood pressure.  Vitamins and Minerals  Clinical staff conducted group or individual video education with verbal and written material and guidebook.  Patient learns different ways to obtain key vitamins and minerals, including through a recommended healthy diet. It is important to discuss all supplements you take with your doctor.   Healthy Mind-Set    Smoking Cessation  Clinical staff conducted group or individual video education with verbal and written  material and guidebook.  Patient learns that cigarette smoking and tobacco addiction pose a serious health risk which affects millions of people. Stopping smoking will significantly reduce the risk of heart disease, lung disease, and many forms of cancer. Recommended strategies for quitting are covered, including working with your doctor to develop a successful plan.  Culinary   Becoming a Set designer conducted group or individual video education with verbal and written material and guidebook.  Patient learns that cooking at home can be healthy, cost-effective, quick, and puts them in control. Keys to cooking healthy recipes will include looking at your recipe, assessing your equipment needs, planning ahead, making it simple, choosing cost-effective seasonal ingredients, and limiting the use of added fats, salts, and sugars.  Cooking - Breakfast and Snacks  Clinical staff conducted group or individual video education with verbal and written material and guidebook.  Patient learns how important breakfast is to satiety and nutrition through the entire day. Recommendations include key foods to eat during breakfast to help stabilize blood sugar levels and to prevent overeating at meals later in the day. Planning ahead is also a key component.  Cooking - Educational psychologist conducted group or individual video education with verbal and written material and guidebook.  Patient learns eating strategies to improve overall health, including an approach to cook more at home. Recommendations include thinking of animal protein as a side on your plate rather than center stage and focusing instead on lower calorie dense options like vegetables, fruits, whole grains, and plant-based proteins, such as beans. Making sauces in large quantities to freeze for later and leaving the skin on your vegetables are also recommended to maximize your experience.  Cooking - Healthy Salads and  Dressing Clinical staff conducted group or individual video education with verbal and written material and guidebook.  Patient learns that vegetables, fruits, whole grains, and legumes are the foundations of the Pritikin Eating Plan. Recommendations include how to incorporate each of these in flavorful and healthy salads,  and how to create homemade salad dressings. Proper handling of ingredients is also covered. Cooking - Soups and [employer] - Soups and Desserts Clinical staff conducted group or individual video education with verbal and written material and guidebook.  Patient learns that Pritikin soups and desserts make for easy, nutritious, and delicious snacks and meal components that are low in sodium, fat, sugar, and calorie density, while high in vitamins, minerals, and filling fiber. Recommendations include simple and healthy ideas for soups and desserts.   Overview     The Pritikin Solution Program Overview Clinical staff conducted group or individual video education with verbal and written material and guidebook.  Patient learns that the results of the Pritikin Program have been documented in more than 100 articles published in peer-reviewed journals, and the benefits include reducing risk factors for (and, in some cases, even reversing) high cholesterol, high blood pressure, type 2 diabetes, obesity, and more! An overview of the three key pillars of the Pritikin Program will be covered: eating well, doing regular exercise, and having a healthy mind-set.  WORKSHOPS  Exercise: Exercise Basics: Building Your Action Plan Clinical staff led group instruction and group discussion with PowerPoint presentation and patient guidebook. To enhance the learning environment the use of posters, models and videos may be added. At the conclusion of this workshop, patients will comprehend the difference between physical activity and exercise, as well as the benefits of incorporating both, into  their routine. Patients will understand the FITT (Frequency, Intensity, Time, and Type) principle and how to use it to build an exercise action plan. In addition, safety concerns and other considerations for exercise and cardiac rehab will be addressed by the presenter. The purpose of this lesson is to promote a comprehensive and effective weekly exercise routine in order to improve patients' overall level of fitness.   Managing Heart Disease: Your Path to a Healthier Heart Clinical staff led group instruction and group discussion with PowerPoint presentation and patient guidebook. To enhance the learning environment the use of posters, models and videos may be added.At the conclusion of this workshop, patients will understand the anatomy and physiology of the heart. Additionally, they will understand how Pritikin's three pillars impact the risk factors, the progression, and the management of heart disease.  The purpose of this lesson is to provide a high-level overview of the heart, heart disease, and how the Pritikin lifestyle positively impacts risk factors.  Exercise Biomechanics Clinical staff led group instruction and group discussion with PowerPoint presentation and patient guidebook. To enhance the learning environment the use of posters, models and videos may be added. Patients will learn how the structural parts of their bodies function and how these functions impact their daily activities, movement, and exercise. Patients will learn how to promote a neutral spine, learn how to manage pain, and identify ways to improve their physical movement in order to promote healthy living. The purpose of this lesson is to expose patients to common physical limitations that impact physical activity. Participants will learn practical ways to adapt and manage aches and pains, and to minimize their effect on regular exercise. Patients will learn how to maintain good posture while sitting, walking, and  lifting.  Balance Training and Fall Prevention  Clinical staff led group instruction and group discussion with PowerPoint presentation and patient guidebook. To enhance the learning environment the use of posters, models and videos may be added. At the conclusion of this workshop, patients will understand the importance of their sensorimotor skills (  vision, proprioception, and the vestibular system) in maintaining their ability to balance as they age. Patients will apply a variety of balancing exercises that are appropriate for their current level of function. Patients will understand the common causes for poor balance, possible solutions to these problems, and ways to modify their physical environment in order to minimize their fall risk. The purpose of this lesson is to teach patients about the importance of maintaining balance as they age and ways to minimize their risk of falling.  WORKSHOPS   Nutrition:  Fueling a Ship broker led group instruction and group discussion with PowerPoint presentation and patient guidebook. To enhance the learning environment the use of posters, models and videos may be added. Patients will review the foundational principles of the Pritikin Eating Plan and understand what constitutes a serving size in each of the food groups. Patients will also learn Pritikin-friendly foods that are better choices when away from home and review make-ahead meal and snack options. Calorie density will be reviewed and applied to three nutrition priorities: weight maintenance, weight loss, and weight gain. The purpose of this lesson is to reinforce (in a group setting) the key concepts around what patients are recommended to eat and how to apply these guidelines when away from home by planning and selecting Pritikin-friendly options. Patients will understand how calorie density may be adjusted for different weight management goals.  Mindful Eating  Clinical staff led  group instruction and group discussion with PowerPoint presentation and patient guidebook. To enhance the learning environment the use of posters, models and videos may be added. Patients will briefly review the concepts of the Pritikin Eating Plan and the importance of low-calorie dense foods. The concept of mindful eating will be introduced as well as the importance of paying attention to internal hunger signals. Triggers for non-hunger eating and techniques for dealing with triggers will be explored. The purpose of this lesson is to provide patients with the opportunity to review the basic principles of the Pritikin Eating Plan, discuss the value of eating mindfully and how to measure internal cues of hunger and fullness using the Hunger Scale. Patients will also discuss reasons for non-hunger eating and learn strategies to use for controlling emotional eating.  Targeting Your Nutrition Priorities Clinical staff led group instruction and group discussion with PowerPoint presentation and patient guidebook. To enhance the learning environment the use of posters, models and videos may be added. Patients will learn how to determine their genetic susceptibility to disease by reviewing their family history. Patients will gain insight into the importance of diet as part of an overall healthy lifestyle in mitigating the impact of genetics and other environmental insults. The purpose of this lesson is to provide patients with the opportunity to assess their personal nutrition priorities by looking at their family history, their own health history and current risk factors. Patients will also be able to discuss ways of prioritizing and modifying the Pritikin Eating Plan for their highest risk areas  Menu  Clinical staff led group instruction and group discussion with PowerPoint presentation and patient guidebook. To enhance the learning environment the use of posters, models and videos may be added. Using menus  brought in from [employer], or printed from [employer], patients will apply the Pritikin dining out guidelines that were presented in the [employer] video. Patients will also be able to practice these guidelines in a variety of provided scenarios. The purpose of this lesson is to provide patients  with the opportunity to practice hands-on learning of the [employer] guidelines with actual menus and practice scenarios.  Label Reading Clinical staff led group instruction and group discussion with PowerPoint presentation and patient guidebook. To enhance the learning environment the use of posters, models and videos may be added. Patients will review and discuss the Pritikin label reading guidelines presented in Pritikin's Label Reading Educational series video. Using fool labels brought in from local grocery stores and markets, patients will apply the label reading guidelines and determine if the packaged food meet the Pritikin guidelines. The purpose of this lesson is to provide patients with the opportunity to review, discuss, and practice hands-on learning of the Pritikin Label Reading guidelines with actual packaged food labels. Cooking School  Pritikin's [employer] are designed to teach patients ways to prepare quick, simple, and affordable recipes at home. The importance of nutrition's role in chronic disease risk reduction is reflected in its emphasis in the overall Pritikin program. By learning how to prepare essential core Pritikin Eating Plan recipes, patients will increase control over what they eat; be able to customize the flavor of foods without the use of added salt, sugar, or fat; and improve the quality of the food they consume. By learning a set of core recipes which are easily assembled, quickly prepared, and affordable, patients are more likely to prepare more healthy foods at home. These workshops focus on convenient breakfasts, simple  entres, side dishes, and desserts which can be prepared with minimal effort and are consistent with nutrition recommendations for cardiovascular risk reduction. Cooking [employer] are taught by a Armed forces logistics/support/administrative officer (RD) who has been trained by the [employer]. The chef or RD has a clear understanding of the importance of minimizing - if not completely eliminating - added fat, sugar, and sodium in recipes. Throughout the series of Cooking School Workshop sessions, patients will learn about healthy ingredients and efficient methods of cooking to build confidence in their capability to prepare    Cooking School weekly topics:  Adding Flavor- Sodium-Free  Fast and Healthy Breakfasts  Powerhouse Plant-Based Proteins  Satisfying Salads and Dressings  Simple Sides and Sauces  International Cuisine-Spotlight on the [employer] Zones  Delicious Desserts  Savory Soups  [employer] - Meals in a Astronomer Appetizers and Snacks  Comforting Weekend Breakfasts  One-Pot Wonders   Fast Evening Meals  Landscape architect Your Pritikin Plate  WORKSHOPS   Healthy Mindset (Psychosocial):  Focused Goals, Sustainable Changes Clinical staff led group instruction and group discussion with PowerPoint presentation and patient guidebook. To enhance the learning environment the use of posters, models and videos may be added. Patients will be able to apply effective goal setting strategies to establish at least one personal goal, and then take consistent, meaningful action toward that goal. They will learn to identify common barriers to achieving personal goals and develop strategies to overcome them. Patients will also gain an understanding of how our mind-set can impact our ability to achieve goals and the importance of cultivating a positive and growth-oriented mind-set. The purpose of this lesson is to provide patients with a deeper understanding of how to set and  achieve personal goals, as well as the tools and strategies needed to overcome common obstacles which may arise along the way.  From Head to Heart: The Power of a Healthy Outlook  Clinical staff led group instruction and group discussion with PowerPoint presentation and patient guidebook. To enhance  the learning environment the use of posters, models and videos may be added. Patients will be able to recognize and describe the impact of emotions and mood on physical health. They will discover the importance of self-care and explore self-care practices which may work for them. Patients will also learn how to utilize the 4 C's to cultivate a healthier outlook and better manage stress and challenges. The purpose of this lesson is to demonstrate to patients how a healthy outlook is an essential part of maintaining good health, especially as they continue their cardiac rehab journey.  Healthy Sleep for a Healthy Heart Clinical staff led group instruction and group discussion with PowerPoint presentation and patient guidebook. To enhance the learning environment the use of posters, models and videos may be added. At the conclusion of this workshop, patients will be able to demonstrate knowledge of the importance of sleep to overall health, well-being, and quality of life. They will understand the symptoms of, and treatments for, common sleep disorders. Patients will also be able to identify daytime and nighttime behaviors which impact sleep, and they will be able to apply these tools to help manage sleep-related challenges. The purpose of this lesson is to provide patients with a general overview of sleep and outline the importance of quality sleep. Patients will learn about a few of the most common sleep disorders. Patients will also be introduced to the concept of "sleep hygiene," and discover ways to self-manage certain sleeping problems through simple daily behavior changes. Finally, the workshop will motivate  patients by clarifying the links between quality sleep and their goals of heart-healthy living.   Recognizing and Reducing Stress Clinical staff led group instruction and group discussion with PowerPoint presentation and patient guidebook. To enhance the learning environment the use of posters, models and videos may be added. At the conclusion of this workshop, patients will be able to understand the types of stress reactions, differentiate between acute and chronic stress, and recognize the impact that chronic stress has on their health. They will also be able to apply different coping mechanisms, such as reframing negative self-talk. Patients will have the opportunity to practice a variety of stress management techniques, such as deep abdominal breathing, progressive muscle relaxation, and/or guided imagery.  The purpose of this lesson is to educate patients on the role of stress in their lives and to provide healthy techniques for coping with it.  Learning Barriers/Preferences:  Learning Barriers/Preferences - 11/03/22 1236       Learning Barriers/Preferences   Learning Barriers Sight;Inability to learn new things   wears glasses, has mild dementia   Learning Preferences Audio;Computer/Internet;Group Instruction;Individual Instruction;Pictoral;Skilled Demonstration;Verbal Instruction;Written Material;Video             Education Topics:  Knowledge Questionnaire Score:  Knowledge Questionnaire Score - 11/03/22 1236       Knowledge Questionnaire Score   Pre Score 19/24             Core Components/Risk Factors/Patient Goals at Admission:  Personal Goals and Risk Factors at Admission - 11/03/22 1055       Core Components/Risk Factors/Patient Goals on Admission    Weight Management Yes;Weight Maintenance    Intervention Weight Management: Develop a combined nutrition and exercise program designed to reach desired caloric intake, while maintaining appropriate intake of nutrient  and fiber, sodium and fats, and appropriate energy expenditure required for the weight goal.;Weight Management: Provide education and appropriate resources to help participant work on and attain dietary goals.  Expected Outcomes Short Term: Continue to assess and modify interventions until short term weight is achieved;Long Term: Adherence to nutrition and physical activity/exercise program aimed toward attainment of established weight goal;Weight Maintenance: Understanding of the daily nutrition guidelines, which includes 25-35% calories from fat, 7% or less cal from saturated fats, less than 200mg  cholesterol, less than 1.5gm of sodium, & 5 or more servings of fruits and vegetables daily;Understanding recommendations for meals to include 15-35% energy as protein, 25-35% energy from fat, 35-60% energy from carbohydrates, less than 200mg  of dietary cholesterol, 20-35 gm of total fiber daily;Understanding of distribution of calorie intake throughout the day with the consumption of 4-5 meals/snacks    Diabetes Yes    Intervention Provide education about signs/symptoms and action to take for hypo/hyperglycemia.;Provide education about proper nutrition, including hydration, and aerobic/resistive exercise prescription along with prescribed medications to achieve blood glucose in normal ranges: Fasting glucose 65-99 mg/dL    Expected Outcomes Short Term: Participant verbalizes understanding of the signs/symptoms and immediate care of hyper/hypoglycemia, proper foot care and importance of medication, aerobic/resistive exercise and nutrition plan for blood glucose control.;Long Term: Attainment of HbA1C < 7%.    Hypertension Yes    Intervention Provide education on lifestyle modifcations including regular physical activity/exercise, weight management, moderate sodium restriction and increased consumption of fresh fruit, vegetables, and low fat dairy, alcohol moderation, and smoking cessation.;Monitor prescription  use compliance.    Expected Outcomes Short Term: Continued assessment and intervention until BP is < 140/73mm HG in hypertensive participants. < 130/59mm HG in hypertensive participants with diabetes, heart failure or chronic kidney disease.;Long Term: Maintenance of blood pressure at goal levels.    Lipids Yes    Intervention Provide education and support for participant on nutrition & aerobic/resistive exercise along with prescribed medications to achieve LDL 70mg , HDL >40mg .    Expected Outcomes Long Term: Cholesterol controlled with medications as prescribed, with individualized exercise RX and with personalized nutrition plan. Value goals: LDL < 70mg , HDL > 40 mg.;Short Term: Participant states understanding of desired cholesterol values and is compliant with medications prescribed. Participant is following exercise prescription and nutrition guidelines.             Core Components/Risk Factors/Patient Goals Review:    Core Components/Risk Factors/Patient Goals at Discharge (Final Review):    ITP Comments:  ITP Comments     Row Name 11/03/22 1044           ITP Comments Dr. Armanda Magic medical director. Introduction to [facility] education/ intensive cardiac rehab. Initial orientation packet reviewed with patient.                Comments: Participant attended orientation for the cardiac rehabilitation program on  11/03/2022  to perform initial intake and exercise walk test. Patient introduced to the [facility] education and orientation packet was reviewed. Completed 6-minute walk test, measurements, initial ITP, and exercise prescription. Vital signs stable. Telemetry-normal sinus rhythm AV paced, asymptomatic.   Service time was from 1023 to 1159.   [name], MS 11/03/2022 12:40 PM

## 2022-11-11 ENCOUNTER — Ambulatory Visit (HOSPITAL_COMMUNITY): Payer: Medicare HMO

## 2022-11-11 ENCOUNTER — Encounter (HOSPITAL_COMMUNITY)
Admission: RE | Admit: 2022-11-11 | Discharge: 2022-11-11 | Disposition: A | Discharge status: Home / Self Care | Payer: Medicare HMO | Source: Ambulatory Visit | Attending: Cardiology | Admitting: Cardiology

## 2022-11-11 DIAGNOSIS — Z9861 Coronary angioplasty status: Secondary | ICD-10-CM

## 2022-11-11 DIAGNOSIS — I214 Non-ST elevation (NSTEMI) myocardial infarction: Secondary | ICD-10-CM

## 2022-11-11 DIAGNOSIS — Z951 Presence of aortocoronary bypass graft: Secondary | ICD-10-CM | POA: Diagnosis not present

## 2022-11-11 LAB — GLUCOSE, CAPILLARY
Glucose-Capillary: 159 mg/dL — ABNORMAL HIGH (ref 70–99)
Glucose-Capillary: 128 mg/dL — ABNORMAL HIGH (ref 70–99)

## 2022-11-11 NOTE — Progress Notes (Addendum)
Daily Session Note  Patient Details  Name: Shane Ford MRN: [id number] Date of Birth: 05/19/1946 Referring Provider:   Flowsheet Row INTENSIVE CARDIAC REHAB ORIENT from 11/03/2022 in [facility]  Referring Provider Donato Schultz, MD       Encounter Date: 11/11/2022  Check In:  Session Check In - 11/11/22 1032       Check-In   Supervising physician immediately available to respond to emergencies [facility] - Physician supervision    Physician(s) Eligha Bridegroom, NP    Location [facility]    Staff Present Valinda Party, MS, Exercise Physiologist;Alonnie Bieker, RN, Marton Redwood, MS, ACSM-CEP, CCRP, Exercise Physiologist;Jetta Dan Humphreys BS, ACSM-CEP, Exercise Physiologist;Johnny Hale Bogus, MS, Exercise Physiologist;Sarah Cleophas Dunker, RN, MSN    Virtual Visit No    Medication changes reported     No    Fall or balance concerns reported    No    Tobacco Cessation No Change    Warm-up and Cool-down Performed as group-led instruction   CRP2 orientation   Resistance Training Performed Yes    VAD Patient? No    PAD/SET Patient? No      Pain Assessment   Currently in Pain? No/denies    Pain Score 0-No pain    Multiple Pain Sites No             Capillary Blood Glucose: Results for orders placed or performed during the hospital encounter of 11/11/22 (from the past 24 hour(s))  Glucose, capillary     Status: Abnormal   Collection Time: 11/11/22 10:24 AM  Result Value Ref Range   Glucose-Capillary 159 (H) 70 - 99 mg/dL  Glucose, capillary     Status: Abnormal   Collection Time: 11/11/22 11:12 AM  Result Value Ref Range   Glucose-Capillary 128 (H) 70 - 99 mg/dL     Exercise Prescription Changes - 11/11/22 1400       Response to Exercise   Blood Pressure (Admit) 110/70    Blood Pressure (Exercise) 140/80    Blood Pressure (Exit) 108/62    Heart Rate (Admit) 63 bpm    Heart Rate (Exercise) 105 bpm     Heart Rate (Exit) 72 bpm    Rating of Perceived Exertion (Exercise) 11.5    Perceived Dyspnea (Exercise) 0    Symptoms none    Comments Pt first day in CRP2    Duration Continue with 30 min of aerobic exercise without signs/symptoms of physical distress.    Intensity THRR unchanged      Progression   Progression Continue to progress workloads to maintain intensity without signs/symptoms of physical distress.    Average METs 2.59      Resistance Training   Training Prescription Yes    Weight 3 lb    Reps 10-15      NuStep   Level 1    SPM 84    Minutes 15    METs 2.4      Track   Laps 14    Minutes 15    METs 2.79             Social History   Tobacco Use  Smoking Status Never  Smokeless Tobacco Never    Goals Met:  Exercise tolerated well No report of concerns or symptoms today Strength training completed today  Goals Unmet:  Not Applicable  Comments: Pt started cardiac rehab today.  Pt tolerated light exercise without difficulty. VSS, telemetry-V paced,  asymptomatic.  Medication list reconciled. Pt denies barriers to medicaiton compliance.  PSYCHOSOCIAL ASSESSMENT:  PHQ-3. Pt exhibits positive coping skills, hopeful outlook with supportive family. No psychosocial needs identified at this time, no psychosocial interventions necessary. Larita Fife says the antidepressant his is taking is controlling his depression. Larita Fife says he has had counseling previously denies the need for counseling now.    Pt enjoys gardening, road trips and travelling.   Pt oriented to exercise equipment and routine.    Understanding verbalized.Thayer Headings RN BSN    Dr. Armanda Magic is Medical Director for Cardiac Rehab at [facility].

## 2022-11-14 ENCOUNTER — Ambulatory Visit (HOSPITAL_COMMUNITY): Payer: Medicare HMO

## 2022-11-14 ENCOUNTER — Encounter (HOSPITAL_COMMUNITY)
Admission: RE | Admit: 2022-11-14 | Discharge: 2022-11-14 | Disposition: A | Discharge status: Home / Self Care | Payer: Medicare HMO | Source: Ambulatory Visit | Attending: Cardiology | Admitting: Cardiology

## 2022-11-14 DIAGNOSIS — Z9861 Coronary angioplasty status: Secondary | ICD-10-CM

## 2022-11-14 DIAGNOSIS — I214 Non-ST elevation (NSTEMI) myocardial infarction: Secondary | ICD-10-CM

## 2022-11-14 DIAGNOSIS — Z951 Presence of aortocoronary bypass graft: Secondary | ICD-10-CM

## 2022-11-14 LAB — GLUCOSE, CAPILLARY
Glucose-Capillary: 124 mg/dL — ABNORMAL HIGH (ref 70–99)
Glucose-Capillary: 81 mg/dL (ref 70–99)
Glucose-Capillary: 89 mg/dL (ref 70–99)

## 2022-11-16 ENCOUNTER — Encounter (HOSPITAL_COMMUNITY)
Admission: RE | Admit: 2022-11-16 | Discharge: 2022-11-16 | Disposition: A | Discharge status: Home / Self Care | Payer: Medicare HMO | Source: Ambulatory Visit | Attending: Cardiology | Admitting: Cardiology

## 2022-11-16 ENCOUNTER — Ambulatory Visit (HOSPITAL_COMMUNITY): Payer: Medicare HMO

## 2022-11-16 DIAGNOSIS — Z9861 Coronary angioplasty status: Secondary | ICD-10-CM | POA: Diagnosis not present

## 2022-11-16 DIAGNOSIS — Z951 Presence of aortocoronary bypass graft: Secondary | ICD-10-CM | POA: Diagnosis not present

## 2022-11-16 DIAGNOSIS — I214 Non-ST elevation (NSTEMI) myocardial infarction: Secondary | ICD-10-CM | POA: Diagnosis not present

## 2022-11-16 LAB — GLUCOSE, CAPILLARY: Glucose-Capillary: 154 mg/dL — ABNORMAL HIGH (ref 70–99)

## 2022-11-16 NOTE — Progress Notes (Signed)
Remote pacemaker transmission.   

## 2022-11-17 DIAGNOSIS — N1831 Chronic kidney disease, stage 3a: Secondary | ICD-10-CM | POA: Diagnosis not present

## 2022-11-17 DIAGNOSIS — E1151 Type 2 diabetes mellitus with diabetic peripheral angiopathy without gangrene: Secondary | ICD-10-CM | POA: Diagnosis not present

## 2022-11-17 DIAGNOSIS — Z125 Encounter for screening for malignant neoplasm of prostate: Secondary | ICD-10-CM | POA: Diagnosis not present

## 2022-11-18 ENCOUNTER — Encounter (HOSPITAL_COMMUNITY)
Admission: RE | Admit: 2022-11-18 | Discharge: 2022-11-18 | Disposition: A | Discharge status: Home / Self Care | Payer: Medicare HMO | Source: Ambulatory Visit | Attending: Cardiology | Admitting: Cardiology

## 2022-11-18 ENCOUNTER — Ambulatory Visit (HOSPITAL_COMMUNITY): Payer: Medicare HMO

## 2022-11-18 DIAGNOSIS — Z9861 Coronary angioplasty status: Secondary | ICD-10-CM | POA: Diagnosis not present

## 2022-11-18 DIAGNOSIS — I214 Non-ST elevation (NSTEMI) myocardial infarction: Secondary | ICD-10-CM | POA: Diagnosis not present

## 2022-11-18 DIAGNOSIS — Z951 Presence of aortocoronary bypass graft: Secondary | ICD-10-CM | POA: Diagnosis not present

## 2022-11-18 LAB — GLUCOSE, CAPILLARY
Glucose-Capillary: 145 mg/dL — ABNORMAL HIGH (ref 70–99)
Glucose-Capillary: 110 mg/dL — ABNORMAL HIGH (ref 70–99)

## 2022-11-23 ENCOUNTER — Encounter (HOSPITAL_COMMUNITY)
Admission: RE | Admit: 2022-11-23 | Discharge: 2022-11-23 | Disposition: A | Discharge status: Home / Self Care | Payer: Medicare HMO | Source: Ambulatory Visit | Attending: Cardiology | Admitting: Cardiology

## 2022-11-23 ENCOUNTER — Ambulatory Visit (HOSPITAL_COMMUNITY): Payer: Medicare HMO

## 2022-11-23 DIAGNOSIS — I214 Non-ST elevation (NSTEMI) myocardial infarction: Secondary | ICD-10-CM | POA: Diagnosis not present

## 2022-11-23 DIAGNOSIS — Z9861 Coronary angioplasty status: Secondary | ICD-10-CM

## 2022-11-23 DIAGNOSIS — Z951 Presence of aortocoronary bypass graft: Secondary | ICD-10-CM | POA: Diagnosis not present

## 2022-11-24 DIAGNOSIS — I2510 Atherosclerotic heart disease of native coronary artery without angina pectoris: Secondary | ICD-10-CM | POA: Diagnosis not present

## 2022-11-24 DIAGNOSIS — N183 Chronic kidney disease, stage 3 unspecified: Secondary | ICD-10-CM | POA: Diagnosis not present

## 2022-11-24 DIAGNOSIS — Z1331 Encounter for screening for depression: Secondary | ICD-10-CM | POA: Diagnosis not present

## 2022-11-24 DIAGNOSIS — Z0000 Encounter for general adult medical examination without abnormal findings: Secondary | ICD-10-CM | POA: Diagnosis not present

## 2022-11-24 DIAGNOSIS — F03A3 Unspecified dementia, mild, with mood disturbance: Secondary | ICD-10-CM | POA: Diagnosis not present

## 2022-11-24 DIAGNOSIS — E1122 Type 2 diabetes mellitus with diabetic chronic kidney disease: Secondary | ICD-10-CM | POA: Diagnosis not present

## 2022-11-24 DIAGNOSIS — F330 Major depressive disorder, recurrent, mild: Secondary | ICD-10-CM | POA: Insufficient documentation

## 2022-11-24 DIAGNOSIS — F32A Depression, unspecified: Secondary | ICD-10-CM | POA: Diagnosis not present

## 2022-11-25 ENCOUNTER — Encounter (HOSPITAL_COMMUNITY)
Admission: RE | Admit: 2022-11-25 | Discharge: 2022-11-25 | Disposition: A | Discharge status: Home / Self Care | Payer: Medicare HMO | Source: Ambulatory Visit | Attending: Cardiology | Admitting: Cardiology

## 2022-11-25 ENCOUNTER — Ambulatory Visit (HOSPITAL_COMMUNITY): Payer: Medicare HMO

## 2022-11-25 DIAGNOSIS — Z9861 Coronary angioplasty status: Secondary | ICD-10-CM

## 2022-11-25 DIAGNOSIS — I214 Non-ST elevation (NSTEMI) myocardial infarction: Secondary | ICD-10-CM | POA: Diagnosis not present

## 2022-11-25 DIAGNOSIS — Z951 Presence of aortocoronary bypass graft: Secondary | ICD-10-CM

## 2022-11-28 ENCOUNTER — Ambulatory Visit (HOSPITAL_COMMUNITY): Payer: Medicare HMO

## 2022-11-28 ENCOUNTER — Encounter (HOSPITAL_COMMUNITY)
Admission: RE | Admit: 2022-11-28 | Discharge: 2022-11-28 | Disposition: A | Discharge status: Home / Self Care | Payer: Medicare HMO | Source: Ambulatory Visit | Attending: Cardiology | Admitting: Cardiology

## 2022-11-28 DIAGNOSIS — Z9861 Coronary angioplasty status: Secondary | ICD-10-CM | POA: Insufficient documentation

## 2022-11-28 DIAGNOSIS — I214 Non-ST elevation (NSTEMI) myocardial infarction: Secondary | ICD-10-CM | POA: Insufficient documentation

## 2022-11-28 DIAGNOSIS — Z951 Presence of aortocoronary bypass graft: Secondary | ICD-10-CM | POA: Insufficient documentation

## 2022-11-29 DIAGNOSIS — E113393 Type 2 diabetes mellitus with moderate nonproliferative diabetic retinopathy without macular edema, bilateral: Secondary | ICD-10-CM | POA: Diagnosis not present

## 2022-11-29 NOTE — Progress Notes (Signed)
Cardiac Individual Treatment Plan  Patient Details  Name: Shane Ford MRN: [id number] Date of Birth: November 25, 1945 Referring Provider:   Flowsheet Row INTENSIVE CARDIAC REHAB ORIENT from 11/03/2022 in [facility]  Referring Provider Donato Schultz, MD       Initial Encounter Date:  Flowsheet Row INTENSIVE CARDIAC REHAB ORIENT from 11/03/2022 in [facility]  Date 11/03/22       Visit Diagnosis: 10/22/22 NSTEMI (non-ST elevated myocardial infarction) (HCC)  10/22/22 S/P PTCA (percutaneous transluminal coronary angioplasty)  7/26 CABG x 4  Patient's Home Medications on Admission:  Current Outpatient Medications:    acetaminophen (TYLENOL) 500 MG tablet, Take 1,000 mg by mouth every 6 (six) hours as needed for moderate pain., Disp: , Rfl:    aspirin EC 81 MG tablet, Take 81 mg by mouth in the morning. Swallow whole., Disp: , Rfl:    atorvastatin (LIPITOR) 80 MG tablet, Take 1 tablet (80 mg total) by mouth daily., Disp: 30 tablet, Rfl: 3   Cholecalciferol 50 MCG (2000 UT) CAPS, Take 2,000 Units by mouth in the morning., Disp: , Rfl:    docusate sodium (COLACE) 100 MG capsule, Take 100 mg by mouth daily as needed for mild constipation., Disp: , Rfl:    donepezil (ARICEPT) 10 MG tablet, Take 10 mg by mouth at bedtime., Disp: , Rfl:    empagliflozin (JARDIANCE) 10 MG TABS tablet, Take 1 tablet (10 mg total) by mouth daily before breakfast., Disp: 30 tablet, Rfl: 3   fenofibrate 160 MG tablet, Take 160 mg by mouth in the morning., Disp: , Rfl:    glimepiride (AMARYL) 1 MG tablet, Take 1 mg by mouth in the morning and at bedtime., Disp: , Rfl:    metFORMIN (GLUCOPHAGE) 500 MG tablet, Take 500 mg by mouth 2 (two) times daily with a meal., Disp: , Rfl:    methotrexate (RHEUMATREX) 2.5 MG tablet, Take 12.5 mg by mouth every Wednesday. Take 6 tablets (12.5mg ) once a week on  Wednesdays. Caution:Chemotherapy. Protect from light., Disp: , Rfl:    metoprolol tartrate (LOPRESSOR) 25 MG tablet, Take 0.5 tablets (12.5 mg total) by mouth 2 (two) times daily., Disp: 30 tablet, Rfl: 2   mirtazapine (REMERON) 15 MG tablet, Take 15 mg by mouth at bedtime., Disp: , Rfl:    Polyethyl Glycol-Propyl Glycol (SYSTANE) 0.4-0.3 % SOLN, Apply 1 drop to eye 2 (two) times daily as needed., Disp: , Rfl:    potassium chloride SA (KLOR-CON M) 20 MEQ tablet, Take 2 tablets (40 mEq total) by mouth once for 1 dose. On 4/29 take 2 tablets ( total) once., Disp: 2 tablet, Rfl: 0   ticagrelor (BRILINTA) 90 MG TABS tablet, Take 1 tablet (90 mg total) by mouth 2 (two) times daily., Disp: 60 tablet, Rfl: 3   vitamin B-12 (CYANOCOBALAMIN) 1000 MCG tablet, Take 1,000 mcg by mouth daily., Disp: , Rfl:   Past Medical History: Past Medical History:  Diagnosis Date   Arthritis    pt states he was diagnosed 10+ years ago; psoriatic arthritis   Coronary artery disease    Depression    pt diagnosed 10+ years ago   Diabetes mellitus (HCC)    Dysrhythmia 01/17/2021   first degree AV block, 2nd degree AVB, Mobitz I in setting of MI   Hyperlipidemia    Hypertension     Tobacco Use: Social History   Tobacco Use  Smoking Status Never  Smokeless Tobacco Never    Labs: Review Flowsheet  More data may exist      Latest Ref Rng & Units 01/17/2021 01/18/2021 01/19/2021 05/04/2021 10/22/2022  Labs for ITP Cardiac and Pulmonary Rehab  Cholestrol 0 - 200 mg/dL 161  - - - 096   LDL (calc) 0 - 99 mg/dL 71  - - - 67   HDL-C >04 mg/dL 45  - - - 48   Trlycerides <150 mg/dL 540  - - - 87   Hemoglobin A1c 4.8 - 5.6 % - 8.5  - 6.5  6.8   PH, Arterial 7.350 - 7.450 - - 7.369  7.335  7.370  7.455  7.375  7.350  - -  PCO2 arterial 32.0 - 48.0 mmHg - - 38.2  42.6  38.1  38.0  48.4  48.8  - -  Bicarbonate 20.0 - 28.0 mmol/L - - 21.9  22.7  22.0  26.8  26.4  28.3  27.0  - -  TCO2 22 - 32 mmol/L - - 23  24  23  21   27  28  26  28  30  20  28  28   - -  Acid-base deficit 0.0 - 2.0 mmol/L - - 3.0  3.0  3.0  - -  O2 Saturation % - - 98.0  99.0  100.0  100.0  85.0  100.0  100.0  - -    Capillary Blood Glucose: Lab Results  Component Value Date   GLUCAP 110 (H) 11/18/2022   GLUCAP 145 (H) 11/18/2022   GLUCAP 154 (H) 11/16/2022   GLUCAP 89 11/14/2022   GLUCAP 81 11/14/2022     Exercise Target Goals: Exercise Program Goal: Individual exercise prescription set using results from initial 6 min walk test and THRR while considering  patient's activity barriers and safety.   Exercise Prescription Goal: Initial exercise prescription builds to 30-45 minutes a day of aerobic activity, 2-3 days per week.  Home exercise guidelines will be given to patient during program as part of exercise prescription that the participant will acknowledge.  Activity Barriers & Risk Stratification:  Activity Barriers & Cardiac Risk Stratification - 11/03/22 1225       Activity Barriers & Cardiac Risk Stratification   Activity Barriers Arthritis;Back Problems;Neck/Spine Problems;Joint Problems;Shortness of Breath;Balance Concerns    Cardiac Risk Stratification High   <5 METS on            6 Minute Walk:  6 Minute Walk     Row Name 11/03/22 1224         6 Minute Walk   Phase Initial     Distance 1200 feet     Walk Time 6 minutes     # of Rest Breaks 0     MPH 2.27     METS 2.76     RPE 11     Perceived Dyspnea  1     VO2 Peak 9.65     Symptoms Yes (comment)     Comments mild SOB, resolved with rest. no pain     Resting HR 61 bpm     Resting BP 104/56     Resting Oxygen Saturation  98 %     Exercise Oxygen Saturation  during 6 min walk 98 %     Max Ex. HR 97 bpm     Max Ex. BP 120/58     2 Minute Post BP 110/60  Oxygen Initial Assessment:   Oxygen Re-Evaluation:   Oxygen Discharge (Final Oxygen Re-Evaluation):   Initial Exercise Prescription:  Initial Exercise  Prescription - 11/03/22 1200       Date of Initial Exercise RX and Referring Provider   Date 11/03/22    Referring Provider Donato Schultz, MD    Expected Discharge Date 01/13/23      NuStep   Level 1    SPM 75    Minutes 15    METs 2      Track   Laps 18    Minutes 15    METs 2.5      Prescription Details   Frequency (times per week) 3    Duration Progress to 30 minutes of continuous aerobic without signs/symptoms of physical distress      Intensity   THRR 40-80% of Max Heartrate 58-115    Ratings of Perceived Exertion 11-13    Perceived Dyspnea 0-4      Progression   Progression Continue progressive overload as per policy without signs/symptoms or physical distress.      Resistance Training   Training Prescription Yes    Weight 3 lbs    Reps 10-15             Perform Capillary Blood Glucose checks as needed.  Exercise Prescription Changes:   Exercise Prescription Changes     Row Name 11/11/22 1400 11/28/22 1008           Response to Exercise   Blood Pressure (Admit) 110/70 120/58      Blood Pressure (Exercise) 140/80 124/74      Blood Pressure (Exit) 108/62 102/68      Heart Rate (Admit) 63 bpm 65 bpm      Heart Rate (Exercise) 105 bpm 116 bpm      Heart Rate (Exit) 72 bpm 74 bpm      Rating of Perceived Exertion (Exercise) 11.5 12      Perceived Dyspnea (Exercise) 0 0      Symptoms none None      Comments Pt first day in CRP2 Reviewed METs with patient.      Duration Continue with 30 min of aerobic exercise without signs/symptoms of physical distress. Continue with 30 min of aerobic exercise without signs/symptoms of physical distress.      Intensity THRR unchanged THRR unchanged        Progression   Progression Continue to progress workloads to maintain intensity without signs/symptoms of physical distress. Continue to progress workloads to maintain intensity without signs/symptoms of physical distress.      Average METs 2.59 3.3         Resistance Training   Training Prescription Yes Yes      Weight 3 lb 3 lb      Reps 10-15 10-15      Time -- 10 Minutes        Interval Training   Interval Training -- No        NuStep   Level 1 1      SPM 84 105      Minutes 15 15      METs 2.4 3.1        Track   Laps 14 20  220 feet/lap      Minutes 15 15      METs 2.79 3.55               Exercise Comments:   Exercise Comments  Row Name 11/11/22 1406 11/28/22 1109         Exercise Comments Pt first day in CRP2 program. Pt tolerated exercise well and is learning his exercise parameters. Will continue to progress workloads as tolerated. Reviewed METs with patient.               Exercise Goals and Review:   Exercise Goals     Row Name 11/03/22 1054             Exercise Goals   Increase Physical Activity Yes       Intervention Provide advice, education, support and counseling about physical activity/exercise needs.;Develop an individualized exercise prescription for aerobic and resistive training based on initial evaluation findings, risk stratification, comorbidities and participant's personal goals.       Expected Outcomes Short Term: Attend rehab on a regular basis to increase amount of physical activity.;Long Term: Exercising regularly at least 3-5 days a week.;Long Term: Add in home exercise to make exercise part of routine and to increase amount of physical activity.       Increase Strength and Stamina Yes       Intervention Provide advice, education, support and counseling about physical activity/exercise needs.;Develop an individualized exercise prescription for aerobic and resistive training based on initial evaluation findings, risk stratification, comorbidities and participant's personal goals.       Expected Outcomes Short Term: Increase workloads from initial exercise prescription for resistance, speed, and METs.;Short Term: Perform resistance training exercises routinely during rehab and add in  resistance training at home;Long Term: Improve cardiorespiratory fitness, muscular endurance and strength as measured by increased METs and functional capacity ( )       Able to understand and use rate of perceived exertion (RPE) scale Yes       Intervention Provide education and explanation on how to use RPE scale       Expected Outcomes Short Term: Able to use RPE daily in rehab to express subjective intensity level;Long Term:  Able to use RPE to guide intensity level when exercising independently       Knowledge and understanding of Target Heart Rate Range (THRR) Yes       Intervention Provide education and explanation of THRR including how the numbers were predicted and where they are located for reference       Expected Outcomes Short Term: Able to state/look up THRR;Short Term: Able to use daily as guideline for intensity in rehab;Long Term: Able to use THRR to govern intensity when exercising independently       Understanding of Exercise Prescription Yes       Intervention Provide education, explanation, and written materials on patient's individual exercise prescription       Expected Outcomes Short Term: Able to explain program exercise prescription;Long Term: Able to explain home exercise prescription to exercise independently                Exercise Goals Re-Evaluation :  Exercise Goals Re-Evaluation     Row Name 11/11/22 1405             Exercise Goal Re-Evaluation   Exercise Goals Review Increase Strength and Stamina;Increase Physical Activity;Able to understand and use rate of perceived exertion (RPE) scale;Knowledge and understanding of Target Heart Rate Range (THRR);Understanding of Exercise Prescription       Comments Pt first day in CRP2 program. Pt tolerated exercise well with an avg MET level of 2.59. Pt is learning his THRR and RPE scale. Pt understands his  ExRx. Pt was encouraged to focus on picking up his feet during walking track to avoid shuffled gait.        Expected Outcomes Will continue to progress workloads as tolerated without s/sx.                Discharge Exercise Prescription (Final Exercise Prescription Changes):  Exercise Prescription Changes - 11/28/22 1008       Response to Exercise   Blood Pressure (Admit) 120/58    Blood Pressure (Exercise) 124/74    Blood Pressure (Exit) 102/68    Heart Rate (Admit) 65 bpm    Heart Rate (Exercise) 116 bpm    Heart Rate (Exit) 74 bpm    Rating of Perceived Exertion (Exercise) 12    Perceived Dyspnea (Exercise) 0    Symptoms None    Comments Reviewed METs with patient.    Duration Continue with 30 min of aerobic exercise without signs/symptoms of physical distress.    Intensity THRR unchanged      Progression   Progression Continue to progress workloads to maintain intensity without signs/symptoms of physical distress.    Average METs 3.3      Resistance Training   Training Prescription Yes    Weight 3 lb    Reps 10-15    Time 10 Minutes      Interval Training   Interval Training No      NuStep   Level 1    SPM 105    Minutes 15    METs 3.1      Track   Laps 20   220 feet/lap   Minutes 15    METs 3.55             Nutrition:  Target Goals: Understanding of nutrition guidelines, daily intake of sodium 1500mg , cholesterol 200mg , calories 30% from fat and 7% or less from saturated fats, daily to have 5 or more servings of fruits and vegetables.  Biometrics:  Pre Biometrics - 11/03/22 1030       Pre Biometrics   Waist Circumference 38 inches    Hip Circumference 38.25 inches    Waist to Hip Ratio 0.99 %    Triceps Skinfold 24 mm    % Body Fat 26.5 %    Grip Strength 38 kg    Flexibility 0 in   could not reach   Single Leg Stand 4.68 seconds              Nutrition Therapy Plan and Nutrition Goals:  Nutrition Therapy & Goals - 11/11/22 1130       Nutrition Therapy   Diet Heart healthy/carbohydrate consistent diet    Drug/Food Interactions  Statins/Certain Fruits      Personal Nutrition Goals   Nutrition Goal Patient to identify strategies for reducing cardiovascular risk by attending the [employer] education and nutrition series weekly.    Personal Goal #2 Patient to improve diet quality by using the plate method as a guide for meal planning to include lean protein/plant protein, fruits, vegetables, whole grains, nonfat dairy as part of a well-balanced diet.    Comments Larita Fife has previously completed cardiac rehab in 2022. Patient will benefit from participation in intensive cardiac rehab for nutrition, exercise, and lifestyle modification.      Intervention Plan   Intervention Prescribe, educate and counsel regarding individualized specific dietary modifications aiming towards targeted core components such as weight, hypertension, lipid management, diabetes, heart failure and other comorbidities.;Nutrition handout(s) given to patient.  Expected Outcomes Short Term Goal: Understand basic principles of dietary content, such as calories, fat, sodium, cholesterol and nutrients.;Long Term Goal: Adherence to prescribed nutrition plan.             Nutrition Assessments:  Nutrition Assessments - 11/14/22 1139       Rate Your Plate Scores   Pre Score 68            MEDIFICTS Score Key: ?70 Need to make dietary changes  40-70 Heart Healthy Diet ? 40 Therapeutic Level Cholesterol Diet   Flowsheet Row INTENSIVE CARDIAC REHAB from 11/14/2022 in [facility]  Picture Your Plate Total Score on Admission 68      Picture Your Plate Scores: <09 Unhealthy dietary pattern with much room for improvement. 41-50 Dietary pattern unlikely to meet recommendations for good health and room for improvement. 51-60 More healthful dietary pattern, with some room for improvement.  >60 Healthy dietary pattern, although there may be some specific behaviors that could be improved.     Nutrition Goals Re-Evaluation:  Nutrition Goals Re-Evaluation     Row Name 11/11/22 1130             Goals   Current Weight 180 lb 5.4 oz (81.8 kg)       Comment lipoproteinA WNL, lipids WNL, A1c 6.8       Expected Outcome Patient will benefit from participation in intensive cardiac rehab for nutrition, exercise, and lifestyle modification.                Nutrition Goals Re-Evaluation:  Nutrition Goals Re-Evaluation     Row Name 11/11/22 1130             Goals   Current Weight 180 lb 5.4 oz (81.8 kg)       Comment lipoproteinA WNL, lipids WNL, A1c 6.8       Expected Outcome Patient will benefit from participation in intensive cardiac rehab for nutrition, exercise, and lifestyle modification.                Nutrition Goals Discharge (Final Nutrition Goals Re-Evaluation):  Nutrition Goals Re-Evaluation - 11/11/22 1130       Goals   Current Weight 180 lb 5.4 oz (81.8 kg)    Comment lipoproteinA WNL, lipids WNL, A1c 6.8    Expected Outcome Patient will benefit from participation in intensive cardiac rehab for nutrition, exercise, and lifestyle modification.             Psychosocial: Target Goals: Acknowledge presence or absence of significant depression and/or stress, maximize coping skills, provide positive support system. Participant is able to verbalize types and ability to use techniques and skills needed for reducing stress and depression.  Initial Review & Psychosocial Screening:  Initial Psych Review & Screening - 11/03/22 1233       Initial Review   Current issues with Current Depression;Current Anxiety/Panic;Current Psychotropic Meds      Family Dynamics   Good Support System? Yes   Larita Fife has his family for support   Comments Larita Fife is currently on medication for depression and anxiety. He shared that he feels this medication is working for him currently and is happy with his regime. He has been managing his mental health with his PCP of  almost 20 years and is happy with his care. He stated that he still has some bad days, but nothing "more than he can handle". Larita Fife was reassured that we have  resources should he need additional help.      Barriers   Psychosocial barriers to participate in program The patient should benefit from training in stress management and relaxation.      Screening Interventions   Interventions Encouraged to exercise;Provide feedback about the scores to participant    Expected Outcomes Short Term goal: Identification and review with participant of any Quality of Life or Depression concerns found by scoring the questionnaire.;Long Term goal: The participant improves quality of Life and PHQ9 Scores as seen by post scores and/or verbalization of changes;Long Term Goal: Stressors or current issues are controlled or eliminated.             Quality of Life Scores:  Quality of Life - 11/03/22 1236       Quality of Life   Select Quality of Life      Quality of Life Scores   Health/Function Pre 22.7 %    Socioeconomic Pre 27.21 %    Psych/Spiritual Pre 24.64 %    Family Pre 28.8 %    GLOBAL Pre 24.93 %            Scores of 19 and below usually indicate a poorer quality of life in these areas.  A difference of  2-3 points is a clinically meaningful difference.  A difference of 2-3 points in the total score of the Quality of Life Index has been associated with significant improvement in overall quality of life, self-image, physical symptoms, and general health in studies assessing change in quality of life.  PHQ-9: Review Flowsheet       11/03/2022 04/13/2021  Depression screen PHQ 2/9  Decreased Interest 0 0  Down, Depressed, Hopeless 1 0  PHQ - 2 Score 1 0  Altered sleeping 0 -  Tired, decreased energy 0 -  Change in appetite 0 -  Feeling bad or failure about yourself  1 -  Trouble concentrating 1 -  Moving slowly or fidgety/restless 0 -  Suicidal thoughts 0 -  PHQ-9 Score 3 -   Difficult doing work/chores Somewhat difficult -   Interpretation of Total Score  Total Score Depression Severity:  1-4 = Minimal depression, 5-9 = Mild depression, 10-14 = Moderate depression, 15-19 = Moderately severe depression, 20-27 = Severe depression   Psychosocial Evaluation and Intervention:   Psychosocial Re-Evaluation:  Psychosocial Re-Evaluation     Row Name 11/29/22 1348             Psychosocial Re-Evaluation   Current issues with Current Depression;History of Depression;Current Psychotropic Meds       Comments Larita Fife has not voiced any increased concerns or stressors at intensive cardiac rehab.       Interventions Stress management education;Encouraged to attend Cardiac Rehabilitation for the exercise;Relaxation education       Continue Psychosocial Services  No Follow up required                Psychosocial Discharge (Final Psychosocial Re-Evaluation):  Psychosocial Re-Evaluation - 11/29/22 1348       Psychosocial Re-Evaluation   Current issues with Current Depression;History of Depression;Current Psychotropic Meds    Comments Larita Fife has not voiced any increased concerns or stressors at intensive cardiac rehab.    Interventions Stress management education;Encouraged to attend Cardiac Rehabilitation for the exercise;Relaxation education    Continue Psychosocial Services  No Follow up required             Vocational Rehabilitation: Provide vocational rehab assistance to qualifying candidates.  Vocational Rehab Evaluation & Intervention:  Vocational Rehab - 11/03/22 1055       Initial Vocational Rehab Evaluation & Intervention   Assessment shows need for Vocational Rehabilitation No   Laik is retired            Education: Education Goals: Education classes will be provided on a weekly basis, covering required topics. Participant will state understanding/return demonstration of topics presented.    Education     Row Name 11/11/22 1300      Education   Cardiac Education Topics Pritikin   Nurse, children's Exercise Physiologist   Select Psychosocial   Psychosocial How Our Thoughts Can Heal Our Hearts   Instruction Review Code 1- Verbalizes Understanding   Class Start Time 1150   Class Stop Time 1226   Class Time Calculation (min) 36 min    Row Name 11/14/22 1200     Education   Cardiac Education Topics [employer]ritikin   Geographical information systems officer Psychosocial   Psychosocial Workshop From Head to Heart: The Power of a Healthy Outlook   Instruction Review Code 1- Verbalizes Understanding   Class Start Time 1153   Class Stop Time 1237   Class Time Calculation (min) 44 min    Row Name 11/16/22 1300     Education   Cardiac Education Topics Pritikin   Customer service manager   Weekly Topic Powerhouse Plant-Based Proteins   Instruction Review Code 1- Verbalizes Understanding   Class Start Time 1135   Class Stop Time 1210   Class Time Calculation (min) 35 min    Row Name 11/18/22 1400     Education   Cardiac Education Topics Pritikin   Psychologist, forensic General Education   General Education Hypertension and Heart Disease   Instruction Review Code 1- Verbalizes Understanding   Class Start Time 1145   Class Stop Time 1220   Class Time Calculation (min) 35 min    Row Name 11/23/22 1300     Education   Cardiac Education Topics Pritikin   Customer service manager   Weekly Topic Adding Flavor - Sodium-Free   Instruction Review Code 1- Verbalizes Understanding   Class Start Time 1138   Class Stop Time 1210   Class Time Calculation (min) 32 min    Row Name 11/25/22 1400     Education   Cardiac Education Topics Pritikin   Careers information officer Education   General Education Heart Disease Risk Reduction   Instruction Review Code 1- Verbalizes Understanding   Class Start Time 1135   Class Stop Time 1210   Class Time Calculation (min) 35 min    Row Name 11/28/22 1300     Education   Cardiac Education Topics Pritikin   Select Workshops     Workshops   Educator Exercise Physiologist   Select Exercise   Exercise Workshop Location manager and Fall Prevention   Instruction Review Code 1- Verbalizes Understanding   Class Start Time 1146   Class Stop Time 1226   Class Time Calculation (min) 40 min  Core Videos: Exercise    Move It!  Clinical staff conducted group or individual video education with verbal and written material and guidebook.  Patient learns the recommended Pritikin exercise program. Exercise with the goal of living a long, healthy life. Some of the health benefits of exercise include controlled diabetes, healthier blood pressure levels, improved cholesterol levels, improved heart and lung capacity, improved sleep, and better body composition. Everyone should speak with their doctor before starting or changing an exercise routine.  Biomechanical Limitations Clinical staff conducted group or individual video education with verbal and written material and guidebook.  Patient learns how biomechanical limitations can impact exercise and how we can mitigate and possibly overcome limitations to have an impactful and balanced exercise routine.  Body Composition Clinical staff conducted group or individual video education with verbal and written material and guidebook.  Patient learns that body composition (ratio of muscle mass to fat mass) is a key component to assessing overall fitness, rather than body weight alone. Increased fat mass, especially visceral belly fat, can put [place] at increased risk for metabolic syndrome, type 2 diabetes, heart disease, and even death. It is recommended to combine  diet and exercise (cardiovascular and resistance training) to improve your body composition. Seek guidance from your physician and exercise physiologist before implementing an exercise routine.  Exercise Action Plan Clinical staff conducted group or individual video education with verbal and written material and guidebook.  Patient learns the recommended strategies to achieve and enjoy long-term exercise adherence, including variety, self-motivation, self-efficacy, and positive decision making. Benefits of exercise include fitness, good health, weight management, more energy, better sleep, less stress, and overall well-being.  Medical   Heart Disease Risk Reduction Clinical staff conducted group or individual video education with verbal and written material and guidebook.  Patient learns our heart is our most vital organ as it circulates oxygen, nutrients, white blood cells, and hormones throughout the entire body, and carries waste away. Data supports a plant-based eating plan like the Pritikin Program for its effectiveness in slowing progression of and reversing heart disease. The video provides a number of recommendations to address heart disease.   Metabolic Syndrome and Belly Fat  Clinical staff conducted group or individual video education with verbal and written material and guidebook.  Patient learns what metabolic syndrome is, how it leads to heart disease, and how one can reverse it and keep it from coming back. You have metabolic syndrome if you have 3 of the following 5 criteria: abdominal obesity, high blood pressure, high triglycerides, low HDL cholesterol, and high blood sugar.  Hypertension and Heart Disease Clinical staff conducted group or individual video education with verbal and written material and guidebook.  Patient learns that high blood pressure, or hypertension, is very common in the [place]. Hypertension is largely due to excessive salt intake, but other important  risk factors include being overweight, physical inactivity, drinking too much alcohol, smoking, and not eating enough potassium from fruits and vegetables. High blood pressure is a leading risk factor for heart attack, stroke, congestive heart failure, dementia, kidney failure, and premature death. Long-term effects of excessive salt intake include stiffening of the arteries and thickening of heart muscle and organ damage. Recommendations include ways to reduce hypertension and the risk of heart disease.  Diseases of Our Time - Focusing on Diabetes Clinical staff conducted group or individual video education with verbal and written material and guidebook.  Patient learns why the best way to stop diseases of our time  is prevention, through food and other lifestyle changes. Medicine (such as prescription pills and surgeries) is often only a Band-Aid on the problem, not a long-term solution. Most common diseases of our time include obesity, type 2 diabetes, hypertension, heart disease, and cancer. The Pritikin Program is recommended and has been proven to help reduce, reverse, and/or prevent the damaging effects of metabolic syndrome.  Nutrition   Overview of the Pritikin Eating Plan  Clinical staff conducted group or individual video education with verbal and written material and guidebook.  Patient learns about the Pritikin Eating Plan for disease risk reduction. The Pritikin Eating Plan emphasizes a wide variety of unrefined, minimally-processed carbohydrates, like fruits, vegetables, whole grains, and legumes. Go, Caution, and Stop food choices are explained. Plant-based and lean animal proteins are emphasized. Rationale provided for low sodium intake for blood pressure control, low added sugars for blood sugar stabilization, and low added fats and oils for coronary artery disease risk reduction and weight management.  Calorie Density  Clinical staff conducted group or individual video education with  verbal and written material and guidebook.  Patient learns about calorie density and how it impacts the Pritikin Eating Plan. Knowing the characteristics of the food you choose will help you decide whether those foods will lead to weight gain or weight loss, and whether you want to consume more or less of them. Weight loss is usually a side effect of the Pritikin Eating Plan because of its focus on low calorie-dense foods.  Label Reading  Clinical staff conducted group or individual video education with verbal and written material and guidebook.  Patient learns about the Pritikin recommended label reading guidelines and corresponding recommendations regarding calorie density, added sugars, sodium content, and whole grains.  Dining Out - Part 1  Clinical staff conducted group or individual video education with verbal and written material and guidebook.  Patient learns that restaurant meals can be sabotaging because they can be so high in calories, fat, sodium, and/or sugar. Patient learns recommended strategies on how to positively address this and avoid unhealthy pitfalls.  Facts on Fats  Clinical staff conducted group or individual video education with verbal and written material and guidebook.  Patient learns that lifestyle modifications can be just as effective, if not more so, as many medications for lowering your risk of heart disease. A Pritikin lifestyle can help to reduce your risk of inflammation and atherosclerosis (cholesterol build-up, or plaque, in the artery walls). Lifestyle interventions such as dietary choices and physical activity address the cause of atherosclerosis. A review of the types of fats and their impact on blood cholesterol levels, along with dietary recommendations to reduce fat intake is also included.  Nutrition Action Plan  Clinical staff conducted group or individual video education with verbal and written material and guidebook.  Patient learns how to incorporate  Pritikin recommendations into their lifestyle. Recommendations include planning and keeping personal health goals in mind as an important part of their success.  Healthy Mind-Set    Healthy Minds, Bodies, Hearts  Clinical staff conducted group or individual video education with verbal and written material and guidebook.  Patient learns how to identify when they are stressed. Video will discuss the impact of that stress, as well as the many benefits of stress management. Patient will also be introduced to stress management techniques. The way we think, act, and feel has an impact on our hearts.  How Our Thoughts Can Heal Our Hearts  Clinical staff conducted group or individual video  education with verbal and written material and guidebook.  Patient learns that negative thoughts can cause depression and anxiety. This can result in negative lifestyle behavior and serious health problems. Cognitive behavioral therapy is an effective method to help control our thoughts in order to change and improve our emotional outlook.  Additional Videos:  Exercise    Improving Performance  Clinical staff conducted group or individual video education with verbal and written material and guidebook.  Patient learns to use a non-linear approach by alternating intensity levels and lengths of time spent exercising to help burn more calories and lose more body fat. Cardiovascular exercise helps improve heart health, metabolism, hormonal balance, blood sugar control, and recovery from fatigue. Resistance training improves strength, endurance, balance, coordination, reaction time, metabolism, and muscle mass. Flexibility exercise improves circulation, posture, and balance. Seek guidance from your physician and exercise physiologist before implementing an exercise routine and learn your capabilities and proper form for all exercise.  Introduction to Yoga  Clinical staff conducted group or individual video education with  verbal and written material and guidebook.  Patient learns about yoga, a discipline of the coming together of mind, breath, and body. The benefits of yoga include improved flexibility, improved range of motion, better posture and core strength, increased lung function, weight loss, and positive self-image. Yoga's heart health benefits include lowered blood pressure, healthier heart rate, decreased cholesterol and triglyceride levels, improved immune function, and reduced stress. Seek guidance from your physician and exercise physiologist before implementing an exercise routine and learn your capabilities and proper form for all exercise.  Medical   Aging: Enhancing Your Quality of Life  Clinical staff conducted group or individual video education with verbal and written material and guidebook.  Patient learns key strategies and recommendations to stay in good physical health and enhance quality of life, such as prevention strategies, having an advocate, securing a Health Care Proxy and Power of Attorney, and keeping a list of medications and system for tracking them. It also discusses how to avoid risk for bone loss.  Biology of Weight Control  Clinical staff conducted group or individual video education with verbal and written material and guidebook.  Patient learns that weight gain occurs because we consume more calories than we burn (eating more, moving less). Even if your body weight is normal, you may have higher ratios of fat compared to muscle mass. Too much body fat puts you at increased risk for cardiovascular disease, heart attack, stroke, type 2 diabetes, and obesity-related cancers. In addition to exercise, following the Pritikin Eating Plan can help reduce your risk.  Decoding Lab Results  Clinical staff conducted group or individual video education with verbal and written material and guidebook.  Patient learns that lab test reflects one measurement whose values change over time and are  influenced by many factors, including medication, stress, sleep, exercise, food, hydration, pre-existing medical conditions, and more. It is recommended to use the knowledge from this video to become more involved with your lab results and evaluate your numbers to speak with your doctor.   Diseases of Our Time - Overview  Clinical staff conducted group or individual video education with verbal and written material and guidebook.  Patient learns that according to the CDC, 50% to 70% of chronic diseases (such as obesity, type 2 diabetes, elevated lipids, hypertension, and heart disease) are avoidable through lifestyle improvements including healthier food choices, listening to satiety cues, and increased physical activity.  Sleep Disorders Clinical staff conducted group or individual  video education with verbal and written material and guidebook.  Patient learns how good quality and duration of sleep are important to overall health and well-being. Patient also learns about sleep disorders and how they impact health along with recommendations to address them, including discussing with a physician.  Nutrition  Dining Out - Part 2 Clinical staff conducted group or individual video education with verbal and written material and guidebook.  Patient learns how to plan ahead and communicate in order to maximize their dining experience in a healthy and nutritious manner. Included are recommended food choices based on the type of restaurant the patient is visiting.   Fueling a Banker conducted group or individual video education with verbal and written material and guidebook.  There is a strong connection between our food choices and our health. Diseases like obesity and type 2 diabetes are very prevalent and are in large-part due to lifestyle choices. The Pritikin Eating Plan provides plenty of food and hunger-curbing satisfaction. It is easy to follow, affordable, and helps reduce  health risks.  Menu Workshop  Clinical staff conducted group or individual video education with verbal and written material and guidebook.  Patient learns that restaurant meals can sabotage health goals because they are often packed with calories, fat, sodium, and sugar. Recommendations include strategies to plan ahead and to communicate with the manager, chef, or server to help order a healthier meal.  Planning Your Eating Strategy  Clinical staff conducted group or individual video education with verbal and written material and guidebook.  Patient learns about the Pritikin Eating Plan and its benefit of reducing the risk of disease. The Pritikin Eating Plan does not focus on calories. Instead, it emphasizes high-quality, nutrient-rich foods. By knowing the characteristics of the foods, we choose, we can determine their calorie density and make informed decisions.  Targeting Your Nutrition Priorities  Clinical staff conducted group or individual video education with verbal and written material and guidebook.  Patient learns that lifestyle habits have a tremendous impact on disease risk and progression. This video provides eating and physical activity recommendations based on your personal health goals, such as reducing LDL cholesterol, losing weight, preventing or controlling type 2 diabetes, and reducing high blood pressure.  Vitamins and Minerals  Clinical staff conducted group or individual video education with verbal and written material and guidebook.  Patient learns different ways to obtain key vitamins and minerals, including through a recommended healthy diet. It is important to discuss all supplements you take with your doctor.   Healthy Mind-Set    Smoking Cessation  Clinical staff conducted group or individual video education with verbal and written material and guidebook.  Patient learns that cigarette smoking and tobacco addiction pose a serious health risk which affects millions  of people. Stopping smoking will significantly reduce the risk of heart disease, lung disease, and many forms of cancer. Recommended strategies for quitting are covered, including working with your doctor to develop a successful plan.  Culinary   Becoming a Set designer conducted group or individual video education with verbal and written material and guidebook.  Patient learns that cooking at home can be healthy, cost-effective, quick, and puts them in control. Keys to cooking healthy recipes will include looking at your recipe, assessing your equipment needs, planning ahead, making it simple, choosing cost-effective seasonal ingredients, and limiting the use of added fats, salts, and sugars.  Cooking - Breakfast and Snacks  Clinical staff conducted group or individual  video education with verbal and written material and guidebook.  Patient learns how important breakfast is to satiety and nutrition through the entire day. Recommendations include key foods to eat during breakfast to help stabilize blood sugar levels and to prevent overeating at meals later in the day. Planning ahead is also a key component.  Cooking - Educational psychologist conducted group or individual video education with verbal and written material and guidebook.  Patient learns eating strategies to improve overall health, including an approach to cook more at home. Recommendations include thinking of animal protein as a side on your plate rather than center stage and focusing instead on lower calorie dense options like vegetables, fruits, whole grains, and plant-based proteins, such as beans. Making sauces in large quantities to freeze for later and leaving the skin on your vegetables are also recommended to maximize your experience.  Cooking - Healthy Salads and Dressing Clinical staff conducted group or individual video education with verbal and written material and guidebook.  Patient learns that  vegetables, fruits, whole grains, and legumes are the foundations of the Pritikin Eating Plan. Recommendations include how to incorporate each of these in flavorful and healthy salads, and how to create homemade salad dressings. Proper handling of ingredients is also covered. Cooking - Soups and [employer] - Soups and Desserts Clinical staff conducted group or individual video education with verbal and written material and guidebook.  Patient learns that Pritikin soups and desserts make for easy, nutritious, and delicious snacks and meal components that are low in sodium, fat, sugar, and calorie density, while high in vitamins, minerals, and filling fiber. Recommendations include simple and healthy ideas for soups and desserts.   Overview     The Pritikin Solution Program Overview Clinical staff conducted group or individual video education with verbal and written material and guidebook.  Patient learns that the results of the Pritikin Program have been documented in more than 100 articles published in peer-reviewed journals, and the benefits include reducing risk factors for (and, in some cases, even reversing) high cholesterol, high blood pressure, type 2 diabetes, obesity, and more! An overview of the three key pillars of the Pritikin Program will be covered: eating well, doing regular exercise, and having a healthy mind-set.  WORKSHOPS  Exercise: Exercise Basics: Building Your Action Plan Clinical staff led group instruction and group discussion with PowerPoint presentation and patient guidebook. To enhance the learning environment the use of posters, models and videos may be added. At the conclusion of this workshop, patients will comprehend the difference between physical activity and exercise, as well as the benefits of incorporating both, into their routine. Patients will understand the FITT (Frequency, Intensity, Time, and Type) principle and how to use it to build an exercise action  plan. In addition, safety concerns and other considerations for exercise and cardiac rehab will be addressed by the presenter. The purpose of this lesson is to promote a comprehensive and effective weekly exercise routine in order to improve patients' overall level of fitness.   Managing Heart Disease: Your Path to a Healthier Heart Clinical staff led group instruction and group discussion with PowerPoint presentation and patient guidebook. To enhance the learning environment the use of posters, models and videos may be added.At the conclusion of this workshop, patients will understand the anatomy and physiology of the heart. Additionally, they will understand how Pritikin's three pillars impact the risk factors, the progression, and the management of heart disease.  The purpose of  this lesson is to provide a high-level overview of the heart, heart disease, and how the Pritikin lifestyle positively impacts risk factors.  Exercise Biomechanics Clinical staff led group instruction and group discussion with PowerPoint presentation and patient guidebook. To enhance the learning environment the use of posters, models and videos may be added. Patients will learn how the structural parts of their bodies function and how these functions impact their daily activities, movement, and exercise. Patients will learn how to promote a neutral spine, learn how to manage pain, and identify ways to improve their physical movement in order to promote healthy living. The purpose of this lesson is to expose patients to common physical limitations that impact physical activity. Participants will learn practical ways to adapt and manage aches and pains, and to minimize their effect on regular exercise. Patients will learn how to maintain good posture while sitting, walking, and lifting.  Balance Training and Fall Prevention  Clinical staff led group instruction and group discussion with PowerPoint presentation and  patient guidebook. To enhance the learning environment the use of posters, models and videos may be added. At the conclusion of this workshop, patients will understand the importance of their sensorimotor skills (vision, proprioception, and the vestibular system) in maintaining their ability to balance as they age. Patients will apply a variety of balancing exercises that are appropriate for their current level of function. Patients will understand the common causes for poor balance, possible solutions to these problems, and ways to modify their physical environment in order to minimize their fall risk. The purpose of this lesson is to teach patients about the importance of maintaining balance as they age and ways to minimize their risk of falling.  WORKSHOPS   Nutrition:  Fueling a Ship broker led group instruction and group discussion with PowerPoint presentation and patient guidebook. To enhance the learning environment the use of posters, models and videos may be added. Patients will review the foundational principles of the Pritikin Eating Plan and understand what constitutes a serving size in each of the food groups. Patients will also learn Pritikin-friendly foods that are better choices when away from home and review make-ahead meal and snack options. Calorie density will be reviewed and applied to three nutrition priorities: weight maintenance, weight loss, and weight gain. The purpose of this lesson is to reinforce (in a group setting) the key concepts around what patients are recommended to eat and how to apply these guidelines when away from home by planning and selecting Pritikin-friendly options. Patients will understand how calorie density may be adjusted for different weight management goals.  Mindful Eating  Clinical staff led group instruction and group discussion with PowerPoint presentation and patient guidebook. To enhance the learning environment the use of posters,  models and videos may be added. Patients will briefly review the concepts of the Pritikin Eating Plan and the importance of low-calorie dense foods. The concept of mindful eating will be introduced as well as the importance of paying attention to internal hunger signals. Triggers for non-hunger eating and techniques for dealing with triggers will be explored. The purpose of this lesson is to provide patients with the opportunity to review the basic principles of the Pritikin Eating Plan, discuss the value of eating mindfully and how to measure internal cues of hunger and fullness using the Hunger Scale. Patients will also discuss reasons for non-hunger eating and learn strategies to use for controlling emotional eating.  Targeting Your Nutrition Priorities Clinical staff led group instruction  and group discussion with PowerPoint presentation and patient guidebook. To enhance the learning environment the use of posters, models and videos may be added. Patients will learn how to determine their genetic susceptibility to disease by reviewing their family history. Patients will gain insight into the importance of diet as part of an overall healthy lifestyle in mitigating the impact of genetics and other environmental insults. The purpose of this lesson is to provide patients with the opportunity to assess their personal nutrition priorities by looking at their family history, their own health history and current risk factors. Patients will also be able to discuss ways of prioritizing and modifying the Pritikin Eating Plan for their highest risk areas  Menu  Clinical staff led group instruction and group discussion with PowerPoint presentation and patient guidebook. To enhance the learning environment the use of posters, models and videos may be added. Using menus brought in from [employer], or printed from [employer], patients will apply the Pritikin dining out guidelines that were presented in the  [employer] video. Patients will also be able to practice these guidelines in a variety of provided scenarios. The purpose of this lesson is to provide patients with the opportunity to practice hands-on learning of the Pritikin Dining Out guidelines with actual menus and practice scenarios.  Label Reading Clinical staff led group instruction and group discussion with PowerPoint presentation and patient guidebook. To enhance the learning environment the use of posters, models and videos may be added. Patients will review and discuss the Pritikin label reading guidelines presented in P[employer]ikin's Label Reading Educational series video. Using fool labels brought in from local grocery stores and markets, patients will apply the label reading guidelines and determine if the packaged food meet the Pritikin guidelines. The purpose of this lesson is to provide patients with the opportunity to review, discuss, and practice hands-on learning of the Pritikin Label Reading guidelines with actual packaged food labels. Cooking School  Pritikin's [employer] are designed to teach patients ways to prepare quick, simple, and affordable recipes at home. The importance of nutrition's role in chronic disease risk reduction is reflected in its emphasis in the overall Pritikin program. By learning how to prepare essential core Pritikin Eating Plan recipes, patients will increase control over what they eat; be able to customize the flavor of foods without the use of added salt, sugar, or fat; and improve the quality of the food they consume. By learning a set of core recipes which are easily assembled, quickly prepared, and affordable, patients are more likely to prepare more healthy foods at home. These workshops focus on convenient breakfasts, simple entres, side dishes, and desserts which can be prepared with minimal effort and are consistent with nutrition recommendations for cardiovascular risk  reduction. Cooking [employer] are taught by a Armed forces logistics/support/administrative officer (RD) who has been trained by the [employer]. The chef or RD has a clear understanding of the importance of minimizing - if not completely eliminating - added fat, sugar, and sodium in recipes. Throughout the series of Cooking School Workshop sessions, patients will learn about healthy ingredients and efficient methods of cooking to build confidence in their capability to prepare    Cooking School weekly topics:  Adding Flavor- Sodium-Free  Fast and Healthy Breakfasts  Powerhouse Plant-Based Proteins  Satisfying Salads and Dressings  Simple Sides and Sauces  International Cuisine-Spotlight on the [employer] Zones  Delicious Desserts  Savory Soups  [employer] - Meals in  a Snap  Tasty Appetizers and Snacks  Comforting Weekend Breakfasts  One-Pot Wonders   Fast Evening Meals  Landscape architect Your Pritikin Plate  WORKSHOPS   Healthy Mindset (Psychosocial):  Focused Goals, Sustainable Changes Clinical staff led group instruction and group discussion with PowerPoint presentation and patient guidebook. To enhance the learning environment the use of posters, models and videos may be added. Patients will be able to apply effective goal setting strategies to establish at least one personal goal, and then take consistent, meaningful action toward that goal. They will learn to identify common barriers to achieving personal goals and develop strategies to overcome them. Patients will also gain an understanding of how our mind-set can impact our ability to achieve goals and the importance of cultivating a positive and growth-oriented mind-set. The purpose of this lesson is to provide patients with a deeper understanding of how to set and achieve personal goals, as well as the tools and strategies needed to overcome common obstacles which may arise along the way.  From Head to Heart: The  Power of a Healthy Outlook  Clinical staff led group instruction and group discussion with PowerPoint presentation and patient guidebook. To enhance the learning environment the use of posters, models and videos may be added. Patients will be able to recognize and describe the impact of emotions and mood on physical health. They will discover the importance of self-care and explore self-care practices which may work for them. Patients will also learn how to utilize the 4 C's to cultivate a healthier outlook and better manage stress and challenges. The purpose of this lesson is to demonstrate to patients how a healthy outlook is an essential part of maintaining good health, especially as they continue their cardiac rehab journey.  Healthy Sleep for a Healthy Heart Clinical staff led group instruction and group discussion with PowerPoint presentation and patient guidebook. To enhance the learning environment the use of posters, models and videos may be added. At the conclusion of this workshop, patients will be able to demonstrate knowledge of the importance of sleep to overall health, well-being, and quality of life. They will understand the symptoms of, and treatments for, common sleep disorders. Patients will also be able to identify daytime and nighttime behaviors which impact sleep, and they will be able to apply these tools to help manage sleep-related challenges. The purpose of this lesson is to provide patients with a general overview of sleep and outline the importance of quality sleep. Patients will learn about a few of the most common sleep disorders. Patients will also be introduced to the concept of "sleep hygiene," and discover ways to self-manage certain sleeping problems through simple daily behavior changes. Finally, the workshop will motivate patients by clarifying the links between quality sleep and their goals of heart-healthy living.   Recognizing and Reducing Stress Clinical staff led  group instruction and group discussion with PowerPoint presentation and patient guidebook. To enhance the learning environment the use of posters, models and videos may be added. At the conclusion of this workshop, patients will be able to understand the types of stress reactions, differentiate between acute and chronic stress, and recognize the impact that chronic stress has on their health. They will also be able to apply different coping mechanisms, such as reframing negative self-talk. Patients will have the opportunity to practice a variety of stress management techniques, such as deep abdominal breathing, progressive muscle relaxation, and/or guided imagery.  The purpose of this lesson is to educate  patients on the role of stress in their lives and to provide healthy techniques for coping with it.  Learning Barriers/Preferences:  Learning Barriers/Preferences - 11/03/22 1236       Learning Barriers/Preferences   Learning Barriers Sight;Inability to learn new things   wears glasses, has mild dementia   Learning Preferences Audio;Computer/Internet;Group Instruction;Individual Instruction;Pictoral;Skilled Demonstration;Verbal Instruction;Written Material;Video             Education Topics:  Knowledge Questionnaire Score:  Knowledge Questionnaire Score - 11/03/22 1236       Knowledge Questionnaire Score   Pre Score 19/24             Core Components/Risk Factors/Patient Goals at Admission:  Personal Goals and Risk Factors at Admission - 11/03/22 1055       Core Components/Risk Factors/Patient Goals on Admission    Weight Management Yes;Weight Maintenance    Intervention Weight Management: Develop a combined nutrition and exercise program designed to reach desired caloric intake, while maintaining appropriate intake of nutrient and fiber, sodium and fats, and appropriate energy expenditure required for the weight goal.;Weight Management: Provide education and appropriate  resources to help participant work on and attain dietary goals.    Expected Outcomes Short Term: Continue to assess and modify interventions until short term weight is achieved;Long Term: Adherence to nutrition and physical activity/exercise program aimed toward attainment of established weight goal;Weight Maintenance: Understanding of the daily nutrition guidelines, which includes 25-35% calories from fat, 7% or less cal from saturated fats, less than 200mg  cholesterol, less than 1.5gm of sodium, & 5 or more servings of fruits and vegetables daily;Understanding recommendations for meals to include 15-35% energy as protein, 25-35% energy from fat, 35-60% energy from carbohydrates, less than 200mg  of dietary cholesterol, 20-35 gm of total fiber daily;Understanding of distribution of calorie intake throughout the day with the consumption of 4-5 meals/snacks    Diabetes Yes    Intervention Provide education about signs/symptoms and action to take for hypo/hyperglycemia.;Provide education about proper nutrition, including hydration, and aerobic/resistive exercise prescription along with prescribed medications to achieve blood glucose in normal ranges: Fasting glucose 65-99 mg/dL    Expected Outcomes Short Term: Participant verbalizes understanding of the signs/symptoms and immediate care of hyper/hypoglycemia, proper foot care and importance of medication, aerobic/resistive exercise and nutrition plan for blood glucose control.;Long Term: Attainment of HbA1C < 7%.    Hypertension Yes    Intervention Provide education on lifestyle modifcations including regular physical activity/exercise, weight management, moderate sodium restriction and increased consumption of fresh fruit, vegetables, and low fat dairy, alcohol moderation, and smoking cessation.;Monitor prescription use compliance.    Expected Outcomes Short Term: Continued assessment and intervention until BP is < 140/18mm HG in hypertensive participants. <  130/71mm HG in hypertensive participants with diabetes, heart failure or chronic kidney disease.;Long Term: Maintenance of blood pressure at goal levels.    Lipids Yes    Intervention Provide education and support for participant on nutrition & aerobic/resistive exercise along with prescribed medications to achieve LDL 70mg , HDL >40mg .    Expected Outcomes Long Term: Cholesterol controlled with medications as prescribed, with individualized exercise RX and with personalized nutrition plan. Value goals: LDL < 70mg , HDL > 40 mg.;Short Term: Participant states understanding of desired cholesterol values and is compliant with medications prescribed. Participant is following exercise prescription and nutrition guidelines.             Core Components/Risk Factors/Patient Goals Review:   Goals and Risk Factor Review     Row  Name 11/29/22 1352             Core Components/Risk Factors/Patient Goals Review   Personal Goals Review Weight Management/Obesity;Lipids;Diabetes;Hypertension       Review Larita Fife is doing well with exercise at intensive cardiac rehab. Vital signs and CBG's have been stable.       Expected Outcomes Larita Fife will continue to participate in intensive cardiac rehab for exercise nutrition and lifestyle modifications                Core Components/Risk Factors/Patient Goals at Discharge (Final Review):   Goals and Risk Factor Review - 11/29/22 1352       Core Components/Risk Factors/Patient Goals Review   Personal Goals Review Weight Management/Obesity;Lipids;Diabetes;Hypertension    Review Larita Fife is doing well with exercise at intensive cardiac rehab. Vital signs and CBG's have been stable.    Expected Outcomes Larita Fife will continue to participate in intensive cardiac rehab for exercise nutrition and lifestyle modifications             ITP Comments:  ITP Comments     Row Name 11/03/22 1044 11/29/22 1346         ITP Comments Dr. Armanda Magic medical director.  Introduction to [facility] education/ intensive cardiac rehab. Initial orientation packet reviewed with patient. 30 Day ITP REview. Larita Fife has good attendance and participation in intensive cardiac rehab               Comments: See ITP Comments

## 2022-11-30 ENCOUNTER — Encounter (HOSPITAL_COMMUNITY)
Admission: RE | Admit: 2022-11-30 | Discharge: 2022-11-30 | Disposition: A | Discharge status: Home / Self Care | Payer: Medicare HMO | Source: Ambulatory Visit | Attending: Cardiology | Admitting: Cardiology

## 2022-11-30 ENCOUNTER — Ambulatory Visit (HOSPITAL_COMMUNITY): Payer: Medicare HMO

## 2022-11-30 DIAGNOSIS — I214 Non-ST elevation (NSTEMI) myocardial infarction: Secondary | ICD-10-CM

## 2022-11-30 DIAGNOSIS — Z951 Presence of aortocoronary bypass graft: Secondary | ICD-10-CM

## 2022-11-30 DIAGNOSIS — Z9861 Coronary angioplasty status: Secondary | ICD-10-CM

## 2022-12-02 ENCOUNTER — Ambulatory Visit (HOSPITAL_COMMUNITY): Payer: Medicare HMO

## 2022-12-02 ENCOUNTER — Encounter (HOSPITAL_COMMUNITY)
Admission: RE | Admit: 2022-12-02 | Discharge: 2022-12-02 | Disposition: A | Discharge status: Home / Self Care | Payer: Medicare HMO | Source: Ambulatory Visit | Attending: Cardiology | Admitting: Cardiology

## 2022-12-02 DIAGNOSIS — Z9861 Coronary angioplasty status: Secondary | ICD-10-CM | POA: Diagnosis not present

## 2022-12-02 DIAGNOSIS — I214 Non-ST elevation (NSTEMI) myocardial infarction: Secondary | ICD-10-CM | POA: Diagnosis not present

## 2022-12-02 DIAGNOSIS — Z951 Presence of aortocoronary bypass graft: Secondary | ICD-10-CM | POA: Diagnosis not present

## 2022-12-05 ENCOUNTER — Ambulatory Visit (HOSPITAL_COMMUNITY): Payer: Medicare HMO

## 2022-12-05 ENCOUNTER — Encounter (HOSPITAL_COMMUNITY)
Admission: RE | Admit: 2022-12-05 | Discharge: 2022-12-05 | Disposition: A | Discharge status: Home / Self Care | Payer: Medicare HMO | Source: Ambulatory Visit | Attending: Cardiology | Admitting: Cardiology

## 2022-12-05 DIAGNOSIS — I214 Non-ST elevation (NSTEMI) myocardial infarction: Secondary | ICD-10-CM | POA: Diagnosis not present

## 2022-12-05 DIAGNOSIS — Z951 Presence of aortocoronary bypass graft: Secondary | ICD-10-CM | POA: Diagnosis not present

## 2022-12-05 DIAGNOSIS — Z9861 Coronary angioplasty status: Secondary | ICD-10-CM

## 2022-12-07 ENCOUNTER — Encounter (HOSPITAL_COMMUNITY)
Admission: RE | Admit: 2022-12-07 | Discharge: 2022-12-07 | Disposition: A | Discharge status: Home / Self Care | Payer: Medicare HMO | Source: Ambulatory Visit | Attending: Cardiology | Admitting: Cardiology

## 2022-12-07 ENCOUNTER — Ambulatory Visit (HOSPITAL_COMMUNITY): Payer: Medicare HMO

## 2022-12-07 DIAGNOSIS — Z9861 Coronary angioplasty status: Secondary | ICD-10-CM | POA: Diagnosis not present

## 2022-12-07 DIAGNOSIS — I214 Non-ST elevation (NSTEMI) myocardial infarction: Secondary | ICD-10-CM | POA: Diagnosis not present

## 2022-12-07 DIAGNOSIS — Z951 Presence of aortocoronary bypass graft: Secondary | ICD-10-CM

## 2022-12-09 ENCOUNTER — Ambulatory Visit (HOSPITAL_COMMUNITY): Payer: Medicare HMO

## 2022-12-09 ENCOUNTER — Encounter (HOSPITAL_COMMUNITY)
Admission: RE | Admit: 2022-12-09 | Discharge: 2022-12-09 | Disposition: A | Discharge status: Home / Self Care | Payer: Medicare HMO | Source: Ambulatory Visit | Attending: Cardiology | Admitting: Cardiology

## 2022-12-09 DIAGNOSIS — I214 Non-ST elevation (NSTEMI) myocardial infarction: Secondary | ICD-10-CM | POA: Diagnosis not present

## 2022-12-09 DIAGNOSIS — Z9861 Coronary angioplasty status: Secondary | ICD-10-CM

## 2022-12-09 DIAGNOSIS — Z951 Presence of aortocoronary bypass graft: Secondary | ICD-10-CM | POA: Diagnosis not present

## 2022-12-09 NOTE — Progress Notes (Signed)
Reviewed home exercise guidelines with Mr. Gohr including endpoints, temperature precautions, target heart rate and rate of perceived exertion. He is currently walking 15-20 minutes, 2 days/week as his mode of home exercise and has 8 lb weights that he uses for his resistance training. Discussed increasing walking duration to 30 minutes, and he is amenable to this. He will add 1-2 minutes each session as tolerated until achieving goal. Mr Silver voices understanding of instructions given.  Artist Pais, MS, ACSM CEP

## 2022-12-12 ENCOUNTER — Encounter (HOSPITAL_COMMUNITY)
Admission: RE | Admit: 2022-12-12 | Discharge: 2022-12-12 | Disposition: A | Discharge status: Home / Self Care | Payer: Medicare HMO | Source: Ambulatory Visit | Attending: Cardiology | Admitting: Cardiology

## 2022-12-12 ENCOUNTER — Ambulatory Visit (HOSPITAL_COMMUNITY): Payer: Medicare HMO

## 2022-12-12 DIAGNOSIS — Z9861 Coronary angioplasty status: Secondary | ICD-10-CM | POA: Diagnosis not present

## 2022-12-12 DIAGNOSIS — Z951 Presence of aortocoronary bypass graft: Secondary | ICD-10-CM

## 2022-12-12 DIAGNOSIS — I214 Non-ST elevation (NSTEMI) myocardial infarction: Secondary | ICD-10-CM | POA: Diagnosis not present

## 2022-12-14 ENCOUNTER — Encounter (HOSPITAL_COMMUNITY)
Admission: RE | Admit: 2022-12-14 | Discharge: 2022-12-14 | Disposition: A | Discharge status: Home / Self Care | Payer: Medicare HMO | Source: Ambulatory Visit | Attending: Cardiology | Admitting: Cardiology

## 2022-12-14 ENCOUNTER — Telehealth: Payer: Self-pay | Admitting: Cardiology

## 2022-12-14 ENCOUNTER — Ambulatory Visit (HOSPITAL_COMMUNITY): Payer: Medicare HMO

## 2022-12-14 DIAGNOSIS — Z9861 Coronary angioplasty status: Secondary | ICD-10-CM | POA: Diagnosis not present

## 2022-12-14 DIAGNOSIS — I214 Non-ST elevation (NSTEMI) myocardial infarction: Secondary | ICD-10-CM

## 2022-12-14 DIAGNOSIS — Z951 Presence of aortocoronary bypass graft: Secondary | ICD-10-CM | POA: Diagnosis not present

## 2022-12-14 NOTE — Telephone Encounter (Signed)
Pt c/o medication issue:  1. Name of Medication:   Sublingual nitroglycerin  2. How are you currently taking this medication (dosage and times per day)?   Not taking  3. Are you having a reaction (difficulty breathing--STAT)?   4. What is your medication issue?   Caller stated they want to know if Dr. Anne Fu would prescribe patient this medication.

## 2022-12-14 NOTE — Progress Notes (Signed)
Patient does not have a prescription for sublingual nitroglycerin. Dr Judd Gaudier office called and notified to see if patient needs a Rx for PRN nitroglycerin.Thayer Headings RN BSN

## 2022-12-16 ENCOUNTER — Encounter (HOSPITAL_COMMUNITY)
Admission: RE | Admit: 2022-12-16 | Discharge: 2022-12-16 | Disposition: A | Discharge status: Home / Self Care | Payer: Medicare HMO | Source: Ambulatory Visit | Attending: Cardiology | Admitting: Cardiology

## 2022-12-16 ENCOUNTER — Ambulatory Visit (HOSPITAL_COMMUNITY): Payer: Medicare HMO

## 2022-12-16 DIAGNOSIS — Z951 Presence of aortocoronary bypass graft: Secondary | ICD-10-CM

## 2022-12-16 DIAGNOSIS — Z9861 Coronary angioplasty status: Secondary | ICD-10-CM | POA: Diagnosis not present

## 2022-12-16 DIAGNOSIS — I214 Non-ST elevation (NSTEMI) myocardial infarction: Secondary | ICD-10-CM

## 2022-12-19 ENCOUNTER — Encounter (HOSPITAL_COMMUNITY)
Admission: RE | Admit: 2022-12-19 | Discharge: 2022-12-19 | Disposition: A | Discharge status: Home / Self Care | Payer: Medicare HMO | Source: Ambulatory Visit | Attending: Cardiology | Admitting: Cardiology

## 2022-12-19 ENCOUNTER — Ambulatory Visit (HOSPITAL_COMMUNITY): Payer: Medicare HMO

## 2022-12-19 DIAGNOSIS — Z9861 Coronary angioplasty status: Secondary | ICD-10-CM

## 2022-12-19 DIAGNOSIS — I214 Non-ST elevation (NSTEMI) myocardial infarction: Secondary | ICD-10-CM

## 2022-12-19 DIAGNOSIS — Z951 Presence of aortocoronary bypass graft: Secondary | ICD-10-CM

## 2022-12-19 MED ORDER — NITROGLYCERIN 0.4 MG SL SUBL: 0.4000 mg | SUBLINGUAL_TABLET | SUBLINGUAL | 3 refills | Status: DC | PRN

## 2022-12-19 NOTE — Telephone Encounter (Signed)
RX sent into pharmacy as requested.   

## 2022-12-20 DIAGNOSIS — N1831 Chronic kidney disease, stage 3a: Secondary | ICD-10-CM | POA: Diagnosis not present

## 2022-12-20 NOTE — Telephone Encounter (Signed)
Patient called to report he did receive his prescription for nitroGLYCERIN (NITROSTAT) 0.4 MG SL tablet.

## 2022-12-20 NOTE — Telephone Encounter (Signed)
Left message on VM (OK per DPR) RX for SL Ntg has been sent into his pharmacy as requested.  Requested he call back if any questions or concerns.

## 2022-12-21 ENCOUNTER — Encounter (HOSPITAL_COMMUNITY)
Admission: RE | Admit: 2022-12-21 | Discharge: 2022-12-21 | Disposition: A | Discharge status: Home / Self Care | Payer: Medicare HMO | Source: Ambulatory Visit | Attending: Cardiology | Admitting: Cardiology

## 2022-12-21 ENCOUNTER — Ambulatory Visit (HOSPITAL_COMMUNITY): Payer: Medicare HMO

## 2022-12-21 DIAGNOSIS — I214 Non-ST elevation (NSTEMI) myocardial infarction: Secondary | ICD-10-CM

## 2022-12-21 DIAGNOSIS — Z9861 Coronary angioplasty status: Secondary | ICD-10-CM

## 2022-12-21 DIAGNOSIS — Z951 Presence of aortocoronary bypass graft: Secondary | ICD-10-CM

## 2022-12-23 ENCOUNTER — Ambulatory Visit (HOSPITAL_COMMUNITY): Payer: Medicare HMO

## 2022-12-23 ENCOUNTER — Encounter (HOSPITAL_COMMUNITY)
Admission: RE | Admit: 2022-12-23 | Discharge: 2022-12-23 | Disposition: A | Discharge status: Home / Self Care | Payer: Medicare HMO | Source: Ambulatory Visit | Attending: Cardiology | Admitting: Cardiology

## 2022-12-23 DIAGNOSIS — I214 Non-ST elevation (NSTEMI) myocardial infarction: Secondary | ICD-10-CM | POA: Diagnosis not present

## 2022-12-23 DIAGNOSIS — Z9861 Coronary angioplasty status: Secondary | ICD-10-CM | POA: Diagnosis not present

## 2022-12-23 DIAGNOSIS — Z951 Presence of aortocoronary bypass graft: Secondary | ICD-10-CM

## 2022-12-26 ENCOUNTER — Ambulatory Visit (HOSPITAL_COMMUNITY): Payer: Medicare HMO

## 2022-12-26 ENCOUNTER — Encounter (HOSPITAL_COMMUNITY)
Admission: RE | Admit: 2022-12-26 | Discharge: 2022-12-26 | Disposition: A | Discharge status: Home / Self Care | Payer: Medicare HMO | Source: Ambulatory Visit | Attending: Cardiology | Admitting: Cardiology

## 2022-12-26 DIAGNOSIS — Z5189 Encounter for other specified aftercare: Secondary | ICD-10-CM | POA: Insufficient documentation

## 2022-12-26 DIAGNOSIS — I252 Old myocardial infarction: Secondary | ICD-10-CM | POA: Diagnosis not present

## 2022-12-26 DIAGNOSIS — I214 Non-ST elevation (NSTEMI) myocardial infarction: Secondary | ICD-10-CM

## 2022-12-26 DIAGNOSIS — Z9861 Coronary angioplasty status: Secondary | ICD-10-CM | POA: Diagnosis not present

## 2022-12-27 NOTE — Progress Notes (Signed)
Cardiac Individual Treatment Plan  Patient Details  Name: Shane Ford MRN: [id number] Date of Birth: Mar 02, 1946 Referring Provider:   Flowsheet Row INTENSIVE CARDIAC REHAB ORIENT from 11/03/2022 in [facility]  Referring Provider Donato Schultz, MD       Initial Encounter Date:  Flowsheet Row INTENSIVE CARDIAC REHAB ORIENT from 11/03/2022 in [facility]  Date 11/03/22       Visit Diagnosis: 10/22/22 NSTEMI (non-ST elevated myocardial infarction) (HCC)  10/22/22 S/P PTCA (percutaneous transluminal coronary angioplasty)  Patient's Home Medications on Admission:  Current Outpatient Medications:    acetaminophen (TYLENOL) 500 MG tablet, Take 1,000 mg by mouth every 6 (six) hours as needed for moderate pain., Disp: , Rfl:    aspirin EC 81 MG tablet, Take 81 mg by mouth in the morning. Swallow whole., Disp: , Rfl:    atorvastatin (LIPITOR) 80 MG tablet, Take 1 tablet (80 mg total) by mouth daily., Disp: 30 tablet, Rfl: 3   Cholecalciferol 50 MCG (2000 UT) CAPS, Take 2,000 Units by mouth in the morning., Disp: , Rfl:    docusate sodium (COLACE) 100 MG capsule, Take 100 mg by mouth daily as needed for mild constipation., Disp: , Rfl:    donepezil (ARICEPT) 10 MG tablet, Take 10 mg by mouth at bedtime., Disp: , Rfl:    empagliflozin (JARDIANCE) 10 MG TABS tablet, Take 1 tablet (10 mg total) by mouth daily before breakfast., Disp: 30 tablet, Rfl: 3   fenofibrate 160 MG tablet, Take 160 mg by mouth in the morning., Disp: , Rfl:    glimepiride (AMARYL) 1 MG tablet, Take 1 mg by mouth in the morning and at bedtime., Disp: , Rfl:    metFORMIN (GLUCOPHAGE) 500 MG tablet, Take 500 mg by mouth 2 (two) times daily with a meal., Disp: , Rfl:    methotrexate (RHEUMATREX) 2.5 MG tablet, Take 12.5 mg by mouth every Wednesday. Take 6 tablets (12.5mg ) once a week on Wednesdays.  Caution:Chemotherapy. Protect from light., Disp: , Rfl:    metoprolol tartrate (LOPRESSOR) 25 MG tablet, Take 0.5 tablets (12.5 mg total) by mouth 2 (two) times daily., Disp: 30 tablet, Rfl: 2   mirtazapine (REMERON) 15 MG tablet, Take 15 mg by mouth at bedtime., Disp: , Rfl:    nitroGLYCERIN (NITROSTAT) 0.4 MG SL tablet, Place 1 tablet (0.4 mg total) under the tongue every 5 (five) minutes as needed for chest pain., Disp: 25 tablet, Rfl: 3   Polyethyl Glycol-Propyl Glycol (SYSTANE) 0.4-0.3 % SOLN, Apply 1 drop to eye 2 (two) times daily as needed., Disp: , Rfl:    potassium chloride SA (KLOR-CON M) 20 MEQ tablet, Take 2 tablets (40 mEq total) by mouth once for 1 dose. On 4/29 take 2 tablets ( total) once., Disp: 2 tablet, Rfl: 0   ticagrelor (BRILINTA) 90 MG TABS tablet, Take 1 tablet (90 mg total) by mouth 2 (two) times daily., Disp: 60 tablet, Rfl: 3   vitamin B-12 (CYANOCOBALAMIN) 1000 MCG tablet, Take 1,000 mcg by mouth daily., Disp: , Rfl:   Past Medical History: Past Medical History:  Diagnosis Date   Arthritis    pt states he was diagnosed 10+ years ago; psoriatic arthritis   Coronary artery disease    Depression    pt diagnosed 10+ years ago   Diabetes mellitus (HCC)    Dysrhythmia 01/17/2021   first degree AV block, 2nd degree AVB,  Mobitz I in setting of MI   Hyperlipidemia    Hypertension     Tobacco Use: Social History   Tobacco Use  Smoking Status Never  Smokeless Tobacco Never    Labs: Review Flowsheet  More data may exist      Latest Ref Rng & Units 01/17/2021 01/18/2021 01/19/2021 05/04/2021 10/22/2022  Labs for ITP Cardiac and Pulmonary Rehab  Cholestrol 0 - 200 mg/dL 161  - - - 096   LDL (calc) 0 - 99 mg/dL 71  - - - 67   HDL-C >04 mg/dL 45  - - - 48   Trlycerides <150 mg/dL 540  - - - 87   Hemoglobin A1c 4.8 - 5.6 % - 8.5  - 6.5  6.8   PH, Arterial 7.350 - 7.450 - - 7.369  7.335  7.370  7.455  7.375  7.350  - -  PCO2 arterial 32.0 - 48.0 mmHg - - 38.2   42.6  38.1  38.0  48.4  48.8  - -  Bicarbonate 20.0 - 28.0 mmol/L - - 21.9  22.7  22.0  26.8  26.4  28.3  27.0  - -  TCO2 22 - 32 mmol/L - - 23  24  23  21  27  28  26  28  30  20  28  28   - -  Acid-base deficit 0.0 - 2.0 mmol/L - - 3.0  3.0  3.0  - -  O2 Saturation % - - 98.0  99.0  100.0  100.0  85.0  100.0  100.0  - -    Capillary Blood Glucose: Lab Results  Component Value Date   GLUCAP 110 (H) 11/18/2022   GLUCAP 145 (H) 11/18/2022   GLUCAP 154 (H) 11/16/2022   GLUCAP 89 11/14/2022   GLUCAP 81 11/14/2022     Exercise Target Goals: Exercise Program Goal: Individual exercise prescription set using results from initial 6 min walk test and THRR while considering  patient's activity barriers and safety.   Exercise Prescription Goal: Initial exercise prescription builds to 30-45 minutes a day of aerobic activity, 2-3 days per week.  Home exercise guidelines will be given to patient during program as part of exercise prescription that the participant will acknowledge.  Activity Barriers & Risk Stratification:  Activity Barriers & Cardiac Risk Stratification - 11/03/22 1225       Activity Barriers & Cardiac Risk Stratification   Activity Barriers Arthritis;Back Problems;Neck/Spine Problems;Joint Problems;Shortness of Breath;Balance Concerns    Cardiac Risk Stratification High   <5 METS on            6 Minute Walk:  6 Minute Walk     Row Name 11/03/22 1224         6 Minute Walk   Phase Initial     Distance 1200 feet     Walk Time 6 minutes     # of Rest Breaks 0     MPH 2.27     METS 2.76     RPE 11     Perceived Dyspnea  1     VO2 Peak 9.65     Symptoms Yes (comment)     Comments mild SOB, resolved with rest. no pain     Resting HR 61 bpm     Resting BP 104/56     Resting Oxygen Saturation  98 %     Exercise Oxygen Saturation  during 6 min walk 98 %  Max Ex. HR 97 bpm     Max Ex. BP 120/58     2 Minute Post BP 110/60              Oxygen  Initial Assessment:   Oxygen Re-Evaluation:   Oxygen Discharge (Final Oxygen Re-Evaluation):   Initial Exercise Prescription:  Initial Exercise Prescription - 11/03/22 1200       Date of Initial Exercise RX and Referring Provider   Date 11/03/22    Referring Provider Donato Schultz, MD    Expected Discharge Date 01/13/23      NuStep   Level 1    SPM 75    Minutes 15    METs 2      Track   Laps 18    Minutes 15    METs 2.5      Prescription Details   Frequency (times per week) 3    Duration Progress to 30 minutes of continuous aerobic without signs/symptoms of physical distress      Intensity   THRR 40-80% of Max Heartrate 58-115    Ratings of Perceived Exertion 11-13    Perceived Dyspnea 0-4      Progression   Progression Continue progressive overload as per policy without signs/symptoms or physical distress.      Resistance Training   Training Prescription Yes    Weight 3 lbs    Reps 10-15             Perform Capillary Blood Glucose checks as needed.  Exercise Prescription Changes:   Exercise Prescription Changes     Row Name 11/11/22 1400 11/28/22 1008 12/12/22 1019 12/26/22 1025       Response to Exercise   Blood Pressure (Admit) 110/70 120/58 108/58 114/60    Blood Pressure (Exercise) 140/80 124/74 140/72 114/72    Blood Pressure (Exit) 108/62 102/68 100/60 100/60    Heart Rate (Admit) 63 bpm 65 bpm 71 bpm 61 bpm    Heart Rate (Exercise) 105 bpm 116 bpm 87 bpm 93 bpm    Heart Rate (Exit) 72 bpm 74 bpm 69 bpm 67 bpm    Rating of Perceived Exertion (Exercise) 11.5 12 12 8     Perceived Dyspnea (Exercise) 0 0 0 0    Symptoms none None None None    Comments Pt first day in CRP2 Reviewed METs with patient. -- Reviewed METs with patient.    Duration Continue with 30 min of aerobic exercise without signs/symptoms of physical distress. Continue with 30 min of aerobic exercise without signs/symptoms of physical distress. Continue with 30 min of aerobic  exercise without signs/symptoms of physical distress. Continue with 30 min of aerobic exercise without signs/symptoms of physical distress.    Intensity THRR unchanged THRR unchanged THRR unchanged THRR unchanged      Progression   Progression Continue to progress workloads to maintain intensity without signs/symptoms of physical distress. Continue to progress workloads to maintain intensity without signs/symptoms of physical distress. Continue to progress workloads to maintain intensity without signs/symptoms of physical distress. Continue to progress workloads to maintain intensity without signs/symptoms of physical distress.    Average METs 2.59 3.3 3.3 3.1      Resistance Training   Training Prescription Yes Yes Yes Yes    Weight 3 lb 3 lb 4 lbs 4 lbs    Reps 10-15 10-15 10-15 10-15    Time -- 10 Minutes 10 Minutes 10 Minutes      Interval Training   Interval Training --  No No No      Recumbant Bike   Level -- -- -- 3    Minutes -- -- -- 15    METs -- -- -- 3.2      NuStep   Level 1 1 3  --    SPM 84 105 108 --    Minutes 15 15 15  --    METs 2.4 3.1 3.5 --      Track   Laps 14 20  220 feet/lap 17  220 feet/lap 16  220 feet/lap    Minutes 15 15 15 15     METs 2.79 3.55 3.17 3.04      Home Exercise Plan   Plans to continue exercise at -- -- Home (comment)  Walking Home (comment)  Walking    Frequency -- -- Add 2 additional days to program exercise sessions. Add 2 additional days to program exercise sessions.    Initial Home Exercises Provided -- -- 12/09/22 12/09/22             Exercise Comments:   Exercise Comments     Row Name 11/11/22 1406 11/28/22 1109 12/09/22 1103 12/14/22 1109 12/26/22 1114   Exercise Comments Pt first day in CRP2 program. Pt tolerated exercise well and is learning his exercise parameters. Will continue to progress workloads as tolerated. Reviewed METs with patient. Reviewed home exercise guidelines and goals with patient. Increase hand weights  from 3 to 4 lbs today and will increase workload on NuStep to load of 3 next week. Reviewed METs with patient. Reviewed METs with patient.            Exercise Goals and Review:   Exercise Goals     Row Name 11/03/22 1054             Exercise Goals   Increase Physical Activity Yes       Intervention Provide advice, education, support and counseling about physical activity/exercise needs.;Develop an individualized exercise prescription for aerobic and resistive training based on initial evaluation findings, risk stratification, comorbidities and participant's personal goals.       Expected Outcomes Short Term: Attend rehab on a regular basis to increase amount of physical activity.;Long Term: Exercising regularly at least 3-5 days a week.;Long Term: Add in home exercise to make exercise part of routine and to increase amount of physical activity.       Increase Strength and Stamina Yes       Intervention Provide advice, education, support and counseling about physical activity/exercise needs.;Develop an individualized exercise prescription for aerobic and resistive training based on initial evaluation findings, risk stratification, comorbidities and participant's personal goals.       Expected Outcomes Short Term: Increase workloads from initial exercise prescription for resistance, speed, and METs.;Short Term: Perform resistance training exercises routinely during rehab and add in resistance training at home;Long Term: Improve cardiorespiratory fitness, muscular endurance and strength as measured by increased METs and functional capacity ( )       Able to understand and use rate of perceived exertion (RPE) scale Yes       Intervention Provide education and explanation on how to use RPE scale       Expected Outcomes Short Term: Able to use RPE daily in rehab to express subjective intensity level;Long Term:  Able to use RPE to guide intensity level when exercising independently        Knowledge and understanding of Target Heart Rate Range (THRR) Yes       Intervention Provide  education and explanation of THRR including how the numbers were predicted and where they are located for reference       Expected Outcomes Short Term: Able to state/look up THRR;Short Term: Able to use daily as guideline for intensity in rehab;Long Term: Able to use THRR to govern intensity when exercising independently       Understanding of Exercise Prescription Yes       Intervention Provide education, explanation, and written materials on patient's individual exercise prescription       Expected Outcomes Short Term: Able to explain program exercise prescription;Long Term: Able to explain home exercise prescription to exercise independently                Exercise Goals Re-Evaluation :  Exercise Goals Re-Evaluation     Row Name 11/11/22 1405 12/09/22 1103           Exercise Goal Re-Evaluation   Exercise Goals Review Increase Strength and Stamina;Increase Physical Activity;Able to understand and use rate of perceived exertion (RPE) scale;Knowledge and understanding of Target Heart Rate Range (THRR);Understanding of Exercise Prescription Increase Strength and Stamina;Increase Physical Activity;Able to understand and use rate of perceived exertion (RPE) scale;Knowledge and understanding of Target Heart Rate Range (THRR);Understanding of Exercise Prescription      Comments Pt first day in CRP2 program. Pt tolerated exercise well with an avg MET level of 2.59. Pt is learning his THRR and RPE scale. Pt understands his ExRx. Pt was encouraged to focus on picking up his feet during walking track to avoid shuffled gait. Reviewd exercise prescription with Larita Fife. He is currently walking 15-20 minutes, 2 days/week. Discussed increasing duration gradually 1-2 minutes each session with a goal of reaching 30 minutes, 2 days/week. Patient has 8 lb hand weights that he uses for resistance training.      Expected  Outcomes Will continue to progress workloads as tolerated without s/sx. Larita Fife will increase duration of walking at home to 30 minutes 2 days/week in addition to exercise at cardiac rehab to achieve 150 minutes of aerobic exercise per week.               Discharge Exercise Prescription (Final Exercise Prescription Changes):  Exercise Prescription Changes - 12/26/22 1025       Response to Exercise   Blood Pressure (Admit) 114/60    Blood Pressure (Exercise) 114/72    Blood Pressure (Exit) 100/60    Heart Rate (Admit) 61 bpm    Heart Rate (Exercise) 93 bpm    Heart Rate (Exit) 67 bpm    Rating of Perceived Exertion (Exercise) 8    Perceived Dyspnea (Exercise) 0    Symptoms None    Comments Reviewed METs with patient.    Duration Continue with 30 min of aerobic exercise without signs/symptoms of physical distress.    Intensity THRR unchanged      Progression   Progression Continue to progress workloads to maintain intensity without signs/symptoms of physical distress.    Average METs 3.1      Resistance Training   Training Prescription Yes    Weight 4 lbs    Reps 10-15    Time 10 Minutes      Interval Training   Interval Training No      Recumbant Bike   Level 3    Minutes 15    METs 3.2      Track   Laps 16   220 feet/lap   Minutes 15    METs 3.04  Home Exercise Plan   Plans to continue exercise at Home (comment)   Walking   Frequency Add 2 additional days to program exercise sessions.    Initial Home Exercises Provided 12/09/22             Nutrition:  Target Goals: Understanding of nutrition guidelines, daily intake of sodium 1500mg , cholesterol 200mg , calories 30% from fat and 7% or less from saturated fats, daily to have 5 or more servings of fruits and vegetables.  Biometrics:  Pre Biometrics - 11/03/22 1030       Pre Biometrics   Waist Circumference 38 inches    Hip Circumference 38.25 inches    Waist to Hip Ratio 0.99 %    Triceps  Skinfold 24 mm    % Body Fat 26.5 %    Grip Strength 38 kg    Flexibility 0 in   could not reach   Single Leg Stand 4.68 seconds              Nutrition Therapy Plan and Nutrition Goals:  Nutrition Therapy & Goals - 12/14/22 0923       Nutrition Therapy   Diet Heart healthy/carbohydrate consistent diet    Drug/Food Interactions Statins/Certain Fruits      Personal Nutrition Goals   Nutrition Goal Patient to identify strategies for reducing cardiovascular risk by attending the [employer] education and nutrition series weekly.    Personal Goal #2 Patient to improve diet quality by using the plate method as a guide for meal planning to include lean protein/plant protein, fruits, vegetables, whole grains, nonfat dairy as part of a well-balanced diet.    Comments Goals in progress. Larita Fife continues to attend the [employer] and nutrition series regularly. Larita Fife has previously completed cardiac rehab in 2022. He is down ~4# since starting with our program. Patient will benefit from participation in intensive cardiac rehab for nutrition, exercise, and lifestyle modification.      Intervention Plan   Intervention Prescribe, educate and counsel regarding individualized specific dietary modifications aiming towards targeted core components such as weight, hypertension, lipid management, diabetes, heart failure and other comorbidities.;Nutrition handout(s) given to patient.    Expected Outcomes Short Term Goal: Understand basic principles of dietary content, such as calories, fat, sodium, cholesterol and nutrients.;Long Term Goal: Adherence to prescribed nutrition plan.             Nutrition Assessments:  Nutrition Assessments - 11/14/22 1139       Rate Your Plate Scores   Pre Score 68            MEDIFICTS Score Key: ?70 Need to make dietary changes  40-70 Heart Healthy Diet ? 40 Therapeutic Level Cholesterol Diet   Flowsheet Row INTENSIVE CARDIAC REHAB from 11/14/2022 in  [facility]  Picture Your Plate Total Score on Admission 68      Picture Your Plate Scores: <09 Unhealthy dietary pattern with much room for improvement. 41-50 Dietary pattern unlikely to meet recommendations for good health and room for improvement. 51-60 More healthful dietary pattern, with some room for improvement.  >60 Healthy dietary pattern, although there may be some specific behaviors that could be improved.    Nutrition Goals Re-Evaluation:  Nutrition Goals Re-Evaluation     Row Name 11/11/22 1130 12/14/22 0923           Goals   Current Weight 180 lb 5.4 oz (81.8 kg) 177 lb 14.6 oz (80.7 kg)  Comment lipoproteinA WNL, lipids WNL, A1c 6.8 A1c 6.8, lipids WNL, Cr 1.4, GFR 52      Expected Outcome Patient will benefit from participation in intensive cardiac rehab for nutrition, exercise, and lifestyle modification. Goals in progress. Larita Fife continues to attend the [employer] and nutrition series regularly. Larita Fife has previously completed cardiac rehab in 2022. He is down ~4# since starting with our program. Patient will benefit from participation in intensive cardiac rehab for nutrition, exercise, and lifestyle modification.               Nutrition Goals Re-Evaluation:  Nutrition Goals Re-Evaluation     Row Name 11/11/22 1130 12/14/22 0923           Goals   Current Weight 180 lb 5.4 oz (81.8 kg) 177 lb 14.6 oz (80.7 kg)      Comment lipoproteinA WNL, lipids WNL, A1c 6.8 A1c 6.8, lipids WNL, Cr 1.4, GFR 52      Expected Outcome Patient will benefit from participation in intensive cardiac rehab for nutrition, exercise, and lifestyle modification. Goals in progress. Larita Fife continues to attend the [employer] and nutrition series regularly. Larita Fife has previously completed cardiac rehab in 2022. He is down ~4# since starting with our program. Patient will benefit from participation in intensive cardiac  rehab for nutrition, exercise, and lifestyle modification.               Nutrition Goals Discharge (Final Nutrition Goals Re-Evaluation):  Nutrition Goals Re-Evaluation - 12/14/22 1610       Goals   Current Weight 177 lb 14.6 oz (80.7 kg)    Comment A1c 6.8, lipids WNL, Cr 1.4, GFR 52    Expected Outcome Goals in progress. Larita Fife continues to attend the [employer] and nutrition series regularly. Larita Fife has previously completed cardiac rehab in 2022. He is down ~4# since starting with our program. Patient will benefit from participation in intensive cardiac rehab for nutrition, exercise, and lifestyle modification.             Psychosocial: Target Goals: Acknowledge presence or absence of significant depression and/or stress, maximize coping skills, provide positive support system. Participant is able to verbalize types and ability to use techniques and skills needed for reducing stress and depression.  Initial Review & Psychosocial Screening:  Initial Psych Review & Screening - 11/03/22 1233       Initial Review   Current issues with Current Depression;Current Anxiety/Panic;Current Psychotropic Meds      Family Dynamics   Good Support System? Yes   Larita Fife has his family for support   Comments Larita Fife is currently on medication for depression and anxiety. He shared that he feels this medication is working for him currently and is happy with his regime. He has been managing his mental health with his PCP of almost 20 years and is happy with his care. He stated that he still has some bad days, but nothing "more than he can handle". Larita Fife was reassured that we have resources should he need additional help.      Barriers   Psychosocial barriers to participate in program The patient should benefit from training in stress management and relaxation.      Screening Interventions   Interventions Encouraged to exercise;Provide feedback about the scores to participant    Expected Outcomes  Short Term goal: Identification and review with participant of any Quality of Life or Depression concerns found by scoring the questionnaire.;Long Term goal: The participant improves quality of Life and  PHQ9 Scores as seen by post scores and/or verbalization of changes;Long Term Goal: Stressors or current issues are controlled or eliminated.             Quality of Life Scores:  Quality of Life - 11/03/22 1236       Quality of Life   Select Quality of Life      Quality of Life Scores   Health/Function Pre 22.7 %    Socioeconomic Pre 27.21 %    Psych/Spiritual Pre 24.64 %    Family Pre 28.8 %    GLOBAL Pre 24.93 %            Scores of 19 and below usually indicate a poorer quality of life in these areas.  A difference of  2-3 points is a clinically meaningful difference.  A difference of 2-3 points in the total score of the Quality of Life Index has been associated with significant improvement in overall quality of life, self-image, physical symptoms, and general health in studies assessing change in quality of life.  PHQ-9: Review Flowsheet       11/03/2022 04/13/2021  Depression screen PHQ 2/9  Decreased Interest 0 0  Down, Depressed, Hopeless 1 0  PHQ - 2 Score 1 0  Altered sleeping 0 -  Tired, decreased energy 0 -  Change in appetite 0 -  Feeling bad or failure about yourself  1 -  Trouble concentrating 1 -  Moving slowly or fidgety/restless 0 -  Suicidal thoughts 0 -  PHQ-9 Score 3 -  Difficult doing work/chores Somewhat difficult -   Interpretation of Total Score  Total Score Depression Severity:  1-4 = Minimal depression, 5-9 = Mild depression, 10-14 = Moderate depression, 15-19 = Moderately severe depression, 20-27 = Severe depression   Psychosocial Evaluation and Intervention:   Psychosocial Re-Evaluation:  Psychosocial Re-Evaluation     Row Name 11/29/22 1348 12/27/22 1335           Psychosocial Re-Evaluation   Current issues with Current  Depression;History of Depression;Current Psychotropic Meds Current Depression;History of Depression;Current Psychotropic Meds      Comments Larita Fife has not voiced any increased concerns or stressors at intensive cardiac rehab. Larita Fife continues not voice any increased concerns or stressors at intensive cardiac rehab.      Interventions Stress management education;Encouraged to attend Cardiac Rehabilitation for the exercise;Relaxation education Stress management education;Encouraged to attend Cardiac Rehabilitation for the exercise;Relaxation education      Continue Psychosocial Services  No Follow up required No Follow up required               Psychosocial Discharge (Final Psychosocial Re-Evaluation):  Psychosocial Re-Evaluation - 12/27/22 1335       Psychosocial Re-Evaluation   Current issues with Current Depression;History of Depression;Current Psychotropic Meds    Comments Larita Fife continues not voice any increased concerns or stressors at intensive cardiac rehab.    Interventions Stress management education;Encouraged to attend Cardiac Rehabilitation for the exercise;Relaxation education    Continue Psychosocial Services  No Follow up required             Vocational Rehabilitation: Provide vocational rehab assistance to qualifying candidates.   Vocational Rehab Evaluation & Intervention:  Vocational Rehab - 11/03/22 1055       Initial Vocational Rehab Evaluation & Intervention   Assessment shows need for Vocational Rehabilitation No   Nijee is retired            Education: Education Goals: Education classes will be  provided on a weekly basis, covering required topics. Participant will state understanding/return demonstration of topics presented.    Education     Row Name 11/11/22 1300     Education   Cardiac Education Topics Pritikin   Nurse, children's Exercise Physiologist   Select Psychosocial   Psychosocial How Our Thoughts Can Heal  Our Hearts   Instruction Review Code 1- Verbalizes Understanding   Class Start Time 1150   Class Stop Time 1226   Class Time Calculation (min) 36 min    Row Name 11/14/22 1200     Education   Cardiac Education Topics Pritikin   Geographical information systems officer Psychosocial   Psychosocial Workshop From Head to Heart: The Power of a Healthy Outlook   Instruction Review Code 1- Verbalizes Understanding   Class Start Time 1153   Class Stop Time 1237   Class Time Calculation (min) 44 min    Row Name 11/16/22 1300     Education   Cardiac Education Topics Pritikin   Customer service manager   Weekly Topic Powerhouse Plant-Based Proteins   Instruction Review Code 1- Verbalizes Understanding   Class Start Time 1135   Class Stop Time 1210   Class Time Calculation (min) 35 min    Row Name 11/18/22 1400     Education   Cardiac Education Topics Pritikin   Psychologist, forensic General Education   General Education Hypertension and Heart Disease   Instruction Review Code 1- Verbalizes Understanding   Class Start Time 1145   Class Stop Time 1220   Class Time Calculation (min) 35 min    Row Name 11/23/22 1300     Education   Cardiac Education Topics Pritikin   Customer service manager   Weekly Topic Adding Flavor - Sodium-Free   Instruction Review Code 1- Verbalizes Understanding   Class Start Time 1138   Class Stop Time 1210   Class Time Calculation (min) 32 min    Row Name 11/25/22 1400     Education   Cardiac Education Topics Pritikin   Hospital doctor Education   General Education Heart Disease Risk Reduction   Instruction Review Code 1- Verbalizes Understanding   Class Start Time 1135   Class Stop Time 1210   Class  Time Calculation (min) 35 min    Row Name 11/28/22 1300     Education   Cardiac Education Topics Pritikin   Select Workshops     Workshops   Educator Exercise Physiologist   Select Exercise   Exercise Workshop Location manager and Fall Prevention   Instruction Review Code 1- Verbalizes Understanding   Class Start Time 1146   Class Stop Time 1226   Class Time Calculation (min) 40 min    Row Name 11/30/22 1300     Education   Cardiac Education Topics Pritikin   Customer service manager   Weekly Topic Fast and Healthy Breakfasts   Instruction Review Code 1- Verbalizes Understanding   Class Start Time 1140   Class Stop Time 1212  Class Time Calculation (min) 32 min    Row Name 12/02/22 1300     Education   Cardiac Education Topics Pritikin   Select Core Videos     Core Videos   Educator Dietitian   Select Nutrition   Nutrition Overview of the Pritikin Eating Plan   Instruction Review Code 1- Verbalizes Understanding   Class Start Time 1145   Class Stop Time 1220   Class Time Calculation (min) 35 min    Row Name 12/05/22 1600     Education   Cardiac Education Topics Pritikin   Select Workshops     Workshops   Educator Exercise Physiologist   Select Psychosocial   Psychosocial Workshop Recognizing and Reducing Stress   Instruction Review Code 1- Verbalizes Understanding   Class Start Time 1152   Class Stop Time 1234   Class Time Calculation (min) 42 min    Row Name 12/07/22 1300     Education   Cardiac Education Topics Pritikin   Customer service manager   Weekly Topic Personalizing Your Pritikin Plate   Instruction Review Code 1- Verbalizes Understanding   Class Start Time 1145   Class Stop Time 1220   Class Time Calculation (min) 35 min    Row Name 12/09/22 1300     Education   Cardiac Education Topics Pritikin   Nurse, children's  Exercise Physiologist   Select Psychosocial   Psychosocial Healthy Minds, Bodies, Hearts   Instruction Review Code 1- Verbalizes Understanding   Class Start Time 1150   Class Stop Time 1236   Class Time Calculation (min) 46 min    Row Name 12/12/22 1500     Education   Cardiac Education Topics Pritikin   Glass blower/designer Nutrition   Nutrition Workshop Label Reading   Instruction Review Code 1- Verbalizes Understanding   Class Start Time 1145   Class Stop Time 1221   Class Time Calculation (min) 36 min    Row Name 12/14/22 1500     Education   Cardiac Education Topics Pritikin   Customer service manager   Weekly Topic [employer] Desserts   Instruction Review Code 1- Verbalizes Understanding   Class Start Time 1145   Class Stop Time 1235   Class Time Calculation (min) 50 min    Row Name 12/16/22 1300     Education   Cardiac Education Topics Pritikin   Nurse, children's   Educator Dietitian   Nutrition Other  Label reading   Instruction Review Code 1- Verbalizes Understanding   Class Start Time 1147   Class Stop Time 1226   Class Time Calculation (min) 39 min    Row Name 12/21/22 1300     Education   Cardiac Education Topics Pritikin   Customer service manager   Weekly Topic Tasty Appetizers and Snacks   Instruction Review Code 1- Verbalizes Understanding   Class Start Time 1143   Class Stop Time 1215   Class Time Calculation (min) 32 min    Row Name 12/23/22 1400     Education   Cardiac Education Topics Pritikin   Nurse, children's  Exercise Physiologist   Select Nutrition   Nutrition Nutrition Action Plan   Instruction Review Code 1- Verbalizes Understanding   Class Start Time 1145   Class Stop Time 1218   Class Time Calculation (min) 33 min    Row Name 12/26/22 1200      Education   Cardiac Education Topics Pritikin   Select Core Videos     Core Videos   Educator Dietitian   Select Nutrition   Nutrition Calorie Density   Instruction Review Code 1- Verbalizes Understanding   Class Start Time 1145   Class Stop Time 1223   Class Time Calculation (min) 38 min            Core Videos: Exercise    Move It!  Clinical staff conducted group or individual video education with verbal and written material and guidebook.  Patient learns the recommended Pritikin exercise program. Exercise with the goal of living a long, healthy life. Some of the health benefits of exercise include controlled diabetes, healthier blood pressure levels, improved cholesterol levels, improved heart and lung capacity, improved sleep, and better body composition. Everyone should speak with their doctor before starting or changing an exercise routine.  Biomechanical Limitations Clinical staff conducted group or individual video education with verbal and written material and guidebook.  Patient learns how biomechanical limitations can impact exercise and how we can mitigate and possibly overcome limitations to have an impactful and balanced exercise routine.  Body Composition Clinical staff conducted group or individual video education with verbal and written material and guidebook.  Patient learns that body composition (ratio of muscle mass to fat mass) is a key component to assessing overall fitness, rather than body weight alone. Increased fat mass, especially visceral belly fat, can put [place] at increased risk for metabolic syndrome, type 2 diabetes, heart disease, and even death. It is recommended to combine diet and exercise (cardiovascular and resistance training) to improve your body composition. Seek guidance from your physician and exercise physiologist before implementing an exercise routine.  Exercise Action Plan Clinical staff conducted group or individual video education with  verbal and written material and guidebook.  Patient learns the recommended strategies to achieve and enjoy long-term exercise adherence, including variety, self-motivation, self-efficacy, and positive decision making. Benefits of exercise include fitness, good health, weight management, more energy, better sleep, less stress, and overall well-being.  Medical   Heart Disease Risk Reduction Clinical staff conducted group or individual video education with verbal and written material and guidebook.  Patient learns our heart is our most vital organ as it circulates oxygen, nutrients, white blood cells, and hormones throughout the entire body, and carries waste away. Data supports a plant-based eating plan like the Pritikin Program for its effectiveness in slowing progression of and reversing heart disease. The video provides a number of recommendations to address heart disease.   Metabolic Syndrome and Belly Fat  Clinical staff conducted group or individual video education with verbal and written material and guidebook.  Patient learns what metabolic syndrome is, how it leads to heart disease, and how one can reverse it and keep it from coming back. You have metabolic syndrome if you have 3 of the following 5 criteria: abdominal obesity, high blood pressure, high triglycerides, low HDL cholesterol, and high blood sugar.  Hypertension and Heart Disease Clinical staff conducted group or individual video education with verbal and written material and guidebook.  Patient learns that high blood pressure, or hypertension, is very common in the [place].  Hypertension is largely due to excessive salt intake, but other important risk factors include being overweight, physical inactivity, drinking too much alcohol, smoking, and not eating enough potassium from fruits and vegetables. High blood pressure is a leading risk factor for heart attack, stroke, congestive heart failure, dementia, kidney failure, and  premature death. Long-term effects of excessive salt intake include stiffening of the arteries and thickening of heart muscle and organ damage. Recommendations include ways to reduce hypertension and the risk of heart disease.  Diseases of Our Time - Focusing on Diabetes Clinical staff conducted group or individual video education with verbal and written material and guidebook.  Patient learns why the best way to stop diseases of our time is prevention, through food and other lifestyle changes. Medicine (such as prescription pills and surgeries) is often only a Band-Aid on the problem, not a long-term solution. Most common diseases of our time include obesity, type 2 diabetes, hypertension, heart disease, and cancer. The Pritikin Program is recommended and has been proven to help reduce, reverse, and/or prevent the damaging effects of metabolic syndrome.  Nutrition   Overview of the Pritikin Eating Plan  Clinical staff conducted group or individual video education with verbal and written material and guidebook.  Patient learns about the Pritikin Eating Plan for disease risk reduction. The Pritikin Eating Plan emphasizes a wide variety of unrefined, minimally-processed carbohydrates, like fruits, vegetables, whole grains, and legumes. Go, Caution, and Stop food choices are explained. Plant-based and lean animal proteins are emphasized. Rationale provided for low sodium intake for blood pressure control, low added sugars for blood sugar stabilization, and low added fats and oils for coronary artery disease risk reduction and weight management.  Calorie Density  Clinical staff conducted group or individual video education with verbal and written material and guidebook.  Patient learns about calorie density and how it impacts the Pritikin Eating Plan. Knowing the characteristics of the food you choose will help you decide whether those foods will lead to weight gain or weight loss, and whether you want to  consume more or less of them. Weight loss is usually a side effect of the Pritikin Eating Plan because of its focus on low calorie-dense foods.  Label Reading  Clinical staff conducted group or individual video education with verbal and written material and guidebook.  Patient learns about the Pritikin recommended label reading guidelines and corresponding recommendations regarding calorie density, added sugars, sodium content, and whole grains.  Dining Out - Part 1  Clinical staff conducted group or individual video education with verbal and written material and guidebook.  Patient learns that restaurant meals can be sabotaging because they can be so high in calories, fat, sodium, and/or sugar. Patient learns recommended strategies on how to positively address this and avoid unhealthy pitfalls.  Facts on Fats  Clinical staff conducted group or individual video education with verbal and written material and guidebook.  Patient learns that lifestyle modifications can be just as effective, if not more so, as many medications for lowering your risk of heart disease. A Pritikin lifestyle can help to reduce your risk of inflammation and atherosclerosis (cholesterol build-up, or plaque, in the artery walls). Lifestyle interventions such as dietary choices and physical activity address the cause of atherosclerosis. A review of the types of fats and their impact on blood cholesterol levels, along with dietary recommendations to reduce fat intake is also included.  Nutrition Action Plan  Clinical staff conducted group or individual video education with verbal and written material  and guidebook.  Patient learns how to incorporate Pritikin recommendations into their lifestyle. Recommendations include planning and keeping personal health goals in mind as an important part of their success.  Healthy Mind-Set    Healthy Minds, Bodies, Hearts  Clinical staff conducted group or individual video education with  verbal and written material and guidebook.  Patient learns how to identify when they are stressed. Video will discuss the impact of that stress, as well as the many benefits of stress management. Patient will also be introduced to stress management techniques. The way we think, act, and feel has an impact on our hearts.  How Our Thoughts Can Heal Our Hearts  Clinical staff conducted group or individual video education with verbal and written material and guidebook.  Patient learns that negative thoughts can cause depression and anxiety. This can result in negative lifestyle behavior and serious health problems. Cognitive behavioral therapy is an effective method to help control our thoughts in order to change and improve our emotional outlook.  Additional Videos:  Exercise    Improving Performance  Clinical staff conducted group or individual video education with verbal and written material and guidebook.  Patient learns to use a non-linear approach by alternating intensity levels and lengths of time spent exercising to help burn more calories and lose more body fat. Cardiovascular exercise helps improve heart health, metabolism, hormonal balance, blood sugar control, and recovery from fatigue. Resistance training improves strength, endurance, balance, coordination, reaction time, metabolism, and muscle mass. Flexibility exercise improves circulation, posture, and balance. Seek guidance from your physician and exercise physiologist before implementing an exercise routine and learn your capabilities and proper form for all exercise.  Introduction to Yoga  Clinical staff conducted group or individual video education with verbal and written material and guidebook.  Patient learns about yoga, a discipline of the coming together of mind, breath, and body. The benefits of yoga include improved flexibility, improved range of motion, better posture and core strength, increased lung function, weight loss, and  positive self-image. Yoga's heart health benefits include lowered blood pressure, healthier heart rate, decreased cholesterol and triglyceride levels, improved immune function, and reduced stress. Seek guidance from your physician and exercise physiologist before implementing an exercise routine and learn your capabilities and proper form for all exercise.  Medical   Aging: Enhancing Your Quality of Life  Clinical staff conducted group or individual video education with verbal and written material and guidebook.  Patient learns key strategies and recommendations to stay in good physical health and enhance quality of life, such as prevention strategies, having an advocate, securing a Health Care Proxy and Power of Attorney, and keeping a list of medications and system for tracking them. It also discusses how to avoid risk for bone loss.  Biology of Weight Control  Clinical staff conducted group or individual video education with verbal and written material and guidebook.  Patient learns that weight gain occurs because we consume more calories than we burn (eating more, moving less). Even if your body weight is normal, you may have higher ratios of fat compared to muscle mass. Too much body fat puts you at increased risk for cardiovascular disease, heart attack, stroke, type 2 diabetes, and obesity-related cancers. In addition to exercise, following the Pritikin Eating Plan can help reduce your risk.  Decoding Lab Results  Clinical staff conducted group or individual video education with verbal and written material and guidebook.  Patient learns that lab test reflects one measurement whose values change over time  and are influenced by many factors, including medication, stress, sleep, exercise, food, hydration, pre-existing medical conditions, and more. It is recommended to use the knowledge from this video to become more involved with your lab results and evaluate your numbers to speak with your  doctor.   Diseases of Our Time - Overview  Clinical staff conducted group or individual video education with verbal and written material and guidebook.  Patient learns that according to the CDC, 50% to 70% of chronic diseases (such as obesity, type 2 diabetes, elevated lipids, hypertension, and heart disease) are avoidable through lifestyle improvements including healthier food choices, listening to satiety cues, and increased physical activity.  Sleep Disorders Clinical staff conducted group or individual video education with verbal and written material and guidebook.  Patient learns how good quality and duration of sleep are important to overall health and well-being. Patient also learns about sleep disorders and how they impact health along with recommendations to address them, including discussing with a physician.  Nutrition  Dining Out - Part 2 Clinical staff conducted group or individual video education with verbal and written material and guidebook.  Patient learns how to plan ahead and communicate in order to maximize their dining experience in a healthy and nutritious manner. Included are recommended food choices based on the type of restaurant the patient is visiting.   Fueling a Banker conducted group or individual video education with verbal and written material and guidebook.  There is a strong connection between our food choices and our health. Diseases like obesity and type 2 diabetes are very prevalent and are in large-part due to lifestyle choices. The Pritikin Eating Plan provides plenty of food and hunger-curbing satisfaction. It is easy to follow, affordable, and helps reduce health risks.  Menu Workshop  Clinical staff conducted group or individual video education with verbal and written material and guidebook.  Patient learns that restaurant meals can sabotage health goals because they are often packed with calories, fat, sodium, and sugar.  Recommendations include strategies to plan ahead and to communicate with the manager, chef, or server to help order a healthier meal.  Planning Your Eating Strategy  Clinical staff conducted group or individual video education with verbal and written material and guidebook.  Patient learns about the Pritikin Eating Plan and its benefit of reducing the risk of disease. The Pritikin Eating Plan does not focus on calories. Instead, it emphasizes high-quality, nutrient-rich foods. By knowing the characteristics of the foods, we choose, we can determine their calorie density and make informed decisions.  Targeting Your Nutrition Priorities  Clinical staff conducted group or individual video education with verbal and written material and guidebook.  Patient learns that lifestyle habits have a tremendous impact on disease risk and progression. This video provides eating and physical activity recommendations based on your personal health goals, such as reducing LDL cholesterol, losing weight, preventing or controlling type 2 diabetes, and reducing high blood pressure.  Vitamins and Minerals  Clinical staff conducted group or individual video education with verbal and written material and guidebook.  Patient learns different ways to obtain key vitamins and minerals, including through a recommended healthy diet. It is important to discuss all supplements you take with your doctor.   Healthy Mind-Set    Smoking Cessation  Clinical staff conducted group or individual video education with verbal and written material and guidebook.  Patient learns that cigarette smoking and tobacco addiction pose a serious health risk which affects millions of people.  Stopping smoking will significantly reduce the risk of heart disease, lung disease, and many forms of cancer. Recommended strategies for quitting are covered, including working with your doctor to develop a successful plan.  Culinary   Becoming a Corporate investment banker conducted group or individual video education with verbal and written material and guidebook.  Patient learns that cooking at home can be healthy, cost-effective, quick, and puts them in control. Keys to cooking healthy recipes will include looking at your recipe, assessing your equipment needs, planning ahead, making it simple, choosing cost-effective seasonal ingredients, and limiting the use of added fats, salts, and sugars.  Cooking - Breakfast and Snacks  Clinical staff conducted group or individual video education with verbal and written material and guidebook.  Patient learns how important breakfast is to satiety and nutrition through the entire day. Recommendations include key foods to eat during breakfast to help stabilize blood sugar levels and to prevent overeating at meals later in the day. Planning ahead is also a key component.  Cooking - Educational psychologist conducted group or individual video education with verbal and written material and guidebook.  Patient learns eating strategies to improve overall health, including an approach to cook more at home. Recommendations include thinking of animal protein as a side on your plate rather than center stage and focusing instead on lower calorie dense options like vegetables, fruits, whole grains, and plant-based proteins, such as beans. Making sauces in large quantities to freeze for later and leaving the skin on your vegetables are also recommended to maximize your experience.  Cooking - Healthy Salads and Dressing Clinical staff conducted group or individual video education with verbal and written material and guidebook.  Patient learns that vegetables, fruits, whole grains, and legumes are the foundations of the Pritikin Eating Plan. Recommendations include how to incorporate each of these in flavorful and healthy salads, and how to create homemade salad dressings. Proper handling of ingredients is also covered.  Cooking - Soups and [employer] - Soups and Desserts Clinical staff conducted group or individual video education with verbal and written material and guidebook.  Patient learns that Pritikin soups and desserts make for easy, nutritious, and delicious snacks and meal components that are low in sodium, fat, sugar, and calorie density, while high in vitamins, minerals, and filling fiber. Recommendations include simple and healthy ideas for soups and desserts.   Overview     The Pritikin Solution Program Overview Clinical staff conducted group or individual video education with verbal and written material and guidebook.  Patient learns that the results of the Pritikin Program have been documented in more than 100 articles published in peer-reviewed journals, and the benefits include reducing risk factors for (and, in some cases, even reversing) high cholesterol, high blood pressure, type 2 diabetes, obesity, and more! An overview of the three key pillars of the Pritikin Program will be covered: eating well, doing regular exercise, and having a healthy mind-set.  WORKSHOPS  Exercise: Exercise Basics: Building Your Action Plan Clinical staff led group instruction and group discussion with PowerPoint presentation and patient guidebook. To enhance the learning environment the use of posters, models and videos may be added. At the conclusion of this workshop, patients will comprehend the difference between physical activity and exercise, as well as the benefits of incorporating both, into their routine. Patients will understand the FITT (Frequency, Intensity, Time, and Type) principle and how to use it to build an exercise action plan.  In addition, safety concerns and other considerations for exercise and cardiac rehab will be addressed by the presenter. The purpose of this lesson is to promote a comprehensive and effective weekly exercise routine in order to improve patients' overall level of  fitness.   Managing Heart Disease: Your Path to a Healthier Heart Clinical staff led group instruction and group discussion with PowerPoint presentation and patient guidebook. To enhance the learning environment the use of posters, models and videos may be added.At the conclusion of this workshop, patients will understand the anatomy and physiology of the heart. Additionally, they will understand how Pritikin's three pillars impact the risk factors, the progression, and the management of heart disease.  The purpose of this lesson is to provide a high-level overview of the heart, heart disease, and how the Pritikin lifestyle positively impacts risk factors.  Exercise Biomechanics Clinical staff led group instruction and group discussion with PowerPoint presentation and patient guidebook. To enhance the learning environment the use of posters, models and videos may be added. Patients will learn how the structural parts of their bodies function and how these functions impact their daily activities, movement, and exercise. Patients will learn how to promote a neutral spine, learn how to manage pain, and identify ways to improve their physical movement in order to promote healthy living. The purpose of this lesson is to expose patients to common physical limitations that impact physical activity. Participants will learn practical ways to adapt and manage aches and pains, and to minimize their effect on regular exercise. Patients will learn how to maintain good posture while sitting, walking, and lifting.  Balance Training and Fall Prevention  Clinical staff led group instruction and group discussion with PowerPoint presentation and patient guidebook. To enhance the learning environment the use of posters, models and videos may be added. At the conclusion of this workshop, patients will understand the importance of their sensorimotor skills (vision, proprioception, and the vestibular system)  in maintaining their ability to balance as they age. Patients will apply a variety of balancing exercises that are appropriate for their current level of function. Patients will understand the common causes for poor balance, possible solutions to these problems, and ways to modify their physical environment in order to minimize their fall risk. The purpose of this lesson is to teach patients about the importance of maintaining balance as they age and ways to minimize their risk of falling.  WORKSHOPS   Nutrition:  Fueling a Ship broker led group instruction and group discussion with PowerPoint presentation and patient guidebook. To enhance the learning environment the use of posters, models and videos may be added. Patients will review the foundational principles of the Pritikin Eating Plan and understand what constitutes a serving size in each of the food groups. Patients will also learn Pritikin-friendly foods that are better choices when away from home and review make-ahead meal and snack options. Calorie density will be reviewed and applied to three nutrition priorities: weight maintenance, weight loss, and weight gain. The purpose of this lesson is to reinforce (in a group setting) the key concepts around what patients are recommended to eat and how to apply these guidelines when away from home by planning and selecting Pritikin-friendly options. Patients will understand how calorie density may be adjusted for different weight management goals.  Mindful Eating  Clinical staff led group instruction and group discussion with PowerPoint presentation and patient guidebook. To enhance the learning environment the use of posters, models and videos may be  added. Patients will briefly review the concepts of the Pritikin Eating Plan and the importance of low-calorie dense foods. The concept of mindful eating will be introduced as well as the importance of paying attention to internal hunger  signals. Triggers for non-hunger eating and techniques for dealing with triggers will be explored. The purpose of this lesson is to provide patients with the opportunity to review the basic principles of the Pritikin Eating Plan, discuss the value of eating mindfully and how to measure internal cues of hunger and fullness using the Hunger Scale. Patients will also discuss reasons for non-hunger eating and learn strategies to use for controlling emotional eating.  Targeting Your Nutrition Priorities Clinical staff led group instruction and group discussion with PowerPoint presentation and patient guidebook. To enhance the learning environment the use of posters, models and videos may be added. Patients will learn how to determine their genetic susceptibility to disease by reviewing their family history. Patients will gain insight into the importance of diet as part of an overall healthy lifestyle in mitigating the impact of genetics and other environmental insults. The purpose of this lesson is to provide patients with the opportunity to assess their personal nutrition priorities by looking at their family history, their own health history and current risk factors. Patients will also be able to discuss ways of prioritizing and modifying the Pritikin Eating Plan for their highest risk areas  Menu  Clinical staff led group instruction and group discussion with PowerPoint presentation and patient guidebook. To enhance the learning environment the use of posters, models and videos may be added. Using menus brought in from [employer], or printed from [employer], patients will apply the Pritikin dining out guidelines that were presented in the [employer] video. Patients will also be able to practice these guidelines in a variety of provided scenarios. The purpose of this lesson is to provide patients with the opportunity to practice hands-on learning of the Pritikin Dining Out guidelines  with actual menus and practice scenarios.  Label Reading Clinical staff led group instruction and group discussion with PowerPoint presentation and patient guidebook. To enhance the learning environment the use of posters, models and videos may be added. Patients will review and discuss the Pritikin label reading guidelines presented in Pritikin's Label Reading Educational series video. Using fool labels brought in from local grocery stores and markets, patients will apply the label reading guidelines and determine if the packaged food meet the Pritikin guidelines. The purpose of this lesson is to provide patients with the opportunity to review, discuss, and practice hands-on learning of the Pritikin Label Reading guidelines with actual packaged food labels. Cooking School  Pritikin's [employer] are designed to teach patients ways to prepare quick, simple, and affordable recipes at home. The importance of nutrition's role in chronic disease risk reduction is reflected in its emphasis in the overall Pritikin program. By learning how to prepare essential core Pritikin Eating Plan recipes, patients will increase control over what they eat; be able to customize the flavor of foods without the use of added salt, sugar, or fat; and improve the quality of the food they consume. By learning a set of core recipes which are easily assembled, quickly prepared, and affordable, patients are more likely to prepare more healthy foods at home. These workshops focus on convenient breakfasts, simple entres, side dishes, and desserts which can be prepared with minimal effort and are consistent with nutrition recommendations for cardiovascular risk reduction. Cooking [employer]  are taught by a chef or registered dietitian (RD) who has been trained by the [employer] team. The chef or RD has a clear understanding of the importance of minimizing - if not completely eliminating - added fat, sugar, and  sodium in recipes. Throughout the series of Cooking School Workshop sessions, patients will learn about healthy ingredients and efficient methods of cooking to build confidence in their capability to prepare    Cooking School weekly topics:  Adding Flavor- Sodium-Free  Fast and Healthy Breakfasts  Powerhouse Plant-Based Proteins  Satisfying Salads and Dressings  Simple Sides and Sauces  International Cuisine-Spotlight on the [employer] Zones  Delicious Desserts  Savory Soups  [employer] - Meals in a Astronomer Appetizers and Snacks  Comforting Weekend Breakfasts  One-Pot Wonders   Fast Evening Meals  Landscape architect Your Pritikin Plate  WORKSHOPS   Healthy Mindset (Psychosocial):  Focused Goals, Sustainable Changes Clinical staff led group instruction and group discussion with PowerPoint presentation and patient guidebook. To enhance the learning environment the use of posters, models and videos may be added. Patients will be able to apply effective goal setting strategies to establish at least one personal goal, and then take consistent, meaningful action toward that goal. They will learn to identify common barriers to achieving personal goals and develop strategies to overcome them. Patients will also gain an understanding of how our mind-set can impact our ability to achieve goals and the importance of cultivating a positive and growth-oriented mind-set. The purpose of this lesson is to provide patients with a deeper understanding of how to set and achieve personal goals, as well as the tools and strategies needed to overcome common obstacles which may arise along the way.  From Head to Heart: The Power of a Healthy Outlook  Clinical staff led group instruction and group discussion with PowerPoint presentation and patient guidebook. To enhance the learning environment the use of posters, models and videos may be added. Patients will be able to recognize and  describe the impact of emotions and mood on physical health. They will discover the importance of self-care and explore self-care practices which may work for them. Patients will also learn how to utilize the 4 C's to cultivate a healthier outlook and better manage stress and challenges. The purpose of this lesson is to demonstrate to patients how a healthy outlook is an essential part of maintaining good health, especially as they continue their cardiac rehab journey.  Healthy Sleep for a Healthy Heart Clinical staff led group instruction and group discussion with PowerPoint presentation and patient guidebook. To enhance the learning environment the use of posters, models and videos may be added. At the conclusion of this workshop, patients will be able to demonstrate knowledge of the importance of sleep to overall health, well-being, and quality of life. They will understand the symptoms of, and treatments for, common sleep disorders. Patients will also be able to identify daytime and nighttime behaviors which impact sleep, and they will be able to apply these tools to help manage sleep-related challenges. The purpose of this lesson is to provide patients with a general overview of sleep and outline the importance of quality sleep. Patients will learn about a few of the most common sleep disorders. Patients will also be introduced to the concept of "sleep hygiene," and discover ways to self-manage certain sleeping problems through simple daily behavior changes. Finally, the workshop will motivate patients by clarifying the links between quality sleep and their  goals of heart-healthy living.   Recognizing and Reducing Stress Clinical staff led group instruction and group discussion with PowerPoint presentation and patient guidebook. To enhance the learning environment the use of posters, models and videos may be added. At the conclusion of this workshop, patients will be able to understand the types of stress  reactions, differentiate between acute and chronic stress, and recognize the impact that chronic stress has on their health. They will also be able to apply different coping mechanisms, such as reframing negative self-talk. Patients will have the opportunity to practice a variety of stress management techniques, such as deep abdominal breathing, progressive muscle relaxation, and/or guided imagery.  The purpose of this lesson is to educate patients on the role of stress in their lives and to provide healthy techniques for coping with it.  Learning Barriers/Preferences:  Learning Barriers/Preferences - 11/03/22 1236       Learning Barriers/Preferences   Learning Barriers Sight;Inability to learn new things   wears glasses, has mild dementia   Learning Preferences Audio;Computer/Internet;Group Instruction;Individual Instruction;Pictoral;Skilled Demonstration;Verbal Instruction;Written Material;Video             Education Topics:  Knowledge Questionnaire Score:  Knowledge Questionnaire Score - 11/03/22 1236       Knowledge Questionnaire Score   Pre Score 19/24             Core Components/Risk Factors/Patient Goals at Admission:  Personal Goals and Risk Factors at Admission - 11/03/22 1055       Core Components/Risk Factors/Patient Goals on Admission    Weight Management Yes;Weight Maintenance    Intervention Weight Management: Develop a combined nutrition and exercise program designed to reach desired caloric intake, while maintaining appropriate intake of nutrient and fiber, sodium and fats, and appropriate energy expenditure required for the weight goal.;Weight Management: Provide education and appropriate resources to help participant work on and attain dietary goals.    Expected Outcomes Short Term: Continue to assess and modify interventions until short term weight is achieved;Long Term: Adherence to nutrition and physical activity/exercise program aimed toward attainment of  established weight goal;Weight Maintenance: Understanding of the daily nutrition guidelines, which includes 25-35% calories from fat, 7% or less cal from saturated fats, less than 200mg  cholesterol, less than 1.5gm of sodium, & 5 or more servings of fruits and vegetables daily;Understanding recommendations for meals to include 15-35% energy as protein, 25-35% energy from fat, 35-60% energy from carbohydrates, less than 200mg  of dietary cholesterol, 20-35 gm of total fiber daily;Understanding of distribution of calorie intake throughout the day with the consumption of 4-5 meals/snacks    Diabetes Yes    Intervention Provide education about signs/symptoms and action to take for hypo/hyperglycemia.;Provide education about proper nutrition, including hydration, and aerobic/resistive exercise prescription along with prescribed medications to achieve blood glucose in normal ranges: Fasting glucose 65-99 mg/dL    Expected Outcomes Short Term: Participant verbalizes understanding of the signs/symptoms and immediate care of hyper/hypoglycemia, proper foot care and importance of medication, aerobic/resistive exercise and nutrition plan for blood glucose control.;Long Term: Attainment of HbA1C < 7%.    Hypertension Yes    Intervention Provide education on lifestyle modifcations including regular physical activity/exercise, weight management, moderate sodium restriction and increased consumption of fresh fruit, vegetables, and low fat dairy, alcohol moderation, and smoking cessation.;Monitor prescription use compliance.    Expected Outcomes Short Term: Continued assessment and intervention until BP is < 140/58mm HG in hypertensive participants. < 130/27mm HG in hypertensive participants with diabetes, heart failure or chronic kidney  disease.;Long Term: Maintenance of blood pressure at goal levels.    Lipids Yes    Intervention Provide education and support for participant on nutrition & aerobic/resistive exercise along  with prescribed medications to achieve LDL 70mg , HDL >40mg .    Expected Outcomes Long Term: Cholesterol controlled with medications as prescribed, with individualized exercise RX and with personalized nutrition plan. Value goals: LDL < 70mg , HDL > 40 mg.;Short Term: Participant states understanding of desired cholesterol values and is compliant with medications prescribed. Participant is following exercise prescription and nutrition guidelines.             Core Components/Risk Factors/Patient Goals Review:   Goals and Risk Factor Review     Row Name 11/29/22 1352 12/27/22 1338           Core Components/Risk Factors/Patient Goals Review   Personal Goals Review Weight Management/Obesity;Lipids;Diabetes;Hypertension Weight Management/Obesity;Lipids;Diabetes;Hypertension      Review Larita Fife is doing well with exercise at intensive cardiac rehab. Vital signs and CBG's have been stable. Larita Fife is doing well with exercise at intensive cardiac rehab. Vital signs and CBG's have been stable. Larita Fife has lost 1.7 kg since starting cardiac rehab      Expected Outcomes Larita Fife will continue to participate in intensive cardiac rehab for exercise nutrition and lifestyle modifications Larita Fife will continue to participate in intensive cardiac rehab for exercise nutrition and lifestyle modifications               Core Components/Risk Factors/Patient Goals at Discharge (Final Review):   Goals and Risk Factor Review - 12/27/22 1338       Core Components/Risk Factors/Patient Goals Review   Personal Goals Review Weight Management/Obesity;Lipids;Diabetes;Hypertension    Review Larita Fife is doing well with exercise at intensive cardiac rehab. Vital signs and CBG's have been stable. Larita Fife has lost 1.7 kg since starting cardiac rehab    Expected Outcomes Larita Fife will continue to participate in intensive cardiac rehab for exercise nutrition and lifestyle modifications             ITP Comments:  ITP Comments     Row  Name 11/03/22 1044 11/29/22 1346 12/27/22 1334       ITP Comments Dr. Armanda Magic medical director. Introduction to [facility] education/ intensive cardiac rehab. Initial orientation packet reviewed with patient. 30 Day ITP REview. Larita Fife has good attendance and participation in intensive cardiac rehab 30 Day ITP REview. Larita Fife conrinues to have  good attendance and participation in intensive cardiac rehab. Larita Fife will complete intensive cardiac rehab on 01/13/23              Comments: See ITP Comments

## 2022-12-28 ENCOUNTER — Encounter (HOSPITAL_COMMUNITY)
Admission: RE | Admit: 2022-12-28 | Discharge: 2022-12-28 | Disposition: A | Discharge status: Home / Self Care | Payer: Medicare HMO | Source: Ambulatory Visit | Attending: Cardiology | Admitting: Cardiology

## 2022-12-28 ENCOUNTER — Ambulatory Visit (HOSPITAL_COMMUNITY): Payer: Medicare HMO

## 2022-12-28 DIAGNOSIS — Z9861 Coronary angioplasty status: Secondary | ICD-10-CM

## 2022-12-28 DIAGNOSIS — Z5189 Encounter for other specified aftercare: Secondary | ICD-10-CM | POA: Diagnosis not present

## 2022-12-28 DIAGNOSIS — I252 Old myocardial infarction: Secondary | ICD-10-CM | POA: Diagnosis not present

## 2022-12-28 DIAGNOSIS — Z951 Presence of aortocoronary bypass graft: Secondary | ICD-10-CM

## 2022-12-28 DIAGNOSIS — I214 Non-ST elevation (NSTEMI) myocardial infarction: Secondary | ICD-10-CM

## 2022-12-30 ENCOUNTER — Ambulatory Visit (HOSPITAL_COMMUNITY): Payer: Medicare HMO

## 2022-12-30 ENCOUNTER — Encounter (HOSPITAL_COMMUNITY)
Admission: RE | Admit: 2022-12-30 | Discharge: 2022-12-30 | Disposition: A | Discharge status: Home / Self Care | Payer: Medicare HMO | Source: Ambulatory Visit | Attending: Cardiology | Admitting: Cardiology

## 2022-12-30 DIAGNOSIS — Z9861 Coronary angioplasty status: Secondary | ICD-10-CM | POA: Diagnosis not present

## 2022-12-30 DIAGNOSIS — Z5189 Encounter for other specified aftercare: Secondary | ICD-10-CM | POA: Diagnosis not present

## 2022-12-30 DIAGNOSIS — I214 Non-ST elevation (NSTEMI) myocardial infarction: Secondary | ICD-10-CM

## 2022-12-30 DIAGNOSIS — I252 Old myocardial infarction: Secondary | ICD-10-CM | POA: Diagnosis not present

## 2022-12-30 DIAGNOSIS — Z951 Presence of aortocoronary bypass graft: Secondary | ICD-10-CM

## 2023-01-02 ENCOUNTER — Ambulatory Visit (HOSPITAL_COMMUNITY): Payer: Medicare HMO

## 2023-01-02 ENCOUNTER — Encounter (HOSPITAL_COMMUNITY)
Admission: RE | Admit: 2023-01-02 | Discharge: 2023-01-02 | Disposition: A | Discharge status: Home / Self Care | Payer: Medicare HMO | Source: Ambulatory Visit | Attending: Cardiology | Admitting: Cardiology

## 2023-01-02 DIAGNOSIS — Z9861 Coronary angioplasty status: Secondary | ICD-10-CM

## 2023-01-02 DIAGNOSIS — I252 Old myocardial infarction: Secondary | ICD-10-CM | POA: Diagnosis not present

## 2023-01-02 DIAGNOSIS — Z951 Presence of aortocoronary bypass graft: Secondary | ICD-10-CM

## 2023-01-02 DIAGNOSIS — Z5189 Encounter for other specified aftercare: Secondary | ICD-10-CM | POA: Diagnosis not present

## 2023-01-02 DIAGNOSIS — I214 Non-ST elevation (NSTEMI) myocardial infarction: Secondary | ICD-10-CM

## 2023-01-04 ENCOUNTER — Ambulatory Visit (HOSPITAL_COMMUNITY): Payer: Medicare HMO

## 2023-01-04 ENCOUNTER — Encounter (HOSPITAL_COMMUNITY)
Admission: RE | Admit: 2023-01-04 | Discharge: 2023-01-04 | Disposition: A | Discharge status: Home / Self Care | Payer: Medicare HMO | Source: Ambulatory Visit | Attending: Cardiology | Admitting: Cardiology

## 2023-01-04 DIAGNOSIS — Z9861 Coronary angioplasty status: Secondary | ICD-10-CM

## 2023-01-04 DIAGNOSIS — I214 Non-ST elevation (NSTEMI) myocardial infarction: Secondary | ICD-10-CM

## 2023-01-04 DIAGNOSIS — Z5189 Encounter for other specified aftercare: Secondary | ICD-10-CM | POA: Diagnosis not present

## 2023-01-04 DIAGNOSIS — I252 Old myocardial infarction: Secondary | ICD-10-CM | POA: Diagnosis not present

## 2023-01-04 DIAGNOSIS — Z951 Presence of aortocoronary bypass graft: Secondary | ICD-10-CM

## 2023-01-06 ENCOUNTER — Ambulatory Visit (HOSPITAL_COMMUNITY): Payer: Medicare HMO

## 2023-01-06 ENCOUNTER — Encounter (HOSPITAL_COMMUNITY)
Admission: RE | Admit: 2023-01-06 | Discharge: 2023-01-06 | Disposition: A | Discharge status: Home / Self Care | Payer: Medicare HMO | Source: Ambulatory Visit | Attending: Cardiology | Admitting: Cardiology

## 2023-01-06 DIAGNOSIS — Z951 Presence of aortocoronary bypass graft: Secondary | ICD-10-CM

## 2023-01-06 DIAGNOSIS — Z9861 Coronary angioplasty status: Secondary | ICD-10-CM | POA: Diagnosis not present

## 2023-01-06 DIAGNOSIS — I252 Old myocardial infarction: Secondary | ICD-10-CM | POA: Diagnosis not present

## 2023-01-06 DIAGNOSIS — Z5189 Encounter for other specified aftercare: Secondary | ICD-10-CM | POA: Diagnosis not present

## 2023-01-06 DIAGNOSIS — I214 Non-ST elevation (NSTEMI) myocardial infarction: Secondary | ICD-10-CM

## 2023-01-09 ENCOUNTER — Encounter (HOSPITAL_COMMUNITY)
Admission: RE | Admit: 2023-01-09 | Discharge: 2023-01-09 | Disposition: A | Discharge status: Home / Self Care | Payer: Medicare HMO | Source: Ambulatory Visit | Attending: Cardiology | Admitting: Cardiology

## 2023-01-09 ENCOUNTER — Ambulatory Visit (HOSPITAL_COMMUNITY): Payer: Medicare HMO

## 2023-01-09 DIAGNOSIS — Z9861 Coronary angioplasty status: Secondary | ICD-10-CM

## 2023-01-09 DIAGNOSIS — Z5189 Encounter for other specified aftercare: Secondary | ICD-10-CM | POA: Diagnosis not present

## 2023-01-09 DIAGNOSIS — Z951 Presence of aortocoronary bypass graft: Secondary | ICD-10-CM

## 2023-01-09 DIAGNOSIS — I214 Non-ST elevation (NSTEMI) myocardial infarction: Secondary | ICD-10-CM

## 2023-01-09 DIAGNOSIS — I252 Old myocardial infarction: Secondary | ICD-10-CM | POA: Diagnosis not present

## 2023-01-11 ENCOUNTER — Ambulatory Visit (HOSPITAL_COMMUNITY): Payer: Medicare HMO

## 2023-01-11 ENCOUNTER — Encounter (HOSPITAL_COMMUNITY)
Admission: RE | Admit: 2023-01-11 | Discharge: 2023-01-11 | Disposition: A | Discharge status: Home / Self Care | Payer: Medicare HMO | Source: Ambulatory Visit | Attending: Cardiology | Admitting: Cardiology

## 2023-01-11 DIAGNOSIS — Z5189 Encounter for other specified aftercare: Secondary | ICD-10-CM | POA: Diagnosis not present

## 2023-01-11 DIAGNOSIS — Z951 Presence of aortocoronary bypass graft: Secondary | ICD-10-CM

## 2023-01-11 DIAGNOSIS — I252 Old myocardial infarction: Secondary | ICD-10-CM | POA: Diagnosis not present

## 2023-01-11 DIAGNOSIS — I214 Non-ST elevation (NSTEMI) myocardial infarction: Secondary | ICD-10-CM

## 2023-01-11 DIAGNOSIS — Z9861 Coronary angioplasty status: Secondary | ICD-10-CM | POA: Diagnosis not present

## 2023-01-11 NOTE — Progress Notes (Addendum)
Discharge Progress Report  Patient Details  Name: Shane Ford MRN: [id number] Date of Birth: 08/16/1945 Referring Provider:   Flowsheet Row INTENSIVE CARDIAC REHAB ORIENT from 11/03/2022 in [facility]  Referring Provider Donato Schultz, MD        Number of Visits: 65  Reason for Discharge:  Patient reached a stable level of exercise. Patient independent in their exercise. Patient has met program and personal goals.  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  ADL [facility]:   Initial Exercise Prescription:  Initial Exercise Prescription - 11/03/22 1200       Date of Initial Exercise RX and Referring Provider   Date 11/03/22    Referring Provider Donato Schultz, MD    Expected Discharge Date 01/13/23      NuStep   Level 1    SPM 75    Minutes 15    METs 2      Track   Laps 18    Minutes 15    METs 2.5      Prescription Details   Frequency (times per week) 3    Duration Progress to 30 minutes of continuous aerobic without signs/symptoms of physical distress      Intensity   THRR 40-80% of Max Heartrate 58-115    Ratings of Perceived Exertion 11-13    Perceived Dyspnea 0-4      Progression   Progression Continue progressive overload as per policy without signs/symptoms or physical distress.      Resistance Training   Training Prescription Yes    Weight 3 lbs    Reps 10-15             Discharge Exercise Prescription (Final Exercise Prescription Changes):  Exercise Prescription Changes - 01/16/23 1022       Response to Exercise   Blood Pressure (Admit) 114/58    Blood Pressure (Exercise) 130/62    Blood Pressure (Exit) 102/68    Heart Rate (Admit) 62 bpm    Heart Rate  (Exercise) 101 bpm    Heart Rate (Exit) 68 bpm    Rating of Perceived Exertion (Exercise) 8    Perceived Dyspnea (Exercise) 0    Symptoms None    Comments Patient completed the cardiac rehab program today.    Duration Continue with 30 min of aerobic exercise without signs/symptoms of physical distress.    Intensity THRR unchanged      Progression   Progression Continue to progress workloads to maintain intensity without signs/symptoms of physical distress.    Average METs 3.8      Resistance Training   Training Prescription Yes    Weight 4 lbs    Reps 10-15    Time 10 Minutes      Interval Training   Interval Training No      Recumbant Bike   Level 4    RPM 60    Minutes 15    METs 4.4      Track   Laps 18   220 feet/lap   Minutes 15    METs 3.3      Home Exercise Plan   Plans to continue exercise at Home (comment)   Walking   Frequency Add 2 additional days to program exercise  sessions.    Initial Home Exercises Provided 12/09/22             Functional Capacity:  6 Minute Walk     Row Name 11/03/22 1224 01/04/23 1032       6 Minute Walk   Phase Initial Discharge    Distance 1200 feet 1608 feet    Distance % Change -- 34 %    Distance Feet Change -- 408 ft    Walk Time 6 minutes 6 minutes    # of Rest Breaks 0 0    MPH 2.27 3.04    METS 2.76 3.29    RPE 11 8    Perceived Dyspnea  1 0    VO2 Peak 9.65 11.52    Symptoms Yes (comment) No    Comments mild SOB, resolved with rest. no pain --    Resting HR 61 bpm 63 bpm    Resting BP 104/56 110/56    Resting Oxygen Saturation  98 % --    Exercise Oxygen Saturation  during 6 min walk 98 % --    Max Ex. HR 97 bpm 78 bpm    Max Ex. BP 120/58 122/62    2 Minute Post BP 110/60 103/67             6 Minute Walk     Row Name 11/03/22 1224 01/04/23 1032       6 Minute Walk   Phase Initial Discharge    Distance 1200 feet 1608 feet    Distance % Change -- 34 %    Distance Feet Change -- 408 ft     Walk Time 6 minutes 6 minutes    # of Rest Breaks 0 0    MPH 2.27 3.04    METS 2.76 3.29    RPE 11 8    Perceived Dyspnea  1 0    VO2 Peak 9.65 11.52    Symptoms Yes (comment) No    Comments mild SOB, resolved with rest. no pain --    Resting HR 61 bpm 63 bpm    Resting BP 104/56 110/56    Resting Oxygen Saturation  98 % --    Exercise Oxygen Saturation  during 6 min walk 98 % --    Max Ex. HR 97 bpm 78 bpm    Max Ex. BP 120/58 122/62    2 Minute Post BP 110/60 103/67             6 Minute Walk     Row Name 11/03/22 1224 01/04/23 1032       6 Minute Walk   Phase Initial Discharge    Distance 1200 feet 1608 feet    Distance % Change -- 34 %    Distance Feet Change -- 408 ft    Walk Time 6 minutes 6 minutes    # of Rest Breaks 0 0    MPH 2.27 3.04    METS 2.76 3.29    RPE 11 8    Perceived Dyspnea  1 0    VO2 Peak 9.65 11.52    Symptoms Yes (comment) No    Comments mild SOB, resolved with rest. no pain --    Resting HR 61 bpm 63 bpm    Resting BP 104/56 110/56    Resting Oxygen Saturation  98 % --    Exercise Oxygen Saturation  during 6 min walk 98 % --    Max Ex. HR 97 bpm  78 bpm    Max Ex. BP 120/58 122/62    2 Minute Post BP 110/60 103/67             6 Minute Walk     Row Name 11/03/22 1224 01/04/23 1032       6 Minute Walk   Phase Initial Discharge    Distance 1200 feet 1608 feet    Distance % Change -- 34 %    Distance Feet Change -- 408 ft    Walk Time 6 minutes 6 minutes    # of Rest Breaks 0 0    MPH 2.27 3.04    METS 2.76 3.29    RPE 11 8    Perceived Dyspnea  1 0    VO2 Peak 9.65 11.52    Symptoms Yes (comment) No    Comments mild SOB, resolved with rest. no pain --    Resting HR 61 bpm 63 bpm    Resting BP 104/56 110/56    Resting Oxygen Saturation  98 % --    Exercise Oxygen Saturation  during 6 min walk 98 % --    Max Ex. HR 97 bpm 78 bpm    Max Ex. BP 120/58 122/62    2 Minute Post BP 110/60 103/67             6 Minute  Walk     Row Name 11/03/22 1224 01/04/23 1032       6 Minute Walk   Phase Initial Discharge    Distance 1200 feet 1608 feet    Distance % Change -- 34 %    Distance Feet Change -- 408 ft    Walk Time 6 minutes 6 minutes    # of Rest Breaks 0 0    MPH 2.27 3.04    METS 2.76 3.29    RPE 11 8    Perceived Dyspnea  1 0    VO2 Peak 9.65 11.52    Symptoms Yes (comment) No    Comments mild SOB, resolved with rest. no pain --    Resting HR 61 bpm 63 bpm    Resting BP 104/56 110/56    Resting Oxygen Saturation  98 % --    Exercise Oxygen Saturation  during 6 min walk 98 % --    Max Ex. HR 97 bpm 78 bpm    Max Ex. BP 120/58 122/62    2 Minute Post BP 110/60 103/67             Psychological, QOL, Others - Outcomes: PHQ 2/9:    01/09/2023    5:15 PM 11/03/2022   12:24 PM 04/13/2021    2:34 PM  Depression screen PHQ 2/9  Decreased Interest 1 0 0  Down, Depressed, Hopeless 1 1 0  PHQ - 2 Score 2 1 0  Altered sleeping 0 0   Tired, decreased energy 1 0   Change in appetite 0 0   Feeling bad or failure about yourself  0 1   Trouble concentrating 0 1   Moving slowly or fidgety/restless 0 0   Suicidal thoughts 0 0   PHQ-9 Score 3 3   Difficult doing work/chores Somewhat difficult Somewhat difficult     Quality of Life:  Quality of Life - 01/09/23 1714       Quality of Life   Select Quality of Life      Quality of Life Scores   Health/Function Pre 22.7 %  Health/Function Post 22.13 %    Health/Function % Change -2.51 %    Socioeconomic Pre 27.21 %    Socioeconomic Post 26.71 %    Socioeconomic % Change  -1.84 %    Psych/Spiritual Pre 24.64 %    Psych/Spiritual Post 26.21 %    Psych/Spiritual % Change 6.37 %    Family Pre 28.8 %    Family Post 28.8 %    Family % Change 0 %    GLOBAL Pre 24.93 %    GLOBAL Post 24.9 %    GLOBAL % Change -0.12 %             Personal Goals: Goals established at orientation with interventions provided to work toward goal.   Personal Goals and Risk Factors at Admission - 11/03/22 1055       Core Components/Risk Factors/Patient Goals on Admission    Weight Management Yes;Weight Maintenance    Intervention Weight Management: Develop a combined nutrition and exercise program designed to reach desired caloric intake, while maintaining appropriate intake of nutrient and fiber, sodium and fats, and appropriate energy expenditure required for the weight goal.;Weight Management: Provide education and appropriate resources to help participant work on and attain dietary goals.    Expected Outcomes Short Term: Continue to assess and modify interventions until short term weight is achieved;Long Term: Adherence to nutrition and physical activity/exercise program aimed toward attainment of established weight goal;Weight Maintenance: Understanding of the daily nutrition guidelines, which includes 25-35% calories from fat, 7% or less cal from saturated fats, less than 200mg  cholesterol, less than 1.5gm of sodium, & 5 or more servings of fruits and vegetables daily;Understanding recommendations for meals to include 15-35% energy as protein, 25-35% energy from fat, 35-60% energy from carbohydrates, less than 200mg  of dietary cholesterol, 20-35 gm of total fiber daily;Understanding of distribution of calorie intake throughout the day with the consumption of 4-5 meals/snacks    Diabetes Yes    Intervention Provide education about signs/symptoms and action to take for hypo/hyperglycemia.;Provide education about proper nutrition, including hydration, and aerobic/resistive exercise prescription along with prescribed medications to achieve blood glucose in normal ranges: Fasting glucose 65-99 mg/dL    Expected Outcomes Short Term: Participant verbalizes understanding of the signs/symptoms and immediate care of hyper/hypoglycemia, proper foot care and importance of medication, aerobic/resistive exercise and nutrition plan for blood glucose  control.;Long Term: Attainment of HbA1C < 7%.    Hypertension Yes    Intervention Provide education on lifestyle modifcations including regular physical activity/exercise, weight management, moderate sodium restriction and increased consumption of fresh fruit, vegetables, and low fat dairy, alcohol moderation, and smoking cessation.;Monitor prescription use compliance.    Expected Outcomes Short Term: Continued assessment and intervention until BP is < 140/40mm HG in hypertensive participants. < 130/62mm HG in hypertensive participants with diabetes, heart failure or chronic kidney disease.;Long Term: Maintenance of blood pressure at goal levels.    Lipids Yes    Intervention Provide education and support for participant on nutrition & aerobic/resistive exercise along with prescribed medications to achieve LDL 70mg , HDL >40mg .    Expected Outcomes Long Term: Cholesterol controlled with medications as prescribed, with individualized exercise RX and with personalized nutrition plan. Value goals: LDL < 70mg , HDL > 40 mg.;Short Term: Participant states understanding of desired cholesterol values and is compliant with medications prescribed. Participant is following exercise prescription and nutrition guidelines.              Personal Goals Discharge:  Goals and Risk Factor  Review     Row Name 11/29/22 1352 12/27/22 1338 01/18/23 1329         Core Components/Risk Factors/Patient Goals Review   Personal Goals Review Weight Management/Obesity;Lipids;Diabetes;Hypertension Weight Management/Obesity;Lipids;Diabetes;Hypertension Weight Management/Obesity;Lipids;Diabetes;Hypertension     Review Shane Ford is doing well with exercise at intensive cardiac rehab. Vital signs and CBG's have been stable. Shane Ford is doing well with exercise at intensive cardiac rehab. Vital signs and CBG's have been stable. Shane Ford has lost 1.7 kg since starting cardiac rehab Shane Ford did  well with exercise at intensive cardiac rehab. Vital  signs and CBG's were stable. Shane Ford has lost 1.5 kg since starting cardiac rehab. Shane Ford completed cardiac rehab on 01/16/23.     Expected Outcomes Shane Ford will continue to participate in intensive cardiac rehab for exercise nutrition and lifestyle modifications Shane Ford will continue to participate in intensive cardiac rehab for exercise nutrition and lifestyle modifications Shane Ford will continue to exercise follow  nutrition and lifestyle modifications upon completion of cardiac rehab              Goals and Risk Factor Review     Row Name 11/29/22 1352 12/27/22 1338 01/18/23 1329         Core Components/Risk Factors/Patient Goals Review   Personal Goals Review Weight Management/Obesity;Lipids;Diabetes;Hypertension Weight Management/Obesity;Lipids;Diabetes;Hypertension Weight Management/Obesity;Lipids;Diabetes;Hypertension     Review Shane Ford is doing well with exercise at intensive cardiac rehab. Vital signs and CBG's have been stable. Shane Ford is doing well with exercise at intensive cardiac rehab. Vital signs and CBG's have been stable. Shane Ford has lost 1.7 kg since starting cardiac rehab Shane Ford did  well with exercise at intensive cardiac rehab. Vital signs and CBG's were stable. Shane Ford has lost 1.5 kg since starting cardiac rehab. Shane Ford completed cardiac rehab on 01/16/23.     Expected Outcomes Shane Ford will continue to participate in intensive cardiac rehab for exercise nutrition and lifestyle modifications Shane Ford will continue to participate in intensive cardiac rehab for exercise nutrition and lifestyle modifications Shane Ford will continue to exercise follow  nutrition and lifestyle modifications upon completion of cardiac rehab              Goals and Risk Factor Review     Row Name 11/29/22 1352 12/27/22 1338 01/18/23 1329         Core Components/Risk Factors/Patient Goals Review   Personal Goals Review Weight Management/Obesity;Lipids;Diabetes;Hypertension Weight Management/Obesity;Lipids;Diabetes;Hypertension  Weight Management/Obesity;Lipids;Diabetes;Hypertension     Review Shane Ford is doing well with exercise at intensive cardiac rehab. Vital signs and CBG's have been stable. Shane Ford is doing well with exercise at intensive cardiac rehab. Vital signs and CBG's have been stable. Shane Ford has lost 1.7 kg since starting cardiac rehab Shane Ford did  well with exercise at intensive cardiac rehab. Vital signs and CBG's were stable. Shane Ford has lost 1.5 kg since starting cardiac rehab. Shane Ford completed cardiac rehab on 01/16/23.     Expected Outcomes Shane Ford will continue to participate in intensive cardiac rehab for exercise nutrition and lifestyle modifications Shane Ford will continue to participate in intensive cardiac rehab for exercise nutrition and lifestyle modifications Shane Ford will continue to exercise follow  nutrition and lifestyle modifications upon completion of cardiac rehab              Goals and Risk Factor Review     Row Name 11/29/22 1352 12/27/22 1338 01/18/23 1329         Core Components/Risk Factors/Patient Goals Review   Personal Goals Review Weight Management/Obesity;Lipids;Diabetes;Hypertension Weight Management/Obesity;Lipids;Diabetes;Hypertension Weight Management/Obesity;Lipids;Diabetes;Hypertension     Review Shane Ford  is doing well with exercise at intensive cardiac rehab. Vital signs and CBG's have been stable. Shane Ford is doing well with exercise at intensive cardiac rehab. Vital signs and CBG's have been stable. Shane Ford has lost 1.7 kg since starting cardiac rehab Shane Ford did  well with exercise at intensive cardiac rehab. Vital signs and CBG's were stable. Shane Ford has lost 1.5 kg since starting cardiac rehab. Shane Ford completed cardiac rehab on 01/16/23.     Expected Outcomes Shane Ford will continue to participate in intensive cardiac rehab for exercise nutrition and lifestyle modifications Shane Ford will continue to participate in intensive cardiac rehab for exercise nutrition and lifestyle modifications Shane Ford will continue to exercise  follow  nutrition and lifestyle modifications upon completion of cardiac rehab              Exercise Goals and Review:  Exercise Goals     Row Name 11/03/22 1054             Exercise Goals   Increase Physical Activity Yes       Intervention Provide advice, education, support and counseling about physical activity/exercise needs.;Develop an individualized exercise prescription for aerobic and resistive training based on initial evaluation findings, risk stratification, comorbidities and participant's personal goals.       Expected Outcomes Short Term: Attend rehab on a regular basis to increase amount of physical activity.;Long Term: Exercising regularly at least 3-5 days a week.;Long Term: Add in home exercise to make exercise part of routine and to increase amount of physical activity.       Increase Strength and Stamina Yes       Intervention Provide advice, education, support and counseling about physical activity/exercise needs.;Develop an individualized exercise prescription for aerobic and resistive training based on initial evaluation findings, risk stratification, comorbidities and participant's personal goals.       Expected Outcomes Short Term: Increase workloads from initial exercise prescription for resistance, speed, and METs.;Short Term: Perform resistance training exercises routinely during rehab and add in resistance training at home;Long Term: Improve cardiorespiratory fitness, muscular endurance and strength as measured by increased METs and functional capacity ( )       Able to understand and use rate of perceived exertion (RPE) scale Yes       Intervention Provide education and explanation on how to use RPE scale       Expected Outcomes Short Term: Able to use RPE daily in rehab to express subjective intensity level;Long Term:  Able to use RPE to guide intensity level when exercising independently       Knowledge and understanding of Target Heart Rate Range (THRR)  Yes       Intervention Provide education and explanation of THRR including how the numbers were predicted and where they are located for reference       Expected Outcomes Short Term: Able to state/look up THRR;Short Term: Able to use daily as guideline for intensity in rehab;Long Term: Able to use THRR to govern intensity when exercising independently       Understanding of Exercise Prescription Yes       Intervention Provide education, explanation, and written materials on patient's individual exercise prescription       Expected Outcomes Short Term: Able to explain program exercise prescription;Long Term: Able to explain home exercise prescription to exercise independently                Exercise Goals     Row Name 11/03/22 1054  Exercise Goals   Increase Physical Activity Yes       Intervention Provide advice, education, support and counseling about physical activity/exercise needs.;Develop an individualized exercise prescription for aerobic and resistive training based on initial evaluation findings, risk stratification, comorbidities and participant's personal goals.       Expected Outcomes Short Term: Attend rehab on a regular basis to increase amount of physical activity.;Long Term: Exercising regularly at least 3-5 days a week.;Long Term: Add in home exercise to make exercise part of routine and to increase amount of physical activity.       Increase Strength and Stamina Yes       Intervention Provide advice, education, support and counseling about physical activity/exercise needs.;Develop an individualized exercise prescription for aerobic and resistive training based on initial evaluation findings, risk stratification, comorbidities and participant's personal goals.       Expected Outcomes Short Term: Increase workloads from initial exercise prescription for resistance, speed, and METs.;Short Term: Perform resistance training exercises routinely during rehab and add in  resistance training at home;Long Term: Improve cardiorespiratory fitness, muscular endurance and strength as measured by increased METs and functional capacity ( )       Able to understand and use rate of perceived exertion (RPE) scale Yes       Intervention Provide education and explanation on how to use RPE scale       Expected Outcomes Short Term: Able to use RPE daily in rehab to express subjective intensity level;Long Term:  Able to use RPE to guide intensity level when exercising independently       Knowledge and understanding of Target Heart Rate Range (THRR) Yes       Intervention Provide education and explanation of THRR including how the numbers were predicted and where they are located for reference       Expected Outcomes Short Term: Able to state/look up THRR;Short Term: Able to use daily as guideline for intensity in rehab;Long Term: Able to use THRR to govern intensity when exercising independently       Understanding of Exercise Prescription Yes       Intervention Provide education, explanation, and written materials on patient's individual exercise prescription       Expected Outcomes Short Term: Able to explain program exercise prescription;Long Term: Able to explain home exercise prescription to exercise independently                Exercise Goals     Row Name 11/03/22 1054             Exercise Goals   Increase Physical Activity Yes       Intervention Provide advice, education, support and counseling about physical activity/exercise needs.;Develop an individualized exercise prescription for aerobic and resistive training based on initial evaluation findings, risk stratification, comorbidities and participant's personal goals.       Expected Outcomes Short Term: Attend rehab on a regular basis to increase amount of physical activity.;Long Term: Exercising regularly at least 3-5 days a week.;Long Term: Add in home exercise to make exercise part of routine and to  increase amount of physical activity.       Increase Strength and Stamina Yes       Intervention Provide advice, education, support and counseling about physical activity/exercise needs.;Develop an individualized exercise prescription for aerobic and resistive training based on initial evaluation findings, risk stratification, comorbidities and participant's personal goals.       Expected Outcomes Short Term: Increase workloads from initial exercise prescription  for resistance, speed, and METs.;Short Term: Perform resistance training exercises routinely during rehab and add in resistance training at home;Long Term: Improve cardiorespiratory fitness, muscular endurance and strength as measured by increased METs and functional capacity ( )       Able to understand and use rate of perceived exertion (RPE) scale Yes       Intervention Provide education and explanation on how to use RPE scale       Expected Outcomes Short Term: Able to use RPE daily in rehab to express subjective intensity level;Long Term:  Able to use RPE to guide intensity level when exercising independently       Knowledge and understanding of Target Heart Rate Range (THRR) Yes       Intervention Provide education and explanation of THRR including how the numbers were predicted and where they are located for reference       Expected Outcomes Short Term: Able to state/look up THRR;Short Term: Able to use daily as guideline for intensity in rehab;Long Term: Able to use THRR to govern intensity when exercising independently       Understanding of Exercise Prescription Yes       Intervention Provide education, explanation, and written materials on patient's individual exercise prescription       Expected Outcomes Short Term: Able to explain program exercise prescription;Long Term: Able to explain home exercise prescription to exercise independently                Exercise Goals     Row Name 11/03/22 1054             Exercise  Goals   Increase Physical Activity Yes       Intervention Provide advice, education, support and counseling about physical activity/exercise needs.;Develop an individualized exercise prescription for aerobic and resistive training based on initial evaluation findings, risk stratification, comorbidities and participant's personal goals.       Expected Outcomes Short Term: Attend rehab on a regular basis to increase amount of physical activity.;Long Term: Exercising regularly at least 3-5 days a week.;Long Term: Add in home exercise to make exercise part of routine and to increase amount of physical activity.       Increase Strength and Stamina Yes       Intervention Provide advice, education, support and counseling about physical activity/exercise needs.;Develop an individualized exercise prescription for aerobic and resistive training based on initial evaluation findings, risk stratification, comorbidities and participant's personal goals.       Expected Outcomes Short Term: Increase workloads from initial exercise prescription for resistance, speed, and METs.;Short Term: Perform resistance training exercises routinely during rehab and add in resistance training at home;Long Term: Improve cardiorespiratory fitness, muscular endurance and strength as measured by increased METs and functional capacity ( )       Able to understand and use rate of perceived exertion (RPE) scale Yes       Intervention Provide education and explanation on how to use RPE scale       Expected Outcomes Short Term: Able to use RPE daily in rehab to express subjective intensity level;Long Term:  Able to use RPE to guide intensity level when exercising independently       Knowledge and understanding of Target Heart Rate Range (THRR) Yes       Intervention Provide education and explanation of THRR including how the numbers were predicted and where they are located for reference       Expected Outcomes Short Term: Able to  state/look up THRR;Short Term: Able to use daily as guideline for intensity in rehab;Long Term: Able to use THRR to govern intensity when exercising independently       Understanding of Exercise Prescription Yes       Intervention Provide education, explanation, and written materials on patient's individual exercise prescription       Expected Outcomes Short Term: Able to explain program exercise prescription;Long Term: Able to explain home exercise prescription to exercise independently                Exercise Goals     Row Name 11/03/22 1054             Exercise Goals   Increase Physical Activity Yes       Intervention Provide advice, education, support and counseling about physical activity/exercise needs.;Develop an individualized exercise prescription for aerobic and resistive training based on initial evaluation findings, risk stratification, comorbidities and participant's personal goals.       Expected Outcomes Short Term: Attend rehab on a regular basis to increase amount of physical activity.;Long Term: Exercising regularly at least 3-5 days a week.;Long Term: Add in home exercise to make exercise part of routine and to increase amount of physical activity.       Increase Strength and Stamina Yes       Intervention Provide advice, education, support and counseling about physical activity/exercise needs.;Develop an individualized exercise prescription for aerobic and resistive training based on initial evaluation findings, risk stratification, comorbidities and participant's personal goals.       Expected Outcomes Short Term: Increase workloads from initial exercise prescription for resistance, speed, and METs.;Short Term: Perform resistance training exercises routinely during rehab and add in resistance training at home;Long Term: Improve cardiorespiratory fitness, muscular endurance and strength as measured by increased METs and functional capacity ( )       Able to understand  and use rate of perceived exertion (RPE) scale Yes       Intervention Provide education and explanation on how to use RPE scale       Expected Outcomes Short Term: Able to use RPE daily in rehab to express subjective intensity level;Long Term:  Able to use RPE to guide intensity level when exercising independently       Knowledge and understanding of Target Heart Rate Range (THRR) Yes       Intervention Provide education and explanation of THRR including how the numbers were predicted and where they are located for reference       Expected Outcomes Short Term: Able to state/look up THRR;Short Term: Able to use daily as guideline for intensity in rehab;Long Term: Able to use THRR to govern intensity when exercising independently       Understanding of Exercise Prescription Yes       Intervention Provide education, explanation, and written materials on patient's individual exercise prescription       Expected Outcomes Short Term: Able to explain program exercise prescription;Long Term: Able to explain home exercise prescription to exercise independently                Exercise Goals Re-Evaluation:  Exercise Goals Re-Evaluation     Row Name 11/11/22 1405 12/09/22 1103 01/04/23 1045 01/16/23 1133       Exercise Goal Re-Evaluation   Exercise Goals Review Increase Strength and Stamina;Increase Physical Activity;Able to understand and use rate of perceived exertion (RPE) scale;Knowledge and understanding of Target Heart Rate Range (THRR);Understanding of Exercise Prescription Increase Strength and Stamina;Increase  Physical Activity;Able to understand and use rate of perceived exertion (RPE) scale;Knowledge and understanding of Target Heart Rate Range (THRR);Understanding of Exercise Prescription Increase Strength and Stamina;Increase Physical Activity;Able to understand and use rate of perceived exertion (RPE) scale;Knowledge and understanding of Target Heart Rate Range (THRR);Understanding of  Exercise Prescription Increase Strength and Stamina;Increase Physical Activity;Able to understand and use rate of perceived exertion (RPE) scale;Knowledge and understanding of Target Heart Rate Range (THRR);Understanding of Exercise Prescription    Comments Pt first day in CRP2 program. Pt tolerated exercise well with an avg MET level of 2.59. Pt is learning his THRR and RPE scale. Pt understands his ExRx. Pt was encouraged to focus on picking up his feet during walking track to avoid shuffled gait. Reviewd exercise prescription with Shane Ford. He is currently walking 15-20 minutes, 2 days/week. Discussed increasing duration gradually 1-2 minutes each session with a goal of reaching 30 minutes, 2 days/week. Patient has 8 lb hand weights that he uses for resistance training. Shane Ford is sheduled to complete cardiac rehab next week and is progressing well. His functional capacity increased 34% as measured by . He plans to continue walking 45 minutes at least 3 days/week as his mode of exercise. He has 8 lb weights at home but plans to get 5 lb weights to use for his resistance training. His balance imporved from 4.68 seconds to 30 seconds as measured by the single-leg stand test. Shane Ford completed the cardiac rehab program today and progressed well achieving 3.8 METs with exercise. He will continue walking at home as his mode of exercise at this time.    Expected Outcomes Will continue to progress workloads as tolerated without s/sx. Shane Ford will increase duration of walking at home to 30 minutes 2 days/week in addition to exercise at cardiac rehab to achieve 150 minutes of aerobic exercise per week. Shane Ford walk 45 minutes at least 3 days/week and use his weights 2-3 days/week as his mode of exercise. Shane Ford walk 45 minutes at least 3 days/week and use his weights 2-3 days/week to maintain health and fitness gains.             Exercise Goals Re-Evaluation     Row Name 11/11/22 1405 12/09/22 1103 01/04/23 1045 01/16/23  1133       Exercise Goal Re-Evaluation   Exercise Goals Review Increase Strength and Stamina;Increase Physical Activity;Able to understand and use rate of perceived exertion (RPE) scale;Knowledge and understanding of Target Heart Rate Range (THRR);Understanding of Exercise Prescription Increase Strength and Stamina;Increase Physical Activity;Able to understand and use rate of perceived exertion (RPE) scale;Knowledge and understanding of Target Heart Rate Range (THRR);Understanding of Exercise Prescription Increase Strength and Stamina;Increase Physical Activity;Able to understand and use rate of perceived exertion (RPE) scale;Knowledge and understanding of Target Heart Rate Range (THRR);Understanding of Exercise Prescription Increase Strength and Stamina;Increase Physical Activity;Able to understand and use rate of perceived exertion (RPE) scale;Knowledge and understanding of Target Heart Rate Range (THRR);Understanding of Exercise Prescription    Comments Pt first day in CRP2 program. Pt tolerated exercise well with an avg MET level of 2.59. Pt is learning his THRR and RPE scale. Pt understands his ExRx. Pt was encouraged to focus on picking up his feet during walking track to avoid shuffled gait. Reviewd exercise prescription with Shane Ford. He is currently walking 15-20 minutes, 2 days/week. Discussed increasing duration gradually 1-2 minutes each session with a goal of reaching 30 minutes, 2 days/week. Patient has 8 lb hand weights that he uses for resistance training. Shane Ford  is sheduled to complete cardiac rehab next week and is progressing well. His functional capacity increased 34% as measured by . He plans to continue walking 45 minutes at least 3 days/week as his mode of exercise. He has 8 lb weights at home but plans to get 5 lb weights to use for his resistance training. His balance imporved from 4.68 seconds to 30 seconds as measured by the single-leg stand test. Shane Ford completed the cardiac rehab  program today and progressed well achieving 3.8 METs with exercise. He will continue walking at home as his mode of exercise at this time.    Expected Outcomes Will continue to progress workloads as tolerated without s/sx. Shane Ford will increase duration of walking at home to 30 minutes 2 days/week in addition to exercise at cardiac rehab to achieve 150 minutes of aerobic exercise per week. Shane Ford walk 45 minutes at least 3 days/week and use his weights 2-3 days/week as his mode of exercise. Shane Ford walk 45 minutes at least 3 days/week and use his weights 2-3 days/week to maintain health and fitness gains.             Exercise Goals Re-Evaluation     Row Name 11/11/22 1405 12/09/22 1103 01/04/23 1045 01/16/23 1133       Exercise Goal Re-Evaluation   Exercise Goals Review Increase Strength and Stamina;Increase Physical Activity;Able to understand and use rate of perceived exertion (RPE) scale;Knowledge and understanding of Target Heart Rate Range (THRR);Understanding of Exercise Prescription Increase Strength and Stamina;Increase Physical Activity;Able to understand and use rate of perceived exertion (RPE) scale;Knowledge and understanding of Target Heart Rate Range (THRR);Understanding of Exercise Prescription Increase Strength and Stamina;Increase Physical Activity;Able to understand and use rate of perceived exertion (RPE) scale;Knowledge and understanding of Target Heart Rate Range (THRR);Understanding of Exercise Prescription Increase Strength and Stamina;Increase Physical Activity;Able to understand and use rate of perceived exertion (RPE) scale;Knowledge and understanding of Target Heart Rate Range (THRR);Understanding of Exercise Prescription    Comments Pt first day in CRP2 program. Pt tolerated exercise well with an avg MET level of 2.59. Pt is learning his THRR and RPE scale. Pt understands his ExRx. Pt was encouraged to focus on picking up his feet during walking track to avoid shuffled gait.  Reviewd exercise prescription with Shane Ford. He is currently walking 15-20 minutes, 2 days/week. Discussed increasing duration gradually 1-2 minutes each session with a goal of reaching 30 minutes, 2 days/week. Patient has 8 lb hand weights that he uses for resistance training. Shane Ford is sheduled to complete cardiac rehab next week and is progressing well. His functional capacity increased 34% as measured by . He plans to continue walking 45 minutes at least 3 days/week as his mode of exercise. He has 8 lb weights at home but plans to get 5 lb weights to use for his resistance training. His balance imporved from 4.68 seconds to 30 seconds as measured by the single-leg stand test. Shane Ford completed the cardiac rehab program today and progressed well achieving 3.8 METs with exercise. He will continue walking at home as his mode of exercise at this time.    Expected Outcomes Will continue to progress workloads as tolerated without s/sx. Shane Ford will increase duration of walking at home to 30 minutes 2 days/week in addition to exercise at cardiac rehab to achieve 150 minutes of aerobic exercise per week. Shane Ford walk 45 minutes at least 3 days/week and use his weights 2-3 days/week as his mode of exercise. Shane Ford walk 45 minutes at least  3 days/week and use his weights 2-3 days/week to maintain health and fitness gains.             Exercise Goals Re-Evaluation     Row Name 11/11/22 1405 12/09/22 1103 01/04/23 1045 01/16/23 1133       Exercise Goal Re-Evaluation   Exercise Goals Review Increase Strength and Stamina;Increase Physical Activity;Able to understand and use rate of perceived exertion (RPE) scale;Knowledge and understanding of Target Heart Rate Range (THRR);Understanding of Exercise Prescription Increase Strength and Stamina;Increase Physical Activity;Able to understand and use rate of perceived exertion (RPE) scale;Knowledge and understanding of Target Heart Rate Range (THRR);Understanding of Exercise  Prescription Increase Strength and Stamina;Increase Physical Activity;Able to understand and use rate of perceived exertion (RPE) scale;Knowledge and understanding of Target Heart Rate Range (THRR);Understanding of Exercise Prescription Increase Strength and Stamina;Increase Physical Activity;Able to understand and use rate of perceived exertion (RPE) scale;Knowledge and understanding of Target Heart Rate Range (THRR);Understanding of Exercise Prescription    Comments Pt first day in CRP2 program. Pt tolerated exercise well with an avg MET level of 2.59. Pt is learning his THRR and RPE scale. Pt understands his ExRx. Pt was encouraged to focus on picking up his feet during walking track to avoid shuffled gait. Reviewd exercise prescription with Shane Ford. He is currently walking 15-20 minutes, 2 days/week. Discussed increasing duration gradually 1-2 minutes each session with a goal of reaching 30 minutes, 2 days/week. Patient has 8 lb hand weights that he uses for resistance training. Shane Ford is sheduled to complete cardiac rehab next week and is progressing well. His functional capacity increased 34% as measured by . He plans to continue walking 45 minutes at least 3 days/week as his mode of exercise. He has 8 lb weights at home but plans to get 5 lb weights to use for his resistance training. His balance imporved from 4.68 seconds to 30 seconds as measured by the single-leg stand test. Shane Ford completed the cardiac rehab program today and progressed well achieving 3.8 METs with exercise. He will continue walking at home as his mode of exercise at this time.    Expected Outcomes Will continue to progress workloads as tolerated without s/sx. Shane Ford will increase duration of walking at home to 30 minutes 2 days/week in addition to exercise at cardiac rehab to achieve 150 minutes of aerobic exercise per week. Shane Ford walk 45 minutes at least 3 days/week and use his weights 2-3 days/week as his mode of exercise. Shane Ford walk 45  minutes at least 3 days/week and use his weights 2-3 days/week to maintain health and fitness gains.             Nutrition & Weight - Outcomes:  Pre Biometrics - 11/03/22 1030       Pre Biometrics   Waist Circumference 38 inches    Hip Circumference 38.25 inches    Waist to Hip Ratio 0.99 %    Triceps Skinfold 24 mm    % Body Fat 26.5 %    Grip Strength 38 kg    Flexibility 0 in   could not reach   Single Leg Stand 4.68 seconds             Post Biometrics - 01/16/23 1034        Post  Biometrics   Height 6\' 3"  (1.905 m)    Waist Circumference 37 inches    Hip Circumference 37.75 inches    Waist to Hip Ratio 0.98 %    BMI (Calculated) 21.88  Triceps Skinfold 13.5 mm    % Body Fat 23.4 %    Grip Strength 37 kg    Flexibility 0 in    Single Leg Stand 30 seconds             Nutrition:  Nutrition Therapy & Goals - 01/11/23 0943       Nutrition Therapy   Diet Heart healthy/carbohydrate consistent diet    Drug/Food Interactions Statins/Certain Fruits      Personal Nutrition Goals   Nutrition Goal Patient to identify strategies for reducing cardiovascular risk by attending the [employer] education and nutrition series weekly.   goal in action.   Personal Goal #2 Patient to improve diet quality by using the plate method as a guide for meal planning to include lean protein/plant protein, fruits, vegetables, whole grains, nonfat dairy as part of a well-balanced diet.   goal in action.   Comments Goals in action. Shane Ford continues to attend the [employer] and nutrition series regularly. Shane Ford has previously completed cardiac rehab in 2022. He is down 3.3# since starting with our program. He continues to work toward a heart healthy lifestyle including reduced sodium intake, decreased saturated fat intake, and increased dietary fiber. Patient will benefit from participation in intensive cardiac rehab for nutrition, exercise, and lifestyle modification.       Intervention Plan   Intervention Prescribe, educate and counsel regarding individualized specific dietary modifications aiming towards targeted core components such as weight, hypertension, lipid management, diabetes, heart failure and other comorbidities.;Nutrition handout(s) given to patient.    Expected Outcomes Short Term Goal: Understand basic principles of dietary content, such as calories, fat, sodium, cholesterol and nutrients.;Long Term Goal: Adherence to prescribed nutrition plan.             Nutrition Discharge:  Nutrition Assessments - 01/09/23 1440       Rate Your Plate Scores   Pre Score 68    Post Score 85             Education Questionnaire Score:  Knowledge Questionnaire Score - 01/09/23 1715       Knowledge Questionnaire Score   Pre Score 19/24    Post Score 21/24             Goals reviewed with patient; copy given to patient.Pt graduates from  Intensive/Traditional cardiac rehab program on 01/16/23  with completion of  25 exercise and  28 education sessions. Pt maintained good attendance and progressed nicely during their participation in rehab as evidenced by increased MET level. Shane Ford increased his distance on his post exercise walk test by 408 feet. Shane Ford lost 1.5 kg while in the program.   Medication list reconciled. Repeat  PHQ score-3  .  Pt has made significant lifestyle changes and should be commended for their success. Shane Ford achieved his  goals during cardiac rehab.   Pt plans to continue exercise at home following his exercise plan and dietary changes. We are proud of Shane Ford's progress!

## 2023-01-13 ENCOUNTER — Encounter (HOSPITAL_COMMUNITY): Payer: Medicare HMO

## 2023-01-13 ENCOUNTER — Ambulatory Visit (HOSPITAL_COMMUNITY): Payer: Medicare HMO

## 2023-01-13 ENCOUNTER — Telehealth (HOSPITAL_COMMUNITY): Payer: Self-pay | Admitting: *Deleted

## 2023-01-13 NOTE — Telephone Encounter (Signed)
Spoke with Shane Ford classes are cancelled due to [employer] issues. Shane Ford plans to return to exercise on Monday to complete cardiac rehab.Thayer Headings RN BSN

## 2023-01-16 ENCOUNTER — Encounter (HOSPITAL_COMMUNITY)
Admission: RE | Admit: 2023-01-16 | Discharge: 2023-01-16 | Disposition: A | Discharge status: Home / Self Care | Payer: Medicare HMO | Source: Ambulatory Visit | Attending: Cardiology | Admitting: Cardiology

## 2023-01-16 VITALS — BP 114/58 | HR 62 | Ht 75.0 in | Wt 175.0 lb

## 2023-01-16 DIAGNOSIS — Z951 Presence of aortocoronary bypass graft: Secondary | ICD-10-CM

## 2023-01-16 DIAGNOSIS — I214 Non-ST elevation (NSTEMI) myocardial infarction: Secondary | ICD-10-CM

## 2023-01-16 DIAGNOSIS — I252 Old myocardial infarction: Secondary | ICD-10-CM | POA: Diagnosis not present

## 2023-01-16 DIAGNOSIS — Z9861 Coronary angioplasty status: Secondary | ICD-10-CM

## 2023-01-16 DIAGNOSIS — Z5189 Encounter for other specified aftercare: Secondary | ICD-10-CM | POA: Diagnosis not present

## 2023-01-18 ENCOUNTER — Ambulatory Visit: Payer: Medicare HMO | Attending: Nurse Practitioner

## 2023-01-18 DIAGNOSIS — Z950 Presence of cardiac pacemaker: Secondary | ICD-10-CM

## 2023-01-18 DIAGNOSIS — E785 Hyperlipidemia, unspecified: Secondary | ICD-10-CM

## 2023-01-18 DIAGNOSIS — I442 Atrioventricular block, complete: Secondary | ICD-10-CM

## 2023-01-18 DIAGNOSIS — I10 Essential (primary) hypertension: Secondary | ICD-10-CM | POA: Diagnosis not present

## 2023-01-19 LAB — CBC
Hematocrit: 44.3 % (ref 37.5–51.0)
Hemoglobin: 14.4 g/dL (ref 13.0–17.7)
MCH: 29.4 pg (ref 26.6–33.0)
MCHC: 32.5 g/dL (ref 31.5–35.7)
MCV: 90 fL (ref 79–97)
Platelets: 188 10*3/uL (ref 150–450)
RBC: 4.9 x10E6/uL (ref 4.14–5.80)
RDW: 15 % (ref 11.6–15.4)
WBC: 4.8 10*3/uL (ref 3.4–10.8)

## 2023-01-19 LAB — BASIC METABOLIC PANEL
BUN/Creatinine Ratio: 16 (ref 10–24)
BUN: 20 mg/dL (ref 8–27)
CO2: 25 mmol/L (ref 20–29)
Calcium: 9.8 mg/dL (ref 8.6–10.2)
Chloride: 105 mmol/L (ref 96–106)
Glucose: 110 mg/dL — ABNORMAL HIGH (ref 70–99)
Potassium: 4.9 mmol/L (ref 3.5–5.2)
Sodium: 142 mmol/L (ref 134–144)
eGFR: 60 mL/min/{1.73_m2} (ref >59)

## 2023-01-19 LAB — LIPID PANEL
Chol/HDL Ratio: 2.7 ratio (ref 0.0–5.0)
Cholesterol, Total: 109 mg/dL (ref 100–199)
LDL Chol Calc (NIH): 52 mg/dL (ref 0–99)
Triglycerides: 82 mg/dL (ref 0–149)
VLDL Cholesterol Cal: 16 mg/dL (ref 5–40)

## 2023-01-23 ENCOUNTER — Ambulatory Visit: Payer: Medicare HMO | Attending: Cardiology | Admitting: Cardiology

## 2023-01-23 ENCOUNTER — Encounter: Payer: Self-pay | Admitting: Cardiology

## 2023-01-23 VITALS — BP 128/78 | HR 67 | Ht 75.0 in | Wt 174.0 lb

## 2023-01-23 DIAGNOSIS — I2510 Atherosclerotic heart disease of native coronary artery without angina pectoris: Secondary | ICD-10-CM | POA: Diagnosis not present

## 2023-01-23 DIAGNOSIS — Z950 Presence of cardiac pacemaker: Secondary | ICD-10-CM

## 2023-01-23 DIAGNOSIS — I442 Atrioventricular block, complete: Secondary | ICD-10-CM | POA: Diagnosis not present

## 2023-01-23 DIAGNOSIS — Z951 Presence of aortocoronary bypass graft: Secondary | ICD-10-CM | POA: Diagnosis not present

## 2023-01-23 DIAGNOSIS — I493 Ventricular premature depolarization: Secondary | ICD-10-CM

## 2023-01-23 DIAGNOSIS — E785 Hyperlipidemia, unspecified: Secondary | ICD-10-CM | POA: Diagnosis not present

## 2023-01-23 NOTE — Progress Notes (Signed)
  Cardiology Office Note:  .   Date:  01/23/2023  ID:  Shane Ford, DOB 22-Oct-1945, MRN [id number] PCP: Danella Penton, MD   [facility] Providers Cardiologist:  Donato Schultz, MD    History of Present Illness: .   Shane Ford is a 77 y.o. male former patient of Dr. Sherilyn Cooter Smith's seen last on 10/28/2022 by Eligha Bridegroom, NP here for 27-month follow-up.  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  Plan was to continue with Brilinta and aspirin for 12 months and after that long-term clopidogrel therapy. Hypokalemia-HCTZ stopped  He is still feeling fatigue on the metoprolol.  Will do his walk at home and then come home and take a nap.  Prior anginal symptoms were severe chest pain radiating down left arm with diaphoresis and vomiting.  No further episodes.  Blood pressure under good control.  No shortness of breath.  Extensive review of questions previously answered.   ROS: Has lost about 6 pounds.  Trying to eat well low sugar cardiac rehab diet.  Studies Reviewed: Marland Kitchen        Left heart catheterization/27/24-PTCA distal RCA late stent thrombosis of bare-metal stent 1999.  Event monitor 07/16/2021-third-degree heart block  Echo 12/2020-EF 55 to 60%   Risk Assessment/Calculations:            Physical Exam:   VS:  BP 128/78   Pulse 67   Ht 6\' 3"  (1.905 m)   Wt 174 lb (78.9 kg)   SpO2 99%   BMI 21.75 kg/m    Wt Readings from Last 3 Encounters:  01/23/23 174 lb (78.9 kg)  01/16/23 175 lb 0.7 oz (79.4 kg)  11/03/22 181 lb 14.1 oz (82.5 kg)    GEN: Well nourished, well developed in no acute distress NECK: No JVD; No carotid bruits CARDIAC: Prior CABG scar RRR, no murmurs, rubs, gallops RESPIRATORY:  Clear to auscultation without rales, wheezing or rhonchi  ABDOMEN: Soft, non-tender,  non-distended EXTREMITIES:  No edema; No deformity   ASSESSMENT AND PLAN: .    Coronary artery disease status post CABG with recent late stent thrombosis of bare-metal stent RCA distal 1999 placed with treatment PTCA. - Brilinta dual antiplatelet therapy for at least 12 months.  Started 10/22/2022.  Plavix thereafter.  Hyperlipidemia with goal less than 55 LDL, Now 54 - Has been agreeable to PCSK9 inhibitor in the future.  Hypertension - Blood pressure well-controlled.  Fatigue on metoprolol. Will stop .  It has been greater than 3 months since MI.  Greatest benefit is seen immediately peri-MI.  Interesting to see how he feels after stopping the low-dose metoprolol.  His wife Shane Ford will keep an eye on him.  Complete heart block status post pacemaker - This was placed in 2022 following CABG.  Had symptomatic PVCs and then heart block was seen on monitor.  Normal device function by EP.      Dispo: 6 months Eligha Bridegroom, NP  Signed, Donato Schultz, MD

## 2023-01-23 NOTE — Patient Instructions (Signed)
Medication Instructions:  Please discontinue your Metoprolol. Continue all other medications as listed.  *If you need a refill on your cardiac medications before your next appointment, please call your pharmacy*  Follow-Up: At [facility], you and your health needs are our priority.  As part of our continuing mission to provide you with exceptional heart care, we have created designated Provider Care Teams.  These Care Teams include your primary Cardiologist (physician) and Advanced Practice Providers (APPs -  Physician Assistants and Nurse Practitioners) who all work together to provide you with the care you need, when you need it.  We recommend signing up for the patient portal called "MyChart".  Sign up information is provided on this After Visit Summary.  MyChart is used to connect with patients for Virtual Visits (Telemedicine).  Patients are able to view lab/test results, encounter notes, upcoming appointments, etc.  Non-urgent messages can be sent to your provider as well.   To learn more about what you can do with MyChart, go to [email or web address].    Your next appointment:   6 month(s)  Provider:   Eligha Bridegroom, NP     Then, Donato Schultz, MD will plan to see you again in 1 year(s).

## 2023-01-24 ENCOUNTER — Ambulatory Visit: Payer: Medicare HMO | Admitting: Nurse Practitioner

## 2023-01-26 ENCOUNTER — Ambulatory Visit: Payer: Medicare HMO

## 2023-01-26 DIAGNOSIS — I442 Atrioventricular block, complete: Secondary | ICD-10-CM

## 2023-01-31 DIAGNOSIS — Z79899 Other long term (current) drug therapy: Secondary | ICD-10-CM | POA: Diagnosis not present

## 2023-01-31 DIAGNOSIS — L4050 Arthropathic psoriasis, unspecified: Secondary | ICD-10-CM | POA: Diagnosis not present

## 2023-02-07 NOTE — Progress Notes (Signed)
Remote pacemaker transmission.   

## 2023-02-11 IMAGING — CR DG CHEST 2V
2 series · 2 of 2 positions shown · non-contrast
Comparison: None.

CLINICAL DATA: Chest pain for 2 weeks.

EXAM:
CHEST - 2 VIEW

[chest pa]
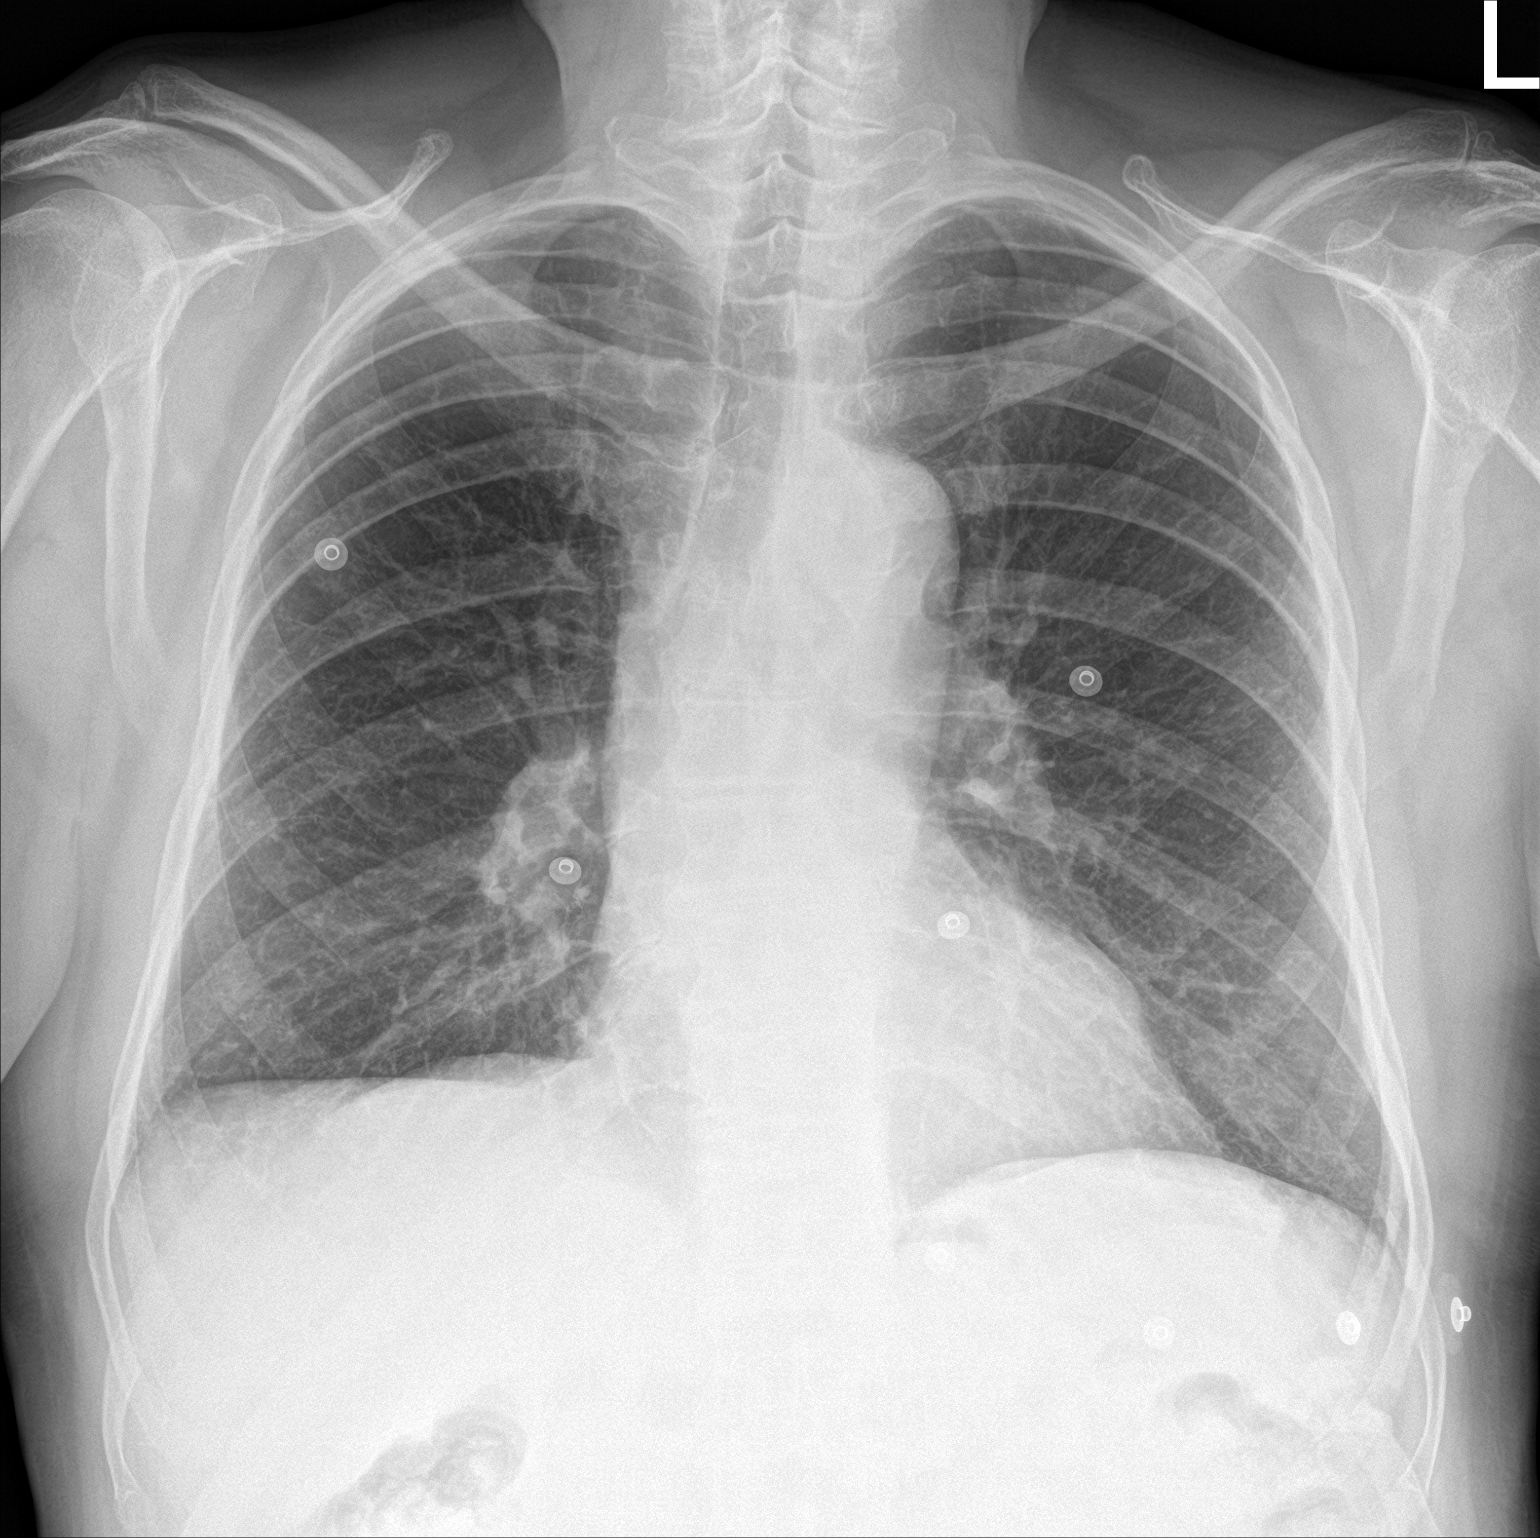

[chest lat]
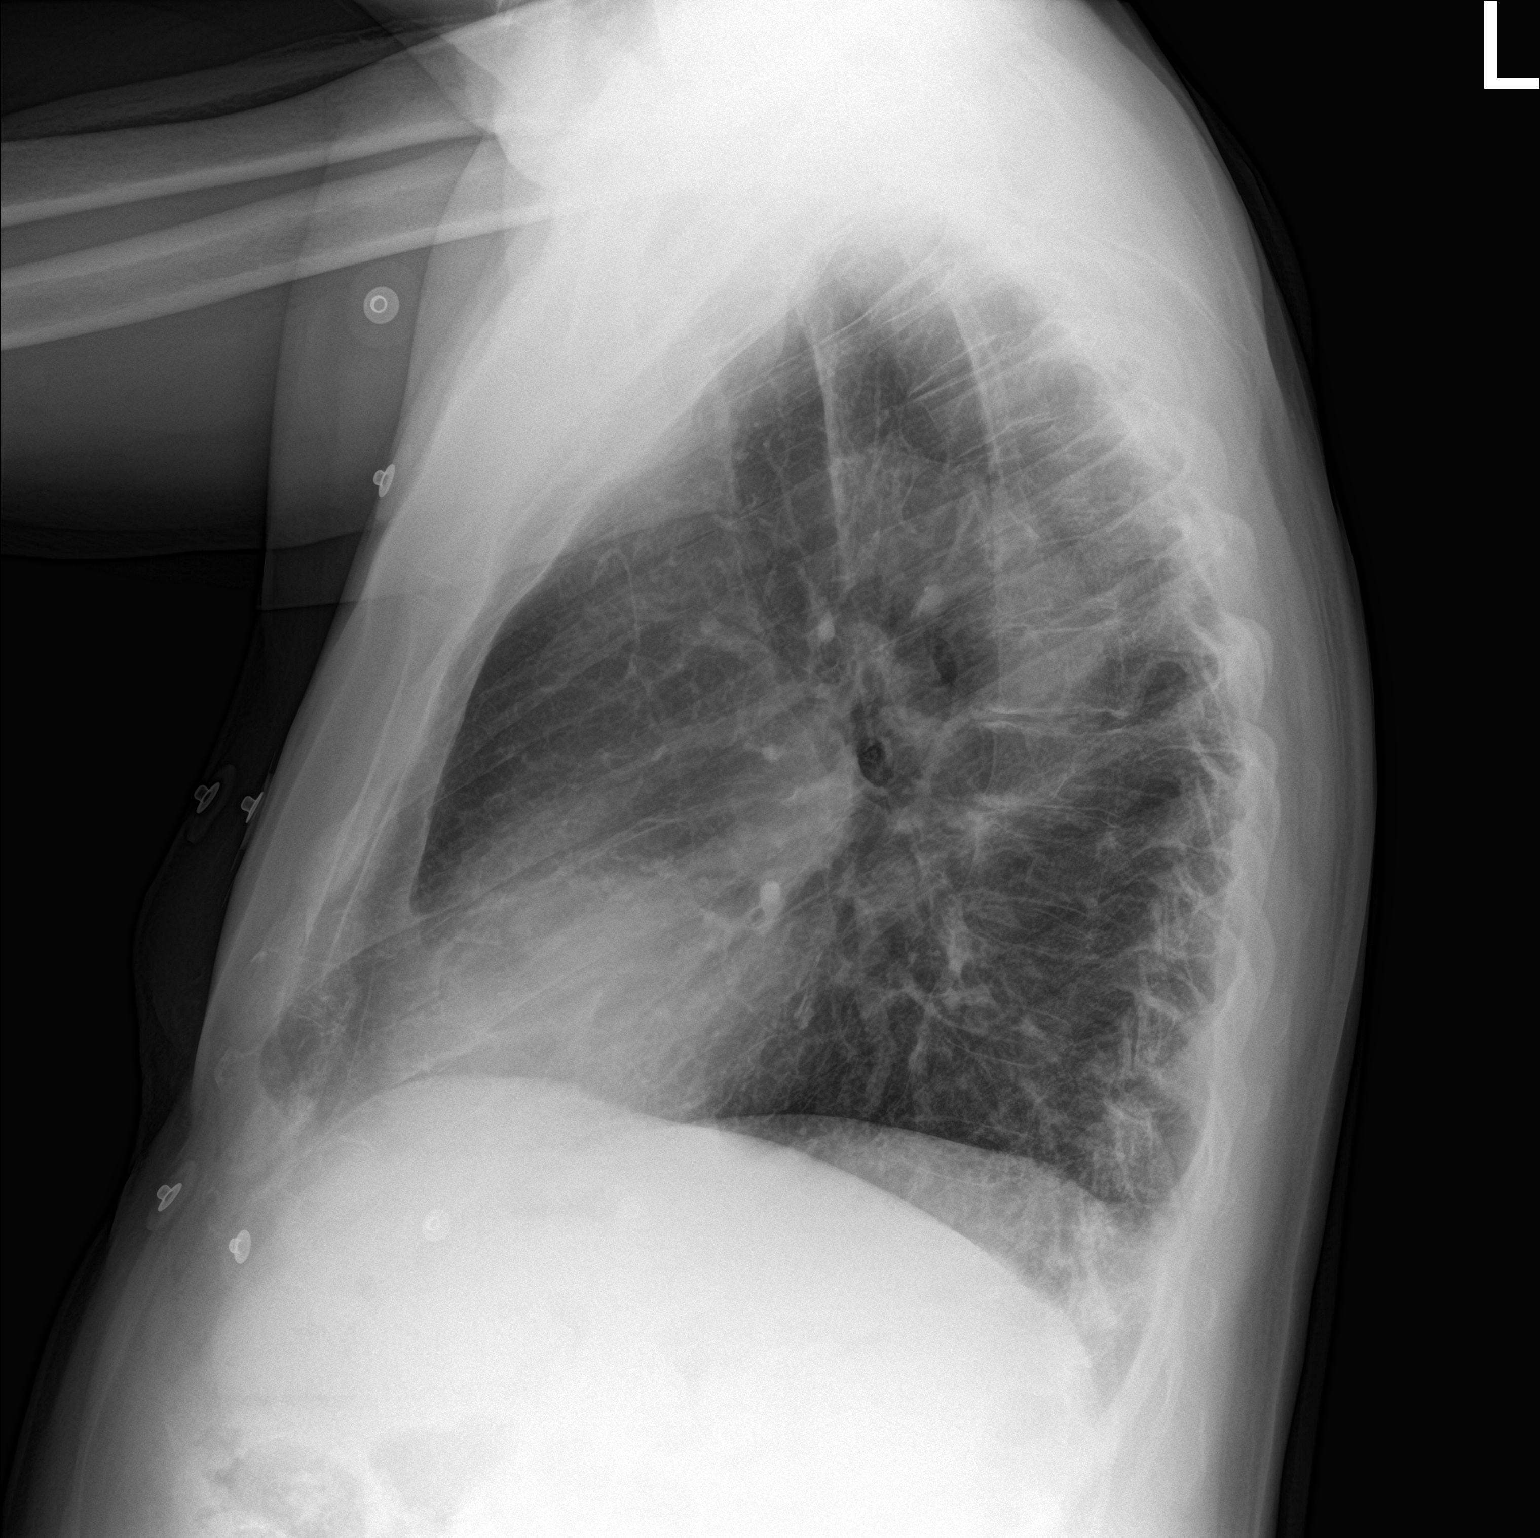

[2 of 2 positions shown; findings below may reference images not displayed]

FINDINGS: Normal heart size. No pleural effusion or edema. No airspace
opacities identified. Visualized osseous structures are
unremarkable.
IMPRESSION: No acute cardiopulmonary abnormalities.

## 2023-02-22 DIAGNOSIS — L570 Actinic keratosis: Secondary | ICD-10-CM | POA: Diagnosis not present

## 2023-02-22 DIAGNOSIS — X32XXXA Exposure to sunlight, initial encounter: Secondary | ICD-10-CM | POA: Diagnosis not present

## 2023-02-22 DIAGNOSIS — D225 Melanocytic nevi of trunk: Secondary | ICD-10-CM | POA: Diagnosis not present

## 2023-02-22 DIAGNOSIS — D2261 Melanocytic nevi of right upper limb, including shoulder: Secondary | ICD-10-CM | POA: Diagnosis not present

## 2023-02-22 DIAGNOSIS — L821 Other seborrheic keratosis: Secondary | ICD-10-CM | POA: Diagnosis not present

## 2023-02-22 DIAGNOSIS — D2272 Melanocytic nevi of left lower limb, including hip: Secondary | ICD-10-CM | POA: Diagnosis not present

## 2023-02-22 DIAGNOSIS — Z85828 Personal history of other malignant neoplasm of skin: Secondary | ICD-10-CM | POA: Diagnosis not present

## 2023-02-22 DIAGNOSIS — D2271 Melanocytic nevi of right lower limb, including hip: Secondary | ICD-10-CM | POA: Diagnosis not present

## 2023-04-27 ENCOUNTER — Ambulatory Visit (INDEPENDENT_AMBULATORY_CARE_PROVIDER_SITE_OTHER): Payer: Medicare HMO

## 2023-04-27 DIAGNOSIS — I442 Atrioventricular block, complete: Secondary | ICD-10-CM | POA: Diagnosis not present

## 2023-04-27 LAB — CUP PACEART REMOTE DEVICE CHECK
Battery Remaining Longevity: 131 mo
Battery Voltage: 3.02 V
Brady Statistic AP VP Percent: 25.95 %
Brady Statistic AP VS Percent: 0.02 %
Brady Statistic AS VP Percent: 73.7 %
Brady Statistic AS VS Percent: 0.33 %
Brady Statistic RA Percent Paced: 25.96 %
Brady Statistic RV Percent Paced: 99.65 %
Date Time Interrogation Session: 20241030220414
Implantable Lead Connection Status: 753985
Implantable Lead Connection Status: 753985
Implantable Lead Implant Date: 20230201
Implantable Lead Implant Date: 20230201
Implantable Lead Location: 753859
Implantable Lead Location: 753860
Implantable Lead Model: 3830
Implantable Lead Model: 5076
Implantable Pulse Generator Implant Date: 20230201
Lead Channel Impedance Value: 266 Ohm
Lead Channel Impedance Value: 342 Ohm
Lead Channel Impedance Value: 342 Ohm
Lead Channel Impedance Value: 494 Ohm
Lead Channel Pacing Threshold Amplitude: 0.625 V
Lead Channel Pacing Threshold Amplitude: 1 V
Lead Channel Pacing Threshold Pulse Width: 0.4 ms
Lead Channel Pacing Threshold Pulse Width: 0.4 ms
Lead Channel Sensing Intrinsic Amplitude: 16.75 mV
Lead Channel Sensing Intrinsic Amplitude: 16.75 mV
Lead Channel Sensing Intrinsic Amplitude: 4.5 mV
Lead Channel Sensing Intrinsic Amplitude: 4.5 mV
Lead Channel Setting Pacing Amplitude: 1.5 V
Lead Channel Setting Pacing Amplitude: 2 V
Lead Channel Setting Pacing Pulse Width: 0.4 ms
Lead Channel Setting Sensing Sensitivity: 0.9 mV
Zone Setting Status: 755011
Zone Setting Status: 755011

## 2023-05-15 NOTE — Progress Notes (Signed)
Remote pacemaker transmission.   

## 2023-05-23 DIAGNOSIS — E1151 Type 2 diabetes mellitus with diabetic peripheral angiopathy without gangrene: Secondary | ICD-10-CM | POA: Diagnosis not present

## 2023-05-23 DIAGNOSIS — E538 Deficiency of other specified B group vitamins: Secondary | ICD-10-CM | POA: Diagnosis not present

## 2023-05-31 DIAGNOSIS — Z125 Encounter for screening for malignant neoplasm of prostate: Secondary | ICD-10-CM | POA: Diagnosis not present

## 2023-05-31 DIAGNOSIS — E119 Type 2 diabetes mellitus without complications: Secondary | ICD-10-CM | POA: Diagnosis not present

## 2023-05-31 DIAGNOSIS — F32A Depression, unspecified: Secondary | ICD-10-CM | POA: Diagnosis not present

## 2023-06-05 DIAGNOSIS — Z79899 Other long term (current) drug therapy: Secondary | ICD-10-CM | POA: Diagnosis not present

## 2023-06-05 DIAGNOSIS — L4050 Arthropathic psoriasis, unspecified: Secondary | ICD-10-CM | POA: Diagnosis not present

## 2023-06-05 DIAGNOSIS — E113393 Type 2 diabetes mellitus with moderate nonproliferative diabetic retinopathy without macular edema, bilateral: Secondary | ICD-10-CM | POA: Diagnosis not present

## 2023-06-05 DIAGNOSIS — H524 Presbyopia: Secondary | ICD-10-CM | POA: Diagnosis not present

## 2023-07-27 ENCOUNTER — Ambulatory Visit: Payer: Medicare HMO

## 2023-07-27 DIAGNOSIS — I442 Atrioventricular block, complete: Secondary | ICD-10-CM

## 2023-07-27 LAB — CUP PACEART REMOTE DEVICE CHECK
Battery Remaining Longevity: 128 mo
Battery Voltage: 3.02 V
Brady Statistic AP VP Percent: 26.44 %
Brady Statistic AP VS Percent: 0.03 %
Brady Statistic AS VP Percent: 73.2 %
Brady Statistic AS VS Percent: 0.33 %
Brady Statistic RA Percent Paced: 26.5 %
Brady Statistic RV Percent Paced: 99.64 %
Date Time Interrogation Session: 20250130031224
Implantable Lead Connection Status: 753985
Implantable Lead Connection Status: 753985
Implantable Lead Implant Date: 20230201
Implantable Lead Implant Date: 20230201
Implantable Lead Location: 753859
Implantable Lead Location: 753860
Implantable Lead Model: 3830
Implantable Lead Model: 5076
Implantable Pulse Generator Implant Date: 20230201
Lead Channel Impedance Value: 266 Ohm
Lead Channel Impedance Value: 342 Ohm
Lead Channel Impedance Value: 361 Ohm
Lead Channel Impedance Value: 494 Ohm
Lead Channel Pacing Threshold Amplitude: 0.625 V
Lead Channel Pacing Threshold Amplitude: 1 V
Lead Channel Pacing Threshold Pulse Width: 0.4 ms
Lead Channel Pacing Threshold Pulse Width: 0.4 ms
Lead Channel Sensing Intrinsic Amplitude: 19.25 mV
Lead Channel Sensing Intrinsic Amplitude: 19.25 mV
Lead Channel Sensing Intrinsic Amplitude: 3.75 mV
Lead Channel Sensing Intrinsic Amplitude: 3.75 mV
Lead Channel Setting Pacing Amplitude: 1.5 V
Lead Channel Setting Pacing Amplitude: 2 V
Lead Channel Setting Pacing Pulse Width: 0.4 ms
Lead Channel Setting Sensing Sensitivity: 0.9 mV
Zone Setting Status: 755011
Zone Setting Status: 755011

## 2023-07-31 ENCOUNTER — Encounter: Payer: Self-pay | Admitting: Internal Medicine

## 2023-09-01 NOTE — Progress Notes (Signed)
 Remote pacemaker transmission.

## 2023-10-04 DIAGNOSIS — C44329 Squamous cell carcinoma of skin of other parts of face: Secondary | ICD-10-CM | POA: Diagnosis not present

## 2023-10-04 DIAGNOSIS — D0461 Carcinoma in situ of skin of right upper limb, including shoulder: Secondary | ICD-10-CM | POA: Diagnosis not present

## 2023-10-04 DIAGNOSIS — D2272 Melanocytic nevi of left lower limb, including hip: Secondary | ICD-10-CM | POA: Diagnosis not present

## 2023-10-04 DIAGNOSIS — D2261 Melanocytic nevi of right upper limb, including shoulder: Secondary | ICD-10-CM | POA: Diagnosis not present

## 2023-10-04 DIAGNOSIS — D225 Melanocytic nevi of trunk: Secondary | ICD-10-CM | POA: Diagnosis not present

## 2023-10-04 DIAGNOSIS — L570 Actinic keratosis: Secondary | ICD-10-CM | POA: Diagnosis not present

## 2023-10-04 DIAGNOSIS — L538 Other specified erythematous conditions: Secondary | ICD-10-CM | POA: Diagnosis not present

## 2023-10-04 DIAGNOSIS — D2262 Melanocytic nevi of left upper limb, including shoulder: Secondary | ICD-10-CM | POA: Diagnosis not present

## 2023-10-04 DIAGNOSIS — L820 Inflamed seborrheic keratosis: Secondary | ICD-10-CM | POA: Diagnosis not present

## 2023-10-04 DIAGNOSIS — D485 Neoplasm of uncertain behavior of skin: Secondary | ICD-10-CM | POA: Diagnosis not present

## 2023-10-04 DIAGNOSIS — Z85828 Personal history of other malignant neoplasm of skin: Secondary | ICD-10-CM | POA: Diagnosis not present

## 2023-10-05 DIAGNOSIS — Z79899 Other long term (current) drug therapy: Secondary | ICD-10-CM | POA: Diagnosis not present

## 2023-10-05 DIAGNOSIS — L4050 Arthropathic psoriasis, unspecified: Secondary | ICD-10-CM | POA: Diagnosis not present

## 2023-10-26 ENCOUNTER — Ambulatory Visit: Payer: Medicare HMO | Attending: Internal Medicine

## 2023-10-26 DIAGNOSIS — I442 Atrioventricular block, complete: Secondary | ICD-10-CM | POA: Diagnosis not present

## 2023-10-26 LAB — CUP PACEART REMOTE DEVICE CHECK
Battery Remaining Longevity: 124 mo
Battery Voltage: 3.01 V
Brady Statistic AP VP Percent: 35.86 %
Brady Statistic AP VS Percent: 0.03 %
Brady Statistic AS VP Percent: 63.8 %
Brady Statistic AS VS Percent: 0.32 %
Brady Statistic RA Percent Paced: 35.9 %
Brady Statistic RV Percent Paced: 99.66 %
Date Time Interrogation Session: 20250501033758
Implantable Lead Connection Status: 753985
Implantable Lead Connection Status: 753985
Implantable Lead Implant Date: 20230201
Implantable Lead Implant Date: 20230201
Implantable Lead Location: 753859
Implantable Lead Location: 753860
Implantable Lead Model: 3830
Implantable Lead Model: 5076
Implantable Pulse Generator Implant Date: 20230201
Lead Channel Impedance Value: 266 Ohm
Lead Channel Impedance Value: 323 Ohm
Lead Channel Impedance Value: 361 Ohm
Lead Channel Impedance Value: 475 Ohm
Lead Channel Pacing Threshold Amplitude: 0.625 V
Lead Channel Pacing Threshold Amplitude: 1 V
Lead Channel Pacing Threshold Pulse Width: 0.4 ms
Lead Channel Pacing Threshold Pulse Width: 0.4 ms
Lead Channel Sensing Intrinsic Amplitude: 18.625 mV
Lead Channel Sensing Intrinsic Amplitude: 18.625 mV
Lead Channel Sensing Intrinsic Amplitude: 4.75 mV
Lead Channel Sensing Intrinsic Amplitude: 4.75 mV
Lead Channel Setting Pacing Amplitude: 1.5 V
Lead Channel Setting Pacing Amplitude: 2 V
Lead Channel Setting Pacing Pulse Width: 0.4 ms
Lead Channel Setting Sensing Sensitivity: 0.9 mV
Zone Setting Status: 755011
Zone Setting Status: 755011

## 2023-10-29 ENCOUNTER — Encounter: Payer: Self-pay | Admitting: Internal Medicine

## 2023-11-15 DIAGNOSIS — C44329 Squamous cell carcinoma of skin of other parts of face: Secondary | ICD-10-CM | POA: Diagnosis not present

## 2023-11-22 DIAGNOSIS — D0461 Carcinoma in situ of skin of right upper limb, including shoulder: Secondary | ICD-10-CM | POA: Diagnosis not present

## 2023-11-27 DIAGNOSIS — E1151 Type 2 diabetes mellitus with diabetic peripheral angiopathy without gangrene: Secondary | ICD-10-CM | POA: Diagnosis not present

## 2023-11-27 DIAGNOSIS — E538 Deficiency of other specified B group vitamins: Secondary | ICD-10-CM | POA: Diagnosis not present

## 2023-11-27 DIAGNOSIS — Z125 Encounter for screening for malignant neoplasm of prostate: Secondary | ICD-10-CM | POA: Diagnosis not present

## 2023-12-04 DIAGNOSIS — Z0000 Encounter for general adult medical examination without abnormal findings: Secondary | ICD-10-CM | POA: Diagnosis not present

## 2023-12-04 DIAGNOSIS — F339 Major depressive disorder, recurrent, unspecified: Secondary | ICD-10-CM | POA: Diagnosis not present

## 2023-12-04 DIAGNOSIS — L4050 Arthropathic psoriasis, unspecified: Secondary | ICD-10-CM | POA: Diagnosis not present

## 2023-12-04 DIAGNOSIS — E1151 Type 2 diabetes mellitus with diabetic peripheral angiopathy without gangrene: Secondary | ICD-10-CM | POA: Diagnosis not present

## 2023-12-04 DIAGNOSIS — E782 Mixed hyperlipidemia: Secondary | ICD-10-CM | POA: Diagnosis not present

## 2023-12-04 DIAGNOSIS — E538 Deficiency of other specified B group vitamins: Secondary | ICD-10-CM | POA: Diagnosis not present

## 2023-12-04 DIAGNOSIS — R972 Elevated prostate specific antigen [PSA]: Secondary | ICD-10-CM | POA: Diagnosis not present

## 2023-12-04 DIAGNOSIS — F0150 Vascular dementia without behavioral disturbance: Secondary | ICD-10-CM | POA: Diagnosis not present

## 2023-12-07 NOTE — Progress Notes (Signed)
 Remote pacemaker transmission.

## 2024-01-02 DIAGNOSIS — E113393 Type 2 diabetes mellitus with moderate nonproliferative diabetic retinopathy without macular edema, bilateral: Secondary | ICD-10-CM | POA: Diagnosis not present

## 2024-01-25 ENCOUNTER — Ambulatory Visit (INDEPENDENT_AMBULATORY_CARE_PROVIDER_SITE_OTHER): Payer: Medicare HMO

## 2024-01-25 DIAGNOSIS — I442 Atrioventricular block, complete: Secondary | ICD-10-CM

## 2024-01-25 LAB — CUP PACEART REMOTE DEVICE CHECK
Battery Remaining Longevity: 122 mo
Battery Voltage: 3.01 V
Brady Statistic AP VP Percent: 44.41 %
Brady Statistic AP VS Percent: 0.02 %
Brady Statistic AS VP Percent: 55.28 %
Brady Statistic AS VS Percent: 0.3 %
Brady Statistic RA Percent Paced: 44.39 %
Brady Statistic RV Percent Paced: 99.69 %
Date Time Interrogation Session: 20250731002728
Implantable Lead Connection Status: 753985
Implantable Lead Connection Status: 753985
Implantable Lead Implant Date: 20230201
Implantable Lead Implant Date: 20230201
Implantable Lead Location: 753859
Implantable Lead Location: 753860
Implantable Lead Model: 3830
Implantable Lead Model: 5076
Implantable Pulse Generator Implant Date: 20230201
Lead Channel Impedance Value: 266 Ohm
Lead Channel Impedance Value: 342 Ohm
Lead Channel Impedance Value: 361 Ohm
Lead Channel Impedance Value: 494 Ohm
Lead Channel Pacing Threshold Amplitude: 0.5 V
Lead Channel Pacing Threshold Amplitude: 1 V
Lead Channel Pacing Threshold Pulse Width: 0.4 ms
Lead Channel Pacing Threshold Pulse Width: 0.4 ms
Lead Channel Sensing Intrinsic Amplitude: 20.125 mV
Lead Channel Sensing Intrinsic Amplitude: 20.125 mV
Lead Channel Sensing Intrinsic Amplitude: 3.75 mV
Lead Channel Sensing Intrinsic Amplitude: 3.75 mV
Lead Channel Setting Pacing Amplitude: 1.5 V
Lead Channel Setting Pacing Amplitude: 2 V
Lead Channel Setting Pacing Pulse Width: 0.4 ms
Lead Channel Setting Sensing Sensitivity: 0.9 mV
Zone Setting Status: 755011
Zone Setting Status: 755011

## 2024-01-26 ENCOUNTER — Ambulatory Visit: Payer: Self-pay | Admitting: Internal Medicine

## 2024-02-01 ENCOUNTER — Encounter: Payer: Self-pay | Admitting: Internal Medicine

## 2024-02-01 ENCOUNTER — Ambulatory Visit: Attending: Internal Medicine | Admitting: Internal Medicine

## 2024-02-01 VITALS — BP 122/66 | Ht 74.0 in | Wt 179.4 lb

## 2024-02-01 DIAGNOSIS — I442 Atrioventricular block, complete: Secondary | ICD-10-CM

## 2024-02-01 DIAGNOSIS — I493 Ventricular premature depolarization: Secondary | ICD-10-CM

## 2024-02-01 DIAGNOSIS — Z950 Presence of cardiac pacemaker: Secondary | ICD-10-CM | POA: Diagnosis not present

## 2024-02-01 NOTE — Progress Notes (Signed)
 HPI Mr. Sheckler returns for ongoing evaluation of symptomatic PVCs and high-grade heart block s/p PPM insertion.  The patient has not had syncope.  He does have dizzy spells and palpitations.  He has a history of coronary artery disease with preserved left ventricular function status post bypass surgery.  He denies anginal symptoms. He has done well since his PPM insertion. No chest pain or sob. He feels like he has more energy. He does admit to being more sedentary this summer as he was told not to exercise in the heat.  No Known Allergies   Current Outpatient Medications  Medication Sig Dispense Refill   acetaminophen  (TYLENOL ) 500 MG tablet Take 1,000 mg by mouth every 6 (six) hours as needed for moderate pain.     aspirin  EC 81 MG tablet Take 81 mg by mouth in the morning. Swallow whole.     atorvastatin  (LIPITOR ) 80 MG tablet Take 1 tablet (80 mg total) by mouth daily. 30 tablet 3   Cholecalciferol  50 MCG (2000 UT) CAPS Take 2,000 Units by mouth in the morning.     docusate sodium  (COLACE) 100 MG capsule Take 100 mg by mouth daily as needed for mild constipation.     donepezil  (ARICEPT ) 10 MG tablet Take 10 mg by mouth at bedtime.     empagliflozin  (JARDIANCE ) 10 MG TABS tablet Take 1 tablet (10 mg total) by mouth daily before breakfast. 30 tablet 3   fenofibrate  160 MG tablet Take 160 mg by mouth in the morning.     glimepiride  (AMARYL ) 4 MG tablet Take 4 mg by mouth daily with breakfast.     metFORMIN (GLUCOPHAGE) 500 MG tablet Take 500 mg by mouth 2 (two) times daily with a meal.     methotrexate  (RHEUMATREX) 2.5 MG tablet Take 12.5 mg by mouth every Wednesday. Take 6 tablets (12.5mg ) once a week on Wednesdays. Caution:Chemotherapy. Protect from light.     mirtazapine (REMERON) 15 MG tablet Take 15 mg by mouth at bedtime.     nitroGLYCERIN  (NITROSTAT ) 0.4 MG SL tablet Place 1 tablet (0.4 mg total) under the tongue every 5 (five) minutes as needed for chest pain. 25 tablet 3    Polyethyl Glycol-Propyl Glycol (SYSTANE) 0.4-0.3 % SOLN Apply 1 drop to eye 2 (two) times daily as needed.     vitamin B-12 (CYANOCOBALAMIN ) 1000 MCG tablet Take 1,000 mcg by mouth daily.     No current facility-administered medications for this visit.     Past Medical History:  Diagnosis Date   Arthritis    pt states he was diagnosed 10+ years ago; psoriatic arthritis   Coronary artery disease    Depression    pt diagnosed 10+ years ago   Diabetes mellitus (HCC)    Dysrhythmia 01/17/2021   first degree AV block, 2nd degree AVB, Mobitz I in setting of MI   Hyperlipidemia    Hypertension     ROS:   All systems reviewed and negative except as noted in the HPI.   Past Surgical History:  Procedure Laterality Date   ANTERIOR CERVICAL DECOMP/DISCECTOMY FUSION N/A 05/12/2021   Procedure: ANTERIOR CERVICAL DECOMPRESSION FUSION CERVICAL 5- CERVICAL 6 WITH INSTRUMENTATION AND ALLOGRAFT;  Surgeon: Beuford Anes, MD;  Location: [facility] OR;  Service: Orthopedics;  Laterality: N/A;   CARDIAC CATHETERIZATION     CORONARY ARTERY BYPASS GRAFT N/A 01/19/2021   Procedure: CORONARY ARTERY BYPASS GRAFTING (CABG) TIMES FOUR, ON PUMP, USING LEFT INTERNAL MAMMARY ARTERY AND ENDOSCOPICALLY HARVESTED  GREATER SAPHENOUS VEIN;  Surgeon: Shyrl Linnie KIDD, MD;  Location: [facility] OR;  Service: Open Heart Surgery;  Laterality: N/A;  flow trac   CORONARY/GRAFT ACUTE MI REVASCULARIZATION N/A 10/22/2022   Procedure: Coronary/Graft Acute MI Revascularization;  Surgeon: Dann Candyce RAMAN, MD;  Location: [facility] INVASIVE CV LAB;  Service: Cardiovascular;  Laterality: N/A;   ENDOVEIN HARVEST OF GREATER SAPHENOUS VEIN Right 01/19/2021   Procedure: ENDOVEIN HARVEST OF GREATER SAPHENOUS VEIN;  Surgeon: Shyrl Linnie KIDD, MD;  Location: [facility] OR;  Service: Open Heart Surgery;  Laterality: Right;   EYE SURGERY Bilateral 2021   cataracts replaced in both eyes   LEFT HEART CATH AND CORONARY ANGIOGRAPHY N/A 01/17/2021   Procedure:  LEFT HEART CATH AND CORONARY ANGIOGRAPHY;  Surgeon: Claudene Victory ORN, MD;  Location: [facility] INVASIVE CV LAB;  Service: Cardiovascular;  Laterality: N/A;   LEFT HEART CATH AND CORS/GRAFTS ANGIOGRAPHY N/A 10/22/2022   Procedure: LEFT HEART CATH AND CORS/GRAFTS ANGIOGRAPHY;  Surgeon: Dann Candyce RAMAN, MD;  Location: [facility] INVASIVE CV LAB;  Service: Cardiovascular;  Laterality: N/A;   PACEMAKER IMPLANT N/A 07/28/2021   Procedure: PACEMAKER IMPLANT;  Surgeon: Waddell Danelle ORN, MD;  Location: [facility] INVASIVE CV LAB;  Service: Cardiovascular;  Laterality: N/A;   TEE WITHOUT CARDIOVERSION N/A 01/19/2021   Procedure: TRANSESOPHAGEAL ECHOCARDIOGRAM (TEE);  Surgeon: Shyrl Linnie KIDD, MD;  Location: [facility] OR;  Service: Open Heart Surgery;  Laterality: N/A;     History reviewed. No pertinent family history.   Social History   Socioeconomic History   Marital status: Married    Spouse name: Not on file   Number of children: 2   Years of education: 14   Highest education level: Not on file  Occupational History   Occupation: Retired  Tobacco Use   Smoking status: Never   Smokeless tobacco: Never  Vaping Use   Vaping status: Never Used  Substance and Sexual Activity   Alcohol  use: Never   Drug use: Never   Sexual activity: Not on file  Other Topics Concern   Not on file  Social History Narrative   Not on file   Social Drivers of Health   Financial Resource Strain: Low Risk  (12/04/2023)   Received from [facility]   Overall Financial Resource Strain (CARDIA)    Difficulty of Paying Living Expenses: Not hard at all  Food Insecurity: No Food Insecurity (12/04/2023)   Received from [facility]   Hunger Vital Sign    Within the past 12 months, you worried that your food would run out before you got the money to buy more.: Never true    Within the past 12 months, the food you bought just didn't last and you didn't have money to get more.: Never true  Transportation  Needs: No Transportation Needs (12/04/2023)   Received from [facility] - Transportation    In the past 12 months, has lack of transportation kept you from medical appointments or from getting medications?: No    Lack of Transportation (Non-Medical): No  Physical Activity: Not on file  Stress: Not on file  Social Connections: Not on file  Intimate Partner Violence: Not on file     BP 122/66   Ht 6' 2 (1.88 m)   Wt 179 lb 6.4 oz (81.4 kg)   SpO2 97%   BMI 23.03 kg/m   Physical Exam:  Well appearing NAD HEENT: Unremarkable Neck:  No JVD, no thyromegally Lymphatics:  No  adenopathy Back:  No CVA tenderness Lungs:  Clear with no wheezes HEART:  Regular rate rhythm, no murmurs, no rubs, no clicks Abd:  soft, positive bowel sounds, no organomegally, no rebound, no guarding Ext:  2 plus pulses, no edema, no cyanosis, no clubbing Skin:  No rashes no nodules Neuro:  CN II through XII intact, motor grossly intact  EKG - NSR with P synch, ventricular pacing  DEVICE  Normal device function.  See PaceArt for details.   Assess/Plan: 2:1 AV block - he is asymptomatic s/p PPM insertion.  PM - his medtronic DDD PM with a left bundle lead is working normally. CAD - he denies anginal symptoms s/p CABG.  Danelle Espiridion Supinski,MD

## 2024-02-01 NOTE — Patient Instructions (Signed)
 Medication Instructions:  Your physician recommends that you continue on your current medications as directed. Please refer to the Current Medication list given to you today.  *If you need a refill on your cardiac medications before your next appointment, please call your pharmacy*  Lab Work: None ordered   Testing/Procedures: None ordered  Follow-Up: At [name], you and your health needs are our priority.  As part of our continuing mission to provide you with exceptional heart care, our providers are all part of one team.  This team includes your primary Cardiologist (physician) and Advanced Practice Providers or APPs (Physician Assistants and Nurse Practitioners) who all work together to provide you with the care you need, when you need it.  Your next appointment:   1 year(s)  Provider:   Dr. Almetta    Thank you for choosing [facility]!!

## 2024-02-02 DIAGNOSIS — I10 Essential (primary) hypertension: Secondary | ICD-10-CM | POA: Diagnosis not present

## 2024-02-02 DIAGNOSIS — E119 Type 2 diabetes mellitus without complications: Secondary | ICD-10-CM | POA: Diagnosis not present

## 2024-02-02 DIAGNOSIS — H26492 Other secondary cataract, left eye: Secondary | ICD-10-CM | POA: Diagnosis not present

## 2024-02-02 DIAGNOSIS — Z961 Presence of intraocular lens: Secondary | ICD-10-CM | POA: Diagnosis not present

## 2024-02-06 DIAGNOSIS — Z79899 Other long term (current) drug therapy: Secondary | ICD-10-CM | POA: Diagnosis not present

## 2024-02-06 DIAGNOSIS — L4050 Arthropathic psoriasis, unspecified: Secondary | ICD-10-CM | POA: Diagnosis not present

## 2024-02-16 ENCOUNTER — Encounter: Payer: Self-pay | Admitting: Internal Medicine

## 2024-02-16 ENCOUNTER — Ambulatory Visit: Admitting: Internal Medicine

## 2024-02-16 VITALS — BP 118/68 | HR 68 | Temp 98.0°F | Ht 74.0 in | Wt 178.0 lb

## 2024-02-16 DIAGNOSIS — Z7984 Long term (current) use of oral hypoglycemic drugs: Secondary | ICD-10-CM

## 2024-02-16 DIAGNOSIS — E559 Vitamin D deficiency, unspecified: Secondary | ICD-10-CM | POA: Diagnosis not present

## 2024-02-16 DIAGNOSIS — R413 Other amnesia: Secondary | ICD-10-CM

## 2024-02-16 DIAGNOSIS — L4050 Arthropathic psoriasis, unspecified: Secondary | ICD-10-CM | POA: Diagnosis not present

## 2024-02-16 DIAGNOSIS — Z951 Presence of aortocoronary bypass graft: Secondary | ICD-10-CM | POA: Diagnosis not present

## 2024-02-16 DIAGNOSIS — E538 Deficiency of other specified B group vitamins: Secondary | ICD-10-CM | POA: Diagnosis not present

## 2024-02-16 DIAGNOSIS — K5900 Constipation, unspecified: Secondary | ICD-10-CM | POA: Diagnosis not present

## 2024-02-16 DIAGNOSIS — E782 Mixed hyperlipidemia: Secondary | ICD-10-CM | POA: Diagnosis not present

## 2024-02-16 DIAGNOSIS — E1151 Type 2 diabetes mellitus with diabetic peripheral angiopathy without gangrene: Secondary | ICD-10-CM

## 2024-02-16 DIAGNOSIS — F330 Major depressive disorder, recurrent, mild: Secondary | ICD-10-CM | POA: Diagnosis not present

## 2024-02-16 DIAGNOSIS — Z961 Presence of intraocular lens: Secondary | ICD-10-CM

## 2024-02-16 DIAGNOSIS — I25118 Atherosclerotic heart disease of native coronary artery with other forms of angina pectoris: Secondary | ICD-10-CM

## 2024-02-16 DIAGNOSIS — I2510 Atherosclerotic heart disease of native coronary artery without angina pectoris: Secondary | ICD-10-CM | POA: Insufficient documentation

## 2024-02-16 DIAGNOSIS — M5090 Cervical disc disorder, unspecified, unspecified cervical region: Secondary | ICD-10-CM

## 2024-02-16 DIAGNOSIS — N200 Calculus of kidney: Secondary | ICD-10-CM | POA: Insufficient documentation

## 2024-02-16 DIAGNOSIS — I10 Essential (primary) hypertension: Secondary | ICD-10-CM | POA: Insufficient documentation

## 2024-02-16 MED ORDER — EMPAGLIFLOZIN 10 MG PO TABS: 10.0000 mg | ORAL_TABLET | Freq: Every day | ORAL | 4 refills | Status: DC

## 2024-02-16 MED ORDER — GLIMEPIRIDE 4 MG PO TABS: 4.0000 mg | ORAL_TABLET | Freq: Every day | ORAL | 11 refills | Status: AC

## 2024-02-16 MED ORDER — NITROGLYCERIN 0.4 MG SL SUBL: 0.4000 mg | SUBLINGUAL_TABLET | SUBLINGUAL | 3 refills | Status: DC | PRN

## 2024-02-16 MED ORDER — FENOFIBRATE 160 MG PO TABS
160.0000 mg | ORAL_TABLET | Freq: Every morning | ORAL | 4 refills | Status: AC
  Filled 2024-06-16 – 2024-07-12 (×2): qty 90, 90d supply, fill #0

## 2024-02-16 MED ORDER — VITAMIN B-12 1000 MCG PO TABS: 1000.0000 ug | ORAL_TABLET | Freq: Every day | ORAL | 4 refills | Status: DC

## 2024-02-16 MED ORDER — DOCUSATE SODIUM 100 MG PO CAPS: 100.0000 mg | ORAL_CAPSULE | Freq: Every day | ORAL | 4 refills | Status: AC | PRN

## 2024-02-16 MED ORDER — SERTRALINE HCL 50 MG PO TABS: 50.0000 mg | ORAL_TABLET | Freq: Every day | ORAL | 4 refills | Status: DC

## 2024-02-16 MED ORDER — METFORMIN HCL 500 MG PO TABS
500.0000 mg | ORAL_TABLET | Freq: Two times a day (BID) | ORAL | 4 refills | Status: AC
  Filled 2024-06-16 – 2024-07-12 (×2): qty 180, 90d supply, fill #0

## 2024-02-16 MED ORDER — MIRTAZAPINE 15 MG PO TABS: 7.5000 mg | ORAL_TABLET | Freq: Every day | ORAL | 4 refills | Status: DC

## 2024-02-16 MED ORDER — DONEPEZIL HCL 10 MG PO TABS: 10.0000 mg | ORAL_TABLET | Freq: Every day | ORAL | 4 refills | Status: AC

## 2024-02-16 MED ORDER — CHOLECALCIFEROL 50 MCG (2000 UT) PO CAPS
2000.0000 [IU] | ORAL_CAPSULE | Freq: Every morning | ORAL | 4 refills | Status: AC
Start: 2024-02-16 — End: ?

## 2024-02-16 MED ORDER — SYSTANE 0.4-0.3 % OP SOLN: 1.0000 [drp] | Freq: Two times a day (BID) | OPHTHALMIC | 3 refills | Status: AC | PRN

## 2024-02-16 MED ORDER — TICAGRELOR 90 MG PO TABS: 90.0000 mg | ORAL_TABLET | Freq: Two times a day (BID) | ORAL | 4 refills | Status: DC

## 2024-02-16 NOTE — Progress Notes (Signed)
 [facility]  Phone: [phone]    - Medical Office Visit -  Visit Date: 02/16/2024 Patient: Shane Ford   DOB: 1945-08-25   78 y.o. Male  MRN: [id number] Patient Care Team: Jesus Bernardino MATSU, MD as PCP - General (Internal Medicine) Jeffrie Oneil BROCKS, MD as PCP - Cardiology (Cardiology) Dasher, Alm LABOR, MD (Dermatology) Today's Health Care Provider: Bernardino MATSU Jesus, MD  ===========================================    Chief Complaint / Reason for Visit: New pt  (Pt is present to est care with pcp to have new provider)   Background: 78 y.o. male who has Status post coronary artery bypass grafting; AV heart block; Pacemaker; PVC's (premature ventricular contractions); Psoriatic arthropathy (HCC); B12 deficiency; CAD (coronary artery disease), native coronary artery; Cervical disc disease; Combined hyperlipidemia; Kidney stones; Major depressive disorder, recurrent, mild (HCC); and Type 2 diabetes mellitus with peripheral angiopathy (HCC) on their problem list.  Discussed the use of AI scribe software for clinical note transcription with the patient, who gave verbal consent to proceed.  History of Present Illness 78 year old male with coronary artery disease and psoriatic arthritis who presents for transfer of care and medication management.  He has a history of coronary artery disease, including a heart attack in 2024, a quadruple bypass surgery in 2022, and a stent placement in 1999 that was later ballooned open. He has a pacemaker due to heart block and has experienced PVCs in the past. He is on atorvastatin  and takes aspirin  regularly.  He is managing psoriatic arthritis with methotrexate  and is currently under the care of Dr. Eduard, with plans to transition to a rheumatologist in [place]. He has an upcoming appointment with Dr. Eduard in December 2025.  His type 2 diabetes is managed with glimepiride  4 mg daily, metformin  500 mg twice daily, and Jardiance   once daily. His last A1c in June was 6.7%, and he reports stable blood sugar levels. He is up to date on diabetes-related exams.  He has a history of depression and anxiety, initially diagnosed in the early 2000s. He is currently on Zoloft , which was increased to 100 mg last week, and mirtazapine , which he is considering discontinuing. He reports a past trial of epinephrine  that was discontinued due to constipation.  He has mild memory impairment and takes donepezil . A past MRI showed some abnormalities, and he continues donepezil  to prevent progression of memory loss.  He has a history of B12 deficiency, managed with regular supplementation. He denies any issues with high blood pressure and has a history of cervical disc disease, for which he underwent fusion surgery in 2022. He also has a history of kidney stones, with no recent occurrences.  He has a history of skin cancer, with recent removal of squamous cell carcinoma. He plans to continue follow-up with his dermatologist, Dr. Dela, who is conveniently located near his residence.  Medications updated/reviewed: Current Outpatient Medications on File Prior to Visit  Medication Sig   acetaminophen  (TYLENOL ) 500 MG tablet Take 1,000 mg by mouth every 6 (six) hours as needed for moderate pain.   aspirin  EC 81 MG tablet Take 81 mg by mouth in the morning. Swallow whole.   atorvastatin  (LIPITOR ) 80 MG tablet Take 1 tablet (80 mg total) by mouth daily. (Patient taking differently: Take 80 mg by mouth daily. Pt is taking 40mg  now once daily)   methotrexate  (RHEUMATREX) 2.5 MG tablet Take 12.5 mg by mouth every Wednesday. Take 6 tablets (12.5mg ) once a week on Wednesdays. Caution:Chemotherapy.  Protect from light.   No current facility-administered medications on file prior to visit.   Medications Discontinued During This Encounter  Medication Reason   vitamin B-12 (CYANOCOBALAMIN ) 1000 MCG tablet Reorder   fenofibrate  160 MG tablet Reorder    metFORMIN  (GLUCOPHAGE ) 500 MG tablet Reorder   empagliflozin  (JARDIANCE ) 10 MG TABS tablet Reorder   Cholecalciferol  50 MCG (2000 UT) CAPS Reorder   docusate sodium  (COLACE) 100 MG capsule Reorder   donepezil  (ARICEPT ) 10 MG tablet Reorder   mirtazapine  (REMERON ) 15 MG tablet Reorder   Polyethyl Glycol-Propyl Glycol (SYSTANE) 0.4-0.3 % SOLN Reorder   nitroGLYCERIN  (NITROSTAT ) 0.4 MG SL tablet Reorder   glimepiride  (AMARYL ) 4 MG tablet Reorder   ticagrelor  (BRILINTA ) 90 MG TABS tablet Reorder   sertraline  (ZOLOFT ) 50 MG tablet Reorder   Current Meds  Medication Sig   acetaminophen  (TYLENOL ) 500 MG tablet Take 1,000 mg by mouth every 6 (six) hours as needed for moderate pain.   aspirin  EC 81 MG tablet Take 81 mg by mouth in the morning. Swallow whole.   atorvastatin  (LIPITOR ) 80 MG tablet Take 1 tablet (80 mg total) by mouth daily. (Patient taking differently: Take 80 mg by mouth daily. Pt is taking 40mg  now once daily)   methotrexate  (RHEUMATREX) 2.5 MG tablet Take 12.5 mg by mouth every Wednesday. Take 6 tablets (12.5mg ) once a week on Wednesdays. Caution:Chemotherapy. Protect from light.   [DISCONTINUED] Cholecalciferol  50 MCG (2000 UT) CAPS Take 2,000 Units by mouth in the morning.   [DISCONTINUED] donepezil  (ARICEPT ) 10 MG tablet Take 10 mg by mouth at bedtime.   [DISCONTINUED] empagliflozin  (JARDIANCE ) 10 MG TABS tablet Take 1 tablet (10 mg total) by mouth daily before breakfast.   [DISCONTINUED] fenofibrate  160 MG tablet Take 160 mg by mouth in the morning.   [DISCONTINUED] glimepiride  (AMARYL ) 4 MG tablet Take 4 mg by mouth daily with breakfast.   [DISCONTINUED] metFORMIN  (GLUCOPHAGE ) 500 MG tablet Take 500 mg by mouth 2 (two) times daily with a meal.   [DISCONTINUED] mirtazapine  (REMERON ) 15 MG tablet Take 15 mg by mouth at bedtime.   [DISCONTINUED] nitroGLYCERIN  (NITROSTAT ) 0.4 MG SL tablet Place 1 tablet (0.4 mg total) under the tongue every 5 (five) minutes as needed for chest  pain.   [DISCONTINUED] sertraline  (ZOLOFT ) 50 MG tablet Take 50 mg by mouth daily.   [DISCONTINUED] ticagrelor  (BRILINTA ) 90 MG TABS tablet Take 90 mg by mouth 2 (two) times daily.   [DISCONTINUED] vitamin B-12 (CYANOCOBALAMIN ) 1000 MCG tablet Take 1,000 mcg by mouth daily.    Allergies:  Patient has no known allergies. Past Medical History:  has a past medical history of Acute inferior myocardial infarction [facility]) (10/22/2022), Acute myocardial infarction [facility]), Arthritis, Chest pain at rest (01/17/2021), Coronary artery disease, Depression, Diabetes mellitus (HCC), Dysrhythmia (01/17/2021), Hyperlipidemia, Hypertension, and ST elevation myocardial infarction (STEMI) (HCC). Past Surgical History:   has a past surgical history that includes LEFT HEART CATH AND CORONARY ANGIOGRAPHY (N/A, 01/17/2021); Coronary artery bypass graft (N/A, 01/19/2021); TEE without cardioversion (N/A, 01/19/2021); Endoharvest vein of greater saphenous vein (Right, 01/19/2021); Cardiac catheterization; Eye surgery (Bilateral, 2021); Anterior cervical decomp/discectomy fusion (N/A, 05/12/2021); PACEMAKER IMPLANT (N/A, 07/28/2021); Coronary/Graft Acute MI Revascularization (N/A, 10/22/2022); and LEFT HEART CATH AND CORS/GRAFTS ANGIOGRAPHY (N/A, 10/22/2022). Social History:   reports that he has never smoked. He has never used smokeless tobacco. He reports that he does not drink alcohol  and does not use drugs. Family History:  family history is not on file. Depression Screen and Health Maintenance:  02/16/2024    8:55 AM 01/09/2023    5:15 PM 11/03/2022   12:24 PM 04/13/2021    2:34 PM  PHQ 2/9 Scores  PHQ - 2 Score 1 2 1  0  PHQ- 9 Score 1 3 3     Health Maintenance  Topic Date Due   FOOT EXAM  Never done   OPHTHALMOLOGY EXAM  Never done   Diabetic kidney evaluation - Urine ACR  Never done   Hepatitis C Screening  Never done   Zoster Vaccines- Shingrix (2 of 2) 12/19/2019   DTaP/Tdap/Td (1 - Tdap) 11/12/2021   HEMOGLOBIN  A1C  04/23/2023   Medicare Annual Wellness (AWV)  11/24/2023   Diabetic kidney evaluation - eGFR measurement  01/18/2024   INFLUENZA VACCINE  01/26/2024   COVID-19 Vaccine (5 - 2024-25 season) 03/03/2024 (Originally 02/26/2023)   Pneumococcal Vaccine: 50+ Years  Completed   HPV VACCINES  Aged Out   Meningococcal B Vaccine  Aged Out   Immunization History  Administered Date(s) Administered   Fluzone Influenza virus vaccine,trivalent (IIV3), split virus 04/03/2015   Influenza Inj Mdck Quad Pf 04/19/2016, 05/02/2017, 04/26/2018, 03/26/2019, 04/05/2021, 04/07/2022   Influenza, High Dose Seasonal PF 03/31/2023   Influenza,inj,Quad PF,6+ Mos 03/31/2020   Influenza-Unspecified 03/22/2013, 03/28/2014, 05/02/2017, 04/06/2023   Moderna Covid-19 Fall Seasonal Vaccine 63yrs & older 04/27/2022   Moderna Sars-Covid-2 Vaccination 06/23/2020   PFIZER(Purple Top)SARS-COV-2 Vaccination 08/02/2019, 08/27/2019   PNEUMOCOCCAL CONJUGATE-20 04/11/2023   Pneumococcal Conjugate-13 10/27/2016, 05/02/2017   Pneumococcal Polysaccharide-23 10/03/2012   Respiratory Syncytial Virus Vaccine,Recomb Aduvanted(Arexvy) 04/11/2023   Td (Adult),5 Lf Tetanus Toxid, Preservative Free 11/11/2021   Zoster Recombinant(Shingrix) 05/15/2019, 10/24/2019   Zoster, Live 09/14/2010, 05/15/2019, 10/18/2019     Objective   Physical ExamBP 118/68   Pulse 68   Temp 98 F (36.7 C) (Temporal)   Ht 6' 2 (1.88 m)   Wt 178 lb (80.7 kg)   SpO2 98%   BMI 22.85 kg/m  Wt Readings from Last 10 Encounters:  02/16/24 178 lb (80.7 kg)  02/01/24 179 lb 6.4 oz (81.4 kg)  01/23/23 174 lb (78.9 kg)  01/16/23 175 lb 0.7 oz (79.4 kg)  11/03/22 181 lb 14.1 oz (82.5 kg)  10/28/22 185 lb (83.9 kg)  10/23/22 182 lb 1.6 oz (82.6 kg)  01/17/22 182 lb 12.8 oz (82.9 kg)  11/01/21 188 lb 3.2 oz (85.4 kg)  07/28/21 182 lb (82.6 kg)  Vital signs reviewed.  Nursing notes reviewed. Weight trend reviewed. General Appearance:  Well developed, well  nourished, well-groomed, healthy-appearing male with Body mass index is 22.85 kg/m. No acute distress appreciable.   Skin: Clear and well-hydrated. Pulmonary:  Normal work of breathing at rest, no respiratory distress apparent. SpO2: 98 %  Musculoskeletal: He demonstrates smooth and coordinated movements throughout all major joints.All extremities are intact.  Neurological:  Awake, alert, oriented, and engaged.  No obvious focal neurological deficits or cognitive impairments.  Sensorium seems unclouded.  Psychiatric:  Appropriate mood, pleasant and cooperative demeanor, cheerful and engaged during the exam  Reviewed Results & Data Results LABS   A1c: 6.7 (11/2023)  Assessment & Plan S/P CABG x 4 Coronary artery disease involving native coronary artery of native heart with other form of angina pectoris (HCC) Coronary artery disease with prior myocardial infarction and coronary artery bypass grafting   Coronary artery disease with myocardial infarctions in 2022 and 2024, post-coronary artery bypass grafting, and stent placement in 1999 requiring balloon angioplasty. Currently well-managed with good blood flow. Continue follow-up with  cardiologist. Consult cardiologist to confirm atorvastatin  dosage. Maintain current medication regimen as per cardiologist's guidance. Psoriatic arthritis (HCC) Managed with methotrexate . Transitioning care to a rheumatologist in [facility] after December appointment with Dr. Eduard. Refer to rheumatologist in [place] for ongoing management. Continue methotrexate  as prescribed. Type 2 diabetes mellitus with peripheral angiopathy (HCC) Well-controlled on glimepiride , metformin , and Jardiance . Last HbA1c was 6.7 in June. Management to be taken over by internal medicine. Refill glimepiride , metformin , and Jardiance . Schedule follow-up in January with blood work including HbA1c. B12 deficiency Managed with supplementation. No recent deficiency noted. Continue B12  supplementation. Vitamin D  deficiency Managed with supplementation. No recent deficiency noted. Continue vitamin D  supplementation. Constipation, unspecified constipation type Continue(s) with daily stool softener. Major depressive disorder, recurrent, mild (HCC) On Zoloft  and mirtazapine . Zoloft  increased to 100 mg. Plans to discontinue mirtazapine  due to satisfactory control with Zoloft . Discontinue mirtazapine . Continue Zoloft  100 mg daily. Monitor mood and anxiety symptoms. Memory impairment Managed with donepezil . No significant side effects. Shared decision making- he agreed to consult a neurologist for further evaluation. Refer to neurologist. Continue donepezil . Mixed hyperlipidemia On atorvastatin . Dosage to be confirmed with cardiologist due to previous discrepancies. Consult cardiologist to confirm atorvastatin  dosage. Refill atorvastatin  as per cardiologist's recommendation. Pseudophakia of both eyes  Cervical disc disease Post-surgical status following cervical fusion in 2022.    ICD-10-CM   1. S/P CABG x 4  Z95.1 nitroGLYCERIN  (NITROSTAT ) 0.4 MG SL tablet    2. Psoriatic arthritis (HCC)  L40.50 Ambulatory referral to Rheumatology    CANCELED: Ambulatory referral to Rheumatology    3. Type 2 diabetes mellitus with peripheral angiopathy (HCC)  E11.51 docusate sodium  (COLACE) 100 MG capsule    empagliflozin  (JARDIANCE ) 10 MG TABS tablet    glimepiride  (AMARYL ) 4 MG tablet    metFORMIN  (GLUCOPHAGE ) 500 MG tablet    cyanocobalamin  (VITAMIN B12) 1000 MCG tablet    4. B12 deficiency  E53.8 cyanocobalamin  (VITAMIN B12) 1000 MCG tablet    5. Vitamin D  deficiency  E55.9 Cholecalciferol  50 MCG (2000 UT) CAPS    6. Constipation, unspecified constipation type  K59.00 docusate sodium  (COLACE) 100 MG capsule    Polyethyl Glycol-Propyl Glycol (SYSTANE) 0.4-0.3 % SOLN    7. Major depressive disorder, recurrent, mild (HCC)  F33.0 mirtazapine  (REMERON ) 15 MG tablet    sertraline   (ZOLOFT ) 50 MG tablet    8. Memory impairment  R41.3 donepezil  (ARICEPT ) 10 MG tablet    Ambulatory referral to Neurology    9. Mixed hyperlipidemia  E78.2 fenofibrate  160 MG tablet    10. Coronary artery disease involving native coronary artery of native heart with other form of angina pectoris (HCC)  I25.118 nitroGLYCERIN  (NITROSTAT ) 0.4 MG SL tablet    ticagrelor  (BRILINTA ) 90 MG TABS tablet    11. Pseudophakia of both eyes  Z96.1     12. Cervical disc disease  M50.90      Ambulatory referral to Neurology       Comments: Follow up donepizil management and determine if memory loss is occurring and if medication(s) should be adjusted.    Ambulatory referral to Rheumatology       Comments: Transferring from [facility], who manages but wants closer rheumatology - no rush as he has follow up with [facility] in December 2025 so probably first visit spring-summer 2026     Diagnoses and all orders for this visit: S/P CABG x 4 -     nitroGLYCERIN  (NITROSTAT ) 0.4 MG SL tablet; Place 1 tablet (0.4 mg total)  under the tongue every 5 (five) minutes as needed for chest pain. Psoriatic arthritis (HCC) -     Ambulatory referral to Rheumatology Type 2 diabetes mellitus with peripheral angiopathy (HCC) -     docusate sodium  (COLACE) 100 MG capsule; Take 1 capsule (100 mg total) by mouth daily as needed for mild constipation. -     empagliflozin  (JARDIANCE ) 10 MG TABS tablet; Take 1 tablet (10 mg total) by mouth daily before breakfast. -     glimepiride  (AMARYL ) 4 MG tablet; Take 1 tablet (4 mg total) by mouth daily with breakfast. -     metFORMIN  (GLUCOPHAGE ) 500 MG tablet; Take 1 tablet (500 mg total) by mouth 2 (two) times daily with a meal. -     cyanocobalamin  (VITAMIN B12) 1000 MCG tablet; Take 1 tablet (1,000 mcg total) by mouth daily. B12 deficiency -     cyanocobalamin  (VITAMIN B12) 1000 MCG tablet; Take 1 tablet (1,000 mcg total) by mouth daily. Vitamin D  deficiency -     Cholecalciferol  50 MCG  (2000 UT) CAPS; Take 1 capsule (2,000 Units total) by mouth in the morning. Constipation, unspecified constipation type -     docusate sodium  (COLACE) 100 MG capsule; Take 1 capsule (100 mg total) by mouth daily as needed for mild constipation. -     Polyethyl Glycol-Propyl Glycol (SYSTANE) 0.4-0.3 % SOLN; Apply 1 drop to eye 2 (two) times daily as needed. Major depressive disorder, recurrent, mild (HCC) -     mirtazapine  (REMERON ) 15 MG tablet; Take 0.5 tablets (7.5 mg total) by mouth at bedtime. Dose decreased, plan to taper off if tolerated -     sertraline  (ZOLOFT ) 50 MG tablet; Take 1 tablet (50 mg total) by mouth daily. Memory impairment -     donepezil  (ARICEPT ) 10 MG tablet; Take 1 tablet (10 mg total) by mouth at bedtime. -     Ambulatory referral to Neurology Mixed hyperlipidemia -     fenofibrate  160 MG tablet; Take 1 tablet (160 mg total) by mouth in the morning. Coronary artery disease involving native coronary artery of native heart with other form of angina pectoris (HCC) -     nitroGLYCERIN  (NITROSTAT ) 0.4 MG SL tablet; Place 1 tablet (0.4 mg total) under the tongue every 5 (five) minutes as needed for chest pain. -     ticagrelor  (BRILINTA ) 90 MG TABS tablet; Take 1 tablet (90 mg total) by mouth 2 (two) times daily. Pseudophakia of both eyes Cervical disc disease           Additional notes: This document was synthesized by artificial intelligence ([employer]) using HIPAA-compliant recording of the clinical interaction;   We discussed the use of AI scribe software for clinical note transcription with the patient, who gave verbal consent to proceed.    Additional Info: This encounter employed state-of-the-art, real-time, collaborative documentation. The patient actively reviewed and assisted in updating their electronic medical record on a shared screen, ensuring transparency and facilitating joint problem-solving for the problem list, overview, and plan. This approach promotes  accurate, informed care. The treatment plan was discussed and reviewed in detail, including medication safety, potential side effects, and all patient questions. We confirmed understanding and comfort with the plan. Follow-up instructions were established, including contacting the office for any concerns, returning if symptoms worsen, persist, or new symptoms develop, and precautions for potential emergency department visits.  Initial Appointment Goals:  This initial visit focused on establishing a foundation for the patient's care. We collaboratively reviewed his  medical history and medications in detail, updating the chart as shown in the encounter. Given the extensive information, we prioritized addressing his most pressing concerns, which he reported were: New pt  (Pt is present to est care with pcp to have new provider)  While the complexity of the patient's medical picture may necessitate further evaluation in subsequent visits, we were able to develop a preliminary care plan together. To expedite a comprehensive plan at the next visit, we encouraged the patient to gather relevant medical records from previous providers. This collaborative approach will ensure a more complete understanding of the patient's health and inform the development of a personalized care plan. We look forward to continuing the conversation and working together with the patient on achieving his health goals.   Collaborative Documentation:  Today's encounter utilized real-time, dynamic patient engagement.  Patients actively participate by directly reviewing and assisting in updating their medical records through a shared screen. This transparency empowers patients to visually confirm chart updates made by the healthcare provider.  This collaborative approach facilitates problem management as we jointly update the problem list, problem overview, and assessment/plan. Ultimately, this process enhances chart accuracy and completeness,  fostering shared decision-making, patient education, and informed consent for tests and treatments.  Collaborative Treatment Planning:  Treatment plans were discussed and reviewed in detail.  Explained medication safety and potential side effects.  Encouraged participation and answered all patient questions, confirming understanding and comfort with the plan. Encouraged patient to contact our office if they have any questions or concerns. Agreed on patient returning to office if symptoms worsen, persist, or new symptoms develop.  ----------------------------------------------------- Bernardino KANDICE Cone, MD  02/17/2024 11:52 AM  [facility]:  [phone]

## 2024-02-17 ENCOUNTER — Encounter: Payer: Self-pay | Admitting: Internal Medicine

## 2024-02-17 NOTE — Addendum Note (Signed)
 Addended by: Ling Flesch G on: 02/17/2024 10:26 PM   Modules accepted: Level of Service

## 2024-02-17 NOTE — Patient Instructions (Signed)
 It was a pleasure seeing you today! Your health and satisfaction are our top priorities.  Bernardino Cone, MD  VISIT SUMMARY: Today, you visited us  for a transfer of care and medication management. We reviewed your history of coronary artery disease, psoriatic arthritis, type 2 diabetes, depression and anxiety, mild memory impairment, and other health concerns. We discussed your current medications and made plans for follow-up care with specialists.  YOUR PLAN: -CORONARY ARTERY DISEASE: Coronary artery disease is a condition where the blood vessels supplying the heart are narrowed or blocked. Your condition is currently well-managed. Please continue your current medications and follow up with your cardiologist to confirm your atorvastatin  dosage.  -PSORIATIC ARTHRITIS: Psoriatic arthritis is a type of arthritis that affects some people with psoriasis. You are currently managing this with methotrexate  and will transition your care to a rheumatologist in [facility] after your December appointment with Dr. Eduard. Continue taking methotrexate  as prescribed.  -TYPE 2 DIABETES MELLITUS: Type 2 diabetes is a condition that affects the way your body processes blood sugar. Your diabetes is well-controlled with glimepiride , metformin , and Jardiance . We will refill these medications and schedule a follow-up in January with blood work, including HbA1c.  -VITAMIN B12 DEFICIENCY: Vitamin B12 deficiency occurs when your body doesn't have enough of this vitamin, which is important for nerve function and blood cell production. Continue your B12 supplementation as you have been.  -CERVICAL DISC DISEASE: Cervical disc disease involves issues with the discs in your neck. You had fusion surgery in 2022, and no further action is needed at this time.  -HYPERLIPIDEMIA: Hyperlipidemia is a condition with high levels of fats (lipids) in your blood. You are on atorvastatin  for this. Please consult your cardiologist to confirm  the dosage and continue taking it as recommended.  -DEPRESSION AND ANXIETY: Depression and anxiety are mental health conditions that affect your mood and feelings. You are on Zoloft  and mirtazapine . We will discontinue mirtazapine  and continue Zoloft  at 100 mg daily. Monitor your mood and anxiety symptoms.  -MILD COGNITIVE IMPAIRMENT: Mild cognitive impairment is a slight but noticeable decline in cognitive abilities, including memory and thinking skills. You are taking donepezil  to manage this. We will refer you to a neurologist in [place] for further evaluation. Continue taking donepezil .  -VITAMIN D  DEFICIENCY: Vitamin D  deficiency occurs when your body doesn't have enough vitamin D , which is important for bone health. Continue your vitamin D  supplementation as you have been.  INSTRUCTIONS: Please follow up with your cardiologist to confirm your atorvastatin  dosage. Schedule a follow-up appointment with us  in January for blood work, including HbA1c. We will also refer you to a neurologist in [place] for further evaluation of your mild cognitive impairment.  Your Providers PCP: Cone Bernardino MATSU, MD,  [phone]) Referring Provider: Cleotilde Oneil FALCON, MD,  [phone]) Care Team Provider: Jeffrie Oneil BROCKS, MD,  [phone]) Care Team Provider: Dela Alm LABOR, MD,  [phone])  NEXT STEPS: [x]  Early Intervention: Schedule sooner appointment, call our on-call services, or go to emergency room if there is any significant Increase in pain or discomfort New or worsening symptoms Sudden or severe changes in your health [x]  Flexible Follow-Up: We recommend a No follow-ups on file. for optimal routine care. This allows for progress monitoring and treatment adjustments. [x]  Preventive Care: Schedule your annual preventive care visit! It's typically covered by insurance and helps identify potential health issues early. [x]  Lab & X-ray Appointments: Incomplete tests scheduled today, or call  to schedule. X-rays: [facility]  [place] (M-F, 8:30am-noon or 1pm-5pm). [x]  Medical Information Release: Sign a release form at front desk to obtain relevant medical information we don't have.  MAKING THE MOST OF OUR FOCUSED 20 MINUTE APPOINTMENTS: [x]   Clearly state your top concerns at the beginning of the visit to focus our discussion [x]   If you anticipate you will need more time, please inform the front desk during scheduling - we can book multiple appointments in the same week. [x]   If you have transportation problems- use our convenient video appointments or ask about transportation support. [x]   We can get down to business faster if you use MyChart to update information before the visit and submit non-urgent questions before your visit. Thank you for taking the time to provide details through MyChart.  Let our nurse know and she can import this information into your encounter documents.  Arrival and Wait Times: [x]   Arriving on time ensures that everyone receives prompt attention. [x]   Early morning (8a) and afternoon (1p) appointments tend to have shortest wait times. [x]   Unfortunately, we cannot delay appointments for late arrivals or hold slots during phone calls.  Getting Answers and Following Up [x]   Simple Questions & Concerns: For quick questions or basic follow-up after your visit, reach us  at [phone] or MyChart messaging. [x]   Complex Concerns: If your concern is more complex, scheduling an appointment might be best. Discuss this with the staff to find the most suitable option. [x]   Lab & Imaging Results: We'll contact you directly if results are abnormal or you don't use MyChart. Most normal results will be on MyChart within 2-3 business days, with a review message from Dr. Jesus. Haven't heard back in 2 weeks? Need results sooner? Contact us  at [phone]. [x]   Referrals: Our referral coordinator will manage specialist referrals. The specialist's office  should contact you within 2 weeks to schedule an appointment. Call us  if you haven't heard from them after 2 weeks.  Staying Connected [x]   MyChart: Activate your MyChart for the fastest way to access results and message us . See the last page of this paperwork for instructions on how to activate.  Bring to Your Next Appointment [x]   Medications: Please bring all your medication bottles to your next appointment to ensure we have an accurate record of your prescriptions. [x]   Health Diaries: If you're monitoring any health conditions at home, keeping a diary of your readings can be very helpful for discussions at your next appointment.  Billing [x]   X-ray & Lab Orders: These are billed by separate companies. Contact the invoicing company directly for questions or concerns. [x]   Visit Charges: Discuss any billing inquiries with our administrative services team.  Your Satisfaction Matters [x]   Share Your Experience: We strive for your satisfaction! If you have any complaints, or preferably compliments, please let Dr. Jesus know directly or contact our Practice Administrators, Manuelita Rubin or [employer], by asking at the front desk.   Reviewing Your Records [x]   Review this early draft of your clinical encounter notes below and the final encounter summary tomorrow on MyChart after its been completed.  All orders placed so far are visible here: S/P CABG x 4 -     Nitroglycerin ; Place 1 tablet (0.4 mg total) under the tongue every 5 (five) minutes as needed for chest pain.  Dispense: 25 tablet; Refill: 3  Psoriatic arthritis (HCC) -     Ambulatory referral to Rheumatology  Type 2 diabetes mellitus with peripheral angiopathy (HCC) -  Docusate Sodium ; Take 1 capsule (100 mg total) by mouth daily as needed for mild constipation.  Dispense: 90 capsule; Refill: 4 -     Empagliflozin ; Take 1 tablet (10 mg total) by mouth daily before breakfast.  Dispense: 90 tablet; Refill: 4 -      Glimepiride ; Take 1 tablet (4 mg total) by mouth daily with breakfast.  Dispense: 30 tablet; Refill: 11 -     metFORMIN  HCl; Take 1 tablet (500 mg total) by mouth 2 (two) times daily with a meal.  Dispense: 180 tablet; Refill: 4 -     Vitamin B-12; Take 1 tablet (1,000 mcg total) by mouth daily.  Dispense: 90 tablet; Refill: 4  B12 deficiency -     Vitamin B-12; Take 1 tablet (1,000 mcg total) by mouth daily.  Dispense: 90 tablet; Refill: 4  Vitamin D  deficiency -     Cholecalciferol ; Take 1 capsule (2,000 Units total) by mouth in the morning.  Dispense: 90 capsule; Refill: 4  Constipation, unspecified constipation type -     Docusate Sodium ; Take 1 capsule (100 mg total) by mouth daily as needed for mild constipation.  Dispense: 90 capsule; Refill: 4 -     Systane; Apply 1 drop to eye 2 (two) times daily as needed.  Dispense: 30 mL; Refill: 3  Major depressive disorder, recurrent, mild (HCC) -     Mirtazapine ; Take 0.5 tablets (7.5 mg total) by mouth at bedtime. Dose decreased, plan to taper off if tolerated  Dispense: 45 tablet; Refill: 4 -     Sertraline  HCl; Take 1 tablet (50 mg total) by mouth daily.  Dispense: 90 tablet; Refill: 4  Memory impairment -     Donepezil  HCl; Take 1 tablet (10 mg total) by mouth at bedtime.  Dispense: 90 tablet; Refill: 4 -     Ambulatory referral to Neurology  Mixed hyperlipidemia -     Fenofibrate ; Take 1 tablet (160 mg total) by mouth in the morning.  Dispense: 90 tablet; Refill: 4  Coronary artery disease involving native coronary artery of native heart with other form of angina pectoris (HCC) -     Nitroglycerin ; Place 1 tablet (0.4 mg total) under the tongue every 5 (five) minutes as needed for chest pain.  Dispense: 25 tablet; Refill: 3 -     Ticagrelor ; Take 1 tablet (90 mg total) by mouth 2 (two) times daily.  Dispense: 60 tablet; Refill: 4  Pseudophakia of both eyes  Cervical disc disease

## 2024-02-17 NOTE — Assessment & Plan Note (Signed)
 Managed with methotrexate . Transitioning care to a rheumatologist in [facility] after December appointment with Dr. Eduard. Refer to rheumatologist in [place] for ongoing management. Continue methotrexate  as prescribed.

## 2024-02-17 NOTE — Assessment & Plan Note (Signed)
 Coronary artery disease with prior myocardial infarction and coronary artery bypass grafting   Coronary artery disease with myocardial infarctions in 2022 and 2024, post-coronary artery bypass grafting, and stent placement in 1999 requiring balloon angioplasty. Currently well-managed with good blood flow. Continue follow-up with cardiologist. Consult cardiologist to confirm atorvastatin  dosage. Maintain current medication regimen as per cardiologist's guidance.

## 2024-02-17 NOTE — Assessment & Plan Note (Signed)
 Post-surgical status following cervical fusion in 2022.

## 2024-02-17 NOTE — Assessment & Plan Note (Signed)
 On Zoloft  and mirtazapine . Zoloft  increased to 100 mg. Plans to discontinue mirtazapine  due to satisfactory control with Zoloft . Discontinue mirtazapine . Continue Zoloft  100 mg daily. Monitor mood and anxiety symptoms.

## 2024-02-17 NOTE — Assessment & Plan Note (Signed)
 Well-controlled on glimepiride , metformin , and Jardiance . Last HbA1c was 6.7 in June. Management to be taken over by internal medicine. Refill glimepiride , metformin , and Jardiance . Schedule follow-up in January with blood work including HbA1c.

## 2024-02-17 NOTE — Assessment & Plan Note (Signed)
 Managed with supplementation. No recent deficiency noted. Continue B12 supplementation.

## 2024-02-20 DIAGNOSIS — H26491 Other secondary cataract, right eye: Secondary | ICD-10-CM | POA: Diagnosis not present

## 2024-02-21 ENCOUNTER — Encounter: Payer: Self-pay | Admitting: Physician Assistant

## 2024-03-27 NOTE — Progress Notes (Signed)
 Remote PPM Transmission

## 2024-03-29 ENCOUNTER — Ambulatory Visit: Attending: Cardiology | Admitting: Cardiology

## 2024-03-29 ENCOUNTER — Encounter: Payer: Self-pay | Admitting: Cardiology

## 2024-03-29 ENCOUNTER — Other Ambulatory Visit (HOSPITAL_COMMUNITY): Payer: Self-pay

## 2024-03-29 DIAGNOSIS — I25118 Atherosclerotic heart disease of native coronary artery with other forms of angina pectoris: Secondary | ICD-10-CM

## 2024-03-29 DIAGNOSIS — Z951 Presence of aortocoronary bypass graft: Secondary | ICD-10-CM

## 2024-03-29 MED ORDER — CLOPIDOGREL BISULFATE 75 MG PO TABS
75.0000 mg | ORAL_TABLET | Freq: Every day | ORAL | 3 refills | Status: AC
  Filled 2024-03-29: qty 90, 90d supply, fill #0
  Filled 2024-06-22: qty 90, 90d supply, fill #1
  Filled 2024-07-12: qty 90, 90d supply, fill #0

## 2024-03-29 MED ORDER — NITROGLYCERIN 0.4 MG SL SUBL
0.4000 mg | SUBLINGUAL_TABLET | SUBLINGUAL | 3 refills | Status: AC | PRN
  Filled 2024-03-29 – 2024-07-12 (×2): qty 25, 1d supply, fill #0

## 2024-03-29 NOTE — Patient Instructions (Addendum)
 Medication Instructions:  Your physician has recommended you make the following change in your medication:  STOP Brilinta  today STOP Asprin today START TOMORROW Plavix Tomorrow take 300 mg, then take 75 mg daily thereafter TAKE Nitroglycerin  as needed   *If you need a refill on your cardiac medications before your next appointment, please call your pharmacy*  Lab Work: None ordered  Testing/Procedures: None ordered  Follow-Up: At [facility], you and your health needs are our priority.  As part of our continuing mission to provide you with exceptional heart care, our providers are all part of one team.  This team includes your primary Cardiologist (physician) and Advanced Practice Providers or APPs (Physician Assistants and Nurse Practitioners) who all work together to provide you with the care you need, when you need it.  Your next appointment:   1 year(s)  Provider:   Oneil Parchment, MD     Thank you for choosing [facility]!!   [phone]   Other Instructions   Clopidogrel Tablets What is this medication? CLOPIDOGREL (kloh PID oh grel) lowers the risk of heart attack, stroke, or blood clots. It prevents blood cells (platelets) from clumping together to form a clot. It belongs to a group of medications called antiplatelets. This medicine may be used for other purposes; ask your health care provider or pharmacist if you have questions. COMMON BRAND NAME(S): Plavix What should I tell my care team before I take this medication? They need to know if you have any of the following conditions: Bleeding disorders Bleeding in the brain Having surgery History of stomach bleeding An unusual or allergic reaction to clopidogrel, other medications, foods, dyes, or preservatives Pregnant or trying to get pregnant Breast-feeding How should I use this medication? Take this medication by mouth with a glass of water. Follow the directions on the prescription label. You  may take this medication with or without food. If it upsets your stomach, take it with food. Take your medication at regular intervals. Do not take it more often than directed. Do not stop taking except on your care team's advice. A special MedGuide will be given to you by the pharmacist with each prescription and refill. Be sure to read this information carefully each time. Talk to your care team about the use of this medication in children. Special care may be needed. Overdosage: If you think you have taken too much of this medicine contact a poison control center or emergency room at once. NOTE: This medicine is only for you. Do not share this medicine with others. What if I miss a dose? If you miss a dose, take it as soon as you can. If it is almost time for your next dose, take only that dose. Do not take double or extra doses. What may interact with this medication? Do not take this medication with the following: Dasabuvir; ombitasvir; paritaprevir; ritonavir Defibrotide Selexipag This medication may also interact with the following: Certain medications that prevent or treat blood clots, such as warfarin NSAIDs, medications for pain and inflammation, such as ibuprofen or naproxen  Opioid medications for pain Repaglinide SNRIs, medications for depression, such as desvenlafaxine, duloxetine, levomilnacipran, venlafaxine  SSRIs, medications for depression, such as citalopram, escitalopram, fluoxetine, fluvoxamine, paroxetine, sertraline  Stomach acid blockers, such as cimetidine, esomeprazole, omeprazole  This list may not describe all possible interactions. Give your health care provider a list of all the medicines, herbs, non-prescription drugs, or dietary supplements you use. Also tell them if you smoke, drink alcohol , or use illegal  drugs. Some items may interact with your medicine. What should I watch for while using this medication? Visit your care team for regular check-ups. Do not stop  taking your medication unless your care team tells you to. Notify your care team and seek emergency services if you develop sudden numbness or weakness of the face, arm, or leg, trouble speaking, confusion, trouble walking, loss of balance or coordination, dizziness, severe headache, or change in vision. These can be signs that your condition has gotten worse. If you are going to have surgery or dental work, tell your care team that you are taking this medication. Certain genetic factors may reduce the effect of this medication. Your care team may use genetic tests to determine treatment. Only take aspirin  if you are instructed to. Low doses of aspirin  are used with this medication to treat some conditions. Taking aspirin  with this medication can increase your risk of bleeding, so you must be careful. Talk to your care team if you have questions. What side effects may I notice from receiving this medication? Side effects that you should report to your care team as soon as possible: Allergic reactions--skin rash, itching, hives, swelling of the face, lips, tongue, or throat Bleeding--bloody or black, tar-like stools, red or dark brown urine, vomiting blood or brown material that looks like coffee grounds, small, red or purple spots on the skin, unusual bleeding or bruising TTP--purple spots on the skin or inside the mouth, pale skin, yellowing skin or eyes, unusual weakness or fatigue, fever, fast or irregular heartbeat, confusion, change in vision, trouble speaking, trouble walking Side effects that usually do not require medical attention (report to your care team if they continue or are bothersome): Diarrhea Headache This list may not describe all possible side effects. Call your doctor for medical advice about side effects. You may report side effects to FDA at 1-800-FDA-1088. Where should I keep my medication? Keep out of the reach of children and pets. Store at room temperature of 59 to 86  degrees F (15 to 30 degrees C). Throw away any unused medication after the expiration date. NOTE: This sheet is a summary. It may not cover all possible information. If you have questions about this medicine, talk to your doctor, pharmacist, or health care provider.  2024 Elsevier/Gold Standard (2021-12-02 00:00:00)

## 2024-03-29 NOTE — Progress Notes (Signed)
 Cardiology Office Note:  .   Date:  03/29/2024  ID:  Shane Ford, DOB [birth year], MRN [id number] PCP: Shane Shane MATSU, MD  [facility] Providers Cardiologist:  Shane Parchment, MD     History of Present Illness: .   Shane Ford is a 78 y.o. male Discussed the use of AI scribe   History of Present Illness Shane Ford Shane Ford is a 78 year old male with coronary artery disease who presents for follow-up of symptomatic PVCs and high-grade heart block.  He is here for follow-up of symptomatic premature ventricular contractions (PVCs) and high-grade heart block, status post pacemaker insertion on July 28, 2021. He feels more energetic since the pacemaker was placed, noting that his heart rates were previously slow at times. No episodes of syncope, chest pain, or shortness of breath have occurred since the pacemaker insertion.  He has a history of coronary artery disease with bypass surgery performed on January 19, 2021. He has experienced dizzy spells and palpitations in the past but currently denies any symptoms of chest pain or shortness of breath. He underwent a left heart catheterization and balloon angioplasty on October 22, 2022, for a bare metal stent placed in 1999.  His current medications include atorvastatin , which was recently reduced from 80 mg to 40 mg by his primary care physician, Jardiance  10 mg, fenofibrate  160 mg, and Brilinta . He also takes aspirin  81 mg for coronary artery disease. His LDL was last recorded at 50 in June 2025.  He mentions that his nitroglycerin  tablets have expired, although he has never had to use them. He prefers obtaining medications through [facility] due to insurance preferences, although he is open to trying the clinic's pharmacy for convenience.  He has recently changed his primary care doctor to Shane Ford, who is located closer to his home in [place].    Studies Reviewed: .        Results LABS LDL: 50 (11/2021) Risk  Assessment/Calculations:            Physical Exam:   VS:  BP 127/74   Pulse 60   Ht 6' 2 (1.88 m)   Wt 171 lb 6.4 oz (77.7 kg)   SpO2 98%   BMI 22.01 kg/m    Wt Readings from Last 3 Encounters:  03/29/24 171 lb 6.4 oz (77.7 kg)  02/16/24 178 lb (80.7 kg)  02/01/24 179 lb 6.4 oz (81.4 kg)    GEN: Well nourished, well developed in no acute distress NECK: No JVD; No carotid bruits CARDIAC: RRR, no murmurs, no rubs, no gallops RESPIRATORY:  Clear to auscultation without rales, wheezing or rhonchi  ABDOMEN: Soft, non-tender, non-distended EXTREMITIES:  No edema; No deformity   ASSESSMENT AND PLAN: .    Assessment and Plan Assessment & Plan Symptomatic premature ventricular contractions (PVCs) No current symptoms of dizziness or palpitations post-pacemaker implantation. Pacemaker functioning well, providing more energy and preventing bradycardia.  Complete heart block, status post pacemaker implantation Complete heart block managed with pacemaker implantation on July 28, 2021. No episodes of syncope or significant bradycardia since implantation. Pacemaker functioning well, improving energy levels.  Coronary artery disease, status post coronary artery bypass grafting (CABG) and percutaneous coronary intervention (PCI) Coronary artery disease with CABG on January 19, 2021, and PCI with balloon angioplasty in April 2024. Currently on dual antiplatelet therapy with Brilinta  and aspirin . Transition to Plavix monotherapy is considered reasonable to minimize bleeding risks while maintaining antiplatelet efficacy. - Discontinue Brilinta  and aspirin . -  Initiate Plavix with a loading dose of 300 mg, followed by 75 mg daily. - Refill nitroglycerin  prescription.  Hyperlipidemia Hyperlipidemia managed with atorvastatin . Recent LDL level was 50 mg/dL in June 2025, which is excellent. Primary care physician reduced atorvastatin  dose from 80 mg to 40 mg, which is deemed acceptable given current  LDL levels. - Continue atorvastatin  40 mg daily.         Signed, Shane Parchment, MD

## 2024-04-03 ENCOUNTER — Other Ambulatory Visit (HOSPITAL_COMMUNITY): Payer: Self-pay

## 2024-04-03 ENCOUNTER — Ambulatory Visit

## 2024-04-03 VITALS — Ht 73.0 in | Wt 171.0 lb

## 2024-04-03 DIAGNOSIS — Z0000 Encounter for general adult medical examination without abnormal findings: Secondary | ICD-10-CM | POA: Diagnosis not present

## 2024-04-03 MED ORDER — TICAGRELOR 90 MG PO TABS: 90.0000 mg | ORAL_TABLET | Freq: Two times a day (BID) | ORAL | 4 refills | Status: DC

## 2024-04-03 MED ORDER — GLIMEPIRIDE 4 MG PO TABS
4.0000 mg | ORAL_TABLET | Freq: Every day | ORAL | 3 refills | Status: DC
  Filled 2024-04-03: qty 90, 90d supply, fill #0

## 2024-04-03 MED ORDER — CYANOCOBALAMIN 1000 MCG PO TABS
1000.0000 ug | ORAL_TABLET | Freq: Every day | ORAL | 4 refills | Status: AC
  Filled 2024-04-03 – 2024-07-12 (×2): qty 90, 90d supply, fill #0

## 2024-04-03 MED ORDER — FLUZONE HIGH-DOSE 0.5 ML IM SUSY
0.5000 mL | PREFILLED_SYRINGE | Freq: Once | INTRAMUSCULAR | 0 refills | Status: AC
  Filled 2024-04-03: qty 0.5, 1d supply, fill #0

## 2024-04-03 MED ORDER — NITROGLYCERIN 0.4 MG SL SUBL: 0.4000 mg | SUBLINGUAL_TABLET | SUBLINGUAL | 3 refills | Status: DC | PRN

## 2024-04-03 MED ORDER — DONEPEZIL HCL 10 MG PO TABS
10.0000 mg | ORAL_TABLET | Freq: Every day | ORAL | 3 refills | Status: DC
  Filled 2024-05-27: qty 90, 90d supply, fill #0

## 2024-04-03 MED ORDER — MIRTAZAPINE 15 MG PO TABS
15.0000 mg | ORAL_TABLET | Freq: Every day | ORAL | 3 refills | Status: DC
  Filled 2024-04-03: qty 90, 90d supply, fill #0

## 2024-04-03 MED ORDER — ATORVASTATIN CALCIUM 40 MG PO TABS
40.0000 mg | ORAL_TABLET | Freq: Every day | ORAL | 3 refills | Status: DC
  Filled 2024-04-03 – 2024-05-27 (×2): qty 90, 90d supply, fill #0

## 2024-04-03 NOTE — Progress Notes (Signed)
 Subjective:   Shane Ford is a 78 y.o. who presents for a Medicare Wellness preventive visit.  As a reminder, Annual Wellness Visits don't include a physical exam, and some assessments may be limited, especially if this visit is performed virtually. We may recommend an in-person follow-up visit with your provider if needed.  Visit Complete: Virtual I connected with  Shane Ford on 04/03/24 by a audio enabled telemedicine application and verified that I am speaking with the correct person using two identifiers.  Patient Location: Home  Provider Location: Home Office  I discussed the limitations of evaluation and management by telemedicine. The patient expressed understanding and agreed to proceed.  Vital Signs: Because this visit was a virtual/telehealth visit, some criteria may be missing or patient reported. Any vitals not documented were not able to be obtained and vitals that have been documented are patient reported.  VideoDeclined- This patient declined Librarian, academic. Therefore the visit was completed with audio only.  Persons Participating in Visit: Patient.  AWV Questionnaire: Yes: Patient Medicare AWV questionnaire was completed by the patient on 03/27/24; I have confirmed that all information answered by patient is correct and no changes since this date.        Objective:    Today's Vitals   04/03/24 1054  Weight: 171 lb (77.6 kg)  Height: 6' 1 (1.854 m)   Body mass index is 22.56 kg/m.     04/03/2024   10:58 AM 10/22/2022    8:01 AM 07/28/2021    9:07 AM 05/04/2021   10:40 AM 01/17/2021    8:08 PM 01/06/2021    3:58 PM 06/29/2020    9:14 AM  Advanced Directives  Does Patient Have a Medical Advance Directive? Yes No Yes Yes No No Yes  Type of Estate agent of [place];Living will  Healthcare Power of [place];Living will Healthcare Power of [place];Living will   Living will;Healthcare Power of  Attorney  Does patient want to make changes to medical advance directive? No - Patient declined  No - Patient declined No - Patient declined   No - Patient declined  Copy of Healthcare Power of Attorney in Chart? Yes - validated most recent copy scanned in chart (See row information)  No - copy requested Yes - validated most recent copy scanned in chart (See row information)     Would patient like information on creating a medical advance directive?     No - Patient declined No - Patient declined     Current Medications (verified) Outpatient Encounter Medications as of 04/03/2024  Medication Sig   acetaminophen  (TYLENOL ) 500 MG tablet Take 1,000 mg by mouth every 6 (six) hours as needed for moderate pain.   atorvastatin  (LIPITOR ) 40 MG tablet Take 40 mg by mouth daily.   Cholecalciferol  50 MCG (2000 UT) CAPS Take 1 capsule (2,000 Units total) by mouth in the morning.   clopidogrel (PLAVIX) 75 MG tablet Take 1 tablet (75 mg total) by mouth daily.   cyanocobalamin  (VITAMIN B12) 1000 MCG tablet Take 1 tablet (1,000 mcg total) by mouth daily.   docusate sodium  (COLACE) 100 MG capsule Take 1 capsule (100 mg total) by mouth daily as needed for mild constipation.   donepezil  (ARICEPT ) 10 MG tablet Take 1 tablet (10 mg total) by mouth at bedtime.   empagliflozin  (JARDIANCE ) 10 MG TABS tablet Take 1 tablet (10 mg total) by mouth daily before breakfast.   fenofibrate  160 MG tablet Take 1 tablet (  160 mg total) by mouth in the morning.   folic acid  (FOLVITE ) 1 MG tablet Take 1 mg by mouth.   glimepiride  (AMARYL ) 4 MG tablet Take 1 tablet (4 mg total) by mouth daily with breakfast.   ipratropium (ATROVENT) 0.03 % nasal spray Place 2 sprays into both nostrils 2 (two) times daily.   metFORMIN  (GLUCOPHAGE ) 500 MG tablet Take 1 tablet (500 mg total) by mouth 2 (two) times daily with a meal.   methotrexate  (RHEUMATREX) 2.5 MG tablet Take 12.5 mg by mouth every Wednesday. Take 6 tablets (12.5mg ) once a week on  Wednesdays. Caution:Chemotherapy. Protect from light.   nitroGLYCERIN  (NITROSTAT ) 0.4 MG SL tablet Place 1 tablet (0.4 mg total) under the tongue every 5 (five) minutes as needed for chest pain.   Polyethyl Glycol-Propyl Glycol (SYSTANE) 0.4-0.3 % SOLN Apply 1 drop to eye 2 (two) times daily as needed.   sertraline  (ZOLOFT ) 100 MG tablet Take 100 mg by mouth daily.   No facility-administered encounter medications on file as of 04/03/2024.    Allergies (verified) Patient has no known allergies.   History: Past Medical History:  Diagnosis Date   Acute inferior myocardial infarction (HCC) 10/22/2022   Acute myocardial infarction [facility])    Arthritis    pt states he was diagnosed 10+ years ago; psoriatic arthritis   Chest pain at rest 01/17/2021   Coronary artery disease    Depression    pt diagnosed 10+ years ago   Diabetes mellitus (HCC)    Dysrhythmia 01/17/2021   first degree AV block, 2nd degree AVB, Mobitz I in setting of MI   Hyperlipidemia    Hypertension    ST elevation myocardial infarction (STEMI) [facility])    Past Surgical History:  Procedure Laterality Date   ANTERIOR CERVICAL DECOMP/DISCECTOMY FUSION N/A 05/12/2021   Procedure: ANTERIOR CERVICAL DECOMPRESSION FUSION CERVICAL 5- CERVICAL 6 WITH INSTRUMENTATION AND ALLOGRAFT;  Surgeon: Beuford Anes, MD;  Location: [facility] OR;  Service: Orthopedics;  Laterality: N/A;   CARDIAC CATHETERIZATION     CORONARY ARTERY BYPASS GRAFT N/A 01/19/2021   Procedure: CORONARY ARTERY BYPASS GRAFTING (CABG) TIMES FOUR, ON PUMP, USING LEFT INTERNAL MAMMARY ARTERY AND ENDOSCOPICALLY HARVESTED GREATER SAPHENOUS VEIN;  Surgeon: Shyrl Linnie KIDD, MD;  Location: [facility] OR;  Service: Open Heart Surgery;  Laterality: N/A;  flow trac   CORONARY/GRAFT ACUTE MI REVASCULARIZATION N/A 10/22/2022   Procedure: Coronary/Graft Acute MI Revascularization;  Surgeon: Dann Candyce RAMAN, MD;  Location: [facility] INVASIVE CV LAB;  Service: Cardiovascular;  Laterality: N/A;    ENDOVEIN HARVEST OF GREATER SAPHENOUS VEIN Right 01/19/2021   Procedure: ENDOVEIN HARVEST OF GREATER SAPHENOUS VEIN;  Surgeon: Shyrl Linnie KIDD, MD;  Location: [facility] OR;  Service: Open Heart Surgery;  Laterality: Right;   EYE SURGERY Bilateral 2021   cataracts replaced in both eyes   LEFT HEART CATH AND CORONARY ANGIOGRAPHY N/A 01/17/2021   Procedure: LEFT HEART CATH AND CORONARY ANGIOGRAPHY;  Surgeon: Claudene Victory ORN, MD;  Location: [facility] INVASIVE CV LAB;  Service: Cardiovascular;  Laterality: N/A;   LEFT HEART CATH AND CORS/GRAFTS ANGIOGRAPHY N/A 10/22/2022   Procedure: LEFT HEART CATH AND CORS/GRAFTS ANGIOGRAPHY;  Surgeon: Dann Candyce RAMAN, MD;  Location: [facility] INVASIVE CV LAB;  Service: Cardiovascular;  Laterality: N/A;   PACEMAKER IMPLANT N/A 07/28/2021   Procedure: PACEMAKER IMPLANT;  Surgeon: Waddell Danelle ORN, MD;  Location: [facility] INVASIVE CV LAB;  Service: Cardiovascular;  Laterality: N/A;   TEE WITHOUT CARDIOVERSION N/A 01/19/2021   Procedure: TRANSESOPHAGEAL ECHOCARDIOGRAM (TEE);  Surgeon:  Shyrl Linnie KIDD, MD;  Location: [facility] OR;  Service: Open Heart Surgery;  Laterality: N/A;   History reviewed. No pertinent family history. Social History   Socioeconomic History   Marital status: Married    Spouse name: Not on file   Number of children: 2   Years of education: 14   Highest education level: Associate degree: occupational, Scientist, product/process development, or vocational program  Occupational History   Occupation: Retired  Tobacco Use   Smoking status: Never   Smokeless tobacco: Never  Vaping Use   Vaping status: Never Used  Substance and Sexual Activity   Alcohol  use: Never   Drug use: Never   Sexual activity: Not on file  Other Topics Concern   Not on file  Social History Narrative   Not on file   Social Drivers of Health   Financial Resource Strain: Low Risk  (04/03/2024)   Overall Financial Resource Strain (CARDIA)    Difficulty of Paying Living Expenses: Not hard at all  Food Insecurity: No  Food Insecurity (04/03/2024)   Hunger Vital Sign    Worried About Running Out of Food in the Last Year: Never true    Ran Out of Food in the Last Year: Never true  Transportation Needs: No Transportation Needs (04/03/2024)   PRAPARE - Administrator, Civil Service (Medical): No    Lack of Transportation (Non-Medical): No  Physical Activity: Sufficiently Active (04/03/2024)   Exercise Vital Sign    Days of Exercise per Week: 5 days    Minutes of Exercise per Session: 40 min  Stress: No Stress Concern Present (04/03/2024)   [employer] of Occupational Health - Occupational Stress Questionnaire    Feeling of Stress: Not at all  Recent Concern: Stress - Stress Concern Present (02/09/2024)   [employer] of Occupational Health - Occupational Stress Questionnaire    Feeling of Stress: To some extent  Social Connections: Moderately Integrated (04/03/2024)   Social Connection and Isolation Panel    Frequency of Communication with Friends and Family: Twice a week    Frequency of Social Gatherings with Friends and Family: Three times a week    Attends Religious Services: 1 to 4 times per year    Active Member of Clubs or Organizations: No    Attends Banker Meetings: Never    Marital Status: Married  Recent Concern: Social Connections - Moderately Isolated (02/09/2024)   Social Connection and Isolation Panel    Frequency of Communication with Friends and Family: Once a week    Frequency of Social Gatherings with Friends and Family: Once a week    Attends Religious Services: More than 4 times per year    Active Member of [employer] or Organizations: No    Attends Engineer, structural: Not on file    Marital Status: Married    Tobacco Counseling Counseling given: Not Answered    Clinical Intake:  Pre-visit preparation completed: Yes  Pain : No/denies pain     BMI - recorded: 22.56 Nutritional Status: BMI of 19-24  Normal Nutritional Risks:  None  Lab Results  Component Value Date   HGBA1C 6.8 (H) 10/22/2022   HGBA1C 6.5 (H) 05/04/2021   HGBA1C 8.5 (H) 01/18/2021     How often do you need to have someone help you when you read instructions, pamphlets, or other written materials from your doctor or pharmacy?: 1 - Never  Interpreter Needed?: No  Information entered by :: Ellouise Haws, LPN   Activities  of Daily Living     03/27/2024   12:25 PM  In your present state of health, do you have any difficulty performing the following activities:  Hearing? 0  Vision? 0  Difficulty concentrating or making decisions? 1  Walking or climbing stairs? 0  Dressing or bathing? 0  Doing errands, shopping? 0  Preparing Food and eating ? N  Using the Toilet? N  In the past six months, have you accidently leaked urine? N  Do you have problems with loss of bowel control? N  Managing your Medications? N  Managing your Finances? N  Housekeeping or managing your Housekeeping? N    Patient Care Team: Jesus Bernardino MATSU, MD as PCP - General (Internal Medicine) Jeffrie Oneil BROCKS, MD as PCP - Cardiology (Cardiology) Dasher, Alm LABOR, MD (Dermatology)  I have updated your Care Teams any recent Medical Services you may have received from other providers in the past year.     Assessment:   This is a routine wellness examination for [facility]  Hearing/Vision screen Hearing Screening - Comments:: Pt denies any hearing issues  Vision Screening - Comments:: Wears rx glasses - up to date with routine eye exams with Dr Carolee in [place]    Goals Addressed             This Visit's Progress    Patient Stated       Walking a mile to 2 a day        Depression Screen     04/03/2024   10:58 AM 02/16/2024    8:55 AM 01/09/2023    5:15 PM 11/03/2022   12:24 PM 04/13/2021    2:34 PM  PHQ 2/9 Scores  PHQ - 2 Score 1 1 2 1  0  PHQ- 9 Score 1 1 3 3      Fall Risk     03/27/2024   12:25 PM 01/16/2023   10:35 AM 01/11/2023   10:30 AM  01/09/2023   10:25 AM 01/06/2023   10:21 AM  Fall Risk   Falls in the past year? 0 0 0 0 0  Number falls in past yr: 0 0 0 0 0  Injury with Fall? 0 0 0 0 0  Risk for fall due to : No Fall Risks Impaired balance/gait;Impaired vision;Medication side effect Impaired balance/gait;Impaired vision;Medication side effect Impaired balance/gait;Impaired vision;Medication side effect Impaired balance/gait;Impaired vision;Medication side effect  Follow up Falls prevention discussed Falls evaluation completed Falls evaluation completed Falls evaluation completed Falls evaluation completed    MEDICARE RISK AT HOME:  Medicare Risk at Home Any stairs in or around the home?: (Patient-Rptd) No If so, are there any without handrails?: No Home free of loose throw rugs in walkways, pet beds, electrical cords, etc?: (Patient-Rptd) Yes Adequate lighting in your home to reduce risk of falls?: (Patient-Rptd) Yes Life alert?: (Patient-Rptd) No Use of a cane, walker or w/c?: (Patient-Rptd) No Grab bars in the bathroom?: (Patient-Rptd) Yes Shower chair or bench in shower?: (Patient-Rptd) Yes Elevated toilet seat or a handicapped toilet?: (Patient-Rptd) No  TIMED UP AND GO:  Was the test performed?  No  Cognitive Function: 6CIT completed        04/03/2024   11:01 AM  6CIT Screen  What Year? 0 points  What month? 0 points  What time? 0 points  Count back from 20 0 points  Months in reverse 0 points  Repeat phrase 0 points  Total Score 0 points    Immunizations Immunization History  Administered  Date(s) Administered   Fluzone Influenza virus vaccine,trivalent (IIV3), split virus 04/03/2015   INFLUENZA, HIGH DOSE SEASONAL PF 03/31/2023   Influenza Inj Mdck Quad Pf 04/19/2016, 05/02/2017, 04/26/2018, 03/26/2019, 04/05/2021, 04/07/2022   Influenza,inj,Quad PF,6+ Mos 03/31/2020   Influenza-Unspecified 03/22/2013, 03/28/2014, 05/02/2017, 04/06/2023   Moderna Covid-19 Fall Seasonal Vaccine 8yrs & older  04/27/2022   Moderna Sars-Covid-2 Vaccination 06/23/2020   PFIZER(Purple Top)SARS-COV-2 Vaccination 08/02/2019, 08/27/2019   PNEUMOCOCCAL CONJUGATE-20 04/11/2023   Pneumococcal Conjugate-13 10/27/2016, 05/02/2017   Pneumococcal Polysaccharide-23 10/03/2012   Respiratory Syncytial Virus Vaccine,Recomb Aduvanted(Arexvy) 04/11/2023   Td (Adult),5 Lf Tetanus Toxid, Preservative Free 11/11/2021   Zoster Recombinant(Shingrix) 05/15/2019, 10/24/2019   Zoster, Live 09/14/2010, 05/15/2019, 10/18/2019    Screening Tests Health Maintenance  Topic Date Due   FOOT EXAM  Never done   Diabetic kidney evaluation - Urine ACR  Never done   Hepatitis C Screening  Never done   Zoster Vaccines- Shingrix (2 of 2) 12/19/2019   DTaP/Tdap/Td (1 - Tdap) 11/12/2021   HEMOGLOBIN A1C  04/23/2023   Diabetic kidney evaluation - eGFR measurement  01/18/2024   Influenza Vaccine  01/26/2024   COVID-19 Vaccine (5 - 2025-26 season) 02/26/2024   OPHTHALMOLOGY EXAM  06/04/2024   Medicare Annual Wellness (AWV)  04/03/2025   Pneumococcal Vaccine: 50+ Years  Completed   Meningococcal B Vaccine  Aged Out    Health Maintenance Items Addressed: Vaccines Due: in health maintenance , Diabetic Foot Exam recommended, Labs Due in health maintenance   Additional Screening:  Vision Screening: Recommended annual ophthalmology exams for early detection of glaucoma and other disorders of the eye. Is the patient up to date with their annual eye exam?  No  Who is the provider or what is the name of the office in which the patient attends annual eye exams? Dr Carolee in [place]   Dental Screening: Recommended annual dental exams for proper oral hygiene  Community Resource Referral / Chronic Care Management: CRR required this visit?  No   CCM required this visit?  No   Plan:    I have personally reviewed and noted the following in the patient's chart:   Medical and social history Use of alcohol , tobacco or illicit drugs   Current medications and supplements including opioid prescriptions. Patient is not currently taking opioid prescriptions. Functional ability and status Nutritional status Physical activity Advanced directives List of other physicians Hospitalizations, surgeries, and ER visits in previous 12 months Vitals Screenings to include cognitive, depression, and falls Referrals and appointments  In addition, I have reviewed and discussed with patient certain preventive protocols, quality metrics, and best practice recommendations. A written personalized care plan for preventive services as well as general preventive health recommendations were provided to patient.   Ellouise VEAR Haws, LPN   [id number]   After Visit Summary: (MyChart) Due to this being a telephonic visit, the after visit summary with patients personalized plan was offered to patient via MyChart   Notes: Nothing significant to report at this time.

## 2024-04-03 NOTE — Patient Instructions (Signed)
 Shane Ford,  Thank you for taking the time for your Medicare Wellness Visit. I appreciate your continued commitment to your health goals. Please review the care plan we discussed, and feel free to reach out if I can assist you further.  Medicare recommends these wellness visits once per year to help you and your care team stay ahead of potential health issues. These visits are designed to focus on prevention, allowing your provider to concentrate on managing your acute and chronic conditions during your regular appointments.  Please note that Annual Wellness Visits do not include a physical exam. Some assessments may be limited, especially if the visit was conducted virtually. If needed, we may recommend a separate in-person follow-up with your provider.  Ongoing Care Seeing your primary care provider every 3 to 6 months helps us  monitor your health and provide consistent, personalized care.   Referrals If a referral was made during today's visit and you haven't received any updates within two weeks, please contact the referred provider directly to check on the status.  Recommended Screenings:  Health Maintenance  Topic Date Due   Complete foot exam   Never done   Yearly kidney health urinalysis for diabetes  Never done   Hepatitis C Screening  Never done   Zoster (Shingles) Vaccine (2 of 2) 12/19/2019   DTaP/Tdap/Td vaccine (1 - Tdap) 11/12/2021   Hemoglobin A1C  04/23/2023   Yearly kidney function blood test for diabetes  01/18/2024   Flu Shot  01/26/2024   COVID-19 Vaccine (5 - 2025-26 season) 02/26/2024   Eye exam for diabetics  06/04/2024   Medicare Annual Wellness Visit  12/03/2024   Pneumococcal Vaccine for age over 28  Completed   Meningitis B Vaccine  Aged Out       04/03/2024   10:58 AM  Advanced Directives  Does Patient Have a Medical Advance Directive? Yes  Type of Estate agent of [place];Living will  Does patient want to make changes to  medical advance directive? No - Patient declined  Copy of Healthcare Power of Attorney in Chart? Yes - validated most recent copy scanned in chart (See row information)   Advance Care Planning is important because it: Ensures you receive medical care that aligns with your values, goals, and preferences. Provides guidance to your family and loved ones, reducing the emotional burden of decision-making during critical moments.  Vision: Annual vision screenings are recommended for early detection of glaucoma, cataracts, and diabetic retinopathy. These exams can also reveal signs of chronic conditions such as diabetes and high blood pressure.  Dental: Annual dental screenings help detect early signs of oral cancer, gum disease, and other conditions linked to overall health, including heart disease and diabetes.  Please see the attached documents for additional preventive care recommendations.

## 2024-04-04 ENCOUNTER — Other Ambulatory Visit (HOSPITAL_COMMUNITY): Payer: Self-pay

## 2024-04-17 DIAGNOSIS — D2262 Melanocytic nevi of left upper limb, including shoulder: Secondary | ICD-10-CM | POA: Diagnosis not present

## 2024-04-17 DIAGNOSIS — D225 Melanocytic nevi of trunk: Secondary | ICD-10-CM | POA: Diagnosis not present

## 2024-04-17 DIAGNOSIS — D2271 Melanocytic nevi of right lower limb, including hip: Secondary | ICD-10-CM | POA: Diagnosis not present

## 2024-04-17 DIAGNOSIS — L570 Actinic keratosis: Secondary | ICD-10-CM | POA: Diagnosis not present

## 2024-04-17 DIAGNOSIS — D2261 Melanocytic nevi of right upper limb, including shoulder: Secondary | ICD-10-CM | POA: Diagnosis not present

## 2024-04-17 DIAGNOSIS — Z85828 Personal history of other malignant neoplasm of skin: Secondary | ICD-10-CM | POA: Diagnosis not present

## 2024-04-18 ENCOUNTER — Encounter: Payer: Self-pay | Admitting: Internal Medicine

## 2024-04-22 NOTE — Telephone Encounter (Signed)
 read by Tod Macario Delos Macario at 7:22PM on 04/21/2024.

## 2024-04-24 ENCOUNTER — Encounter: Payer: Self-pay | Admitting: Internal Medicine

## 2024-04-24 ENCOUNTER — Ambulatory Visit (INDEPENDENT_AMBULATORY_CARE_PROVIDER_SITE_OTHER): Admitting: Internal Medicine

## 2024-04-24 ENCOUNTER — Other Ambulatory Visit (HOSPITAL_COMMUNITY): Payer: Self-pay

## 2024-04-24 VITALS — BP 120/62 | HR 72 | Temp 98.0°F | Ht 73.0 in | Wt 175.4 lb

## 2024-04-24 DIAGNOSIS — N401 Enlarged prostate with lower urinary tract symptoms: Secondary | ICD-10-CM | POA: Diagnosis not present

## 2024-04-24 DIAGNOSIS — F5101 Primary insomnia: Secondary | ICD-10-CM | POA: Diagnosis not present

## 2024-04-24 DIAGNOSIS — E1151 Type 2 diabetes mellitus with diabetic peripheral angiopathy without gangrene: Secondary | ICD-10-CM

## 2024-04-24 DIAGNOSIS — F341 Dysthymic disorder: Secondary | ICD-10-CM

## 2024-04-24 LAB — POCT GLYCOSYLATED HEMOGLOBIN (HGB A1C): Hemoglobin A1C: 6.2 % — AB (ref 4.0–5.6)

## 2024-04-24 MED ORDER — ARIPIPRAZOLE 2 MG PO TABS
2.0000 mg | ORAL_TABLET | Freq: Every day | ORAL | 2 refills | Status: DC
  Filled 2024-04-24: qty 30, 30d supply, fill #0
  Filled 2024-05-19: qty 30, 30d supply, fill #1

## 2024-04-24 NOTE — Assessment & Plan Note (Addendum)
 Depression and anxiety   He experiences chronic depression with anxiety, marked by sudden depressive episodes. Trials with mirtazapine , venlafaxine , and sertraline  have been largely unsuccessful. Currently on sertraline  100 mg, he shows inadequate response and experiences side effects at higher doses. Bipolar depression is considered, but there is no history of mania. Anxiety has improved since stopping mirtazapine . Due to inadequate response, new medication options are being considered. He will be referred to psychiatry for specialized mood disorder management. Aripiprazole 2 mg will be prescribed as needed for sudden depressive episodes, pending insurance approval. A letter of medical necessity will be written for aripiprazole coverage. Potential side effects and benefits of aripiprazole, including its sedative properties that may aid sleep, will be discussed. He is advised to avoid distressing news to help manage mood.  Letter of Medical Necessity  This letter is to document the medical necessity for advanced pharmacologic interventions in a 78 year old male with a diagnosis of major depressive disorder, recurrent, severe, and treatment-resistant, with comorbid anxiety and cognitive impairment. The patient has a longstanding history of depression and anxiety, with multiple failed or partially effective trials of first- and second-line antidepressants, including SSRIs, SNRIs, and mirtazapine , as well as intolerance to several agents due to adverse effects (notably, constipation, vivid dreams, and cognitive slowing).  Despite adequate trials of sertraline , venlafaxine , and mirtazapine , the patient continues to experience significant depressive symptoms, poor motivation, and sleep disturbance. He has not achieved sustained remission, meeting the accepted definition of treatment-resistant depression (TRD) as failure to respond to at least two adequate antidepressant trials.[1] This condition is associated with  increased morbidity, functional decline, and risk of further medical complications in older adults.[2][1]  Current evidence and expert consensus recommend augmentation strategies as the best-supported pharmacologic approach for TRD in older adults. Randomized controlled trials and systematic reviews demonstrate that augmentation with second-generation antipsychotics (notably aripiprazole and quetiapine) significantly improves remission rates and psychological well-being compared to switching antidepressants or monotherapy, with aripiprazole showing the strongest evidence and favorable tolerability in this age group.[1][3][4][5] Other evidence-based options include augmentation with lithium (with careful monitoring), bupropion, or consideration of novel agents such as esketamine for severe, refractory cases.[6][1][7][2][8]  Given the patient's complex comorbidities, polypharmacy, and history of adverse effects, careful selection and monitoring of augmentation agents is essential. Non-pharmacologic strategies (e.g., psychotherapy, neurostimulation) may be considered adjunctively, but pharmacologic optimization remains the standard of care for TRD in this population.[1][2][8]  In summary, this patient meets criteria for treatment-resistant depression and requires access to evidence-based augmentation strategies and/or novel pharmacologic agents to achieve remission and improve quality of life. Coverage for these interventions is medically necessary and supported by current clinical guidelines and high-quality evidence.[1][3][7][2][4][5]  If further documentation or clarification is required, please contact our office. References Treatment-Resistant Depression in Older Adults. Steffens DC. The [place] Journal of Medicine. 2024;390(7):630-639. doi:10.1056/NEJMcp2305428. A Review of Therapeutics for Treatment-Resistant Depression in the Older Adult. Blaszczyk AT, Hayward M, Le J. Drugs & Aging.  2023;40(9):785-813. doi:10.1007/s40266-023-01051-3. Antidepressant Augmentation versus Switch in Treatment-Resistant Geriatric Depression. Parthenia NETT, [place], Roose ARIZONA, et al. The [place] Journal of Medicine. 2023;388(12):1067-1079. doi:10.1056/NEJMoa2204462. Pharmacological Interventions for Treatment-Resistant Depression in Adults. Storm SHAUNNA Tonny GORMAN Trudy CJ, et al. The Cochrane Database of Systematic Reviews. 2019;12:CD010557. doi:10.1002/14651858.RI989442.ela7. Management of Depression in Older Adults: A Review. Hiram RM, Reynolds CF. JAMA. 2017;317(20):2114-2122. doi:10.1001/jama.7982.4293. FDA Orange Book. FDA Orange Book. Pharmacological Treatments for Patients With Treatment-Resistant Depression. Ruberto VL, Jha MK, Murrough JW. [employer] ([place], [place]). 2020;13(6):E116. doi:10.3390/ph13060116. Management of Major Depression: Guidelines From  the VA/DoD. American Academy of Family Physicians [phone]).

## 2024-04-24 NOTE — Assessment & Plan Note (Signed)
 Lower urinary tract symptoms due to benign prostatic hyperplasia   Nocturia, likely secondary to benign prostatic hyperplasia, contributes to sleep disturbances. There is no current management for prostate issues. He will be referred to urology for evaluation and management of benign prostatic hyperplasia. The possibility of surgical intervention, such as TURP, to alleviate bladder obstruction and improve sleep will be discussed.

## 2024-04-24 NOTE — Patient Instructions (Addendum)
 It was a pleasure seeing you today! Your health and satisfaction are our top priorities.  Shane Cone, MD  VISIT SUMMARY: Today, we discussed your ongoing issues with depression, anxiety, and sleep disturbances. We reviewed your current medications and their effectiveness, and we explored new treatment options to better manage your symptoms. Additionally, we addressed your sleep issues and the potential impact of your prostate health on your sleep quality.  YOUR PLAN: -DEPRESSION AND ANXIETY: You have chronic depression and anxiety, with sudden depressive episodes. Your current medication, sertraline , is not providing adequate relief. We are considering new medication options and will refer you to a psychiatrist for specialized care. Aripiprazole 2 mg will be prescribed as needed for sudden depressive episodes, pending insurance approval. This medication may also help with sleep. Please avoid distressing news to help manage your mood.  -INSOMNIA: You have chronic insomnia, often waking up at 3:30 AM, which is likely worsened by frequent urination at night. We will provide you with Cognitive Behavioral Therapy for Insomnia (CBTi) instructions to improve your sleep habits. Reducing fluid intake in the evening may help decrease nighttime urination. Aripiprazole may also help improve your sleep due to its sedative effects.  -LOWER URINARY TRACT SYMPTOMS DUE TO BENIGN PROSTATIC HYPERPLASIA: Your frequent nighttime urination is likely due to an enlarged prostate. We will refer you to a urologist for further evaluation and management. Surgical options, such as TURP, may be considered to relieve bladder obstruction and improve your sleep.  INSTRUCTIONS: You will be referred to a psychiatrist for specialized mood disorder management and to a urologist for evaluation of your prostate health. We will also write a letter of medical necessity for aripiprazole coverage. Please follow the CBTi instructions provided  and try to limit fluid intake in the evening to help with your sleep.  Your Providers PCP: Ford Shane MATSU, MD,  [phone]) Referring Provider: Cone Shane MATSU, MD,  [phone]) Care Team Provider: Jeffrie Oneil BROCKS, MD,  [phone]) Care Team Provider: Dela Alm LABOR, MD,  [phone])  NEXT STEPS: [x]  Early Intervention: Schedule sooner appointment, call our on-call services, or go to emergency room if there is any significant Increase in pain or discomfort New or worsening symptoms Sudden or severe changes in your health [x]  Flexible Follow-Up: We recommend a Return in about 1 month (around 05/25/2024). for optimal routine care. This allows for progress monitoring and treatment adjustments. [x]  Preventive Care: Schedule your annual preventive care visit! It's typically covered by insurance and helps identify potential health issues early. [x]  Lab & X-ray Appointments: Incomplete tests scheduled today, or call to schedule. X-rays: [facility] (M-F, 8:30am-noon or 1pm-5pm). [x]  Medical Information Release: Sign a release form at front desk to obtain relevant medical information we don't have.  MAKING THE MOST OF OUR FOCUSED 20 MINUTE APPOINTMENTS: [x]   Clearly state your top concerns at the beginning of the visit to focus our discussion [x]   If you anticipate you will need more time, please inform the front desk during scheduling - we can book multiple appointments in the same week. [x]   If you have transportation problems- use our convenient video appointments or ask about transportation support. [x]   We can get down to business faster if you use MyChart to update information before the visit and submit non-urgent questions before your visit. Thank you for taking the time to provide details through MyChart.  Let our nurse know and she can import this information into your encounter documents.  Arrival and Wait  Times: [x]   Arriving on time ensures that everyone  receives prompt attention. [x]   Early morning (8a) and afternoon (1p) appointments tend to have shortest wait times. [x]   Unfortunately, we cannot delay appointments for late arrivals or hold slots during phone calls.  Getting Answers and Following Up [x]   Simple Questions & Concerns: For quick questions or basic follow-up after your visit, reach us  at [phone] or MyChart messaging. [x]   Complex Concerns: If your concern is more complex, scheduling an appointment might be best. Discuss this with the staff to find the most suitable option. [x]   Lab & Imaging Results: We'll contact you directly if results are abnormal or you don't use MyChart. Most normal results will be on MyChart within 2-3 business days, with a review message from Dr. Jesus. Haven't heard back in 2 weeks? Need results sooner? Contact us  at [phone]. [x]   Referrals: Our referral coordinator will manage specialist referrals. The specialist's office should contact you within 2 weeks to schedule an appointment. Call us  if you haven't heard from them after 2 weeks.  Staying Connected [x]   MyChart: Activate your MyChart for the fastest way to access results and message us . See the last page of this paperwork for instructions on how to activate.  Bring to Your Next Appointment [x]   Medications: Please bring all your medication bottles to your next appointment to ensure we have an accurate record of your prescriptions. [x]   Health Diaries: If you're monitoring any health conditions at home, keeping a diary of your readings can be very helpful for discussions at your next appointment.  Billing [x]   X-ray & Lab Orders: These are billed by separate companies. Contact the invoicing company directly for questions or concerns. [x]   Visit Charges: Discuss any billing inquiries with our administrative services team.  Your Satisfaction Matters [x]   Share Your Experience: We strive for your satisfaction! If you have any  complaints, or preferably compliments, please let Dr. Jesus know directly or contact our Practice Administrators, Manuelita Rubin or [employer], by asking at the front desk.   Reviewing Your Records [x]   Review this early draft of your clinical encounter notes below and the final encounter summary tomorrow on MyChart after its been completed.  All orders placed so far are visible here: Persistent depressive disorder, moderate Assessment & Plan: Depression and anxiety   He experiences chronic depression with anxiety, marked by sudden depressive episodes. Trials with mirtazapine , venlafaxine , and sertraline  have been largely unsuccessful. Currently on sertraline  100 mg, he shows inadequate response and experiences side effects at higher doses. Bipolar depression is considered, but there is no history of mania. Anxiety has improved since stopping mirtazapine . Due to inadequate response, new medication options are being considered. He will be referred to psychiatry for specialized mood disorder management. Aripiprazole 2 mg will be prescribed as needed for sudden depressive episodes, pending insurance approval. A letter of medical necessity will be written for aripiprazole coverage. Potential side effects and benefits of aripiprazole, including its sedative properties that may aid sleep, will be discussed. He is advised to avoid distressing news to help manage mood.  Letter of Medical Necessity  This letter is to document the medical necessity for advanced pharmacologic interventions in a 78 year old male with a diagnosis of major depressive disorder, recurrent, severe, and treatment-resistant, with comorbid anxiety and cognitive impairment. The patient has a longstanding history of depression and anxiety, with multiple failed or partially effective trials of first- and second-line antidepressants, including SSRIs, SNRIs, and  mirtazapine , as well as intolerance to several agents due to adverse effects  (notably, constipation, vivid dreams, and cognitive slowing).  Despite adequate trials of sertraline , venlafaxine , and mirtazapine , the patient continues to experience significant depressive symptoms, poor motivation, and sleep disturbance. He has not achieved sustained remission, meeting the accepted definition of treatment-resistant depression (TRD) as failure to respond to at least two adequate antidepressant trials.[1] This condition is associated with increased morbidity, functional decline, and risk of further medical complications in older adults.[2][1]  Current evidence and expert consensus recommend augmentation strategies as the best-supported pharmacologic approach for TRD in older adults. Randomized controlled trials and systematic reviews demonstrate that augmentation with second-generation antipsychotics (notably aripiprazole and quetiapine) significantly improves remission rates and psychological well-being compared to switching antidepressants or monotherapy, with aripiprazole showing the strongest evidence and favorable tolerability in this age group.[1][3][4][5] Other evidence-based options include augmentation with lithium (with careful monitoring), bupropion, or consideration of novel agents such as esketamine for severe, refractory cases.[6][1][7][2][8]  Given the patient's complex comorbidities, polypharmacy, and history of adverse effects, careful selection and monitoring of augmentation agents is essential. Non-pharmacologic strategies (e.g., psychotherapy, neurostimulation) may be considered adjunctively, but pharmacologic optimization remains the standard of care for TRD in this population.[1][2][8]  In summary, this patient meets criteria for treatment-resistant depression and requires access to evidence-based augmentation strategies and/or novel pharmacologic agents to achieve remission and improve quality of life. Coverage for these interventions is medically necessary and  supported by current clinical guidelines and high-quality evidence.[1][3][7][2][4][5]  If further documentation or clarification is required, please contact our office. References Treatment-Resistant Depression in Older Adults. Steffens DC. The [place] Journal of Medicine. 2024;390(7):630-639. doi:10.1056/NEJMcp2305428. A Review of Therapeutics for Treatment-Resistant Depression in the Older Adult. Blaszczyk AT, Hayward M, Le J. Drugs & Aging. 2023;40(9):785-813. doi:10.1007/s40266-023-01051-3. Antidepressant Augmentation versus Switch in Treatment-Resistant Geriatric Depression. Parthenia NETT, [place], Roose ARIZONA, et al. The [place] Journal of Medicine. 2023;388(12):1067-1079. doi:10.1056/NEJMoa2204462. Pharmacological Interventions for Treatment-Resistant Depression in Adults. Storm SHAUNNA Tonny GORMAN Trudy CJ, et al. The Cochrane Database of Systematic Reviews. 2019;12:CD010557. doi:10.1002/14651858.RI989442.ela7. Management of Depression in Older Adults: A Review. Hiram RM, Reynolds CF. JAMA. 2017;317(20):2114-2122. doi:10.1001/jama.7982.4293. FDA Orange Book. FDA Orange Book. Pharmacological Treatments for Patients With Treatment-Resistant Depression. Ruberto VL, Jha MK, Murrough JW. [employer] ([place], [place]). 2020;13(6):E116. doi:10.3390/ph13060116. Management of Major Depression: Guidelines From the VA/DoD. American Academy of Family Physicians [phone]).   Orders: -     Ambulatory referral to Psychiatry -     ARIPiprazole; Take 1 tablet (2 mg total) by mouth daily.  Dispense: 30 tablet; Refill: 2  Type 2 diabetes mellitus with peripheral angiopathy (HCC) Assessment & Plan: Per patient request checked  Results for orders placed or performed in visit on 04/24/24  POCT HgB A1C   Collection Time: 04/24/24  8:36 AM  Result Value Ref Range   Hemoglobin A1C 6.2 (A) 4.0 - 5.6 %   HbA1c POC (<> result, manual entry)     HbA1c, POC (prediabetic range)     HbA1c, POC (controlled  diabetic range)       Orders: -     POCT glycosylated hemoglobin (Hb A1C)  Benign localized prostatic hyperplasia with lower urinary tract symptoms (LUTS) Assessment & Plan: Lower urinary tract symptoms due to benign prostatic hyperplasia   Nocturia, likely secondary to benign prostatic hyperplasia, contributes to sleep disturbances. There is no current management for prostate issues. He will be referred to urology for evaluation and management of benign prostatic hyperplasia. The possibility of surgical  intervention, such as TURP, to alleviate bladder obstruction and improve sleep will be discussed.  Orders: -     Ambulatory referral to Urology  Primary insomnia Assessment & Plan: He suffers from chronic insomnia with difficulty falling and staying asleep, worsened by nocturia. Mirtazapine  previously improved sleep but was discontinued due to vivid dreams and daytime grouchiness. He currently wakes at 3:30 AM, likely due to nocturia from benign prostatic hyperplasia. CBTi instructions will be provided to improve sleep hygiene. Evening fluid restriction is advised to reduce nocturia. The potential benefit of aripiprazole in improving sleep due to its sedative effects will be discussed.          CBT-I Patient Instructions  Cognitive Behavioral Therapy for Insomnia (CBT-I) is a proven way to help improve sleep by changing habits and thoughts that make it hard to sleep. Here are the main steps you will follow:  1. Sleep Restriction  - Only spend as much time in bed as you actually sleep. For example, if you sleep 6 hours a night, only be in bed for 6 hours.  - This helps your body build up a stronger need for sleep.  - As your sleep improves, you can slowly add more time in bed.  2. Stimulus Control  - Go to bed only when you feel sleepy.  - If you can't fall asleep after about 20 minutes, get out of bed and do something quiet (like reading) until you feel sleepy.  - Use your  bed only for sleep and intimacy--don't watch TV, read, or eat in bed.  - Wake up at the same time every morning, even on weekends.  - Avoid daytime naps.  3. Relaxation Training  - Practice relaxation techniques before bed, such as deep breathing, muscle relaxation, or imagining a peaceful place.  - These help calm your mind and body so you can fall asleep more easily.  4. Cognitive Therapy  - Learn to challenge and change thoughts that make you worry about sleep, such as "I'll never get enough rest."  - Remind yourself that some sleep problems are normal and that worrying can make it harder to sleep.  5. Sleep Hygiene  - Keep your bedroom cool, quiet, and dark.  - Avoid caffeine, alcohol , and heavy meals close to bedtime.  - Get regular exercise, but not right before bed.  - Limit screen time (TV, phone, computer) in the hour before sleep.  Tracking Progress  - Keep a sleep diary to record when you go to bed, when you wake up, and how well you sleep. This helps you and your provider see what's working.  What to Expect  - CBT-I usually takes 4-8 sessions to see results. You may feel a bit more tired at first, but this is normal and will improve as your sleep gets better.  Extra Support  - CBT-I can be done with a trained therapist, or you can use digital programs and apps designed for insomnia.  CBT-I is safe and effective for older adults and can help you sleep better without relying on medications.[1][2][3][4][5][6] References Alternative Treatments to Selected Medications in the 2023 American Geriatrics Society Beers Criteria. Helayne MA. Journal of the [employer]. 2025;73(9):2657-2677. doi:10.1111/jgs.19500. Management of Insomnia. Morin CM, Buysse DJ. The [place] Journal of Medicine. 2024;391(3):247-258. doi:10.1056/NEJMcp2305655. Behavioral and Psychological Treatments for Chronic Insomnia Disorder in Adults: An American Academy of Sleep  Medicine Clinical Practice Guideline. Velva KEELS, Arnedt JT, Bertisch SM, et al. Journal of Clinical Sleep Medicine :  JCSM : Official Publication of the American Academy of Sleep Medicine. 2021;17(2):255-262. doi:10.5664/jcsm.1013. The Management of Chronic Insomnia Disorder and Obstructive Sleep Apnea (Insomnia/OSA) (2025). Liza Lee MD PhD, Beverley Ned MD, Arline Counter PhD DBSM, et al. Department of [facility]. Behavioral and Psychological Treatments for Chronic Insomnia Disorder: Updated Guidelines From the American Academy of Sleep Medicine. American Academy of Family Physicians [phone]). Insomnia. Perlis ML, Posner D, Riemann D, et al. Madalynn Dennison, [place]). 2022;400(10357):1047-1060. doi:10.1016/S0140-6736(22)00879-0.

## 2024-04-24 NOTE — Progress Notes (Signed)
 ==============================  [facility]: [phone]   -- Medical Office Visit --  Patient: Shane Ford      Age: 78 y.o.       Sex:  male  Date:   04/24/2024 Today's Healthcare Provider: Bernardino KANDICE Cone, MD  ==============================   Chief Complaint: Depression (Pt is here for depression medication )  Discussed the use of AI scribe software for clinical note transcription with the patient, who gave verbal consent to proceed.  History of Present Illness 78 year old male with a history of depression and anxiety who presents for management of psychiatric medications.  He has experienced depression and anxiety for nearly 30 years, with symptoms that can change rapidly. Sudden waves of depression and anxiety occur without warning, although these feelings have been lifelong. He has never been hospitalized for suicidality and denies any history of self-harm or hallucinations.  He has been on various medications for depression and anxiety. Previously, he was on mirtazapine , which helped with anxiety but not depression, and caused vivid dreams and daytime irritability. He tried venlafaxine  but discontinued it due to severe constipation. Currently, he is on sertraline  (Zoloft ), which was increased from 50 mg to 100 mg, but he still experiences low motivation and interest in activities. He recalls being on 150 mg of sertraline  in the past, which was effective but slowed his cognitive abilities.  He reports poor sleep, often waking up at 3:30 AM and having difficulty returning to sleep. He has not been on mirtazapine  since June, which previously helped with sleep.  He has a history of a heart attack in April 2024 and is currently under the care of a cardiologist. He is on Plavix and nitroglycerin , with no recent chest pain reported.  Background Reviewed: Problem List: has Status post coronary artery bypass grafting; AV heart block; Pacemaker;  PVC's (premature ventricular contractions); Psoriatic arthropathy (HCC); B12 deficiency; CAD (coronary artery disease), native coronary artery; Cervical disc disease; Combined hyperlipidemia; Kidney stones; Major depressive disorder, recurrent, mild; Type 2 diabetes mellitus with peripheral angiopathy (HCC); Primary insomnia; Benign localized prostatic hyperplasia with lower urinary tract symptoms (LUTS); and Persistent depressive disorder, moderate on their problem list. Past Medical History:  has a past medical history of Acute inferior myocardial infarction (HCC) (10/22/2022), Acute myocardial infarction [facility]), Arthritis, Chest pain at rest (01/17/2021), Coronary artery disease, Depression, Diabetes mellitus (HCC), Dysrhythmia (01/17/2021), Hyperlipidemia, Hypertension, and ST elevation myocardial infarction (STEMI) (HCC). Past Surgical History:   has a past surgical history that includes LEFT HEART CATH AND CORONARY ANGIOGRAPHY (N/A, 01/17/2021); Coronary artery bypass graft (N/A, 01/19/2021); TEE without cardioversion (N/A, 01/19/2021); Endoharvest vein of greater saphenous vein (Right, 01/19/2021); Cardiac catheterization; Eye surgery (Bilateral, 2021); Anterior cervical decomp/discectomy fusion (N/A, 05/12/2021); PACEMAKER IMPLANT (N/A, 07/28/2021); Coronary/Graft Acute MI Revascularization (N/A, 10/22/2022); and LEFT HEART CATH AND CORS/GRAFTS ANGIOGRAPHY (N/A, 10/22/2022). Social History:   reports that he has never smoked. He has never used smokeless tobacco. He reports that he does not drink alcohol  and does not use drugs. Family History:  family history is not on file. Allergies:  has no known allergies.   Medication Reconciliation: Current Outpatient Medications on File Prior to Visit  Medication Sig   acetaminophen  (TYLENOL ) 500 MG tablet Take 1,000 mg by mouth every 6 (six) hours as needed for moderate pain.   atorvastatin  (LIPITOR ) 40 MG tablet Take 40 mg by mouth daily.   atorvastatin   (LIPITOR ) 40 MG tablet Take 1 tablet (40 mg total) by  mouth daily.   Cholecalciferol  50 MCG (2000 UT) CAPS Take 1 capsule (2,000 Units total) by mouth in the morning.   clopidogrel (PLAVIX) 75 MG tablet Take 1 tablet (75 mg total) by mouth daily.   cyanocobalamin  (VITAMIN B12) 1000 MCG tablet Take 1 tablet (1,000 mcg total) by mouth daily.   cyanocobalamin  1000 MCG tablet Take 1 tablet (1,000 mcg total) by mouth daily.   docusate sodium  (COLACE) 100 MG capsule Take 1 capsule (100 mg total) by mouth daily as needed for mild constipation.   donepezil  (ARICEPT ) 10 MG tablet Take 1 tablet (10 mg total) by mouth at bedtime.   empagliflozin  (JARDIANCE ) 10 MG TABS tablet Take 1 tablet (10 mg total) by mouth daily before breakfast.   fenofibrate  160 MG tablet Take 1 tablet (160 mg total) by mouth in the morning.   folic acid  (FOLVITE ) 1 MG tablet Take 1 mg by mouth.   glimepiride  (AMARYL ) 4 MG tablet Take 1 tablet (4 mg total) by mouth daily with breakfast.   ipratropium (ATROVENT) 0.03 % nasal spray Place 2 sprays into both nostrils 2 (two) times daily.   metFORMIN  (GLUCOPHAGE ) 500 MG tablet Take 1 tablet (500 mg total) by mouth 2 (two) times daily with a meal.   methotrexate  (RHEUMATREX) 2.5 MG tablet Take 12.5 mg by mouth every Wednesday. Take 6 tablets (12.5mg ) once a week on Wednesdays. Caution:Chemotherapy. Protect from light.   nitroGLYCERIN  (NITROSTAT ) 0.4 MG SL tablet Place 1 tablet (0.4 mg total) under the tongue every 5 (five) minutes as needed for chest pain.   Polyethyl Glycol-Propyl Glycol (SYSTANE) 0.4-0.3 % SOLN Apply 1 drop to eye 2 (two) times daily as needed.   sertraline  (ZOLOFT ) 100 MG tablet Take 100 mg by mouth daily.   donepezil  (ARICEPT ) 10 MG tablet Take 1 tablet (10 mg total) by mouth at bedtime.   glimepiride  (AMARYL ) 4 MG tablet Take 1 tablet (4 mg total) by mouth daily with breakfast. (Patient not taking: Reported on 04/24/2024)   mirtazapine  (REMERON ) 15 MG tablet Take 1  tablet (15 mg total) by mouth at bedtime.   nitroGLYCERIN  (NITROSTAT ) 0.4 MG SL tablet Place 1 tablet (0.4 mg total) under the tongue every 5 (five) minutes x 3 doses as needed for chest pain. (Patient not taking: Reported on 04/24/2024)   ticagrelor  (BRILINTA ) 90 MG TABS tablet Take 1 tablet (90 mg total) by mouth 2 (two) times daily.   No current facility-administered medications on file prior to visit.  There are no discontinued medications.   Physical Exam:    04/24/2024    8:22 AM 04/03/2024   10:54 AM 03/29/2024    9:55 AM  Vitals with BMI  Height 6' 1 6' 1 6' 2  Weight 175 lbs 6 oz 171 lbs 171 lbs 6 oz  BMI 23.15 22.57 22  Systolic 120  127  Diastolic 62  74  Pulse 72  60  Vital signs reviewed.  Nursing notes reviewed. Weight trend reviewed. Physical Activity: Sufficiently Active (04/23/2024)   Exercise Vital Sign    Days of Exercise per Week: 6 days    Minutes of Exercise per Session: 40 min   General Appearance:  No acute distress appreciable.   Well-groomed, healthy-appearing male.  Well proportioned with no abnormal fat distribution.  Good muscle tone. Pulmonary:  Normal work of breathing at rest, no respiratory distress apparent. SpO2: 98 %  Musculoskeletal: All extremities are intact.  Neurological:  Awake, alert, oriented, and engaged.  No obvious focal  neurological deficits or cognitive impairments.  Sensorium seems unclouded.   Speech is clear and coherent with logical content. Psychiatric:  Appropriate mood, pleasant and cooperative demeanor, thoughtful and engaged during the exam    04/03/2024   10:58 AM 02/16/2024    8:55 AM 01/09/2023    5:15 PM 11/03/2022   12:24 PM  PHQ 2/9 Scores  PHQ - 2 Score 1 1 2 1   PHQ- 9 Score 1 1 3 3     Office Visit on 04/24/2024  Component Date Value Ref Range Status   Hemoglobin A1C 04/24/2024 6.2 (A)  4.0 - 5.6 % Final  Clinical Support on 01/25/2024  Component Date Value Ref Range Status   Date Time Interrogation Session  01/25/2024 [id number]   Final   Pulse Generator Manufacturer 01/25/2024 MERM   Final   Pulse Gen Model 01/25/2024 [id number] Azure XT DR MRI   Final   Pulse Gen Serial Number 01/25/2024 [id number]   Final   Clinic Name 01/25/2024 [facility]   Final   Implantable Pulse Generator Type 01/25/2024 Implantable Pulse Generator   Final   Implantable Pulse Generator Implan* 01/25/2024 [id number]   Final   Implantable Lead Manufacturer 01/25/2024 MERM   Final   Implantable Lead Model 01/25/2024 3830 SelectSecure MRI SureScan   Final   Implantable Lead Serial Number 01/25/2024 [id number]   Final   Implantable Lead Implant Date 01/25/2024 [id number]   Final   Implantable Lead Location Detail 1 01/25/2024 UNKNOWN   Final   Implantable Lead Special Function 01/25/2024 LBBB   Final   Implantable Lead Location 01/25/2024 246139   Final   Implantable Lead Connection Status 01/25/2024 246014   Final   Implantable Lead Manufacturer 01/25/2024 MERM   Final   Implantable Lead Model 01/25/2024 5076 CapSureFix Novus MRI SureScan   Final   Implantable Lead Serial Number 01/25/2024 [id number]   Final   Implantable Lead Implant Date 01/25/2024 [id number]   Final   Implantable Lead Location Detail 1 01/25/2024 APPENDAGE   Final   Implantable Lead Location 01/25/2024 246140   Final   Implantable Lead Connection Status 01/25/2024 246014   Final   Lead Channel Setting Sensing Sensi* 01/25/2024 0.9  mV Final   Lead Channel Setting Pacing Amplit* 01/25/2024 1.5  V Final   Lead Channel Setting Pacing Pulse * 01/25/2024 0.4  ms Final   Lead Channel Setting Pacing Amplit* 01/25/2024 2  V Final   Zone Setting Status 01/25/2024 755011   Final   Zone Setting Status 01/25/2024 755011   Final   Lead Channel Impedance Value 01/25/2024 342  ohm Final   Lead Channel Impedance Value 01/25/2024 266  ohm Final   Lead Channel Sensing Intrinsic Amp* 01/25/2024 3.75  mV Final   Lead Channel Sensing Intrinsic Amp* 01/25/2024 3.75  mV  Final   Lead Channel Pacing Threshold Ampl* 01/25/2024 0.5  V Final   Lead Channel Pacing Threshold Puls* 01/25/2024 0.4  ms Final   Lead Channel Impedance Value 01/25/2024 494  ohm Final   Lead Channel Impedance Value 01/25/2024 361  ohm Final   Lead Channel Sensing Intrinsic Amp* 01/25/2024 20.125  mV Final   Lead Channel Sensing Intrinsic Amp* 01/25/2024 20.125  mV Final   Lead Channel Pacing Threshold Ampl* 01/25/2024 1  V Final   Lead Channel Pacing Threshold Puls* 01/25/2024 0.4  ms Final   Battery Status 01/25/2024 OK   Final   Battery Remaining Longevity 01/25/2024 122  mo Final   Battery Voltage  01/25/2024 3.01  V Final   Brady Statistic RA Percent Paced 01/25/2024 44.39  % Final   Brady Statistic RV Percent Paced 01/25/2024 99.69  % Final   Brady Statistic AP VP Percent 01/25/2024 44.41  % Final   Brady Statistic AS VP Percent 01/25/2024 55.28  % Final   Brady Statistic AP VS Percent 01/25/2024 0.02  % Final   Brady Statistic AS VS Percent 01/25/2024 0.3  % Final  Clinical Support on 10/26/2023  Component Date Value Ref Range Status   Date Time Interrogation Session 10/26/2023 [id number]   Final   Pulse Generator Manufacturer 10/26/2023 MERM   Final   Pulse Gen Model 10/26/2023 [id number] Azure XT DR MRI   Final   Pulse Gen Serial Number 10/26/2023 [id number]   Final   Clinic Name 10/26/2023 [facility]   Final   Implantable Pulse Generator Type 10/26/2023 Implantable Pulse Generator   Final   Implantable Pulse Generator Implan* 10/26/2023 [id number]   Final   Implantable Lead Manufacturer 10/26/2023 MERM   Final   Implantable Lead Model 10/26/2023 3830 SelectSecure MRI SureScan   Final   Implantable Lead Serial Number 10/26/2023 [id number]   Final   Implantable Lead Implant Date 10/26/2023 [id number]   Final   Implantable Lead Location Detail 1 10/26/2023 UNKNOWN   Final   Implantable Lead Special Function 10/26/2023 LBBB   Final   Implantable Lead Location 10/26/2023  246139   Final   Implantable Lead Connection Status 10/26/2023 246014   Final   Implantable Lead Manufacturer 10/26/2023 MERM   Final   Implantable Lead Model 10/26/2023 5076 CapSureFix Novus MRI SureScan   Final   Implantable Lead Serial Number 10/26/2023 [id number]   Final   Implantable Lead Implant Date 10/26/2023 [id number]   Final   Implantable Lead Location Detail 1 10/26/2023 APPENDAGE   Final   Implantable Lead Location 10/26/2023 246140   Final   Implantable Lead Connection Status 10/26/2023 246014   Final   Lead Channel Setting Sensing Sensi* 10/26/2023 0.9  mV Final   Lead Channel Setting Pacing Amplit* 10/26/2023 1.5  V Final   Lead Channel Setting Pacing Pulse * 10/26/2023 0.4  ms Final   Lead Channel Setting Pacing Amplit* 10/26/2023 2  V Final   Zone Setting Status 10/26/2023 755011   Final   Zone Setting Status 10/26/2023 244988   Final   Lead Channel Impedance Value 10/26/2023 323  ohm Final   Lead Channel Impedance Value 10/26/2023 266  ohm Final   Lead Channel Sensing Intrinsic Amp* 10/26/2023 4.75  mV Final   Lead Channel Sensing Intrinsic Amp* 10/26/2023 4.75  mV Final   Lead Channel Pacing Threshold Ampl* 10/26/2023 0.625  V Final   Lead Channel Pacing Threshold Puls* 10/26/2023 0.4  ms Final   Lead Channel Impedance Value 10/26/2023 475  ohm Final   Lead Channel Impedance Value 10/26/2023 361  ohm Final   Lead Channel Sensing Intrinsic Amp* 10/26/2023 18.625  mV Final   Lead Channel Sensing Intrinsic Amp* 10/26/2023 18.625  mV Final   Lead Channel Pacing Threshold Ampl* 10/26/2023 1  V Final   Lead Channel Pacing Threshold Puls* 10/26/2023 0.4  ms Final   Battery Status 10/26/2023 OK   Final   Battery Remaining Longevity 10/26/2023 124  mo Final   Battery Voltage 10/26/2023 3.01  V Final   Brady Statistic RA Percent Paced 10/26/2023 35.9  % Final   Brady Statistic RV Percent Paced 10/26/2023 99.66  % Final  [place] Statistic AP VP Percent 10/26/2023 35.86  % Final    Brady Statistic AS VP Percent 10/26/2023 63.8  % Final   Brady Statistic AP VS Percent 10/26/2023 0.03  % Final   Brady Statistic AS VS Percent 10/26/2023 0.32  % Final  Clinical Support on 07/27/2023  Component Date Value Ref Range Status   Date Time Interrogation Session 07/27/2023 [id number]   Final   Pulse Generator Manufacturer 07/27/2023 MERM   Final   Pulse Gen Model 07/27/2023 W1DR01 Azure XT DR MRI   Final   Pulse Gen Serial Number 07/27/2023 [id number]   Final   Clinic Name 07/27/2023 [facility]   Final   Implantable Pulse Generator Type 07/27/2023 Implantable Pulse Generator   Final   Implantable Pulse Generator Implan* 07/27/2023 [id number]   Final   Implantable Lead Manufacturer 07/27/2023 MERM   Final   Implantable Lead Model 07/27/2023 3830 SelectSecure MRI SureScan   Final   Implantable Lead Serial Number 07/27/2023 [id number]   Final   Implantable Lead Implant Date 07/27/2023 [id number]   Final   Implantable Lead Location Detail 1 07/27/2023 UNKNOWN   Final   Implantable Lead Special Function 07/27/2023 LBBB   Final   Implantable Lead Location 07/27/2023 246139   Final   Implantable Lead Connection Status 07/27/2023 246014   Final   Implantable Lead Manufacturer 07/27/2023 MERM   Final   Implantable Lead Model 07/27/2023 5076 CapSureFix Novus MRI SureScan   Final   Implantable Lead Serial Number 07/27/2023 [id number]   Final   Implantable Lead Implant Date 07/27/2023 [id number]   Final   Implantable Lead Location Detail 1 07/27/2023 APPENDAGE   Final   Implantable Lead Location 07/27/2023 246140   Final   Implantable Lead Connection Status 07/27/2023 246014   Final   Lead Channel Setting Sensing Sensi* 07/27/2023 0.9  mV Final   Lead Channel Setting Pacing Amplit* 07/27/2023 1.5  V Final   Lead Channel Setting Pacing Pulse * 07/27/2023 0.4  ms Final   Lead Channel Setting Pacing Amplit* 07/27/2023 2  V Final   Zone Setting Status 07/27/2023 755011   Final   Zone  Setting Status 07/27/2023 244988   Final   Lead Channel Impedance Value 07/27/2023 342  ohm Final   Lead Channel Impedance Value 07/27/2023 266  ohm Final   Lead Channel Sensing Intrinsic Amp* 07/27/2023 3.75  mV Final   Lead Channel Sensing Intrinsic Amp* 07/27/2023 3.75  mV Final   Lead Channel Pacing Threshold Ampl* 07/27/2023 0.625  V Final   Lead Channel Pacing Threshold Puls* 07/27/2023 0.4  ms Final   Lead Channel Impedance Value 07/27/2023 494  ohm Final   Lead Channel Impedance Value 07/27/2023 361  ohm Final   Lead Channel Sensing Intrinsic Amp* 07/27/2023 19.25  mV Final   Lead Channel Sensing Intrinsic Amp* 07/27/2023 19.25  mV Final   Lead Channel Pacing Threshold Ampl* 07/27/2023 1  V Final   Lead Channel Pacing Threshold Puls* 07/27/2023 0.4  ms Final   Battery Status 07/27/2023 OK   Final   Battery Remaining Longevity 07/27/2023 128  mo Final   Battery Voltage 07/27/2023 3.02  V Final   Brady Statistic RA Percent Paced 07/27/2023 26.5  % Final   Brady Statistic RV Percent Paced 07/27/2023 99.64  % Final   Brady Statistic AP VP Percent 07/27/2023 26.44  % Final   Brady Statistic AS VP Percent 07/27/2023 73.2  % Final   Brady Statistic AP VS Percent  07/27/2023 0.03  % Final   Brady Statistic AS VS Percent 07/27/2023 0.33  % Final  Clinical Support on 04/27/2023  Component Date Value Ref Range Status   Date Time Interrogation Session 04/26/2023 [id number]   Final   Pulse Generator Manufacturer 04/26/2023 MERM   Final   Pulse Gen Model 04/26/2023 W1DR01 Azure XT DR MRI   Final   Pulse Gen Serial Number 04/26/2023 [id number]   Final   Clinic Name 04/26/2023 [facility]   Final   Implantable Pulse Generator Type 04/26/2023 Implantable Pulse Generator   Final   Implantable Pulse Generator Implan* 04/26/2023 [id number]   Final   Implantable Lead Manufacturer 04/26/2023 MERM   Final   Implantable Lead Model 04/26/2023 3830 SelectSecure MRI SureScan   Final   Implantable  Lead Serial Number 04/26/2023 [id number]   Final   Implantable Lead Implant Date 04/26/2023 [id number]   Final   Implantable Lead Location Detail 1 04/26/2023 UNKNOWN   Final   Implantable Lead Special Function 04/26/2023 LBBB   Final   Implantable Lead Location 04/26/2023 246139   Final   Implantable Lead Connection Status 04/26/2023 246014   Final   Implantable Lead Manufacturer 04/26/2023 MERM   Final   Implantable Lead Model 04/26/2023 5076 CapSureFix Novus MRI SureScan   Final   Implantable Lead Serial Number 04/26/2023 [id number]   Final   Implantable Lead Implant Date 04/26/2023 [id number]   Final   Implantable Lead Location Detail 1 04/26/2023 APPENDAGE   Final   Implantable Lead Location 04/26/2023 246140   Final   Implantable Lead Connection Status 04/26/2023 246014   Final   Lead Channel Setting Sensing Sensi* 04/26/2023 0.9  mV Final   Lead Channel Setting Pacing Amplit* 04/26/2023 1.5  V Final   Lead Channel Setting Pacing Pulse * 04/26/2023 0.4  ms Final   Lead Channel Setting Pacing Amplit* 04/26/2023 2  V Final   Zone Setting Status 04/26/2023 755011   Final   Zone Setting Status 04/26/2023 244988   Final   Lead Channel Impedance Value 04/26/2023 342  ohm Final   Lead Channel Impedance Value 04/26/2023 266  ohm Final   Lead Channel Sensing Intrinsic Amp* 04/26/2023 4.5  mV Final   Lead Channel Sensing Intrinsic Amp* 04/26/2023 4.5  mV Final   Lead Channel Pacing Threshold Ampl* 04/26/2023 0.625  V Final   Lead Channel Pacing Threshold Puls* 04/26/2023 0.4  ms Final   Lead Channel Impedance Value 04/26/2023 494  ohm Final   Lead Channel Impedance Value 04/26/2023 342  ohm Final   Lead Channel Sensing Intrinsic Amp* 04/26/2023 16.75  mV Final   Lead Channel Sensing Intrinsic Amp* 04/26/2023 16.75  mV Final   Lead Channel Pacing Threshold Ampl* 04/26/2023 1  V Final   Lead Channel Pacing Threshold Puls* 04/26/2023 0.4  ms Final   Battery Status 04/26/2023 OK   Final   Battery  Remaining Longevity 04/26/2023 131  mo Final   Battery Voltage 04/26/2023 3.02  V Final   Brady Statistic RA Percent Paced 04/26/2023 25.96  % Final   Brady Statistic RV Percent Paced 04/26/2023 99.65  % Final   Brady Statistic AP VP Percent 04/26/2023 25.95  % Final   Brady Statistic AS VP Percent 04/26/2023 73.7  % Final   Brady Statistic AP VS Percent 04/26/2023 0.02  % Final   Brady Statistic AS VS Percent 04/26/2023 0.33  % Final  Clinical Support on 01/26/2023  Component Date Value Ref Range Status  Date Time Interrogation Session 01/25/2023 [id number]   Final   Pulse Generator Manufacturer 01/25/2023 MERM   Final   Pulse Gen Model 01/25/2023 [id number] Azure XT DR MRI   Final   Pulse Gen Serial Number 01/25/2023 [id number]   Final   Clinic Name 01/25/2023 [facility]   Final   Implantable Pulse Generator Type 01/25/2023 Implantable Pulse Generator   Final   Implantable Pulse Generator Implan* 01/25/2023 [id number]   Final   Implantable Lead Manufacturer 01/25/2023 MERM   Final   Implantable Lead Model 01/25/2023 3830 SelectSecure MRI SureScan   Final   Implantable Lead Serial Number 01/25/2023 [id number]   Final   Implantable Lead Implant Date 01/25/2023 [id number]   Final   Implantable Lead Location Detail 1 01/25/2023 UNKNOWN   Final   Implantable Lead Special Function 01/25/2023 LBBB   Final   Implantable Lead Location 01/25/2023 246139   Final   Implantable Lead Connection Status 01/25/2023 246014   Final   Implantable Lead Manufacturer 01/25/2023 MERM   Final   Implantable Lead Model 01/25/2023 5076 CapSureFix Novus MRI SureScan   Final   Implantable Lead Serial Number 01/25/2023 [id number]   Final   Implantable Lead Implant Date 01/25/2023 [id number]   Final   Implantable Lead Location Detail 1 01/25/2023 APPENDAGE   Final   Implantable Lead Location 01/25/2023 246140   Final   Implantable Lead Connection Status 01/25/2023 246014   Final   Lead Channel Setting Sensing  Sensi* 01/25/2023 0.9  mV Final   Lead Channel Setting Pacing Amplit* 01/25/2023 1.5  V Final   Lead Channel Setting Pacing Pulse * 01/25/2023 0.4  ms Final   Lead Channel Setting Pacing Amplit* 01/25/2023 2  V Final   Zone Setting Status 01/25/2023 755011   Final   Zone Setting Status 01/25/2023 755011   Final   Lead Channel Impedance Value 01/25/2023 361  ohm Final   Lead Channel Impedance Value 01/25/2023 285  ohm Final   Lead Channel Sensing Intrinsic Amp* 01/25/2023 5.125  mV Final   Lead Channel Sensing Intrinsic Amp* 01/25/2023 5.125  mV Final   Lead Channel Pacing Threshold Ampl* 01/25/2023 0.625  V Final   Lead Channel Pacing Threshold Puls* 01/25/2023 0.4  ms Final   Lead Channel Impedance Value 01/25/2023 513  ohm Final   Lead Channel Impedance Value 01/25/2023 361  ohm Final   Lead Channel Sensing Intrinsic Amp* 01/25/2023 19  mV Final   Lead Channel Sensing Intrinsic Amp* 01/25/2023 19  mV Final   Lead Channel Pacing Threshold Ampl* 01/25/2023 0.875  V Final   Lead Channel Pacing Threshold Puls* 01/25/2023 0.4  ms Final   Battery Status 01/25/2023 OK   Final   Battery Remaining Longevity 01/25/2023 135  mo Final   Battery Voltage 01/25/2023 3.02  V Final   Brady Statistic RA Percent Paced 01/25/2023 34.38  % Final   Brady Statistic RV Percent Paced 01/25/2023 99.89  % Final   Brady Statistic AP VP Percent 01/25/2023 34.42  % Final   Brady Statistic AS VP Percent 01/25/2023 65.46  % Final   Brady Statistic AP VS Percent 01/25/2023 0.01  % Final   Brady Statistic AS VS Percent 01/25/2023 0.11  % Final  Lab on 01/18/2023  Component Date Value Ref Range Status   Glucose 01/18/2023 110 (H)  70 - 99 mg/dL Final   BUN 92/75/7975 20  8 - 27 mg/dL Final   Creatinine, Ser 01/18/2023 1.23  0.76 - 1.27  mg/dL Final   eGFR 92/75/7975 60  >59 mL/min/1.73 Final   BUN/Creatinine Ratio 01/18/2023 16  10 - 24 Final   Sodium 01/18/2023 142  134 - 144 mmol/L Final   Potassium 01/18/2023  4.9  3.5 - 5.2 mmol/L Final   Chloride 01/18/2023 105  96 - 106 mmol/L Final   CO2 01/18/2023 25  20 - 29 mmol/L Final   Calcium  01/18/2023 9.8  8.6 - 10.2 mg/dL Final   Cholesterol, Total 01/18/2023 109  100 - 199 mg/dL Final   Triglycerides 92/75/7975 82  0 - 149 mg/dL Final   HDL 92/75/7975 41  >39 mg/dL Final   VLDL Cholesterol Cal 01/18/2023 16  5 - 40 mg/dL Final   LDL Chol Calc (NIH) 01/18/2023 52  0 - 99 mg/dL Final   Chol/HDL Ratio 01/18/2023 2.7  0.0 - 5.0 ratio Final   WBC 01/18/2023 4.8  3.4 - 10.8 x10E3/uL Final   RBC 01/18/2023 4.90  4.14 - 5.80 x10E6/uL Final   Hemoglobin 01/18/2023 14.4  13.0 - 17.7 g/dL Final   Hematocrit 92/75/7975 44.3  37.5 - 51.0 % Final   MCV 01/18/2023 90  79 - 97 fL Final   MCH 01/18/2023 29.4  26.6 - 33.0 pg Final   MCHC 01/18/2023 32.5  31.5 - 35.7 g/dL Final   RDW 92/75/7975 15.0  11.6 - 15.4 % Final   Platelets 01/18/2023 188  150 - 450 x10E3/uL Final  Hospital Outpatient Visit on 11/18/2022  Component Date Value Ref Range Status   Glucose-Capillary 11/18/2022 110 (H)  70 - 99 mg/dL Final  Hospital Outpatient Visit on 11/16/2022  Component Date Value Ref Range Status   Glucose-Capillary 11/16/2022 154 (H)  70 - 99 mg/dL Final   Glucose-Capillary 11/18/2022 145 (H)  70 - 99 mg/dL Final  Hospital Outpatient Visit on 11/14/2022  Component Date Value Ref Range Status   Glucose-Capillary 11/14/2022 124 (H)  70 - 99 mg/dL Final   Glucose-Capillary 11/14/2022 81  70 - 99 mg/dL Final   Glucose-Capillary 11/14/2022 89  70 - 99 mg/dL Final  There may be more visits with results that are not included.  No image results found. No results found.       ASSESSMENT & PLAN   Assessment & Plan Persistent depressive disorder, moderate Depression and anxiety   He experiences chronic depression with anxiety, marked by sudden depressive episodes. Trials with mirtazapine , venlafaxine , and sertraline  have been largely unsuccessful. Currently on  sertraline  100 mg, he shows inadequate response and experiences side effects at higher doses. Bipolar depression is considered, but there is no history of mania. Anxiety has improved since stopping mirtazapine . Due to inadequate response, new medication options are being considered. He will be referred to psychiatry for specialized mood disorder management. Aripiprazole 2 mg will be prescribed as needed for sudden depressive episodes, pending insurance approval. A letter of medical necessity will be written for aripiprazole coverage. Potential side effects and benefits of aripiprazole, including its sedative properties that may aid sleep, will be discussed. He is advised to avoid distressing news to help manage mood.  Letter of Medical Necessity  This letter is to document the medical necessity for advanced pharmacologic interventions in a 78 year old male with a diagnosis of major depressive disorder, recurrent, severe, and treatment-resistant, with comorbid anxiety and cognitive impairment. The patient has a longstanding history of depression and anxiety, with multiple failed or partially effective trials of first- and second-line antidepressants, including SSRIs, SNRIs, and mirtazapine , as well as  intolerance to several agents due to adverse effects (notably, constipation, vivid dreams, and cognitive slowing).  Despite adequate trials of sertraline , venlafaxine , and mirtazapine , the patient continues to experience significant depressive symptoms, poor motivation, and sleep disturbance. He has not achieved sustained remission, meeting the accepted definition of treatment-resistant depression (TRD) as failure to respond to at least two adequate antidepressant trials.[1] This condition is associated with increased morbidity, functional decline, and risk of further medical complications in older adults.[2][1]  Current evidence and expert consensus recommend augmentation strategies as the best-supported  pharmacologic approach for TRD in older adults. Randomized controlled trials and systematic reviews demonstrate that augmentation with second-generation antipsychotics (notably aripiprazole and quetiapine) significantly improves remission rates and psychological well-being compared to switching antidepressants or monotherapy, with aripiprazole showing the strongest evidence and favorable tolerability in this age group.[1][3][4][5] Other evidence-based options include augmentation with lithium (with careful monitoring), bupropion, or consideration of novel agents such as esketamine for severe, refractory cases.[6][1][7][2][8]  Given the patient's complex comorbidities, polypharmacy, and history of adverse effects, careful selection and monitoring of augmentation agents is essential. Non-pharmacologic strategies (e.g., psychotherapy, neurostimulation) may be considered adjunctively, but pharmacologic optimization remains the standard of care for TRD in this population.[1][2][8]  In summary, this patient meets criteria for treatment-resistant depression and requires access to evidence-based augmentation strategies and/or novel pharmacologic agents to achieve remission and improve quality of life. Coverage for these interventions is medically necessary and supported by current clinical guidelines and high-quality evidence.[1][3][7][2][4][5]  If further documentation or clarification is required, please contact our office. References Treatment-Resistant Depression in Older Adults. Steffens DC. The New England Journal of Medicine. 2024;390(7):630-639. doi:10.1056/NEJMcp2305428. A Review of Therapeutics for Treatment-Resistant Depression in the Older Adult. Blaszczyk AT, [place]ward M, Le J. Drugs & Aging. 2023;40(9):785-813. doi:10.1007/s40266-023-01051-3. Antidepressant Augmentation versus Switch in Treatment-Resistant Geriatric Depression. Parthenia NETT, [place], [place], et al. The [place] Journal of Medicine.  2023;388(12):1067-1079. doi:10.1056/NEJMoa2204462. Pharmacological Interventions for Treatment-Resistant Depression in Adults. Storm SHAUNNA Tonny GORMAN Trudy CJ, et al. The Cochrane Database of Systematic Reviews. 2019;12:CD010557. doi:10.1002/14651858.RI989442.ela7. Management of Depression in Older Adults: A Review. Hiram RM, Reynolds CF. JAMA. 2017;317(20):2114-2122. doi:10.1001/jama.7982.4293. FDA Orange Book. FDA Orange Book. Pharmacological Treatments for Patients With Treatment-Resistant Depression. Ruberto VL, Jha MK, Murrough JW. [employer] ([place], [place]). 2020;13(6):E116. doi:10.3390/ph13060116. Management of Major Depression: Guidelines From the [facility]. American Academy of Family Physicians [phone]).  Type 2 diabetes mellitus with peripheral angiopathy (HCC) Per patient request checked  Results for orders placed or performed in visit on 04/24/24  POCT HgB A1C   Collection Time: 04/24/24  8:36 AM  Result Value Ref Range   Hemoglobin A1C 6.2 (A) 4.0 - 5.6 %   HbA1c POC (<> result, manual entry)     HbA1c, POC (prediabetic range)     HbA1c, POC (controlled diabetic range)      Benign localized prostatic hyperplasia with lower urinary tract symptoms (LUTS) Lower urinary tract symptoms due to benign prostatic hyperplasia   Nocturia, likely secondary to benign prostatic hyperplasia, contributes to sleep disturbances. There is no current management for prostate issues. He will be referred to urology for evaluation and management of benign prostatic hyperplasia. The possibility of surgical intervention, such as TURP, to alleviate bladder obstruction and improve sleep will be discussed. Primary insomnia He suffers from chronic insomnia with difficulty falling and staying asleep, worsened by nocturia. Mirtazapine  previously improved sleep but was discontinued due to vivid dreams and daytime grouchiness. He currently wakes at 3:30 AM, likely due to nocturia from benign prostatic  hyperplasia. CBTi instructions will be provided to improve sleep hygiene. Evening fluid restriction is advised to reduce nocturia. The potential benefit of aripiprazole in improving sleep due to its sedative effects will be discussed.  ORDER ASSOCIATIONS  #   DIAGNOSIS / CONDITION ICD-10 ENCOUNTER ORDER     ICD-10-CM   1. Persistent depressive disorder, moderate  F34.1 Ambulatory referral to Psychiatry    ARIPiprazole (ABILIFY) 2 MG tablet    2. Type 2 diabetes mellitus with peripheral angiopathy (HCC)  E11.51 POCT HgB A1C    3. Benign localized prostatic hyperplasia with lower urinary tract symptoms (LUTS)  N40.1 Ambulatory referral to Urology    4. Primary insomnia  F51.01            Orders Placed in Encounter:  Lab Orders         POCT HgB A1C    Imaging Orders  No imaging studies ordered today   Referral Orders         Ambulatory referral to Psychiatry         Ambulatory referral to Urology     Meds ordered this encounter  Medications   ARIPiprazole (ABILIFY) 2 MG tablet    Sig: Take 1 tablet (2 mg total) by mouth daily.    Dispense:  30 tablet    Refill:  2    Orders Placed This Encounter  Procedures   Ambulatory referral to Psychiatry    Referral Priority:   Routine    Referral Type:   Psychiatric    Referral Reason:   Specialty Services Required    Requested Specialty:   Psychiatry    Number of Visits Requested:   1   Ambulatory referral to Urology    Referral Priority:   Routine    Referral Reason:   Specialty Services Required    Number of Visits Requested:   1   POCT HgB A1C   ED Discharge Orders          Ordered    POCT HgB A1C        04/24/24 0829    Ambulatory referral to Psychiatry        04/24/24 0909    ARIPiprazole (ABILIFY) 2 MG tablet  Daily        04/24/24 0909    Ambulatory referral to Urology       Comments: [facility]  Urology Consultation Comments  The patient is experiencing bothersome nocturia and lower urinary  tract symptoms, including frequent nighttime awakenings to void and associated sleep disruption. These symptoms have a significant impact on his sleep quality, mood, and overall quality of life. Given his age and comorbidities, nocturia also increases his risk for falls and cognitive impairment.[1][2][3]  Initial management has included lifestyle modifications (fluid restriction in the evening, adjustment of diuretic timing, and sleep hygiene), but symptoms persist. The patient's medication list has been reviewed for contributors, and non-urological causes (including sleep, cardiovascular, renal, and endocrine factors) have been considered per American Geriatrics Society recommendations.[4]  Given the persistence of symptoms, urologic evaluation is requested to further assess for bladder outlet obstruction (e.g., BPH), overactive bladder, or other etiologies. The patient may benefit from additional behavioral therapies (timed voiding, pelvic floor muscle exercises) and consideration of pharmacologic options such as uroselective alpha-1 blockers, 5-alpha reductase inhibitors, or B3 agonists, as recommended for older adults with lower urinary tract symptoms. If overactive bladder is present, B3 agonists are preferred over antimuscarinic agents due to a safer adverse event profile in  older adults.  Specialist input is appreciated to guide further diagnostic workup (including bladder diary, urinalysis, and assessment for nocturnal polyuria or incomplete bladder emptying) and to optimize management, including consideration of minimally invasive procedures if indicated.  Thank you for your assistance in addressing these issues to improve the patient's sleep and quality of life.   04/24/24 9090              This document was synthesized by artificial intelligence (Abridge) using HIPAA-compliant recording of the clinical interaction;   We discussed the use of AI scribe software for clinical note  transcription with the patient, who gave verbal consent to proceed. additional Info: This encounter employed state-of-the-art, real-time, collaborative documentation. The patient actively reviewed and assisted in updating their electronic medical record on a shared screen, ensuring transparency and facilitating joint problem-solving for the problem list, overview, and plan. This approach promotes accurate, informed care. The treatment plan was discussed and reviewed in detail, including medication safety, potential side effects, and all patient questions. We confirmed understanding and comfort with the plan. Follow-up instructions were established, including contacting the office for any concerns, returning if symptoms worsen, persist, or new symptoms develop, and precautions for potential emergency department visits.

## 2024-04-24 NOTE — Assessment & Plan Note (Signed)
 He suffers from chronic insomnia with difficulty falling and staying asleep, worsened by nocturia. Mirtazapine  previously improved sleep but was discontinued due to vivid dreams and daytime grouchiness. He currently wakes at 3:30 AM, likely due to nocturia from benign prostatic hyperplasia. CBTi instructions will be provided to improve sleep hygiene. Evening fluid restriction is advised to reduce nocturia. The potential benefit of aripiprazole in improving sleep due to its sedative effects will be discussed.

## 2024-04-24 NOTE — Telephone Encounter (Signed)
 Pt has appt today

## 2024-04-24 NOTE — Assessment & Plan Note (Signed)
 Per patient request checked  Results for orders placed or performed in visit on 04/24/24  POCT HgB A1C   Collection Time: 04/24/24  8:36 AM  Result Value Ref Range   Hemoglobin A1C 6.2 (A) 4.0 - 5.6 %   HbA1c POC (<> result, manual entry)     HbA1c, POC (prediabetic range)     HbA1c, POC (controlled diabetic range)

## 2024-04-25 ENCOUNTER — Ambulatory Visit

## 2024-04-25 DIAGNOSIS — I442 Atrioventricular block, complete: Secondary | ICD-10-CM | POA: Diagnosis not present

## 2024-04-26 ENCOUNTER — Ambulatory Visit: Payer: Self-pay | Admitting: Internal Medicine

## 2024-04-26 LAB — CUP PACEART REMOTE DEVICE CHECK
Battery Remaining Longevity: 119 mo
Battery Voltage: 3.01 V
Brady Statistic AP VP Percent: 52.17 %
Brady Statistic AP VS Percent: 0.03 %
Brady Statistic AS VP Percent: 47.51 %
Brady Statistic AS VS Percent: 0.29 %
Brady Statistic RA Percent Paced: 52.19 %
Brady Statistic RV Percent Paced: 99.68 %
Date Time Interrogation Session: 20251030043522
Implantable Lead Connection Status: 753985
Implantable Lead Connection Status: 753985
Implantable Lead Implant Date: 20230201
Implantable Lead Implant Date: 20230201
Implantable Lead Location: 753859
Implantable Lead Location: 753860
Implantable Lead Model: 3830
Implantable Lead Model: 5076
Implantable Pulse Generator Implant Date: 20230201
Lead Channel Impedance Value: 266 Ohm
Lead Channel Impedance Value: 342 Ohm
Lead Channel Impedance Value: 361 Ohm
Lead Channel Impedance Value: 513 Ohm
Lead Channel Pacing Threshold Amplitude: 0.5 V
Lead Channel Pacing Threshold Amplitude: 1 V
Lead Channel Pacing Threshold Pulse Width: 0.4 ms
Lead Channel Pacing Threshold Pulse Width: 0.4 ms
Lead Channel Sensing Intrinsic Amplitude: 21.5 mV
Lead Channel Sensing Intrinsic Amplitude: 21.5 mV
Lead Channel Sensing Intrinsic Amplitude: 3.5 mV
Lead Channel Sensing Intrinsic Amplitude: 3.5 mV
Lead Channel Setting Pacing Amplitude: 1.5 V
Lead Channel Setting Pacing Amplitude: 2 V
Lead Channel Setting Pacing Pulse Width: 0.4 ms
Lead Channel Setting Sensing Sensitivity: 0.9 mV
Zone Setting Status: 755011
Zone Setting Status: 755011

## 2024-04-30 ENCOUNTER — Telehealth: Payer: Self-pay | Admitting: Internal Medicine

## 2024-04-30 NOTE — Telephone Encounter (Signed)
 Please look into the following billing question below:   Has patient reached out to billing?  Yes  If no, please advise patient to reach out to the billing department at [phone].  If yes, what did billing advise the patient?  To f/u with PCP office  Is the patient calling in regard to a [employer] or [employer] systems?  no If yes, please have patient reach out to [employer] or [employer] located on their bill received.   If patient was advised by [employer] or [facility] to reach out to the office, what did their billing department advise? Please have patient send copy of bill to the office.   Patient Name: Shane Ford FMW:[id number] [place] ID:  [id number] DOB:1946-01-25 Date of Service: 04/03/24   Amount:  $328  Additional Comments:  Visit was AWV w/ [place]. Patient has [employer] insurance.  Please advise patient that we do not handle billing in the practice.  We will submit this concern to billing leadership.  The patient will be followed up with either by Billing leadership or Billing Customer Service.

## 2024-05-01 NOTE — Progress Notes (Signed)
 Remote PPM Transmission

## 2024-05-02 ENCOUNTER — Ambulatory Visit: Payer: Self-pay | Admitting: Internal Medicine

## 2024-05-02 NOTE — Telephone Encounter (Signed)
 Sending email for coding review.

## 2024-05-09 ENCOUNTER — Other Ambulatory Visit: Payer: Self-pay | Admitting: Internal Medicine

## 2024-05-09 ENCOUNTER — Other Ambulatory Visit (HOSPITAL_COMMUNITY): Payer: Self-pay

## 2024-05-09 DIAGNOSIS — F330 Major depressive disorder, recurrent, mild: Secondary | ICD-10-CM

## 2024-05-09 MED ORDER — SERTRALINE HCL 50 MG PO TABS
50.0000 mg | ORAL_TABLET | Freq: Every day | ORAL | 4 refills | Status: DC
  Filled 2024-05-09: qty 90, 90d supply, fill #0

## 2024-05-09 MED ORDER — SERTRALINE HCL 100 MG PO TABS
100.0000 mg | ORAL_TABLET | Freq: Every day | ORAL | 4 refills | Status: AC
Start: 2024-05-09 — End: 2025-08-02

## 2024-05-09 NOTE — Telephone Encounter (Signed)
 Charge voided and patient notified.
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
## 2024-05-14 ENCOUNTER — Telehealth: Payer: Self-pay | Admitting: Internal Medicine

## 2024-05-14 ENCOUNTER — Ambulatory Visit

## 2024-05-14 ENCOUNTER — Encounter: Payer: Self-pay | Admitting: Physician Assistant

## 2024-05-14 ENCOUNTER — Ambulatory Visit (INDEPENDENT_AMBULATORY_CARE_PROVIDER_SITE_OTHER): Admitting: Physician Assistant

## 2024-05-14 VITALS — BP 137/70 | HR 62 | Resp 20 | Ht 74.0 in | Wt 177.0 lb

## 2024-05-14 DIAGNOSIS — R202 Paresthesia of skin: Secondary | ICD-10-CM

## 2024-05-14 DIAGNOSIS — R413 Other amnesia: Secondary | ICD-10-CM | POA: Insufficient documentation

## 2024-05-14 NOTE — Patient Instructions (Addendum)
 It was a pleasure to see you today at our office.   Recommendations:  Neurocognitive evaluation at our office   MRI of the brain versus CT head (pending on the Pacemaker model) Nerve conduction study   Follow up in 2 months    [email or web address]   RECOMMENDATIONS FOR ALL PATIENTS WITH MEMORY PROBLEMS: 1. Continue to exercise (Recommend 30 minutes of walking everyday, or 3 hours every week) 2. Increase social interactions - continue going to [place] and enjoy social gatherings with friends and family 3. Eat healthy, avoid fried foods and eat more fruits and vegetables 4. Maintain adequate blood pressure, blood sugar, and blood cholesterol level. Reducing the risk of stroke and cardiovascular disease also helps promoting better memory. 5. Avoid stressful situations. Live a simple life and avoid aggravations. Organize your time and prepare for the next day in anticipation. 6. Sleep well, avoid any interruptions of sleep and avoid any distractions in the bedroom that may interfere with adequate sleep quality 7. Avoid sugar, avoid sweets as there is a strong link between excessive sugar intake, diabetes, and cognitive impairment We discussed the Mediterranean diet, which has been shown to help patients reduce the risk of progressive memory disorders and reduces cardiovascular risk. This includes eating fish, eat fruits and green leafy vegetables, nuts like almonds and hazelnuts, walnuts, and also use olive oil. Avoid fast foods and fried foods as much as possible. Avoid sweets and sugar as sugar use has been linked to worsening of memory function.  There is always a concern of gradual progression of memory problems. If this is the case, then we may need to adjust level of care according to patient needs. Support, both to the patient and caregiver, should then be put into place.      You have been referred for a neuropsychological evaluation  (i.e., evaluation of memory and thinking abilities). Please bring someone with you to this appointment if possible, as it is helpful for the doctor to hear from both you and another adult who knows you well. Please bring eyeglasses and hearing aids if you wear them.    The evaluation will take approximately 3 hours and has two parts:   The first part is a clinical interview with the neuropsychologist (Dr. Richie or Dr. Gayland). During the interview, the neuropsychologist will speak with you and the individual you brought to the appointment.    The second part of the evaluation is testing with the doctor's technician Neal or Luke). During the testing, the technician will ask you to remember different types of material, solve problems, and answer some questionnaires. Your family member will not be present for this portion of the evaluation.   Please note: We must reserve several hours of the neuropsychologist's time and the psychometrician's time for your evaluation appointment. As such, there is a No-Show fee of $100. If you are unable to attend any of your appointments, please contact our office as soon as possible to reschedule.      DRIVING: Regarding driving, in patients with progressive memory problems, driving will be impaired. We advise to have someone else do the driving if trouble finding directions or if minor accidents are reported. Independent driving assessment is available to determine safety of driving.   If you are interested in the driving assessment, you can contact the following:  [facility] in [place] [phone]  [facility] [phone]  [facility] [phone]  [facility] [phone] or [phone]   FALL PRECAUTIONS: Be  cautious when walking. Scan the area for obstacles that may increase the risk of trips and falls. When getting up in the mornings, sit up at the edge of the bed for a few minutes before getting out  of bed. Consider elevating the bed at the head end to avoid drop of blood pressure when getting up. Walk always in a well-lit room (use night lights in the walls). Avoid area rugs or power cords from appliances in the middle of the walkways. Use a walker or a cane if necessary and consider physical therapy for balance exercise. Get your eyesight checked regularly.  FINANCIAL OVERSIGHT: Supervision, especially oversight when making financial decisions or transactions is also recommended.  HOME SAFETY: Consider the safety of the kitchen when operating appliances like stoves, microwave oven, and blender. Consider having supervision and share cooking responsibilities until no longer able to participate in those. Accidents with firearms and other hazards in the house should be identified and addressed as well.   ABILITY TO BE LEFT ALONE: If patient is unable to contact 911 operator, consider using LifeLine, or when the need is there, arrange for someone to stay with patients. Smoking is a fire hazard, consider supervision or cessation. Risk of wandering should be assessed by caregiver and if detected at any point, supervision and safe proof recommendations should be instituted.  MEDICATION SUPERVISION: Inability to self-administer medication needs to be constantly addressed. Implement a mechanism to ensure safe administration of the medications.      Mediterranean Diet A Mediterranean diet refers to food and lifestyle choices that are based on the traditions of countries located on the [place]. This way of eating has been shown to help prevent certain conditions and improve outcomes for people who have chronic diseases, like kidney disease and heart disease. What are tips for following this plan? Lifestyle  Cook and eat meals together with your family, when possible. Drink enough fluid to keep your urine clear or pale yellow. Be physically active every day. This includes: Aerobic exercise  like running or swimming. Leisure activities like gardening, walking, or housework. Get 7-8 hours of sleep each night. If recommended by your health care provider, drink red wine in moderation. This means 1 glass a day for nonpregnant women and 2 glasses a day for men. A glass of wine equals 5 oz (150 mL). Reading food labels  Check the serving size of packaged foods. For foods such as rice and pasta, the serving size refers to the amount of cooked product, not dry. Check the total fat in packaged foods. Avoid foods that have saturated fat or trans fats. Check the ingredients list for added sugars, such as corn syrup. Shopping  At the grocery store, buy most of your food from the areas near the walls of the store. This includes: Fresh fruits and vegetables (produce). Grains, beans, nuts, and seeds. Some of these may be available in unpackaged forms or large amounts (in bulk). Fresh seafood. Poultry and eggs. Low-fat dairy products. Buy whole ingredients instead of prepackaged foods. Buy fresh fruits and vegetables in-season from local farmers markets. Buy frozen fruits and vegetables in resealable bags. If you do not have access to quality fresh seafood, buy precooked frozen shrimp or canned fish, such as tuna, salmon, or sardines. Buy small amounts of raw or cooked vegetables, salads, or olives from the deli or salad bar at your store. Stock your pantry so you always have certain foods on hand, such as olive oil, canned tuna, canned  tomatoes, rice, pasta, and beans. Cooking  Cook foods with extra-virgin olive oil instead of using butter or other vegetable oils. Have meat as a side dish, and have vegetables or grains as your main dish. This means having meat in small portions or adding small amounts of meat to foods like pasta or stew. Use beans or vegetables instead of meat in common dishes like chili or lasagna. Experiment with different cooking methods. Try roasting or broiling vegetables  instead of steaming or sauteing them. Add frozen vegetables to soups, stews, pasta, or rice. Add nuts or seeds for added healthy fat at each meal. You can add these to yogurt, salads, or vegetable dishes. Marinate fish or vegetables using olive oil, lemon juice, garlic, and fresh herbs. Meal planning  Plan to eat 1 vegetarian meal one day each week. Try to work up to 2 vegetarian meals, if possible. Eat seafood 2 or more times a week. Have healthy snacks readily available, such as: Vegetable sticks with hummus. Greek yogurt. Fruit and nut trail mix. Eat balanced meals throughout the week. This includes: Fruit: 2-3 servings a day Vegetables: 4-5 servings a day Low-fat dairy: 2 servings a day Fish, poultry, or lean meat: 1 serving a day Beans and legumes: 2 or more servings a week Nuts and seeds: 1-2 servings a day Whole grains: 6-8 servings a day Extra-virgin olive oil: 3-4 servings a day Limit red meat and sweets to only a few servings a month What are my food choices? Mediterranean diet Recommended Grains: Whole-grain pasta. Brown rice. Bulgar wheat. Polenta. Couscous. Whole-wheat bread. [place] Made[place]. Vegetables: Artichokes. Beets. Broccoli. Cabbage. Carrots. Eggplant. Green beans. Chard. Kale. Spinach. Onions. Leeks. Peas. Squash. Tomatoes. Peppers. Radishes. Fruits: Apples. Apricots. Avocado. Berries. Bananas. Cherries. Dates. Figs. Grapes. Lemons. Melon. Oranges. Peaches. Plums. Pomegranate. Meats and other protein foods: Beans. Almonds. Sunflower seeds. Pine nuts. Peanuts. Cod. Salmon. Scallops. Shrimp. Tuna. Tilapia. Clams. Oysters. Eggs. Dairy: Low-fat milk. Cheese. Greek yogurt. Beverages: Water. Red wine. Herbal tea. Fats and oils: Extra virgin olive oil. Avocado oil. Grape seed oil. Sweets and desserts: Greek yogurt with honey. Baked apples. Poached pears. Trail mix. Seasoning and other foods: Basil. Cilantro. Coriander. Cumin. Mint. Parsley. Sage. Rosemary. Tarragon.  Garlic. Oregano. Thyme. Pepper. Balsalmic vinegar. Tahini. Hummus. Tomato sauce. Olives. Mushrooms. Limit these Grains: Prepackaged pasta or rice dishes. Prepackaged cereal with added sugar. Vegetables: Deep fried potatoes (french fries). Fruits: Fruit canned in syrup. Meats and other protein foods: Beef. Pork. Lamb. Poultry with skin. Hot dogs. [place]. Dairy: Ice cream. Sour cream. Whole milk. Beverages: Juice. Sugar-sweetened soft drinks. Beer. Liquor and spirits. Fats and oils: Butter. Canola oil. Vegetable oil. Beef fat (tallow). Lard. Sweets and desserts: Cookies. Cakes. Pies. Candy. Seasoning and other foods: Mayonnaise. Premade sauces and marinades. The items listed may not be a complete list. Talk with your dietitian about what dietary choices are right for you. Summary The Mediterranean diet includes both food and lifestyle choices. Eat a variety of fresh fruits and vegetables, beans, nuts, seeds, and whole grains. Limit the amount of red meat and sweets that you eat. Talk with your health care provider about whether it is safe for you to drink red wine in moderation. This means 1 glass a day for nonpregnant women and 2 glasses a day for men. A glass of wine equals 5 oz (150 mL). This information is not intended to replace advice given to you by your health care provider. Make sure you discuss any questions you have with your  health care provider. Document Released: 02/04/2016 Document Revised: 03/08/2016 Document Reviewed: 02/04/2016 Elsevier Interactive Patient Education  2017 [employer].

## 2024-05-14 NOTE — Telephone Encounter (Signed)
 Returned call to Pt.  Advised his device is MRI compatible.  Gave fax # to device clinic if clearance required.

## 2024-05-14 NOTE — Telephone Encounter (Signed)
 Patient says he is having an MRI and he was advised to find out if his PPM is compatible. Please advise.

## 2024-05-15 ENCOUNTER — Other Ambulatory Visit: Payer: Self-pay

## 2024-05-15 DIAGNOSIS — R413 Other amnesia: Secondary | ICD-10-CM

## 2024-05-20 ENCOUNTER — Other Ambulatory Visit (HOSPITAL_COMMUNITY): Payer: Self-pay

## 2024-05-27 ENCOUNTER — Other Ambulatory Visit (HOSPITAL_COMMUNITY): Payer: Self-pay

## 2024-05-28 ENCOUNTER — Ambulatory Visit: Admitting: Internal Medicine

## 2024-05-28 ENCOUNTER — Other Ambulatory Visit (HOSPITAL_COMMUNITY): Payer: Self-pay

## 2024-05-28 ENCOUNTER — Encounter: Payer: Self-pay | Admitting: Internal Medicine

## 2024-05-28 VITALS — BP 126/60 | HR 81 | Temp 98.0°F | Ht 74.0 in | Wt 175.8 lb

## 2024-05-28 DIAGNOSIS — E1151 Type 2 diabetes mellitus with diabetic peripheral angiopathy without gangrene: Secondary | ICD-10-CM

## 2024-05-28 DIAGNOSIS — N401 Enlarged prostate with lower urinary tract symptoms: Secondary | ICD-10-CM | POA: Diagnosis not present

## 2024-05-28 DIAGNOSIS — F5101 Primary insomnia: Secondary | ICD-10-CM | POA: Diagnosis not present

## 2024-05-28 DIAGNOSIS — Z79899 Other long term (current) drug therapy: Secondary | ICD-10-CM

## 2024-05-28 DIAGNOSIS — Z951 Presence of aortocoronary bypass graft: Secondary | ICD-10-CM

## 2024-05-28 DIAGNOSIS — R413 Other amnesia: Secondary | ICD-10-CM | POA: Diagnosis not present

## 2024-05-28 DIAGNOSIS — F341 Dysthymic disorder: Secondary | ICD-10-CM | POA: Diagnosis not present

## 2024-05-28 DIAGNOSIS — F330 Major depressive disorder, recurrent, mild: Secondary | ICD-10-CM | POA: Diagnosis not present

## 2024-05-28 MED ORDER — ATORVASTATIN CALCIUM 40 MG PO TABS
40.0000 mg | ORAL_TABLET | Freq: Every day | ORAL | 3 refills | Status: AC
  Filled 2024-05-28 – 2024-07-12 (×2): qty 90, 90d supply, fill #0

## 2024-05-28 MED ORDER — ARIPIPRAZOLE 2 MG PO TABS
2.0000 mg | ORAL_TABLET | Freq: Every day | ORAL | 4 refills | Status: AC
  Filled 2024-05-28 – 2024-07-12 (×3): qty 90, 90d supply, fill #0

## 2024-05-28 NOTE — Progress Notes (Unsigned)
 ==============================  [facility]: [phone]   -- Medical Office Visit --  Patient: Shane Ford      Age: 78 y.o.       Sex:  male  Date:   05/28/2024 Today's Healthcare Provider: Bernardino KANDICE Cone, MD  ==============================   Chief Complaint: depressive  Discussed the use of AI scribe software for clinical note transcription with the patient, who gave verbal consent to proceed.  History of Present Illness  78 year old male with anxiety disorder and cognitive changes who presents for follow-up on medication management and cognitive concerns.  He experiences cognitive changes, particularly with short-term memory, which he describes as 'a little worse.' He has difficulty recalling recent events and sometimes struggles to find words, although he can eventually recall them with effort. He has developed strategies to help remember names by associating them with 'something silly.' He is currently taking donepezil  and notes that it may interfere with his sleep. An MRI is scheduled for December 23rd, followed by a neuropsychological evaluation to further assess his cognitive function.  He has a history of anxiety disorder, previously managed with Zoloft  and mirtazapine . He currently takes 100 mg of Zoloft , having reduced from 150 mg due to concerns about memory worsening at higher doses. Recently, he has experienced brief 'twinges of anxiety' lasting a few seconds, which began about a week ago. He is concerned about the potential return of his anxiety disorder but does not wish to increase his Zoloft  dose at this time.  He takes Abilify , which initially caused balance issues when taken in the morning, leading him to switch to nighttime dosing. Over time, his balance improved, allowing him to resume morning dosing. He reports that Abilify  has been effective in improving his mood.  He experiences sleep disturbances, requiring mirtazapine   to achieve restful sleep. He has attempted to wean off mirtazapine  but found he could not sleep without it. He plans to try reducing mirtazapine  again now that he has been on Abilify  for a while.  He reports nocturia, needing to urinate two to three times per night, and has had occasional accidents when unable to reach the bathroom in time.  Socially, he stays active by using the computer, organizing household activities, and exercising, including walking. He notes that tasks take longer to complete than before. No chest pain or shortness of breath.  Background Reviewed: Problem List: has Status post coronary artery bypass grafting; AV heart block; Pacemaker; PVC's (premature ventricular contractions); Psoriatic arthropathy (HCC); B12 deficiency; CAD (coronary artery disease), native coronary artery; Cervical disc disease; Combined hyperlipidemia; Kidney stones; Major depressive disorder, recurrent, mild; Type 2 diabetes mellitus with peripheral angiopathy (HCC); Primary insomnia; Benign localized prostatic hyperplasia with lower urinary tract symptoms (LUTS); Persistent depressive disorder, moderate; and Memory impairment on their problem list. Past Medical History:  has a past medical history of Acute inferior myocardial infarction (HCC) (10/22/2022), Acute myocardial infarction [facility]), Arthritis, Chest pain at rest (01/17/2021), Coronary artery disease, Depression, Diabetes mellitus (HCC), Dysrhythmia (01/17/2021), Hyperlipidemia, Hypertension, and ST elevation myocardial infarction (STEMI) (HCC). Past Surgical History:   has a past surgical history that includes LEFT HEART CATH AND CORONARY ANGIOGRAPHY (N/A, 01/17/2021); Coronary artery bypass graft (N/A, 01/19/2021); TEE without cardioversion (N/A, 01/19/2021); Endoharvest vein of greater saphenous vein (Right, 01/19/2021); Cardiac catheterization; Eye surgery (Bilateral, 2021); Anterior cervical decomp/discectomy fusion (N/A, 05/12/2021); PACEMAKER  IMPLANT (N/A, 07/28/2021); Coronary/Graft Acute MI Revascularization (N/A, 10/22/2022); and LEFT HEART CATH AND CORS/GRAFTS ANGIOGRAPHY (N/A, 10/22/2022).  Social History:   reports that he has never smoked. He has never used smokeless tobacco. He reports that he does not drink alcohol  and does not use drugs. Family History:  family history is not on file. Allergies:  has no known allergies.   Medication Reconciliation: Current Outpatient Medications on File Prior to Visit  Medication Sig   acetaminophen  (TYLENOL ) 500 MG tablet Take 1,000 mg by mouth every 6 (six) hours as needed for moderate pain.   atorvastatin  (LIPITOR ) 40 MG tablet Take 40 mg by mouth daily.   Cholecalciferol  50 MCG (2000 UT) CAPS Take 1 capsule (2,000 Units total) by mouth in the morning.   clopidogrel  (PLAVIX ) 75 MG tablet Take 1 tablet (75 mg total) by mouth daily.   cyanocobalamin  (VITAMIN B12) 1000 MCG tablet Take 1 tablet (1,000 mcg total) by mouth daily.   cyanocobalamin  1000 MCG tablet Take 1 tablet (1,000 mcg total) by mouth daily.   docusate sodium  (COLACE) 100 MG capsule Take 1 capsule (100 mg total) by mouth daily as needed for mild constipation.   donepezil  (ARICEPT ) 10 MG tablet Take 1 tablet (10 mg total) by mouth at bedtime.   empagliflozin  (JARDIANCE ) 10 MG TABS tablet Take 1 tablet (10 mg total) by mouth daily before breakfast.   fenofibrate  160 MG tablet Take 1 tablet (160 mg total) by mouth in the morning.   folic acid  (FOLVITE ) 1 MG tablet Take 1,000 mcg by mouth.   glimepiride  (AMARYL ) 4 MG tablet Take 1 tablet (4 mg total) by mouth daily with breakfast.   ipratropium (ATROVENT) 0.03 % nasal spray Place 2 sprays into both nostrils 2 (two) times daily.   metFORMIN  (GLUCOPHAGE ) 500 MG tablet Take 1 tablet (500 mg total) by mouth 2 (two) times daily with a meal.   methotrexate  (RHEUMATREX) 2.5 MG tablet Take 12.5 mg by mouth every Wednesday. Take 6 tablets (12.5mg ) once a week on  Wednesdays. Caution:Chemotherapy. Protect from light.   nitroGLYCERIN  (NITROSTAT ) 0.4 MG SL tablet Place 1 tablet (0.4 mg total) under the tongue every 5 (five) minutes as needed for chest pain.   Polyethyl Glycol-Propyl Glycol (SYSTANE) 0.4-0.3 % SOLN Apply 1 drop to eye 2 (two) times daily as needed.   sertraline  (ZOLOFT ) 100 MG tablet Take 1 tablet (100 mg total) by mouth daily.   No current facility-administered medications on file prior to visit.   Medications Discontinued During This Encounter  Medication Reason   folic acid  (FOLVITE ) 1 MG tablet    atorvastatin  (LIPITOR ) 40 MG tablet Reorder   ARIPiprazole  (ABILIFY ) 2 MG tablet Reorder   donepezil  (ARICEPT ) 10 MG tablet Duplicate     Physical Exam:    05/28/2024    9:59 AM 05/14/2024    7:45 AM 04/24/2024    8:22 AM  Vitals with BMI  Height 6' 2 6' 2 6' 1  Weight 175 lbs 13 oz 177 lbs 175 lbs 6 oz  BMI 22.56 22.72 23.15  Systolic 126 137 879  Diastolic 60 70 62  Pulse 81 62 72  Vital signs reviewed.  Nursing notes reviewed. Weight trend reviewed. Physical Activity: Sufficiently Active (04/23/2024)   Exercise Vital Sign    Days of Exercise per Week: 6 days    Minutes of Exercise per Session: 40 min   General Appearance:  No acute distress appreciable.   Well-groomed, healthy-appearing male.  Well proportioned with no abnormal fat distribution.  Good muscle tone. Pulmonary:  Normal work of breathing at rest, no respiratory distress apparent.  SpO2: 98 %  Musculoskeletal: All extremities are intact.  Neurological:  Awake, alert, oriented, and engaged.  No obvious focal neurological deficits or cognitive impairments.  Sensorium seems unclouded.   Speech is clear and coherent with logical content. Psychiatric:  Appropriate mood, pleasant and cooperative demeanor, thoughtful and engaged during the exam   Verbalized to patient: Physical Exam    Results:   Verbalized to patient: Results      04/03/2024   10:58  AM 02/16/2024    8:55 AM 01/09/2023    5:15 PM 11/03/2022   12:24 PM  PHQ 2/9 Scores  PHQ - 2 Score 1 1 2 1   PHQ- 9 Score 1  1  3  3       Data saved with a previous flowsheet row definition    Clinical Support on 04/25/2024  Component Date Value Ref Range Status   Date Time Interrogation Session 04/25/2024 [id number]   Final   Pulse Generator Manufacturer 04/25/2024 MERM   Final   Pulse Gen Model 04/25/2024 [id number] Azure XT DR MRI   Final   Pulse Gen Serial Number 04/25/2024 [id number]   Final   Clinic Name 04/25/2024 [facility]   Final   Implantable Pulse Generator Type 04/25/2024 Implantable Pulse Generator   Final   Implantable Pulse Generator Implan* 04/25/2024 [id number]   Final   Implantable Lead Manufacturer 04/25/2024 [facility]   Final   Implantable Lead Model 04/25/2024 3830 SelectSecure MRI SureScan   Final   Implantable Lead Serial Number 04/25/2024 [id number]   Final   Implantable Lead Implant Date 04/25/2024 [id number]   Final   Implantable Lead Location Detail 1 04/25/2024 UNKNOWN   Final   Implantable Lead Special Function 04/25/2024 LBBB   Final   Implantable Lead Location 04/25/2024 246139   Final   Implantable Lead Connection Status 04/25/2024 246014   Final   Implantable Lead Manufacturer 04/25/2024 MERM   Final   Implantable Lead Model 04/25/2024 5076 CapSureFix Novus MRI SureScan   Final   Implantable Lead Serial Number 04/25/2024 [id number]   Final   Implantable Lead Implant Date 04/25/2024 [id number]   Final   Implantable Lead Location Detail 1 04/25/2024 APPENDAGE   Final   Implantable Lead Location 04/25/2024 246140   Final   Implantable Lead Connection Status 04/25/2024 246014   Final   Lead Channel Setting Sensing Sensi* 04/25/2024 0.9  mV Final   Lead Channel Setting Pacing Amplit* 04/25/2024 1.5  V Final   Lead Channel Setting Pacing Pulse * 04/25/2024 0.4  ms Final   Lead Channel Setting Pacing Amplit* 04/25/2024 2  V Final   Zone Setting Status 04/25/2024  755011   Final   Zone Setting Status 04/25/2024 244988   Final   Lead Channel Impedance Value 04/25/2024 342  ohm Final   Lead Channel Impedance Value 04/25/2024 266  ohm Final   Lead Channel Sensing Intrinsic Amp* 04/25/2024 3.5  mV Final   Lead Channel Sensing Intrinsic Amp* 04/25/2024 3.5  mV Final   Lead Channel Pacing Threshold Ampl* 04/25/2024 0.5  V Final   Lead Channel Pacing Threshold Puls* 04/25/2024 0.4  ms Final   Lead Channel Impedance Value 04/25/2024 513  ohm Final   Lead Channel Impedance Value 04/25/2024 361  ohm Final   Lead Channel Sensing Intrinsic Amp* 04/25/2024 21.5  mV Final   Lead Channel Sensing Intrinsic Amp* 04/25/2024 21.5  mV Final   Lead Channel Pacing Threshold Ampl* 04/25/2024 1  V Final   Lead Channel  Pacing Threshold Puls* 04/25/2024 0.4  ms Final   Battery Status 04/25/2024 OK   Final   Battery Remaining Longevity 04/25/2024 119  mo Final   Battery Voltage 04/25/2024 3.01  V Final   Brady Statistic RA Percent Paced 04/25/2024 52.19  % Final   Brady Statistic RV Percent Paced 04/25/2024 99.68  % Final   Brady Statistic AP VP Percent 04/25/2024 52.17  % Final   Brady Statistic AS VP Percent 04/25/2024 47.51  % Final   Brady Statistic AP VS Percent 04/25/2024 0.03  % Final   Brady Statistic AS VS Percent 04/25/2024 0.29  % Final  Office Visit on 04/24/2024  Component Date Value Ref Range Status   Hemoglobin A1C 04/24/2024 6.2 (A)  4.0 - 5.6 % Final  Clinical Support on 01/25/2024  Component Date Value Ref Range Status   Date Time Interrogation Session 01/25/2024 [id number]   Final   Pulse Generator Manufacturer 01/25/2024 MERM   Final   Pulse Gen Model 01/25/2024 [id number] Azure XT DR MRI   Final   Pulse Gen Serial Number 01/25/2024 [id number]   Final   Clinic Name 01/25/2024 [facility]   Final   Implantable Pulse Generator Type 01/25/2024 Implantable Pulse Generator   Final   Implantable Pulse Generator Implan* 01/25/2024 [id number]   Final    Implantable Lead Manufacturer 01/25/2024 MERM   Final   Implantable Lead Model 01/25/2024 3830 SelectSecure MRI SureScan   Final   Implantable Lead Serial Number 01/25/2024 [id number]   Final   Implantable Lead Implant Date 01/25/2024 [id number]   Final   Implantable Lead Location Detail 1 01/25/2024 UNKNOWN   Final   Implantable Lead Special Function 01/25/2024 LBBB   Final   Implantable Lead Location 01/25/2024 246139   Final   Implantable Lead Connection Status 01/25/2024 246014   Final   Implantable Lead Manufacturer 01/25/2024 MERM   Final   Implantable Lead Model 01/25/2024 5076 CapSureFix Novus MRI SureScan   Final   Implantable Lead Serial Number 01/25/2024 [id number]   Final   Implantable Lead Implant Date 01/25/2024 [id number]   Final   Implantable Lead Location Detail 1 01/25/2024 APPENDAGE   Final   Implantable Lead Location 01/25/2024 246140   Final   Implantable Lead Connection Status 01/25/2024 246014   Final   Lead Channel Setting Sensing Sensi* 01/25/2024 0.9  mV Final   Lead Channel Setting Pacing Amplit* 01/25/2024 1.5  V Final   Lead Channel Setting Pacing Pulse * 01/25/2024 0.4  ms Final   Lead Channel Setting Pacing Amplit* 01/25/2024 2  V Final   Zone Setting Status 01/25/2024 755011   Final   Zone Setting Status 01/25/2024 244988   Final   Lead Channel Impedance Value 01/25/2024 342  ohm Final   Lead Channel Impedance Value 01/25/2024 266  ohm Final   Lead Channel Sensing Intrinsic Amp* 01/25/2024 3.75  mV Final   Lead Channel Sensing Intrinsic Amp* 01/25/2024 3.75  mV Final   Lead Channel Pacing Threshold Ampl* 01/25/2024 0.5  V Final   Lead Channel Pacing Threshold Puls* 01/25/2024 0.4  ms Final   Lead Channel Impedance Value 01/25/2024 494  ohm Final   Lead Channel Impedance Value 01/25/2024 361  ohm Final   Lead Channel Sensing Intrinsic Amp* 01/25/2024 20.125  mV Final   Lead Channel Sensing Intrinsic Amp* 01/25/2024 20.125  mV Final   Lead Channel Pacing  Threshold Ampl* 01/25/2024 1  V Final   Lead Channel Pacing Threshold Puls* 01/25/2024  0.4  ms Final   Battery Status 01/25/2024 OK   Final   Battery Remaining Longevity 01/25/2024 122  mo Final   Battery Voltage 01/25/2024 3.01  V Final   Brady Statistic RA Percent Paced 01/25/2024 44.39  % Final   Brady Statistic RV Percent Paced 01/25/2024 99.69  % Final   Brady Statistic AP VP Percent 01/25/2024 44.41  % Final   Brady Statistic AS VP Percent 01/25/2024 55.28  % Final   Brady Statistic AP VS Percent 01/25/2024 0.02  % Final   Brady Statistic AS VS Percent 01/25/2024 0.3  % Final  Clinical Support on 10/26/2023  Component Date Value Ref Range Status   Date Time Interrogation Session 10/26/2023 [id number]   Final   Pulse Generator Manufacturer 10/26/2023 MERM   Final   Pulse Gen Model 10/26/2023 W1DR01 Azure XT DR MRI   Final   Pulse Gen Serial Number 10/26/2023 [id number]   Final   Clinic Name 10/26/2023 [facility]   Final   Implantable Pulse Generator Type 10/26/2023 Implantable Pulse Generator   Final   Implantable Pulse Generator Implan* 10/26/2023 [id number]   Final   Implantable Lead Manufacturer 10/26/2023 MERM   Final   Implantable Lead Model 10/26/2023 3830 SelectSecure MRI SureScan   Final   Implantable Lead Serial Number 10/26/2023 [id number]   Final   Implantable Lead Implant Date 10/26/2023 [id number]   Final   Implantable Lead Location Detail 1 10/26/2023 UNKNOWN   Final   Implantable Lead Special Function 10/26/2023 LBBB   Final   Implantable Lead Location 10/26/2023 246139   Final   Implantable Lead Connection Status 10/26/2023 246014   Final   Implantable Lead Manufacturer 10/26/2023 MERM   Final   Implantable Lead Model 10/26/2023 5076 CapSureFix Novus MRI SureScan   Final   Implantable Lead Serial Number 10/26/2023 [id number]   Final   Implantable Lead Implant Date 10/26/2023 [id number]   Final   Implantable Lead Location Detail 1 10/26/2023 APPENDAGE   Final    Implantable Lead Location 10/26/2023 246140   Final   Implantable Lead Connection Status 10/26/2023 246014   Final   Lead Channel Setting Sensing Sensi* 10/26/2023 0.9  mV Final   Lead Channel Setting Pacing Amplit* 10/26/2023 1.5  V Final   Lead Channel Setting Pacing Pulse * 10/26/2023 0.4  ms Final   Lead Channel Setting Pacing Amplit* 10/26/2023 2  V Final   Zone Setting Status 10/26/2023 755011   Final   Zone Setting Status 10/26/2023 244988   Final   Lead Channel Impedance Value 10/26/2023 323  ohm Final   Lead Channel Impedance Value 10/26/2023 266  ohm Final   Lead Channel Sensing Intrinsic Amp* 10/26/2023 4.75  mV Final   Lead Channel Sensing Intrinsic Amp* 10/26/2023 4.75  mV Final   Lead Channel Pacing Threshold Ampl* 10/26/2023 0.625  V Final   Lead Channel Pacing Threshold Puls* 10/26/2023 0.4  ms Final   Lead Channel Impedance Value 10/26/2023 475  ohm Final   Lead Channel Impedance Value 10/26/2023 361  ohm Final   Lead Channel Sensing Intrinsic Amp* 10/26/2023 18.625  mV Final   Lead Channel Sensing Intrinsic Amp* 10/26/2023 18.625  mV Final   Lead Channel Pacing Threshold Ampl* 10/26/2023 1  V Final   Lead Channel Pacing Threshold Puls* 10/26/2023 0.4  ms Final   Battery Status 10/26/2023 OK   Final   Battery Remaining Longevity 10/26/2023 124  mo Final   Battery Voltage 10/26/2023 3.01  V Final   Brady Statistic RA Percent Paced 10/26/2023 35.9  % Final   Brady Statistic RV Percent Paced 10/26/2023 99.66  % Final   Brady Statistic AP VP Percent 10/26/2023 35.86  % Final   Brady Statistic AS VP Percent 10/26/2023 63.8  % Final   Brady Statistic AP VS Percent 10/26/2023 0.03  % Final   Brady Statistic AS VS Percent 10/26/2023 0.32  % Final  Clinical Support on 07/27/2023  Component Date Value Ref Range Status   Date Time Interrogation Session 07/27/2023 [id number]   Final   Pulse Generator Manufacturer 07/27/2023 MERM   Final   Pulse Gen Model 07/27/2023 [id number]  Azure XT DR MRI   Final   Pulse Gen Serial Number 07/27/2023 [id number]   Final   Clinic Name 07/27/2023 [facility]   Final   Implantable Pulse Generator Type 07/27/2023 Implantable Pulse Generator   Final   Implantable Pulse Generator Implan* 07/27/2023 [id number]   Final   Implantable Lead Manufacturer 07/27/2023 MERM   Final   Implantable Lead Model 07/27/2023 3830 SelectSecure MRI SureScan   Final   Implantable Lead Serial Number 07/27/2023 [id number]   Final   Implantable Lead Implant Date 07/27/2023 [id number]   Final   Implantable Lead Location Detail 1 07/27/2023 UNKNOWN   Final   Implantable Lead Special Function 07/27/2023 LBBB   Final   Implantable Lead Location 07/27/2023 246139   Final   Implantable Lead Connection Status 07/27/2023 246014   Final   Implantable Lead Manufacturer 07/27/2023 MERM   Final   Implantable Lead Model 07/27/2023 5076 CapSureFix Novus MRI SureScan   Final   Implantable Lead Serial Number 07/27/2023 [id number]   Final   Implantable Lead Implant Date 07/27/2023 [id number]   Final   Implantable Lead Location Detail 1 07/27/2023 APPENDAGE   Final   Implantable Lead Location 07/27/2023 246140   Final   Implantable Lead Connection Status 07/27/2023 246014   Final   Lead Channel Setting Sensing Sensi* 07/27/2023 0.9  mV Final   Lead Channel Setting Pacing Amplit* 07/27/2023 1.5  V Final   Lead Channel Setting Pacing Pulse * 07/27/2023 0.4  ms Final   Lead Channel Setting Pacing Amplit* 07/27/2023 2  V Final   Zone Setting Status 07/27/2023 755011   Final   Zone Setting Status 07/27/2023 244988   Final   Lead Channel Impedance Value 07/27/2023 342  ohm Final   Lead Channel Impedance Value 07/27/2023 266  ohm Final   Lead Channel Sensing Intrinsic Amp* 07/27/2023 3.75  mV Final   Lead Channel Sensing Intrinsic Amp* 07/27/2023 3.75  mV Final   Lead Channel Pacing Threshold Ampl* 07/27/2023 0.625  V Final   Lead Channel Pacing Threshold Puls* 07/27/2023 0.4  ms  Final   Lead Channel Impedance Value 07/27/2023 494  ohm Final   Lead Channel Impedance Value 07/27/2023 361  ohm Final   Lead Channel Sensing Intrinsic Amp* 07/27/2023 19.25  mV Final   Lead Channel Sensing Intrinsic Amp* 07/27/2023 19.25  mV Final   Lead Channel Pacing Threshold Ampl* 07/27/2023 1  V Final   Lead Channel Pacing Threshold Puls* 07/27/2023 0.4  ms Final   Battery Status 07/27/2023 OK   Final   Battery Remaining Longevity 07/27/2023 128  mo Final   Battery Voltage 07/27/2023 3.02  V Final   Brady Statistic RA Percent Paced 07/27/2023 26.5  % Final   Brady Statistic RV Percent Paced 07/27/2023 99.64  % Final   [place]  Statistic AP VP Percent 07/27/2023 26.44  % Final   Brady Statistic AS VP Percent 07/27/2023 73.2  % Final   Brady Statistic AP VS Percent 07/27/2023 0.03  % Final   Brady Statistic AS VS Percent 07/27/2023 0.33  % Final  Clinical Support on 04/27/2023  Component Date Value Ref Range Status   Date Time Interrogation Session 04/26/2023 [id number]   Final   Pulse Generator Manufacturer 04/26/2023 MERM   Final   Pulse Gen Model 04/26/2023 W1DR01 Azure XT DR MRI   Final   Pulse Gen Serial Number 04/26/2023 [id number]   Final   Clinic Name 04/26/2023 [facility]   Final   Implantable Pulse Generator Type 04/26/2023 Implantable Pulse Generator   Final   Implantable Pulse Generator Implan* 04/26/2023 [id number]   Final   Implantable Lead Manufacturer 04/26/2023 MERM   Final   Implantable Lead Model 04/26/2023 3830 SelectSecure MRI SureScan   Final   Implantable Lead Serial Number 04/26/2023 [id number]   Final   Implantable Lead Implant Date 04/26/2023 [id number]   Final   Implantable Lead Location Detail 1 04/26/2023 UNKNOWN   Final   Implantable Lead Special Function 04/26/2023 LBBB   Final   Implantable Lead Location 04/26/2023 246139   Final   Implantable Lead Connection Status 04/26/2023 246014   Final   Implantable Lead Manufacturer 04/26/2023 MERM   Final    Implantable Lead Model 04/26/2023 5076 CapSureFix Novus MRI SureScan   Final   Implantable Lead Serial Number 04/26/2023 [id number]   Final   Implantable Lead Implant Date 04/26/2023 [id number]   Final   Implantable Lead Location Detail 1 04/26/2023 APPENDAGE   Final   Implantable Lead Location 04/26/2023 246140   Final   Implantable Lead Connection Status 04/26/2023 246014   Final   Lead Channel Setting Sensing Sensi* 04/26/2023 0.9  mV Final   Lead Channel Setting Pacing Amplit* 04/26/2023 1.5  V Final   Lead Channel Setting Pacing Pulse * 04/26/2023 0.4  ms Final   Lead Channel Setting Pacing Amplit* 04/26/2023 2  V Final   Zone Setting Status 04/26/2023 755011   Final   Zone Setting Status 04/26/2023 244988   Final   Lead Channel Impedance Value 04/26/2023 342  ohm Final   Lead Channel Impedance Value 04/26/2023 266  ohm Final   Lead Channel Sensing Intrinsic Amp* 04/26/2023 4.5  mV Final   Lead Channel Sensing Intrinsic Amp* 04/26/2023 4.5  mV Final   Lead Channel Pacing Threshold Ampl* 04/26/2023 0.625  V Final   Lead Channel Pacing Threshold Puls* 04/26/2023 0.4  ms Final   Lead Channel Impedance Value 04/26/2023 494  ohm Final   Lead Channel Impedance Value 04/26/2023 342  ohm Final   Lead Channel Sensing Intrinsic Amp* 04/26/2023 16.75  mV Final   Lead Channel Sensing Intrinsic Amp* 04/26/2023 16.75  mV Final   Lead Channel Pacing Threshold Ampl* 04/26/2023 1  V Final   Lead Channel Pacing Threshold Puls* 04/26/2023 0.4  ms Final   Battery Status 04/26/2023 OK   Final   Battery Remaining Longevity 04/26/2023 131  mo Final   Battery Voltage 04/26/2023 3.02  V Final   Brady Statistic RA Percent Paced 04/26/2023 25.96  % Final   Brady Statistic RV Percent Paced 04/26/2023 99.65  % Final   Brady Statistic AP VP Percent 04/26/2023 25.95  % Final   Brady Statistic AS VP Percent 04/26/2023 73.7  % Final   Brady Statistic AP VS Percent 04/26/2023 0.02  %  Final   Brady Statistic AS  VS Percent 04/26/2023 0.33  % Final  Clinical Support on 01/26/2023  Component Date Value Ref Range Status   Date Time Interrogation Session 01/25/2023 [id number]   Final   Pulse Generator Manufacturer 01/25/2023 MERM   Final   Pulse Gen Model 01/25/2023 W1DR01 Azure XT DR MRI   Final   Pulse Gen Serial Number 01/25/2023 [id number]   Final   Clinic Name 01/25/2023 [facility]   Final   Implantable Pulse Generator Type 01/25/2023 Implantable Pulse Generator   Final   Implantable Pulse Generator Implan* 01/25/2023 [id number]   Final   Implantable Lead Manufacturer 01/25/2023 MERM   Final   Implantable Lead Model 01/25/2023 3830 SelectSecure MRI SureScan   Final   Implantable Lead Serial Number 01/25/2023 [id number]   Final   Implantable Lead Implant Date 01/25/2023 [id number]   Final   Implantable Lead Location Detail 1 01/25/2023 UNKNOWN   Final   Implantable Lead Special Function 01/25/2023 LBBB   Final   Implantable Lead Location 01/25/2023 246139   Final   Implantable Lead Connection Status 01/25/2023 246014   Final   Implantable Lead Manufacturer 01/25/2023 MERM   Final   Implantable Lead Model 01/25/2023 [id number] CapSureFix Novus MRI SureScan   Final   Implantable Lead Serial Number 01/25/2023 [id number]   Final   Implantable Lead Implant Date 01/25/2023 [id number]   Final   Implantable Lead Location Detail 1 01/25/2023 APPENDAGE   Final   Implantable Lead Location 01/25/2023 246140   Final   Implantable Lead Connection Status 01/25/2023 246014   Final   Lead Channel Setting Sensing Sensi* 01/25/2023 0.9  mV Final   Lead Channel Setting Pacing Amplit* 01/25/2023 1.5  V Final   Lead Channel Setting Pacing Pulse * 01/25/2023 0.4  ms Final   Lead Channel Setting Pacing Amplit* 01/25/2023 2  V Final   Zone Setting Status 01/25/2023 755011   Final   Zone Setting Status 01/25/2023 755011   Final   Lead Channel Impedance Value 01/25/2023 361  ohm Final   Lead Channel Impedance Value  01/25/2023 285  ohm Final   Lead Channel Sensing Intrinsic Amp* 01/25/2023 5.125  mV Final   Lead Channel Sensing Intrinsic Amp* 01/25/2023 5.125  mV Final   Lead Channel Pacing Threshold Ampl* 01/25/2023 0.625  V Final   Lead Channel Pacing Threshold Puls* 01/25/2023 0.4  ms Final   Lead Channel Impedance Value 01/25/2023 513  ohm Final   Lead Channel Impedance Value 01/25/2023 361  ohm Final   Lead Channel Sensing Intrinsic Amp* 01/25/2023 19  mV Final   Lead Channel Sensing Intrinsic Amp* 01/25/2023 19  mV Final   Lead Channel Pacing Threshold Ampl* 01/25/2023 0.875  V Final   Lead Channel Pacing Threshold Puls* 01/25/2023 0.4  ms Final   Battery Status 01/25/2023 OK   Final   Battery Remaining Longevity 01/25/2023 135  mo Final   Battery Voltage 01/25/2023 3.02  V Final   Brady Statistic RA Percent Paced 01/25/2023 34.38  % Final   Brady Statistic RV Percent Paced 01/25/2023 99.89  % Final   Brady Statistic AP VP Percent 01/25/2023 34.42  % Final   Brady Statistic AS VP Percent 01/25/2023 65.46  % Final   Brady Statistic AP VS Percent 01/25/2023 0.01  % Final   Brady Statistic AS VS Percent 01/25/2023 0.11  % Final  Lab on 01/18/2023  Component Date Value Ref Range Status   Glucose 01/18/2023 110 (  H)  70 - 99 mg/dL Final   BUN 92/75/7975 20  8 - 27 mg/dL Final   Creatinine, Ser 01/18/2023 1.23  0.76 - 1.27 mg/dL Final   eGFR 92/75/7975 60  >59 mL/min/1.73 Final   BUN/Creatinine Ratio 01/18/2023 16  10 - 24 Final   Sodium 01/18/2023 142  134 - 144 mmol/L Final   Potassium 01/18/2023 4.9  3.5 - 5.2 mmol/L Final   Chloride 01/18/2023 105  96 - 106 mmol/L Final   CO2 01/18/2023 25  20 - 29 mmol/L Final   Calcium  01/18/2023 9.8  8.6 - 10.2 mg/dL Final   Cholesterol, Total 01/18/2023 109  100 - 199 mg/dL Final   Triglycerides 92/75/7975 82  0 - 149 mg/dL Final   HDL 92/75/7975 41  >39 mg/dL Final   VLDL Cholesterol Cal 01/18/2023 16  5 - 40 mg/dL Final   LDL Chol Calc (NIH)  01/18/2023 52  0 - 99 mg/dL Final   Chol/HDL Ratio 01/18/2023 2.7  0.0 - 5.0 ratio Final   WBC 01/18/2023 4.8  3.4 - 10.8 x10E3/uL Final   RBC 01/18/2023 4.90  4.14 - 5.80 x10E6/uL Final   Hemoglobin 01/18/2023 14.4  13.0 - 17.7 g/dL Final   Hematocrit 92/75/7975 44.3  37.5 - 51.0 % Final   MCV 01/18/2023 90  79 - 97 fL Final   MCH 01/18/2023 29.4  26.6 - 33.0 pg Final   MCHC 01/18/2023 32.5  31.5 - 35.7 g/dL Final   RDW 92/75/7975 15.0  11.6 - 15.4 % Final   Platelets 01/18/2023 188  150 - 450 x10E3/uL Final  Hospital Outpatient Visit on 11/18/2022  Component Date Value Ref Range Status   Glucose-Capillary 11/18/2022 110 (H)  70 - 99 mg/dL Final  Hospital Outpatient Visit on 11/16/2022  Component Date Value Ref Range Status   Glucose-Capillary 11/16/2022 154 (H)  70 - 99 mg/dL Final   Glucose-Capillary 11/18/2022 145 (H)  70 - 99 mg/dL Final  There may be more visits with results that are not included.  No image results found. CUP PACEART REMOTE DEVICE CHECK Result Date: 04/26/2024 PPM Scheduled remote reviewed. Normal device function.  Presenting rhythm:  AS/VP Next remote transmission per protocol. LA, CVRS        ASSESSMENT & PLAN   Assessment & Plan Major depressive disorder, recurrent, mild Persistent depressive disorder, moderate Persistent depressive disorder and depression   Abilify  effectively improves mood. Anxiety twinges are present but not bothersome. He prefers to maintain the current Zoloft  dose of 100 mg despite considering an increase to 125 mg. Continue Abilify  and Zoloft  at current doses. Await neuropsychological evaluation results to guide further therapy. Type 2 diabetes mellitus with peripheral angiopathy (HCC) Status post coronary artery bypass grafting Lab Results  Component Value Date   HGBA1C 6.2 (A) 04/24/2024  Well-controlled  Current Outpatient Medications (Endocrine & Metabolic):    empagliflozin  (JARDIANCE ) 10 MG TABS tablet, Take 1 tablet (10  mg total) by mouth daily before breakfast.   glimepiride  (AMARYL ) 4 MG tablet, Take 1 tablet (4 mg total) by mouth daily with breakfast.   metFORMIN  (GLUCOPHAGE ) 500 MG tablet, Take 1 tablet (500 mg total) by mouth 2 (two) times daily with a meal.  Current Outpatient Medications (Cardiovascular):    atorvastatin  (LIPITOR ) 40 MG tablet, Take 40 mg by mouth daily.   atorvastatin  (LIPITOR ) 40 MG tablet, Take 1 tablet (40 mg total) by mouth daily.   fenofibrate  160 MG tablet, Take 1 tablet (160 mg total)  by mouth in the morning.   nitroGLYCERIN  (NITROSTAT ) 0.4 MG SL tablet, Place 1 tablet (0.4 mg total) under the tongue every 5 (five) minutes as needed for chest pain. Primary insomnia Insomnia persists but is managed with mirtazapine . He is considering weaning off mirtazapine  due to long-term use. Continue mirtazapine  for sleep management and consider weaning if sleep remains stable. Memory impairment Cognitive impairment   very mild, not obvious on exam. Short-term memory issues are present. Donepezil  is used to aid memory. A neuropsychological evaluation is scheduled to assess cognitive function and guide further therapy. Continue donepezil  10 mg at bedtime and await evaluation results. Benign localized prostatic hyperplasia with lower urinary tract symptoms (LUTS) Urinary frequency and incontinence   Experiencing nocturia 2-3 times per night and occasional incontinence. A referral to a bladder specialist is planned for further evaluation and management. Polypharmacy Will look for opportunities to reduce medication(s) burden but he is doing well with current. General Health Maintenance   He remains active, engaging in computer activities, organizing, and exercising. He is considering changing dermatologists. Continue current lifestyle activities and proceed with dermatologist change if desired.    ORDER ASSOCIATIONS  #   DIAGNOSIS / CONDITION ICD-10 ENCOUNTER ORDER     ICD-10-CM   1. Major  depressive disorder, recurrent, mild  F33.0     2. Type 2 diabetes mellitus with peripheral angiopathy (HCC)  E11.51     3. Persistent depressive disorder, moderate  F34.1 ARIPiprazole  (ABILIFY ) 2 MG tablet    4. Status post coronary artery bypass grafting  Z95.1 atorvastatin  (LIPITOR ) 40 MG tablet    5. Primary insomnia  F51.01     6. Memory impairment  R41.3     7. Benign localized prostatic hyperplasia with lower urinary tract symptoms (LUTS)  N40.1     8. Polypharmacy  Z79.899       Meds ordered this encounter  Medications   ARIPiprazole  (ABILIFY ) 2 MG tablet    Sig: Take 1 tablet (2 mg total) by mouth daily.    Dispense:  90 tablet    Refill:  4   atorvastatin  (LIPITOR ) 40 MG tablet    Sig: Take 1 tablet (40 mg total) by mouth daily.    Dispense:  90 tablet    Refill:  3   On the day of the visit, I dedicated 34 minutes to both direct and indirect patient care activities.  The time was spent: History: I obtained, documented, and reviewed a thorough medical history. I reviewed the patient's reported symptoms and clarified their context and significance in relation to the current visit. Examination: I conducted a medically appropriate physical evaluation. Communication: I communicated clinical status and plan to the patient and/or family/caregiver. Counseling & Education: I provided personalized counseling on condition and treatment. Documentation: Documenting clinical findings and medical decision-making, and creating and providing documentation for patient review. Treatment Plan: I worked collaboratively with the patient to formulate and communicate an individualized plan (including shared decision-making). Orders: I placed necessary orders (medications, labs, imaging, referrals) in the EMR. Referrals and Communication: I referred the patient to other health care professionals as needed and communicated with them to ensure coordinated care.  This time was spent independently  of any separately billable procedures. Please note that this statement is intended to provide a clear and comprehensive account of the time and services provided during the patient's visit.  The extended time spent was necessary to provide safe, effective, and comprehensive care due to the following factors:, Extensive Comorbidities:  The patient's multiple chronic conditions necessitated careful coordination, monitoring, and integration of care plans., Data Analysis & Complex Decision-Making: I performed in-depth data review and complex treatment planning tailored to the patient's unique clinical profile., and Comprehensive Mental Health Care: Addressing mental health concerns that require comprehensive treatment plans, such as severe depression, anxiety, stress, or personality issues.       This document was synthesized by artificial intelligence ([employer]bridge) using HIPAA-compliant recording of the clinical interaction;   We discussed the use of AI scribe software for clinical note transcription with the patient, who gave verbal consent to proceed. additional Info: This encounter employed state-of-the-art, real-time, collaborative documentation. The patient actively reviewed and assisted in updating their electronic medical record on a shared screen, ensuring transparency and facilitating joint problem-solving for the problem list, overview, and plan. This approach promotes accurate, informed care. The treatment plan was discussed and reviewed in detail, including medication safety, potential side effects, and all patient questions. We confirmed understanding and comfort with the plan. Follow-up instructions were established, including contacting the office for any concerns, returning if symptoms worsen, persist, or new symptoms develop, and precautions for potential emergency department visits.

## 2024-05-29 NOTE — Assessment & Plan Note (Signed)
 Persistent depressive disorder and depression   Abilify  effectively improves mood. Anxiety twinges are present but not bothersome. He prefers to maintain the current Zoloft  dose of 100 mg despite considering an increase to 125 mg. Continue Abilify  and Zoloft  at current doses. Await neuropsychological evaluation results to guide further therapy.

## 2024-05-29 NOTE — Assessment & Plan Note (Signed)
 Cognitive impairment   very mild, not obvious on exam. Short-term memory issues are present. Donepezil  is used to aid memory. A neuropsychological evaluation is scheduled to assess cognitive function and guide further therapy. Continue donepezil  10 mg at bedtime and await evaluation results.

## 2024-05-29 NOTE — Assessment & Plan Note (Signed)
 Lab Results  Component Value Date   HGBA1C 6.2 (A) 04/24/2024  Well-controlled  Current Outpatient Medications (Endocrine & Metabolic):    empagliflozin  (JARDIANCE ) 10 MG TABS tablet, Take 1 tablet (10 mg total) by mouth daily before breakfast.   glimepiride  (AMARYL ) 4 MG tablet, Take 1 tablet (4 mg total) by mouth daily with breakfast.   metFORMIN  (GLUCOPHAGE ) 500 MG tablet, Take 1 tablet (500 mg total) by mouth 2 (two) times daily with a meal.  Current Outpatient Medications (Cardiovascular):    atorvastatin  (LIPITOR ) 40 MG tablet, Take 40 mg by mouth daily.   atorvastatin  (LIPITOR ) 40 MG tablet, Take 1 tablet (40 mg total) by mouth daily.   fenofibrate  160 MG tablet, Take 1 tablet (160 mg total) by mouth in the morning.   nitroGLYCERIN  (NITROSTAT ) 0.4 MG SL tablet, Place 1 tablet (0.4 mg total) under the tongue every 5 (five) minutes as needed for chest pain.

## 2024-05-29 NOTE — Patient Instructions (Signed)
 It was a pleasure seeing you today! Your health and satisfaction are our top priorities.  Shane Cone, MD  VISIT SUMMARY: During your visit, we discussed your ongoing cognitive changes, anxiety, sleep disturbances, and urinary frequency. We reviewed your current medications and their effects, and we planned further evaluations to better understand and manage your conditions.  YOUR PLAN: -PERSISTENT DEPRESSIVE DISORDER AND DEPRESSION: Persistent depressive disorder is a long-term form of depression. Your mood has improved with Abilify , and you are experiencing brief anxiety twinges. We will continue your current doses of Abilify  and Zoloft  and await the results of your neuropsychological evaluation to guide further therapy.  -PRIMARY INSOMNIA: Primary insomnia is difficulty falling or staying asleep. Your insomnia is managed with mirtazapine , and you are considering weaning off it. Continue taking mirtazapine  for sleep, and you may try reducing it if your sleep remains stable.  -COGNITIVE IMPAIRMENT: Cognitive impairment involves difficulties with memory and thinking. You are experiencing short-term memory issues and taking donepezil  to help. A neuropsychological evaluation is scheduled to assess your cognitive function and guide further therapy. Continue taking donepezil  10 mg at bedtime.  -URINARY FREQUENCY AND INCONTINENCE: Urinary frequency and incontinence involve needing to urinate often and sometimes not making it to the bathroom in time. You are experiencing these symptoms at night. We will refer you to a bladder specialist for further evaluation and management.  -GENERAL HEALTH MAINTENANCE: You remain active with computer activities, organizing, and exercising. You are considering changing dermatologists. Continue your current lifestyle activities and proceed with the dermatologist change if you wish.  INSTRUCTIONS: Please follow up with the neuropsychological evaluation as scheduled and  await further instructions based on the results. Additionally, we will refer you to a bladder specialist for your urinary symptoms. Continue your current medications and lifestyle activities, and consider weaning off mirtazapine  if your sleep remains stable.  Your Providers PCP: Ford Shane MATSU, MD,  [phone]) Referring Provider: Cone Shane MATSU, MD,  [phone]) Care Team Provider: Jeffrie Oneil BROCKS, MD,  [phone]) Care Team Provider: Dela Alm LABOR, MD,  [phone])  NEXT STEPS: [x]  Early Intervention: Schedule sooner appointment, call our on-call services, or go to emergency room if there is any significant Increase in pain or discomfort New or worsening symptoms Sudden or severe changes in your health [x]  Flexible Follow-Up: We recommend a No follow-ups on file. for optimal routine care. This allows for progress monitoring and treatment adjustments. [x]  Preventive Care: Schedule your annual preventive care visit! It's typically covered by insurance and helps identify potential health issues early. [x]  Lab & X-ray Appointments: Incomplete tests scheduled today, or call to schedule. X-rays: [facility] (M-F, 8:30am-noon or 1pm-5pm). [x]  Medical Information Release: Sign a release form at front desk to obtain relevant medical information we don't have.  MAKING THE MOST OF OUR FOCUSED 20 MINUTE APPOINTMENTS: [x]   Clearly state your top concerns at the beginning of the visit to focus our discussion [x]   If you anticipate you will need more time, please inform the front desk during scheduling - we can book multiple appointments in the same week. [x]   If you have transportation problems- use our convenient video appointments or ask about transportation support. [x]   We can get down to business faster if you use MyChart to update information before the visit and submit non-urgent questions before your visit. Thank you for taking the time to provide details through  MyChart.  Let our nurse know and she can import this information into your  encounter documents.  Arrival and Wait Times: [x]   Arriving on time ensures that everyone receives prompt attention. [x]   Early morning (8a) and afternoon (1p) appointments tend to have shortest wait times. [x]   Unfortunately, we cannot delay appointments for late arrivals or hold slots during phone calls.  Getting Answers and Following Up [x]   Simple Questions & Concerns: For quick questions or basic follow-up after your visit, reach us  at [phone] or MyChart messaging. [x]   Complex Concerns: If your concern is more complex, scheduling an appointment might be best. Discuss this with the staff to find the most suitable option. [x]   Lab & Imaging Results: We'll contact you directly if results are abnormal or you don't use MyChart. Most normal results will be on MyChart within 2-3 business days, with a review message from Dr. Jesus. Haven't heard back in 2 weeks? Need results sooner? Contact us  at [phone]. [x]   Referrals: Our referral coordinator will manage specialist referrals. The specialist's office should contact you within 2 weeks to schedule an appointment. Call us  if you haven't heard from them after 2 weeks.  Staying Connected [x]   MyChart: Activate your MyChart for the fastest way to access results and message us . See the last page of this paperwork for instructions on how to activate.  Bring to Your Next Appointment [x]   Medications: Please bring all your medication bottles to your next appointment to ensure we have an accurate record of your prescriptions. [x]   Health Diaries: If you're monitoring any health conditions at home, keeping a diary of your readings can be very helpful for discussions at your next appointment.  Billing [x]   X-ray & Lab Orders: These are billed by separate companies. Contact the invoicing company directly for questions or concerns. [x]   Visit Charges: Discuss any  billing inquiries with our administrative services team.  Your Satisfaction Matters [x]   Share Your Experience: We strive for your satisfaction! If you have any complaints, or preferably compliments, please let Dr. Jesus know directly or contact our Practice Administrators, Manuelita Rubin or [employer], by asking at the front desk.   Reviewing Your Records [x]   Review this early draft of your clinical encounter notes below and the final encounter summary tomorrow on MyChart after its been completed.  All orders placed so far are visible here: Major depressive disorder, recurrent, mild  Type 2 diabetes mellitus with peripheral angiopathy (HCC)  Persistent depressive disorder, moderate -     ARIPiprazole ; Take 1 tablet (2 mg total) by mouth daily.  Dispense: 90 tablet; Refill: 4  Status post coronary artery bypass grafting -     Atorvastatin  Calcium ; Take 1 tablet (40 mg total) by mouth daily.  Dispense: 90 tablet; Refill: 3  Primary insomnia  Memory impairment  Benign localized prostatic hyperplasia with lower urinary tract symptoms (LUTS)  Polypharmacy

## 2024-05-29 NOTE — Assessment & Plan Note (Signed)
 Urinary frequency and incontinence   Experiencing nocturia 2-3 times per night and occasional incontinence. A referral to a bladder specialist is planned for further evaluation and management.

## 2024-05-29 NOTE — Assessment & Plan Note (Signed)
 Insomnia persists but is managed with mirtazapine . He is considering weaning off mirtazapine  due to long-term use. Continue mirtazapine  for sleep management and consider weaning if sleep remains stable.

## 2024-06-01 ENCOUNTER — Other Ambulatory Visit: Payer: Self-pay | Admitting: Medical Genetics

## 2024-06-11 ENCOUNTER — Encounter: Payer: Self-pay | Admitting: Internal Medicine

## 2024-06-11 DIAGNOSIS — J3089 Other allergic rhinitis: Secondary | ICD-10-CM

## 2024-06-11 DIAGNOSIS — E1151 Type 2 diabetes mellitus with diabetic peripheral angiopathy without gangrene: Secondary | ICD-10-CM

## 2024-06-11 MED ORDER — IPRATROPIUM BROMIDE 0.03 % NA SOLN
2.0000 | Freq: Two times a day (BID) | NASAL | 1 refills | Status: AC
  Filled 2024-06-11 – 2024-07-12 (×2): qty 30, 30d supply, fill #0

## 2024-06-11 MED ORDER — EMPAGLIFLOZIN 10 MG PO TABS
10.0000 mg | ORAL_TABLET | Freq: Every day | ORAL | 4 refills | Status: AC
  Filled 2024-06-11 – 2024-07-12 (×2): qty 90, 90d supply, fill #0

## 2024-06-12 ENCOUNTER — Other Ambulatory Visit (HOSPITAL_COMMUNITY): Payer: Self-pay

## 2024-06-14 NOTE — Progress Notes (Signed)
" °  Device system confirmed to be MRI conditional, with implant date > 6 weeks ago, and no evidence of abandoned or epicardial leads in review of most recent CXR  Device last cleared by EP Provider: Daphne Barrack 06/13/24  Clearance is good through for 1 year as long as parameters remain stable at time of check. If pt undergoes a cardiac device procedure during that time, they should be re-cleared.   Tachy-therapies to be programmed off if applicable with device back to pre-MRI settings after completion of exam.  [employer] - Programming recommendation received through [employer] App/Tablet  Rocky Catalan, RT  06/14/2024 7:48 AM     "

## 2024-06-17 ENCOUNTER — Other Ambulatory Visit: Payer: Self-pay

## 2024-06-17 ENCOUNTER — Other Ambulatory Visit (HOSPITAL_COMMUNITY): Payer: Self-pay

## 2024-06-18 ENCOUNTER — Ambulatory Visit (HOSPITAL_COMMUNITY)
Admission: RE | Admit: 2024-06-18 | Discharge: 2024-06-18 | Disposition: A | Discharge status: Home / Self Care | Source: Ambulatory Visit | Attending: Physician Assistant | Admitting: Physician Assistant

## 2024-06-18 DIAGNOSIS — R413 Other amnesia: Secondary | ICD-10-CM | POA: Insufficient documentation

## 2024-06-18 DIAGNOSIS — G319 Degenerative disease of nervous system, unspecified: Secondary | ICD-10-CM

## 2024-06-18 NOTE — Progress Notes (Signed)
 Patient was monitored by this RN during MRI scan due to presence of a pacemaker. Cardiac rhythm was continuously monitored throughout the procedure. Prior to the start of the scan, the pacemaker was placed in MRI-safe mode by the MRI technician and/or pacemaker representative. Following the completion of the scan, the device was returned to its pre-MRI settings. Neurological status and orientation post-procedure were unchanged from baseline.   Pre-procedure Heart Rate (Prior to being placed in MRI safe mode): 61    Post-procedure Heart Rate (Once pacemaker is returned to baseline mode): 61

## 2024-06-20 NOTE — Progress Notes (Signed)
 "  Office Visit Note  Patient: Shane Ford             Date of Birth: 22-Sep-1945           MRN: [id number]             PCP: Jesus Bernardino MATSU, MD Referring: Jesus Bernardino MATSU, MD Visit Date: 07/04/2024 Occupation: Data Unavailable  Subjective:  Pain in left hip  History of Present Illness: Shane Ford is a 78 y.o. male seen for the evaluation of psoriatic arthritis and psoriasis.  According the patient his symptoms started in his 23s with nail dystrophy psoriasis and joint pain.  He was seen by dermatologist and was diagnosed with psoriasis.  He was also seen by rheumatologist who diagnosed him with psoriatic arthritis.  He was started on methotrexate  and the dose was gradually increased.  He states the symptoms of psoriasis and arthritis quickly resolved after increasing the dose of methotrexate .  He states his psoriatic arthritis has been well-controlled on methotrexate .  He used to get labs every 4 months by his rheumatologist in [place], Dr. Defoor.  He states he is doing well on methotrexate .  He has some chronic lower back discomfort.  He also have off-and-on stiffness and discomfort in his hands, left hip which he describes over the trochanteric region.  He had an episode of right knee joint discomfort which resolved after a cortisone injection given to his right knee.  He denies any history of plantar fasciitis, Achilles tendinitis or uveitis.  He states the left hip pain radiates down into his left lower extremity at times.  The pain gets worse after prolonged sitting and after laying down on the left side.  He has not had any recent episodes of joint swelling.  He has been taking methotrexate  8 tablets p.o. weekly along with folic acid  1 mg p.o. daily. He is right-handed, married.  He has 2 sons.  His son has psoriatic arthritis.  He does not drink alcohol  and never been a smoker.  He is retired wellsite geologist.  He enjoys gardening.  He also walks for exercise and  lifts weights.    Activities of Daily Living:  Patient reports morning stiffness for 15-20 minutes.   Patient Denies nocturnal pain.  Difficulty dressing/grooming: Denies Difficulty climbing stairs: Denies Difficulty getting out of chair: Reports Difficulty using hands for taps, buttons, cutlery, and/or writing: Reports  Review of Systems  Constitutional:  Negative for fatigue.  HENT:  Negative for mouth sores and mouth dryness.   Eyes:  Positive for dryness.  Respiratory:  Negative for shortness of breath.   Cardiovascular:  Negative for chest pain and palpitations.  Gastrointestinal:  Positive for constipation. Negative for blood in stool and diarrhea.  Endocrine: Negative for increased urination.  Genitourinary:  Negative for involuntary urination.  Musculoskeletal:  Positive for myalgias, muscle weakness, morning stiffness and myalgias. Negative for joint pain, gait problem, joint pain, joint swelling and muscle tenderness.  Skin:  Positive for sensitivity to sunlight. Negative for color change and rash.  Allergic/Immunologic: Negative for susceptible to infections.  Neurological:  Negative for dizziness and headaches.  Hematological:  Negative for swollen glands.  Psychiatric/Behavioral:  Negative for depressed mood and sleep disturbance. The patient is not nervous/anxious.     PMFS History:  Patient Active Problem List   Diagnosis Date Noted   Memory impairment 05/14/2024   Primary insomnia 04/24/2024   Benign localized prostatic hyperplasia with lower urinary tract symptoms (LUTS)  04/24/2024   Persistent depressive disorder, moderate 04/24/2024   Psoriatic arthropathy (HCC) 02/16/2024   CAD (coronary artery disease), native coronary artery 02/16/2024   Kidney stones 02/16/2024   Major depressive disorder, recurrent, mild 11/24/2022   Cervical disc disease 11/11/2021   Pacemaker 11/01/2021   PVC's (premature ventricular contractions) 11/01/2021   AV heart block  07/20/2021   Status post coronary artery bypass grafting 01/19/2021   Type 2 diabetes mellitus with peripheral angiopathy (HCC) 05/03/2018   B12 deficiency 10/19/2015   Combined hyperlipidemia 04/10/2014    Past Medical History:  Diagnosis Date   Acute inferior myocardial infarction (HCC) 10/22/2022   Acute myocardial infarction [facility])    Arthritis    pt states he was diagnosed 10+ years ago; psoriatic arthritis   Chest pain at rest 01/17/2021   Coronary artery disease    Depression    pt diagnosed 10+ years ago   Diabetes mellitus (HCC)    Dysrhythmia 01/17/2021   first degree AV block, 2nd degree AVB, Mobitz I in setting of MI   Hyperlipidemia    Hypertension    ST elevation myocardial infarction (STEMI) (HCC)     Family History  Problem Relation Age of Onset   Stroke Mother    Heart Problems Mother    Lung cancer Father    Heart Problems Sister    Stomach cancer Sister    Cancer Sister    Cancer Sister    Heart Problems Sister    Arthritis Son        psoriatic arthritis   Past Surgical History:  Procedure Laterality Date   ANTERIOR CERVICAL DECOMP/DISCECTOMY FUSION N/A 05/12/2021   Procedure: ANTERIOR CERVICAL DECOMPRESSION FUSION CERVICAL 5- CERVICAL 6 WITH INSTRUMENTATION AND ALLOGRAFT;  Surgeon: Beuford Anes, MD;  Location: [facility] OR;  Service: Orthopedics;  Laterality: N/A;   CARDIAC CATHETERIZATION     CORONARY ARTERY BYPASS GRAFT N/A 01/19/2021   Procedure: CORONARY ARTERY BYPASS GRAFTING (CABG) TIMES FOUR, ON PUMP, USING LEFT INTERNAL MAMMARY ARTERY AND ENDOSCOPICALLY HARVESTED GREATER SAPHENOUS VEIN;  Surgeon: Shyrl Linnie KIDD, MD;  Location: [facility] OR;  Service: Open Heart Surgery;  Laterality: N/A;  flow trac   CORONARY/GRAFT ACUTE MI REVASCULARIZATION N/A 10/22/2022   Procedure: Coronary/Graft Acute MI Revascularization;  Surgeon: Dann Candyce RAMAN, MD;  Location: [facility] INVASIVE CV LAB;  Service: Cardiovascular;  Laterality: N/A;   ENDOVEIN HARVEST OF GREATER  SAPHENOUS VEIN Right 01/19/2021   Procedure: ENDOVEIN HARVEST OF GREATER SAPHENOUS VEIN;  Surgeon: Shyrl Linnie KIDD, MD;  Location: [facility] OR;  Service: Open Heart Surgery;  Laterality: Right;   EYE SURGERY Bilateral 2021   cataracts replaced in both eyes   LEFT HEART CATH AND CORONARY ANGIOGRAPHY N/A 01/17/2021   Procedure: LEFT HEART CATH AND CORONARY ANGIOGRAPHY;  Surgeon: Claudene Victory ORN, MD;  Location: [facility] INVASIVE CV LAB;  Service: Cardiovascular;  Laterality: N/A;   LEFT HEART CATH AND CORS/GRAFTS ANGIOGRAPHY N/A 10/22/2022   Procedure: LEFT HEART CATH AND CORS/GRAFTS ANGIOGRAPHY;  Surgeon: Dann Candyce RAMAN, MD;  Location: [facility] INVASIVE CV LAB;  Service: Cardiovascular;  Laterality: N/A;   PACEMAKER IMPLANT N/A 07/28/2021   Procedure: PACEMAKER IMPLANT;  Surgeon: Waddell Danelle ORN, MD;  Location: [facility] INVASIVE CV LAB;  Service: Cardiovascular;  Laterality: N/A;   SQUAMOUS CELL CARCINOMA EXCISION     4- on head, neck and lower back   TEE WITHOUT CARDIOVERSION N/A 01/19/2021   Procedure: TRANSESOPHAGEAL ECHOCARDIOGRAM (TEE);  Surgeon: Shyrl Linnie KIDD, MD;  Location: [facility] OR;  Service:  Open Heart Surgery;  Laterality: N/A;   Social History[1] Social History   Social History Narrative   Right handed   One story story home   Retired   Drinks soft drinks   Lives with his wife   2 sons     Immunization History  Administered Date(s) Administered   Fluzone  Influenza virus vaccine,trivalent (IIV3), split virus 04/03/2015   INFLUENZA, HIGH DOSE SEASONAL PF 03/31/2023, 04/03/2024   Influenza Inj Mdck Quad Pf 04/19/2016, 05/02/2017, 04/26/2018, 03/26/2019, 04/05/2021, 04/07/2022   Influenza,inj,Quad PF,6+ Mos 03/31/2020   Influenza-Unspecified 03/22/2013, 03/28/2014, 05/02/2017, 04/06/2023   Moderna Covid-19 Fall Seasonal Vaccine 17yrs & older 04/27/2022   Moderna Sars-Covid-2 Vaccination 06/23/2020   PFIZER(Purple Top)SARS-COV-2 Vaccination 08/02/2019, 08/27/2019   PNEUMOCOCCAL  CONJUGATE-20 04/11/2023   Pneumococcal Conjugate-13 10/27/2016, 05/02/2017   Pneumococcal Polysaccharide-23 10/03/2012   Respiratory Syncytial Virus Vaccine,Recomb Aduvanted(Arexvy) 04/11/2023   Td (Adult),5 Lf Tetanus Toxid, Preservative Free 11/11/2021   Zoster Recombinant(Shingrix) 05/15/2019, 10/24/2019   Zoster, Live 09/14/2010, 05/15/2019, 10/18/2019     Objective: Vital Signs: BP (!) 143/74 (BP Location: Right Arm, Patient Position: Sitting, Cuff Size: Normal)   Pulse 60   Temp 97.7 F (36.5 C)   Resp 15   Ht 6' 1 (1.854 m)   Wt 184 lb 9.6 oz (83.7 kg)   BMI 24.36 kg/m    Physical Exam Vitals and nursing note reviewed.  Constitutional:      Appearance: He is well-developed.  HENT:     Head: Normocephalic and atraumatic.  Eyes:     Conjunctiva/sclera: Conjunctivae normal.     Pupils: Pupils are equal, round, and reactive to light.  Cardiovascular:     Rate and Rhythm: Normal rate and regular rhythm.     Heart sounds: Normal heart sounds.  Pulmonary:     Effort: Pulmonary effort is normal.     Breath sounds: Normal breath sounds.  Abdominal:     General: Bowel sounds are normal.     Palpations: Abdomen is soft.  Musculoskeletal:     Cervical back: Normal range of motion and neck supple.  Skin:    General: Skin is warm and dry.     Capillary Refill: Capillary refill takes less than 2 seconds.  Neurological:     Mental Status: He is alert and oriented to person, place, and time.  Psychiatric:        Behavior: Behavior normal.      Musculoskeletal Exam: Patient has limited range of motion of the cervical spine.  He had limited range of motion of the lumbar spine with some discomfort.  Shoulders, elbows, wrist joints, MCPs PIPs and DIPs were in good range of motion.  He had bilateral CMC, PIP and DIP thickening with no synovitis.  He had good range of motion of his bilateral hip joints with some discomfort.  Knee joints in good range of motion without any warmth  swelling or effusion.  There was no tenderness over ankles.  He had bilateral hammertoes with no synovitis or dactylitis.  CDAI Exam: CDAI Score: -- Patient Global: --; Provider Global: -- Swollen: --; Tender: -- Joint Exam 07/04/2024   No joint exam has been documented for this visit   There is currently no information documented on the homunculus. Go to the Rheumatology activity and complete the homunculus joint exam.  Investigation: No additional findings.  Imaging: MR BRAIN WO CONTRAST Result Date: 07/01/2024 CLINICAL DATA:  Memory impairment EXAM: MRI HEAD WITHOUT CONTRAST TECHNIQUE: Multiplanar, multiecho pulse sequences of the  brain and surrounding structures were obtained without intravenous contrast. COMPARISON:  June 01, 2020 FINDINGS: MRI brain: There is moderate atrophy.  The hippocampi are normal. The signal in the brain parenchyma is normal. There is no acute or chronic infarct. The ventricles are normal. No mass lesion. There are normal flow signals in the carotid arteries and basilar artery. No significant bone marrow signal abnormality. No significant abnormality in the paranasal sinuses or soft tissues. IMPRESSION: Moderate cerebral atrophy. This has progressed compared with the study from 2021. No other significant abnormality. Electronically Signed   By: Nancyann Burns M.D.   On: 07/01/2024 10:46    Recent Labs: Lab Results  Component Value Date   WBC 4.8 01/18/2023   HGB 14.4 01/18/2023   PLT 188 01/18/2023   NA 142 01/18/2023   K 4.9 01/18/2023   CL 105 01/18/2023   CO2 25 01/18/2023   GLUCOSE 110 (H) 01/18/2023   BUN 20 01/18/2023   CREATININE 1.23 01/18/2023   BILITOT 1.1 10/22/2022   ALKPHOS 29 (L) 10/22/2022   AST 27 10/22/2022   ALT 18 10/22/2022   PROT 5.6 (L) 10/22/2022   ALBUMIN  3.6 10/22/2022   CALCIUM  9.8 01/18/2023   GFRAA >60 12/22/2011   October 05, 2023 sed rate 2 November 27, 2023 lipid panel normal, TSH normal Speciality Comments: No  specialty comments available.  Procedures:  No procedures performed Allergies: Patient has no known allergies.   Assessment / Plan:     Visit Diagnoses: Psoriatic arthropathy (HCC) -patient states he was diagnosed with psoriatic arthritis in his 40s.  He was under care of [facility] clinic rheumatology.  He has been on methotrexate  since then.  He states after increasing the dose of methotrexate  his symptoms resolved.  He has had mild discomfort off-and-on.  He has not had any joint swelling in a long time.  He has been tolerating methotrexate  8 tablets p.o. weekly along with folic acid  1 mg p.o. daily without any side effects.  Plan: Sedimentation rate  Psoriasis-he developed psoriasis and nail pitting in his 40s at the same time when he developed joint pain.  He states the rash and nail pitting improved after taking methotrexate .  High risk medication use -methotrexate  8 tablets p.o. weekly, folic acid  1 mg p.o. daily.  Patient states he was getting his labs every 4 months in [place].  I advised him to get labs today and then every 3 months to monitor for drug toxicity.  Information regarding immunization was placed in the AVS.  He was advised to hold methotrexate  if he develops an infection resume after the infection resolves.  Plan: CBC with Differential/Platelet, Comprehensive metabolic panel with GFR, Sedimentation rate, Hepatitis B core antibody, IgM, Hepatitis B surface antigen, Hepatitis C antibody, QuantiFERON-TB Gold Plus, Serum protein electrophoresis with reflex, IgG, IgA, IgM  Pain in both hands -he continues to have some stiffness in his hands.  No synovitis was noted.  I will obtain baseline x-rays today.  Plan: XR Hand 2 View Right, XR Hand 2 View Left, x-rays were suggestive of inflammatory arthritis and osteoarthritis overlap.  Possible erosion was noted at the base of the left second proximal phalanx.  Rheumatoid factor, Cyclic citrul peptide antibody, IgG  Chronic pain of both  hips -he complains of discomfort in his hips.  He also describes discomfort in his hip radiating into his left lower extremity at times.  He did not have any tenderness over the trochanteric region.  Plan: XR HIPS BILAT W  OR W/O PELVIS 3-4 VIEWS.  X-rays showed bilateral moderate osteoarthritis.  Pain in both feet -he has stiffness in his feet.  Bilateral hammertoes were noted.  No dactylitis was noted.  Plan: XR Foot 2 Views Right, XR Foot 2 Views Left.  X-rays were suggestive of inflammatory arthritis and osteoarthritis overlap.  Cervical disc disease -he had limited range of motion of the cervical spine.  S/p fusion November 2022  Chronic midline low back pain without sciatica -he continues to have some discomfort in his lower back.  Plan: XR Lumbar Spine 2-3 Views.  X-rays were suggestive of facet joint arthropathy.  Other medical problems are listed as follows:  Coronary artery disease involving native coronary artery of native heart without angina pectoris  Status post coronary artery bypass grafting  AV heart block  Pacemaker  Combined hyperlipidemia  Type 2 diabetes mellitus with peripheral angiopathy (HCC)  Benign localized prostatic hyperplasia with lower urinary tract symptoms (LUTS)  Primary insomnia  Major depressive disorder, recurrent, mild  B12 deficiency  Kidney stones  Memory impairment  Family history of psoriatic arthritis-son  Orders: Orders Placed This Encounter  Procedures   XR HIPS BILAT W OR W/O PELVIS 3-4 VIEWS   XR Hand 2 View Right   XR Hand 2 View Left   XR Foot 2 Views Right   XR Foot 2 Views Left   XR Lumbar Spine 2-3 Views   CBC with Differential/Platelet   Comprehensive metabolic panel with GFR   Sedimentation rate   Rheumatoid factor   Cyclic citrul peptide antibody, IgG   Hepatitis B core antibody, IgM   Hepatitis B surface antigen   Hepatitis C antibody   QuantiFERON-TB Gold Plus   Serum protein electrophoresis with reflex    IgG, IgA, IgM   No orders of the defined types were placed in this encounter.   Face-to-face time spent with patient was 60 minutes. Greater than 50% of time was spent in counseling and coordination of care.  Follow-Up Instructions: Return for Psoriatic arthritis.   Maya Nash, MD  Note - This record has been created using Animal nutritionist.  Chart creation errors have been sought, but may not always  have been located. Such creation errors do not reflect on  the standard of medical care.     [1]  Social History Tobacco Use   Smoking status: Never    Passive exposure: Past   Smokeless tobacco: Never  Vaping Use   Vaping status: Never Used  Substance Use Topics   Alcohol  use: Never   Drug use: Never   "

## 2024-06-21 ENCOUNTER — Other Ambulatory Visit (HOSPITAL_BASED_OUTPATIENT_CLINIC_OR_DEPARTMENT_OTHER): Payer: Self-pay

## 2024-06-21 ENCOUNTER — Other Ambulatory Visit: Payer: Self-pay

## 2024-06-22 ENCOUNTER — Other Ambulatory Visit (HOSPITAL_COMMUNITY): Payer: Self-pay

## 2024-06-23 ENCOUNTER — Other Ambulatory Visit (HOSPITAL_COMMUNITY): Payer: Self-pay

## 2024-06-25 ENCOUNTER — Ambulatory Visit: Admitting: Psychology

## 2024-06-25 ENCOUNTER — Ambulatory Visit

## 2024-06-25 DIAGNOSIS — R4189 Other symptoms and signs involving cognitive functions and awareness: Secondary | ICD-10-CM | POA: Diagnosis not present

## 2024-06-25 DIAGNOSIS — F418 Other specified anxiety disorders: Secondary | ICD-10-CM | POA: Diagnosis not present
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
## 2024-06-26 ENCOUNTER — Ambulatory Visit: Payer: Self-pay

## 2024-06-26 ENCOUNTER — Institutional Professional Consult (permissible substitution): Admitting: Psychology

## 2024-07-01 ENCOUNTER — Ambulatory Visit: Payer: Self-pay | Admitting: Physician Assistant

## 2024-07-03 ENCOUNTER — Ambulatory Visit: Admitting: Psychology

## 2024-07-03 DIAGNOSIS — R4189 Other symptoms and signs involving cognitive functions and awareness: Secondary | ICD-10-CM | POA: Diagnosis not present

## 2024-07-03 DIAGNOSIS — F418 Other specified anxiety disorders: Secondary | ICD-10-CM

## 2024-07-03 NOTE — Progress Notes (Signed)
" ° °  NEUROPSYCHOLOGY FEEDBACK SESSION [place]  [place] Department of Neurology  Date of Feedback Session: 07/03/2024  REASON FOR REFERRAL   Shane Ford is a 79 year old, right-handed, White male with 14 years of formal education. He was referred for neuropsychological evaluation by Camie Sevin, PA-C, to assess current neurocognitive functioning, document potential cognitive deficits, and assist with treatment planning. This is his first neuropsychological evaluation.  FEEDBACK   Patient completed a comprehensive neuropsychological evaluation on 06/25/2024. Please refer to that encounter for the full report and recommendations. Briefly, results indicated normal cognitive functioning. There is no evidence of a neurocognitive disorder at this time. Subjective cognitive concerns are likely multifactorial, related to normal aging, mood disturbance, hearing loss, and cerebrovascular changes.   Today, the patient was accompanied by his wife. They were provided verbal feedback regarding the findings and impression during this visit, and their questions were answered. A copy of the report was provided at the conclusion of the visit.  DISPOSITION   Patient will follow up with the referring provider, Ms. Wertman. No follow-up neuropsychological testing was scheduled at this time. Please feel free to refer the patient for repeated evaluation if he shows a significant change in neurocognitive status.  SERVICE   This feedback session was conducted by Renda Beckwith, Psy.D. One unit of 03867 (35 minutes) was billed for Dr. Beckwith' time spent in preparing, conducting, and documenting the current feedback session.  This report was generated using voice recognition software. While this document has been carefully reviewed, transcription errors may be present. I apologize in advance for any inconvenience. Please contact me if further clarification is needed.  "

## 2024-07-04 ENCOUNTER — Encounter: Payer: Self-pay | Admitting: Rheumatology

## 2024-07-04 ENCOUNTER — Ambulatory Visit

## 2024-07-04 ENCOUNTER — Ambulatory Visit: Attending: Rheumatology | Admitting: Rheumatology

## 2024-07-04 ENCOUNTER — Telehealth: Payer: Self-pay

## 2024-07-04 VITALS — BP 143/74 | HR 60 | Temp 97.7°F | Resp 15 | Ht 73.0 in | Wt 184.6 lb

## 2024-07-04 DIAGNOSIS — Z79899 Other long term (current) drug therapy: Secondary | ICD-10-CM | POA: Diagnosis not present

## 2024-07-04 DIAGNOSIS — L4050 Arthropathic psoriasis, unspecified: Secondary | ICD-10-CM

## 2024-07-04 DIAGNOSIS — Z951 Presence of aortocoronary bypass graft: Secondary | ICD-10-CM

## 2024-07-04 DIAGNOSIS — M79642 Pain in left hand: Secondary | ICD-10-CM | POA: Diagnosis not present

## 2024-07-04 DIAGNOSIS — E782 Mixed hyperlipidemia: Secondary | ICD-10-CM

## 2024-07-04 DIAGNOSIS — M25552 Pain in left hip: Secondary | ICD-10-CM

## 2024-07-04 DIAGNOSIS — M79641 Pain in right hand: Secondary | ICD-10-CM

## 2024-07-04 DIAGNOSIS — M545 Low back pain, unspecified: Secondary | ICD-10-CM

## 2024-07-04 DIAGNOSIS — L409 Psoriasis, unspecified: Secondary | ICD-10-CM

## 2024-07-04 DIAGNOSIS — R413 Other amnesia: Secondary | ICD-10-CM

## 2024-07-04 DIAGNOSIS — M79672 Pain in left foot: Secondary | ICD-10-CM | POA: Diagnosis not present

## 2024-07-04 DIAGNOSIS — E1151 Type 2 diabetes mellitus with diabetic peripheral angiopathy without gangrene: Secondary | ICD-10-CM

## 2024-07-04 DIAGNOSIS — N200 Calculus of kidney: Secondary | ICD-10-CM

## 2024-07-04 DIAGNOSIS — E538 Deficiency of other specified B group vitamins: Secondary | ICD-10-CM

## 2024-07-04 DIAGNOSIS — I2510 Atherosclerotic heart disease of native coronary artery without angina pectoris: Secondary | ICD-10-CM | POA: Diagnosis not present

## 2024-07-04 DIAGNOSIS — M25551 Pain in right hip: Secondary | ICD-10-CM

## 2024-07-04 DIAGNOSIS — N401 Enlarged prostate with lower urinary tract symptoms: Secondary | ICD-10-CM

## 2024-07-04 DIAGNOSIS — G8929 Other chronic pain: Secondary | ICD-10-CM

## 2024-07-04 DIAGNOSIS — F330 Major depressive disorder, recurrent, mild: Secondary | ICD-10-CM

## 2024-07-04 DIAGNOSIS — M79671 Pain in right foot: Secondary | ICD-10-CM

## 2024-07-04 DIAGNOSIS — Z950 Presence of cardiac pacemaker: Secondary | ICD-10-CM

## 2024-07-04 DIAGNOSIS — M5090 Cervical disc disorder, unspecified, unspecified cervical region: Secondary | ICD-10-CM

## 2024-07-04 DIAGNOSIS — I4430 Unspecified atrioventricular block: Secondary | ICD-10-CM | POA: Diagnosis not present

## 2024-07-04 DIAGNOSIS — F5101 Primary insomnia: Secondary | ICD-10-CM

## 2024-07-04 DIAGNOSIS — Z840 Family history of diseases of the skin and subcutaneous tissue: Secondary | ICD-10-CM

## 2024-07-04 NOTE — Telephone Encounter (Signed)
 Pending lab results from today, we will refill methotrexate  tablets and folic acid  2mg  po every day per Dr. Dolphus.   Consent obtained and sent to the scan center.  Patient's preferred pharmacy: [facility] on [address].

## 2024-07-04 NOTE — Patient Instructions (Signed)
 Methotrexate  Tablets What is this medication? METHOTREXATE  (METH oh TREX ate) treats autoimmune conditions, such as arthritis and psoriasis. It works by decreasing inflammation, which can reduce pain and prevent long-term injury to the joints and skin. It may also be used to treat some types of cancer. It works by slowing down the growth of cancer cells. This medicine may be used for other purposes; ask your health care provider or pharmacist if you have questions. COMMON BRAND NAME(S): Rheumatrex, Trexall  What should I tell my care team before I take this medication? They need to know if you have any of these conditions: Dehydration Diabetes Fluid in the stomach area or lungs Frequently drink alcohol  Having surgery, including dental surgery High cholesterol Immune system problems Inflammatory bowel disease, such as ulcerative colitis Kidney disease Liver disease Low blood cell levels (white cells, red cells, and platelets) Lung disease Recent or ongoing radiation Recent or upcoming vaccine Stomach ulcers, other stomach or intestine problems An unusual or allergic reaction to methotrexate , other medications, foods, dyes, or preservatives Pregnant or trying to get pregnant Breastfeeding How should I use this medication? Take this medication by mouth with water. Take it as directed on the prescription label. Do not take extra. Keep taking this medication until your care team tells you to stop. Know why you are taking this medication and how you should take it. To treat conditions such as arthritis and psoriasis, this medication is taken ONCE A WEEK as a single dose or divided into 3 smaller doses taken 12 hours apart (do not take more than 3 doses 12 hours apart each week). This medication is NEVER taken daily to treat conditions other than cancer. Taking this medication more often than directed can cause serious side effects, even death. Talk to your care team about why you are taking this  medication, how often you will take it, and what your dose is. Ask your care team to put the reason you take this medication on the prescription. If you take this medication ONCE A WEEK, choose a day of the week before you start. Ask your pharmacist to include the day of the week on the label. Avoid Monday, which could be misread as Morning. Handling this medication may be harmful. Talk to your care team about how to handle this medication. Special instructions may apply. Talk to your care team about the use of this medication in children. While it may be prescribed for selected conditions, precautions do apply. Overdosage: If you think you have taken too much of this medicine contact a poison control center or emergency room at once. NOTE: This medicine is only for you. Do not share this medicine with others. What if I miss a dose? If you miss a dose, talk with your care team. Do not take double or extra doses. What may interact with this medication? Do not take this medication with any of the following: Acitretin Live virus vaccines Probenecid This medication may also interact with the following: Alcohol  Aspirin  and aspirin -like medications Certain antibiotics, such as penicillin, neomycin, sulfamethoxazole; trimethoprim Certain medications for stomach problems, such as lansoprazole, omeprazole , pantoprazole  Clozapine Cyclosporine Dapsone Folic acid  Foscarnet NSAIDs, medications for pain and inflammation, such as ibuprofen or naproxen  Phenytoin Pyrimethamine Steroid medications, such as prednisone or cortisone Tacrolimus Theophylline This list may not describe all possible interactions. Give your health care provider a list of all the medicines, herbs, non-prescription drugs, or dietary supplements you use. Also tell them if you smoke, drink alcohol , or use  illegal drugs. Some items may interact with your medicine. What should I watch for while using this medication? Visit your  care team for regular checks on your progress. It may be some time before you see the benefit from this medication. You may need blood work done while you are taking this medication. If your care team has also prescribed folic acid , they may instruct you to skip your folic acid  dose on the day you take methotrexate . This medication can make you more sensitive to the sun. Keep out of the sun. If you cannot avoid being in the sun, wear protective clothing and sunscreen. Do not use sun lamps, tanning beds, or tanning booths. Check with your care team if you have severe diarrhea, nausea, and vomiting, or if you sweat a lot. The loss of too much body fluid may make it dangerous for you to take this medication. This medication may increase your risk of getting an infection. Call your care team for advice if you get a fever, chills, sore throat, or other symptoms of a cold or flu. Do not treat yourself. Try to avoid being around people who are sick. Talk to your care team about your risk of cancer. You may be more at risk for certain types of cancers if you take this medication. Talk to your care team if you or your partner may be pregnant. Serious birth defects can occur if you take this medication during pregnancy and for 6 months after the last dose. You will need a negative pregnancy test before starting this medication. Contraception is recommended while taking this medication and for 6 months after the last dose. Your care team can help you find the option that works for you. If your partner can get pregnant, use a condom during sex while taking this medication and for 3 months after the last dose. Do not breastfeed while taking this medication and for 1 week after the last dose. This medication may cause infertility. Talk to your care team if you are concerned about your fertility. What side effects may I notice from receiving this medication? Side effects that you should report to your care team as soon  as possible: Allergic reactions--skin rash, itching, hives, swelling of the face, lips, tongue, or throat Dry cough, shortness of breath or trouble breathing Infection--fever, chills, cough, sore throat, wounds that don't heal, pain or trouble when passing urine, general feeling of discomfort or being unwell Kidney injury--decrease in the amount of urine, swelling of the ankles, hands, or feet Liver injury--right upper belly pain, loss of appetite, nausea, light-colored stool, dark yellow or brown urine, yellowing skin or eyes, unusual weakness or fatigue Low red blood cell level--unusual weakness or fatigue, dizziness, headache, trouble breathing Pain, tingling, or numbness in the hands or feet, muscle weakness, change in vision, confusion or trouble speaking, loss of balance or coordination, trouble walking, seizures Redness, blistering, peeling, or loosening of the skin, including inside the mouth Stomach bleeding--bloody or black, tar-like stools, vomiting blood or brown material that looks like coffee grounds Stomach pain that is severe, does not go away, or gets worse Unusual bruising or bleeding Side effects that usually do not require medical attention (report these to your care team if they continue or are bothersome): Diarrhea Dizziness Hair loss Nausea Pain, redness, or swelling with sores inside the mouth or throat Skin reactions on sun-exposed areas Vomiting This list may not describe all possible side effects. Call your doctor for medical advice about side effects.  You may report side effects to FDA at 1-800-FDA-1088. Where should I keep my medication? Keep out of the reach of children and pets. Store at room temperature between 20 and 25 degrees C (68 and 77 degrees F). Protect from light. Keep the container tightly closed. Get rid of any unused medication after the expiration date. To get rid of medications that are no longer needed or have expired: Take the medication to a  medication take-back program. Check with your pharmacy or law enforcement to find a location. If you cannot return the medication, ask your pharmacist or care team how to get rid of this medication safely. NOTE: This sheet is a summary. It may not cover all possible information. If you have questions about this medicine, talk to your doctor, pharmacist, or health care provider.  2024 Elsevier/Gold Standard (2023-05-26 00:00:00)  Standing Labs We placed an order today for your standing lab work.   Please have your standing labs drawn in April and every 3 months  Please have your labs drawn 2 weeks prior to your appointment so that the provider can discuss your lab results at your appointment, if possible.  Please note that you may see your imaging and lab results in MyChart before we have reviewed them. We will contact you once all results are reviewed. Please allow our office up to 72 hours to thoroughly review all of the results before contacting the office for clarification of your results.  WALK-IN LAB HOURS  Monday through Thursday from 8:00 am - 4:30 pm and Friday from 8:00 am-12:00 pm.  Patients with office visits requiring labs will be seen before walk-in labs.  You may encounter longer than normal wait times. Please allow additional time. Wait times may be shorter on  Monday and Thursday afternoons.  We do not book appointments for walk-in labs. We appreciate your patience and understanding with our staff.   Labs are drawn by [facility]. Please bring your co-pay at the time of your lab draw.  You may receive a bill from [facility] for your lab work.  Please note if you are on Hydroxychloroquine and and an order has been placed for a Hydroxychloroquine level,  you will need to have it drawn 4 hours or more after your last dose.  If you wish to have your labs drawn at another location, please call the office 24 hours in advance so we can fax the orders.  The office is located at [address], [place], [place] [address]   If you have any questions regarding directions or hours of operation,  please call [phone].   As a reminder, please drink plenty of water prior to coming for your lab work. Thanks!   Vaccines You are taking a medication(s) that can suppress your immune system.  The following immunizations are recommended: Flu annually Covid-19  RSV Td/Tdap (tetanus, diphtheria, pertussis) every 10 years Pneumonia (Prevnar 15 then Pneumovax 23 at least 1 year apart.  Alternatively, can take Prevnar 20 without needing additional dose) Shingrix: 2 doses from 4 weeks to 6 months apart  Please check with your PCP to make sure you are up to date.   If you have signs or symptoms of an infection or start antibiotics: First, call your PCP for workup of your infection. Hold your medication through the infection, until you complete your antibiotics, and until symptoms resolve if you take the following: Injectable medication (Actemra, Benlysta, Cimzia, Cosentyx, Enbrel, Humira, Kevzara, Orencia, Remicade, Simponi, Stelara, Taltz,  Tremfya) Methotrexate  Leflunomide (Arava) Mycophenolate (Cellcept) Earma Jewel, or Rinvoq  Hip Exercises Ask your health care provider which exercises are safe for you. Do exercises exactly as told by your provider and adjust them as told. It is normal to feel mild stretching, pulling, tightness, or discomfort as you do these exercises. Stop right away if you feel sudden pain or your pain gets worse. Do not begin these exercises until told by your provider. Stretching and range-of-motion exercises These exercises warm up your muscles and joints and improve the movement and flexibility of your hip. They also help to relieve pain, numbness, and tingling. You may be asked to limit your range of motion if you had a hip replacement. Talk to your provider about these limits. Hamstrings, supine  Lie on your back (supine  position). Loop a belt, towel, or exercise band over the ball of your left / right foot. The ball of your foot is on the walking surface, right under your toes. Straighten your left / right knee and slowly pull on the belt, towel, or band to raise your leg until you feel a gentle stretch behind your knee (hamstring). Do not let your knee bend while you do this. Keep your other leg flat on the floor. Hold this position for __________ seconds. Slowly return your leg to the starting position. Repeat __________ times. Complete this exercise __________ times a day. Hip rotation  Lie on your back on a firm surface. With your left / right hand, gently pull your left / right knee toward the shoulder that is on the same side of the body. Stop when your knee is pointing toward the ceiling. Hold your left / right ankle with your other hand. Keeping your knee steady, gently pull your left / right ankle toward your other shoulder until you feel a stretch in your butt. Keep your hips and shoulders firmly planted while you do this stretch. Hold this position for __________ seconds. Repeat __________ times. Complete this exercise __________ times a day. Seated stretch This exercise is sometimes called hamstrings and adductors stretch. Sit on the floor with your legs stretched wide. Keep your knees straight during this exercise. Keeping your head and back in a straight line, bend at your waist to reach for your left foot (position A). You should feel a stretch in your right inner thigh (adductors). Hold this position for __________ seconds. Then slowly return to the upright position. Keeping your head and back in a straight line, bend at your waist to reach forward (position B). You should feel a stretch behind both of your thighs and knees (hamstrings). Hold this position for __________ seconds. Then slowly return to the upright position. Keeping your head and back in a straight line, bend at your waist to  reach for your right foot (position C). You should feel a stretch in your left inner thigh (adductors). Hold this position for __________ seconds. Then slowly return to the upright position. Repeat __________ times. Complete this exercise __________ times a day. Lunge This exercise stretches the muscles of the hip (hip flexors). Place your left / right knee on the floor and bend your other knee so that is directly over your ankle. You should be half-kneeling. Keep good posture with your head over your shoulders. Tighten your butt muscles to point your tailbone downward. This will prevent your back from arching too much. You should feel a gentle stretch in the front of your left / right thigh and hip. If you  do not feel a stretch, slide your other foot forward slightly and then slowly lunge forward with your chest up until your knee once again lines up over your ankle. Make sure your tailbone continues to point downward. Hold this position for __________ seconds. Slowly return to the starting position. Repeat __________ times. Complete this exercise __________ times a day. Strengthening exercises These exercises build strength and endurance in your hip. Endurance is the ability to use your muscles for a long time, even after they get tired. Bridge This exercise strengthens the muscles of your hip (hip extensors). Lie on your back on a firm surface with your knees bent and your feet flat on the floor. Tighten your butt muscles and lift your bottom off the floor until the trunk of your body and your hips are level with your thighs. Do not arch your back. You should feel the muscles working in your butt and the back of your thighs. If you do not feel these muscles, slide your feet 1-2 inches (2.5-5 cm) farther away from your butt. Hold this position for __________ seconds. Slowly lower your hips to the starting position. Let your muscles relax completely between repetitions. Repeat __________  times. Complete this exercise __________ times a day. Straight leg raises, side-lying This exercise strengthens the muscles that move the hip joint away from the center of the body (hip abductors). Lie on your side with your left / right leg in the top position. Lie so your head, shoulder, hip, and knee line up. You may bend your bottom knee slightly to help you balance. Roll your hips slightly forward, so your hips are stacked directly over each other and your left / right knee is facing forward. Leading with your heel, lift your top leg 4-6 inches (10-15 cm). You should feel the muscles in your top hip lifting. Do not let your foot drift forward. Do not let your knee roll toward the ceiling. Hold this position for __________ seconds. Slowly return to the starting position. Let your muscles relax completely between repetitions. Repeat __________ times. Complete this exercise __________ times a day. Straight leg raises, side-lying This exercise strengthens the muscles that move the hip joint toward the center of the body (hip adductors). Lie on your side with your left / right leg in the bottom position. Lie so your head, shoulder, hip, and knee line up. You may place your upper foot in front to help you balance. Roll your hips slightly forward, so your hips are stacked directly over each other and your left / right knee is facing forward. Tense the muscles in your inner thigh and lift your bottom leg 4-6 inches (10-15 cm). Hold this position for __________ seconds. Slowly return to the starting position. Let your muscles relax completely between repetitions. Repeat __________ times. Complete this exercise __________ times a day. Straight leg raises, supine This exercise strengthens the muscles in the front of your thigh (quadriceps and hip flexors). Lie on your back (supine position) with your left / right leg extended and your other knee bent. Tense the muscles in the front of your left /  right thigh. You should see your kneecap slide up or see increased dimpling just above your knee. Keep these muscles tight as you raise your leg 4-6 inches (10-15 cm) off the floor. Do not let your knee bend. Hold this position for __________ seconds. Keep these muscles tense as you lower your leg. Relax the muscles slowly and completely between repetitions. Repeat __________  times. Complete this exercise __________ times a day. Hip abductors, standing This exercise strengthens the muscles that move the leg and hip joint away from the center of the body (hip abductors). Tie one end of a rubber exercise band or tubing to a secure surface, such as a chair, table, or pole. Loop the other end of the band or tubing around your left / right ankle. Keeping your ankle with the band or tubing directly opposite the secured end, step away until there is tension in the tubing or band. Hold on to a chair, table, or pole as needed for balance. Lift your left / right leg out to your side. While you do this: Keep your back upright. Keep your shoulders over your hips. Keep your toes pointing forward. Make sure to use your hip muscles to slowly lift your leg. Do not tip your body or forcefully lift your leg. Hold this position for __________ seconds. Slowly return to the starting position. Repeat __________ times. Complete this exercise __________ times a day. Squats This exercise strengthens the muscles in the front of your thigh (quadriceps). Stand in front of a table, or stand in a doorframe so your feet and knees are in line with the frame. You may place your hands on the table or frame for balance. Slowly bend your knees and lower your hips like you are going to sit in a chair. Keep your lower legs in a straight up-and-down position. Do not let your hips go lower than your knees. Do not bend your knees lower than told by your provider. If your hip pain increases, do not bend as low. Hold this position  for ___________ seconds. Slowly push with your legs to return to standing. Do not use your hands to pull yourself to standing. Repeat __________ times. Complete this exercise __________ times a day. This information is not intended to replace advice given to you by your health care provider. Make sure you discuss any questions you have with your health care provider. Document Revised: 02/15/2022 Document Reviewed: 02/15/2022 Elsevier Patient Education  2024 [employer].

## 2024-07-07 ENCOUNTER — Ambulatory Visit: Payer: Self-pay | Admitting: Rheumatology

## 2024-07-07 NOTE — Progress Notes (Signed)
 CBC normal, CMP normal, sed rate normal, RF negative, anti-CCP negative, hepatitis B and hepatitis C nonreactive, TB Gold negative, IgM low which is not significant, IFE pending.  I will discuss results at the follow-up visit.

## 2024-07-08 ENCOUNTER — Other Ambulatory Visit

## 2024-07-08 DIAGNOSIS — Z006 Encounter for examination for normal comparison and control in clinical research program: Secondary | ICD-10-CM

## 2024-07-08 LAB — GENECONNECT MOLECULAR SCREEN

## 2024-07-10 LAB — CBC WITH DIFFERENTIAL/PLATELET
Absolute Lymphocytes: 1170 {cells}/uL (ref 850–3900)
Absolute Monocytes: 558 {cells}/uL (ref 200–950)
Basophils Absolute: 12 {cells}/uL (ref 0–200)
Basophils Relative: 0.2 %
Eosinophils Absolute: 366 {cells}/uL (ref 15–500)
Eosinophils Relative: 6.1 %
HCT: 45.8 % (ref 39.4–51.1)
Hemoglobin: 14.4 g/dL (ref 13.2–17.1)
MCH: 29.3 pg (ref 27.0–33.0)
MCHC: 31.4 g/dL — ABNORMAL LOW (ref 31.6–35.4)
MCV: 93.1 fL (ref 81.4–101.7)
MPV: 10.3 fL (ref 7.5–12.5)
Monocytes Relative: 9.3 %
Neutro Abs: 3894 {cells}/uL (ref 1500–7800)
Neutrophils Relative %: 64.9 %
Platelets: 211 Thousand/uL (ref 140–400)
RBC: 4.92 Million/uL (ref 4.20–5.80)
RDW: 15 % (ref 11.0–15.0)
Total Lymphocyte: 19.5 %
WBC: 6 Thousand/uL (ref 3.8–10.8)

## 2024-07-10 LAB — COMPREHENSIVE METABOLIC PANEL WITH GFR
AG Ratio: 2.3 (calc) (ref 1.0–2.5)
ALT: 17 U/L (ref 9–46)
AST: 21 U/L (ref 10–35)
Albumin: 4.5 g/dL (ref 3.6–5.1)
Alkaline phosphatase (APISO): 44 U/L (ref 35–144)
BUN: 25 mg/dL (ref 7–25)
CO2: 27 mmol/L (ref 20–32)
Calcium: 9.7 mg/dL (ref 8.6–10.3)
Chloride: 107 mmol/L (ref 98–110)
Creat: 0.99 mg/dL (ref 0.70–1.28)
Globulin: 2 g/dL (ref 1.9–3.7)
Glucose, Bld: 101 mg/dL — ABNORMAL HIGH (ref 65–99)
Potassium: 4.2 mmol/L (ref 3.5–5.3)
Sodium: 143 mmol/L (ref 135–146)
Total Bilirubin: 0.7 mg/dL (ref 0.2–1.2)
Total Protein: 6.5 g/dL (ref 6.1–8.1)
eGFR: 78 mL/min/1.73m2

## 2024-07-10 LAB — SEDIMENTATION RATE: Sed Rate: 2 mm/h (ref 0–20)

## 2024-07-10 LAB — PROTEIN ELECTROPHORESIS, SERUM, WITH REFLEX
Albumin ELP: 4.5 g/dL (ref 3.8–4.8)
Alpha 1: 0.2 g/dL (ref 0.2–0.3)
Alpha 2: 0.6 g/dL (ref 0.5–0.9)
Beta 2: 0.3 g/dL (ref 0.2–0.5)
Beta Globulin: 0.5 g/dL (ref 0.4–0.6)
Gamma Globulin: 0.7 g/dL — ABNORMAL LOW (ref 0.8–1.7)
Total Protein: 6.8 g/dL (ref 6.1–8.1)

## 2024-07-10 LAB — IGG, IGA, IGM
IgG (Immunoglobin G), Serum: 776 mg/dL (ref 600–1540)
IgM, Serum: 47 mg/dL — ABNORMAL LOW (ref 50–300)
Immunoglobulin A: 153 mg/dL (ref 70–320)

## 2024-07-10 LAB — QUANTIFERON-TB GOLD PLUS
Mitogen-NIL: 7.43 [IU]/mL
NIL: 0.01 [IU]/mL
QuantiFERON-TB Gold Plus: NEGATIVE
TB1-NIL: 0.01 [IU]/mL
TB2-NIL: 0 [IU]/mL

## 2024-07-10 LAB — HEPATITIS C ANTIBODY: Hepatitis C Ab: NONREACTIVE

## 2024-07-10 LAB — IFE INTERPRETATION

## 2024-07-10 LAB — RHEUMATOID FACTOR: Rheumatoid fact SerPl-aCnc: <10 [IU]/mL

## 2024-07-10 LAB — CYCLIC CITRUL PEPTIDE ANTIBODY, IGG: Cyclic Citrullin Peptide Ab: <16 U

## 2024-07-10 LAB — HEPATITIS B SURFACE ANTIGEN: Hepatitis B Surface Ag: NONREACTIVE

## 2024-07-10 LAB — HEPATITIS B CORE ANTIBODY, IGM: Hep B C IgM: NONREACTIVE

## 2024-07-10 NOTE — Progress Notes (Signed)
 IFE normal.

## 2024-07-12 ENCOUNTER — Other Ambulatory Visit (HOSPITAL_COMMUNITY): Payer: Self-pay

## 2024-07-12 MED ORDER — METHOTREXATE SODIUM 2.5 MG PO TABS
20.0000 mg | ORAL_TABLET | ORAL | 0 refills | Status: AC
  Filled 2024-07-12 (×2): qty 96, 84d supply, fill #0

## 2024-07-12 MED ORDER — FOLIC ACID 1 MG PO TABS
2.0000 mg | ORAL_TABLET | Freq: Every day | ORAL | 3 refills | Status: AC
  Filled 2024-07-12 (×2): qty 180, 90d supply, fill #0

## 2024-07-12 NOTE — Telephone Encounter (Signed)
 Labs: 07/04/2024 CBC normal, CMP normal, sed rate normal, RF negative, anti-CCP negative, hepatitis B and hepatitis C nonreactive, TB Gold negative, IgM low which is not significant, IFE normal.    Please review and sign pended refills for methotrexate  and folic acid . Thanks!

## 2024-07-16 NOTE — Addendum Note (Signed)
 Addended by: DELPHINE BRUNO HERO on: 07/16/2024 12:45 PM   Modules accepted: Orders

## 2024-07-18 ENCOUNTER — Encounter: Admitting: Internal Medicine

## 2024-07-25 ENCOUNTER — Ambulatory Visit

## 2024-07-25 ENCOUNTER — Ambulatory Visit: Payer: Self-pay | Admitting: Cardiovascular Disease

## 2024-07-25 DIAGNOSIS — I442 Atrioventricular block, complete: Secondary | ICD-10-CM

## 2024-07-25 LAB — CUP PACEART REMOTE DEVICE CHECK
Battery Remaining Longevity: 115 mo
Battery Voltage: 3.01 V
Brady Statistic AP VP Percent: 34.91 %
Brady Statistic AP VS Percent: 0.04 %
Brady Statistic AS VP Percent: 64.43 %
Brady Statistic AS VS Percent: 0.61 %
Brady Statistic RA Percent Paced: 35.08 %
Brady Statistic RV Percent Paced: 99.34 %
Date Time Interrogation Session: 20260129025839
Implantable Lead Connection Status: 753985
Implantable Lead Connection Status: 753985
Implantable Lead Implant Date: 20230201
Implantable Lead Implant Date: 20230201
Implantable Lead Location: 753859
Implantable Lead Location: 753860
Implantable Lead Model: 3830
Implantable Lead Model: 5076
Implantable Pulse Generator Implant Date: 20230201
Lead Channel Impedance Value: 266 Ohm
Lead Channel Impedance Value: 323 Ohm
Lead Channel Impedance Value: 342 Ohm
Lead Channel Impedance Value: 494 Ohm
Lead Channel Pacing Threshold Amplitude: 0.5 V
Lead Channel Pacing Threshold Amplitude: 0.875 V
Lead Channel Pacing Threshold Pulse Width: 0.4 ms
Lead Channel Pacing Threshold Pulse Width: 0.4 ms
Lead Channel Sensing Intrinsic Amplitude: 19.75 mV
Lead Channel Sensing Intrinsic Amplitude: 19.75 mV
Lead Channel Sensing Intrinsic Amplitude: 3.375 mV
Lead Channel Sensing Intrinsic Amplitude: 3.375 mV
Lead Channel Setting Pacing Amplitude: 1.5 V
Lead Channel Setting Pacing Amplitude: 2 V
Lead Channel Setting Pacing Pulse Width: 0.4 ms
Lead Channel Setting Sensing Sensitivity: 0.9 mV
Zone Setting Status: 755011
Zone Setting Status: 755011

## 2024-07-30 ENCOUNTER — Ambulatory Visit: Admitting: Rheumatology

## 2024-07-30 ENCOUNTER — Encounter: Payer: Self-pay | Admitting: Rheumatology

## 2024-07-30 VITALS — BP 135/79 | HR 66 | Temp 97.8°F | Resp 14 | Ht 73.0 in | Wt 182.8 lb

## 2024-07-30 DIAGNOSIS — I4430 Unspecified atrioventricular block: Secondary | ICD-10-CM

## 2024-07-30 DIAGNOSIS — N200 Calculus of kidney: Secondary | ICD-10-CM

## 2024-07-30 DIAGNOSIS — M79642 Pain in left hand: Secondary | ICD-10-CM

## 2024-07-30 DIAGNOSIS — L409 Psoriasis, unspecified: Secondary | ICD-10-CM

## 2024-07-30 DIAGNOSIS — R413 Other amnesia: Secondary | ICD-10-CM

## 2024-07-30 DIAGNOSIS — M160 Bilateral primary osteoarthritis of hip: Secondary | ICD-10-CM

## 2024-07-30 DIAGNOSIS — M79671 Pain in right foot: Secondary | ICD-10-CM | POA: Diagnosis not present

## 2024-07-30 DIAGNOSIS — G8929 Other chronic pain: Secondary | ICD-10-CM

## 2024-07-30 DIAGNOSIS — Z950 Presence of cardiac pacemaker: Secondary | ICD-10-CM

## 2024-07-30 DIAGNOSIS — L4050 Arthropathic psoriasis, unspecified: Secondary | ICD-10-CM | POA: Diagnosis not present

## 2024-07-30 DIAGNOSIS — E1151 Type 2 diabetes mellitus with diabetic peripheral angiopathy without gangrene: Secondary | ICD-10-CM

## 2024-07-30 DIAGNOSIS — Z951 Presence of aortocoronary bypass graft: Secondary | ICD-10-CM | POA: Diagnosis not present

## 2024-07-30 DIAGNOSIS — F5101 Primary insomnia: Secondary | ICD-10-CM

## 2024-07-30 DIAGNOSIS — I2510 Atherosclerotic heart disease of native coronary artery without angina pectoris: Secondary | ICD-10-CM | POA: Diagnosis not present

## 2024-07-30 DIAGNOSIS — M5090 Cervical disc disorder, unspecified, unspecified cervical region: Secondary | ICD-10-CM | POA: Diagnosis not present

## 2024-07-30 DIAGNOSIS — M545 Low back pain, unspecified: Secondary | ICD-10-CM

## 2024-07-30 DIAGNOSIS — E782 Mixed hyperlipidemia: Secondary | ICD-10-CM

## 2024-07-30 DIAGNOSIS — M79641 Pain in right hand: Secondary | ICD-10-CM | POA: Diagnosis not present

## 2024-07-30 DIAGNOSIS — E538 Deficiency of other specified B group vitamins: Secondary | ICD-10-CM

## 2024-07-30 DIAGNOSIS — F330 Major depressive disorder, recurrent, mild: Secondary | ICD-10-CM

## 2024-07-30 DIAGNOSIS — Z840 Family history of diseases of the skin and subcutaneous tissue: Secondary | ICD-10-CM

## 2024-07-30 DIAGNOSIS — Z79899 Other long term (current) drug therapy: Secondary | ICD-10-CM | POA: Diagnosis not present

## 2024-07-30 DIAGNOSIS — N401 Enlarged prostate with lower urinary tract symptoms: Secondary | ICD-10-CM

## 2024-07-30 DIAGNOSIS — M79672 Pain in left foot: Secondary | ICD-10-CM

## 2024-07-30 NOTE — Patient Instructions (Signed)
 Standing Labs We placed an order today for your standing lab work.   Please have your standing labs drawn in April and every 3 months  Please have your labs drawn 2 weeks prior to your appointment so that the provider can discuss your lab results at your appointment, if possible.  Please note that you may see your imaging and lab results in MyChart before we have reviewed them. We will contact you once all results are reviewed. Please allow our office up to 72 hours to thoroughly review all of the results before contacting the office for clarification of your results.  WALK-IN LAB HOURS  Monday through Thursday from 8:00 am - 4:00 pm and Friday from 8:00 am-12:00 pm.  Patients with office visits requiring labs will be seen before walk-in labs.  You may encounter longer than normal wait times. Please allow additional time. Wait times may be shorter on  Monday and Thursday afternoons.  We do not book appointments for walk-in labs. We appreciate your patience and understanding with our staff.   Labs are drawn by [facility]. Please bring your co-pay at the time of your lab draw.  You may receive a bill from [facility] for your lab work.  Please note if you are on Hydroxychloroquine and and an order has been placed for a Hydroxychloroquine level,  you will need to have it drawn 4 hours or more after your last dose.  If you wish to have your labs drawn at another location, please call the office 24 hours in advance so we can fax the orders.  The office is located at [address], [address], [place], [place] [address]   If you have any questions regarding directions or hours of operation,  please call [phone].   As a reminder, please drink plenty of water  prior to coming for your lab work. Thanks!   Vaccines You are taking a medication(s) that can suppress your immune system.  The following immunizations are recommended: Flu annually Covid-19  RSV Td/Tdap (tetanus, diphtheria, pertussis)  every 10 years Pneumonia (Prevnar 15 then Pneumovax 23 at least 1 year apart.  Alternatively, can take Prevnar 20 without needing additional dose) Shingrix: 2 doses from 4 weeks to 6 months apart  Please check with your PCP to make sure you are up to date.   If you have signs or symptoms of an infection or start antibiotics: First, call your PCP for workup of your infection. Hold your medication through the infection, until you complete your antibiotics, and until symptoms resolve if you take the following: Injectable medication (Actemra, Benlysta, Cimzia, Cosentyx, Enbrel, Humira, Kevzara, Orencia, Remicade, Simponi, Stelara, Taltz, Tremfya) Methotrexate Leflunomide (Arava) Mycophenolate (Cellcept) Earma, Olumiant, or Rinvoq

## 2024-07-31 LAB — GENECONNECT MOLECULAR SCREEN: Genetic Analysis Overall Interpretation: NEGATIVE

## 2024-08-01 ENCOUNTER — Ambulatory Visit: Admitting: Rheumatology

## 2024-08-01 NOTE — Progress Notes (Signed)
 Remote PPM Transmission

## 2024-08-12 ENCOUNTER — Ambulatory Visit: Admitting: Physician Assistant

## 2024-08-26 ENCOUNTER — Ambulatory Visit: Admitting: Internal Medicine

## 2024-10-24 ENCOUNTER — Encounter

## 2024-11-26 ENCOUNTER — Ambulatory Visit: Admitting: Internal Medicine

## 2024-12-06 ENCOUNTER — Encounter: Admitting: Internal Medicine

## 2025-01-01 ENCOUNTER — Ambulatory Visit: Admitting: Rheumatology

## 2025-01-23 ENCOUNTER — Encounter

## 2025-04-14 ENCOUNTER — Ambulatory Visit

## 2025-04-24 ENCOUNTER — Encounter
